# Patient Record
Sex: Female | Born: 1950 | Race: White | Hispanic: No | Marital: Married | Smoking: Never smoker
Health system: Southern US, Community
[De-identification: ages and names within clinical notes are randomized; demographics above are authoritative.]

## PROBLEM LIST (undated history)

## (undated) DIAGNOSIS — N3946 Mixed incontinence: Secondary | ICD-10-CM

## (undated) DIAGNOSIS — C801 Malignant (primary) neoplasm, unspecified: Secondary | ICD-10-CM

## (undated) DIAGNOSIS — E785 Hyperlipidemia, unspecified: Secondary | ICD-10-CM

## (undated) DIAGNOSIS — B009 Herpesviral infection, unspecified: Secondary | ICD-10-CM

## (undated) DIAGNOSIS — D649 Anemia, unspecified: Secondary | ICD-10-CM

## (undated) DIAGNOSIS — G709 Myoneural disorder, unspecified: Secondary | ICD-10-CM

## (undated) DIAGNOSIS — T7840XA Allergy, unspecified, initial encounter: Secondary | ICD-10-CM

## (undated) DIAGNOSIS — I10 Essential (primary) hypertension: Secondary | ICD-10-CM

## (undated) DIAGNOSIS — Z87442 Personal history of urinary calculi: Secondary | ICD-10-CM

## (undated) DIAGNOSIS — Z8719 Personal history of other diseases of the digestive system: Secondary | ICD-10-CM

## (undated) DIAGNOSIS — N201 Calculus of ureter: Secondary | ICD-10-CM

## (undated) DIAGNOSIS — D72829 Elevated white blood cell count, unspecified: Secondary | ICD-10-CM

## (undated) DIAGNOSIS — E0590 Thyrotoxicosis, unspecified without thyrotoxic crisis or storm: Secondary | ICD-10-CM

## (undated) DIAGNOSIS — N281 Cyst of kidney, acquired: Secondary | ICD-10-CM

## (undated) HISTORY — DX: Mixed incontinence: N39.46

## (undated) HISTORY — DX: Elevated white blood cell count, unspecified: D72.829

## (undated) HISTORY — DX: Essential (primary) hypertension: I10

## (undated) HISTORY — DX: Herpesviral infection, unspecified: B00.9

## (undated) HISTORY — DX: Myoneural disorder, unspecified: G70.9

## (undated) HISTORY — PX: SPINE SURGERY: SHX786

## (undated) HISTORY — DX: Thyrotoxicosis, unspecified without thyrotoxic crisis or storm: E05.90

## (undated) HISTORY — DX: Malignant (primary) neoplasm, unspecified: C80.1

## (undated) HISTORY — DX: Hyperlipidemia, unspecified: E78.5

## (undated) HISTORY — DX: Allergy, unspecified, initial encounter: T78.40XA

## (undated) HISTORY — DX: Personal history of urinary calculi: Z87.442

## (undated) HISTORY — PX: COLONOSCOPY: SHX174

## (undated) SURGERY — CYSTOSCOPY, WITH CALCULUS REMOVAL USING BASKET
Anesthesia: Choice | Laterality: Right

---
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
## 1985-12-09 HISTORY — PX: VAGINAL HYSTERECTOMY: SUR661

## 1992-12-09 HISTORY — PX: BREAST REDUCTION SURGERY: SHX8

## 1993-12-09 HISTORY — PX: REDUCTION MAMMAPLASTY: SUR839

## 1998-12-09 HISTORY — PX: CERVICAL FUSION: SHX112

## 1999-04-25 ENCOUNTER — Encounter: Payer: Self-pay | Admitting: Emergency Medicine

## 1999-04-25 ENCOUNTER — Emergency Department (HOSPITAL_COMMUNITY): Admission: EM | Admit: 1999-04-25 | Discharge: 1999-04-25 | Payer: Self-pay | Admitting: Emergency Medicine

## 1999-06-14 ENCOUNTER — Inpatient Hospital Stay (HOSPITAL_COMMUNITY): Admission: RE | Admit: 1999-06-14 | Discharge: 1999-06-14 | Payer: Self-pay | Admitting: Neurological Surgery

## 1999-06-14 ENCOUNTER — Encounter: Payer: Self-pay | Admitting: Neurological Surgery

## 1999-12-14 ENCOUNTER — Encounter: Payer: Self-pay | Admitting: Family Medicine

## 1999-12-14 ENCOUNTER — Ambulatory Visit (HOSPITAL_COMMUNITY): Admission: RE | Admit: 1999-12-14 | Discharge: 1999-12-14 | Payer: Self-pay | Admitting: Family Medicine

## 2000-05-23 ENCOUNTER — Other Ambulatory Visit: Admission: RE | Admit: 2000-05-23 | Discharge: 2000-05-23 | Payer: Self-pay | Admitting: Family Medicine

## 2001-07-06 ENCOUNTER — Encounter: Payer: Self-pay | Admitting: Family Medicine

## 2001-07-06 ENCOUNTER — Ambulatory Visit (HOSPITAL_COMMUNITY): Admission: RE | Admit: 2001-07-06 | Discharge: 2001-07-06 | Payer: Self-pay | Admitting: Family Medicine

## 2001-07-13 LAB — FECAL OCCULT BLOOD, GUAIAC: Fecal Occult Blood: NEGATIVE

## 2002-07-28 ENCOUNTER — Ambulatory Visit (HOSPITAL_COMMUNITY): Admission: RE | Admit: 2002-07-28 | Discharge: 2002-07-28 | Payer: Self-pay | Admitting: Family Medicine

## 2002-07-28 ENCOUNTER — Encounter: Payer: Self-pay | Admitting: Family Medicine

## 2003-08-29 ENCOUNTER — Ambulatory Visit (HOSPITAL_COMMUNITY): Admission: RE | Admit: 2003-08-29 | Discharge: 2003-08-29 | Payer: Self-pay | Admitting: Family Medicine

## 2003-08-29 ENCOUNTER — Encounter: Payer: Self-pay | Admitting: Family Medicine

## 2003-12-10 HISTORY — PX: CERVICAL FUSION: SHX112

## 2004-04-26 ENCOUNTER — Ambulatory Visit (HOSPITAL_COMMUNITY): Admission: RE | Admit: 2004-04-26 | Discharge: 2004-04-26 | Payer: Self-pay | Admitting: Family Medicine

## 2004-05-15 ENCOUNTER — Ambulatory Visit (HOSPITAL_COMMUNITY): Admission: RE | Admit: 2004-05-15 | Discharge: 2004-05-16 | Payer: Self-pay | Admitting: Neurological Surgery

## 2004-08-20 ENCOUNTER — Other Ambulatory Visit: Admission: RE | Admit: 2004-08-20 | Discharge: 2004-08-20 | Payer: Self-pay | Admitting: Internal Medicine

## 2004-08-20 ENCOUNTER — Encounter: Payer: Self-pay | Admitting: Family Medicine

## 2004-08-20 LAB — CONVERTED CEMR LAB: Pap Smear: NORMAL

## 2004-12-20 ENCOUNTER — Ambulatory Visit: Payer: Self-pay | Admitting: Family Medicine

## 2004-12-21 ENCOUNTER — Encounter: Payer: Self-pay | Admitting: Cardiology

## 2004-12-21 ENCOUNTER — Inpatient Hospital Stay (HOSPITAL_COMMUNITY): Admission: EM | Admit: 2004-12-21 | Discharge: 2004-12-24 | Payer: Self-pay | Admitting: Podiatry

## 2004-12-21 ENCOUNTER — Ambulatory Visit: Payer: Self-pay | Admitting: Internal Medicine

## 2004-12-27 ENCOUNTER — Ambulatory Visit: Payer: Self-pay | Admitting: Family Medicine

## 2005-02-27 ENCOUNTER — Ambulatory Visit: Payer: Self-pay | Admitting: Family Medicine

## 2005-08-27 ENCOUNTER — Ambulatory Visit: Payer: Self-pay | Admitting: Family Medicine

## 2005-09-02 ENCOUNTER — Ambulatory Visit (HOSPITAL_COMMUNITY): Admission: RE | Admit: 2005-09-02 | Discharge: 2005-09-02 | Payer: Self-pay | Admitting: Family Medicine

## 2005-10-15 ENCOUNTER — Ambulatory Visit: Payer: Self-pay | Admitting: Family Medicine

## 2006-10-21 ENCOUNTER — Ambulatory Visit: Payer: Self-pay | Admitting: Family Medicine

## 2006-10-22 ENCOUNTER — Ambulatory Visit (HOSPITAL_COMMUNITY): Admission: RE | Admit: 2006-10-22 | Discharge: 2006-10-22 | Payer: Self-pay | Admitting: Family Medicine

## 2006-10-28 ENCOUNTER — Ambulatory Visit: Payer: Self-pay | Admitting: Family Medicine

## 2006-12-24 ENCOUNTER — Ambulatory Visit: Payer: Self-pay | Admitting: Gastroenterology

## 2007-01-09 ENCOUNTER — Ambulatory Visit: Payer: Self-pay | Admitting: Gastroenterology

## 2007-01-09 LAB — HM COLONOSCOPY: HM Colonoscopy: NORMAL

## 2007-02-12 ENCOUNTER — Ambulatory Visit: Payer: Self-pay | Admitting: Family Medicine

## 2007-02-12 LAB — CONVERTED CEMR LAB
Eosinophils Absolute: 0.3 10*3/uL (ref 0.0–0.6)
Eosinophils Relative: 3.6 % (ref 0.0–5.0)
HCT: 33.8 % — ABNORMAL LOW (ref 36.0–46.0)
Lymphocytes Relative: 30.4 % (ref 12.0–46.0)
MCV: 89.1 fL (ref 78.0–100.0)
Monocytes Absolute: 0.4 10*3/uL (ref 0.2–0.7)
Neutro Abs: 4.6 10*3/uL (ref 1.4–7.7)
Neutrophils Relative %: 59.5 % (ref 43.0–77.0)
WBC: 7.7 10*3/uL (ref 4.5–10.5)

## 2007-03-19 ENCOUNTER — Inpatient Hospital Stay (HOSPITAL_COMMUNITY): Admission: EM | Admit: 2007-03-19 | Discharge: 2007-03-21 | Payer: Self-pay | Admitting: Emergency Medicine

## 2007-03-19 ENCOUNTER — Ambulatory Visit: Payer: Self-pay | Admitting: Internal Medicine

## 2007-03-22 ENCOUNTER — Emergency Department (HOSPITAL_COMMUNITY): Admission: EM | Admit: 2007-03-22 | Discharge: 2007-03-22 | Payer: Self-pay | Admitting: Emergency Medicine

## 2007-03-22 ENCOUNTER — Ambulatory Visit: Payer: Self-pay | Admitting: Psychiatry

## 2007-03-22 ENCOUNTER — Inpatient Hospital Stay (HOSPITAL_COMMUNITY): Admission: AD | Admit: 2007-03-22 | Discharge: 2007-03-25 | Payer: Self-pay | Admitting: Psychiatry

## 2007-03-27 ENCOUNTER — Encounter: Payer: Self-pay | Admitting: Family Medicine

## 2007-03-30 ENCOUNTER — Ambulatory Visit: Payer: Self-pay | Admitting: Endocrinology

## 2007-03-31 ENCOUNTER — Ambulatory Visit: Payer: Self-pay | Admitting: Family Medicine

## 2007-03-31 LAB — CONVERTED CEMR LAB
AST: 26 units/L (ref 0–37)
BUN: 13 mg/dL (ref 6–23)
Basophils Absolute: 0 10*3/uL (ref 0.0–0.1)
Calcium: 9.3 mg/dL (ref 8.4–10.5)
Eosinophils Absolute: 0.2 10*3/uL (ref 0.0–0.6)
GFR calc Af Amer: 83 mL/min
Glucose, Bld: 125 mg/dL — ABNORMAL HIGH (ref 70–99)
MCHC: 34.6 g/dL (ref 30.0–36.0)
MCV: 89.2 fL (ref 78.0–100.0)
Neutrophils Relative %: 75.3 % (ref 43.0–77.0)
Platelets: 784 10*3/uL — ABNORMAL HIGH (ref 150–400)
Potassium: 3.3 meq/L — ABNORMAL LOW (ref 3.5–5.1)
RBC: 3.98 M/uL (ref 3.87–5.11)

## 2007-04-02 ENCOUNTER — Ambulatory Visit: Payer: Self-pay | Admitting: Family Medicine

## 2007-04-03 ENCOUNTER — Encounter: Payer: Self-pay | Admitting: Family Medicine

## 2007-04-04 ENCOUNTER — Emergency Department (HOSPITAL_COMMUNITY): Admission: EM | Admit: 2007-04-04 | Discharge: 2007-04-05 | Payer: Self-pay | Admitting: Emergency Medicine

## 2007-04-06 ENCOUNTER — Ambulatory Visit: Payer: Self-pay | Admitting: Family Medicine

## 2007-04-06 ENCOUNTER — Telehealth: Payer: Self-pay | Admitting: Family Medicine

## 2007-04-06 LAB — CONVERTED CEMR LAB
ALT: 26 units/L (ref 0–40)
Albumin: 3.4 g/dL — ABNORMAL LOW (ref 3.5–5.2)
Alkaline Phosphatase: 80 units/L (ref 39–117)
BUN: 12 mg/dL (ref 6–23)
Basophils Absolute: 0.1 10*3/uL (ref 0.0–0.1)
Basophils Relative: 1.2 % — ABNORMAL HIGH (ref 0.0–1.0)
CO2: 28 meq/L (ref 19–32)
CRP, High Sensitivity: 27 — ABNORMAL HIGH (ref 0.00–5.00)
Calcium: 9 mg/dL (ref 8.4–10.5)
Eosinophils Absolute: 0.2 10*3/uL (ref 0.0–0.6)
Ferritin: 171.4 ng/mL (ref 10.0–291.0)
GFR calc Af Amer: 66 mL/min
GFR calc non Af Amer: 55 mL/min
HCV Ab: NEGATIVE
Hep A IgM: NEGATIVE
Hepatitis B Surface Ag: NEGATIVE
Lymphocytes Relative: 21.7 % (ref 12.0–46.0)
MCHC: 34.5 g/dL (ref 30.0–36.0)
Monocytes Relative: 7.6 % (ref 3.0–11.0)
Neutro Abs: 8 10*3/uL — ABNORMAL HIGH (ref 1.4–7.7)
Platelets: 589 10*3/uL — ABNORMAL HIGH (ref 150–400)

## 2007-04-09 ENCOUNTER — Telehealth: Payer: Self-pay | Admitting: Family Medicine

## 2007-04-09 DIAGNOSIS — D473 Essential (hemorrhagic) thrombocythemia: Secondary | ICD-10-CM

## 2007-04-09 DIAGNOSIS — D72829 Elevated white blood cell count, unspecified: Secondary | ICD-10-CM | POA: Insufficient documentation

## 2007-04-10 ENCOUNTER — Ambulatory Visit: Payer: Self-pay | Admitting: Oncology

## 2007-04-10 ENCOUNTER — Encounter: Payer: Self-pay | Admitting: Family Medicine

## 2007-04-14 ENCOUNTER — Encounter: Payer: Self-pay | Admitting: Family Medicine

## 2007-04-14 ENCOUNTER — Other Ambulatory Visit: Admission: RE | Admit: 2007-04-14 | Discharge: 2007-04-14 | Payer: Self-pay | Admitting: Oncology

## 2007-04-14 ENCOUNTER — Encounter: Payer: Self-pay | Admitting: Oncology

## 2007-04-14 LAB — CBC WITH DIFFERENTIAL (CANCER CENTER ONLY)
BASO%: 0.5 % (ref 0.0–2.0)
EOS%: 2 % (ref 0.0–7.0)
LYMPH%: 27.4 % (ref 14.0–48.0)
MCHC: 33.5 g/dL (ref 32.0–36.0)
MCV: 90 fL (ref 81–101)
MONO#: 0.5 10*3/uL (ref 0.1–0.9)
Platelets: 543 10*3/uL — ABNORMAL HIGH (ref 145–400)
RDW: 11.5 % (ref 10.5–14.6)
WBC: 9.3 10*3/uL (ref 3.9–10.0)

## 2007-04-14 LAB — MORPHOLOGY - CHCC SATELLITE: PLT EST ~~LOC~~: 714

## 2007-04-16 ENCOUNTER — Ambulatory Visit: Payer: Self-pay | Admitting: Family Medicine

## 2007-04-16 DIAGNOSIS — E876 Hypokalemia: Secondary | ICD-10-CM | POA: Insufficient documentation

## 2007-04-16 DIAGNOSIS — D649 Anemia, unspecified: Secondary | ICD-10-CM | POA: Insufficient documentation

## 2007-04-16 LAB — IRON AND TIBC
TIBC: 335 ug/dL (ref 250–470)
UIBC: 257 ug/dL

## 2007-04-16 LAB — CONVERTED CEMR LAB
Basophils Absolute: 0.1 10*3/uL (ref 0.0–0.1)
Eosinophils Absolute: 0.2 10*3/uL (ref 0.0–0.6)
MCHC: 35.1 g/dL (ref 30.0–36.0)
MCV: 89.7 fL (ref 78.0–100.0)
Monocytes Absolute: 0.5 10*3/uL (ref 0.2–0.7)
Monocytes Relative: 5.8 % (ref 3.0–11.0)
Potassium: 4.5 meq/L (ref 3.5–5.1)
RBC: 3.66 M/uL — ABNORMAL LOW (ref 3.87–5.11)
RDW: 13.2 % (ref 11.5–14.6)

## 2007-04-16 LAB — COMPREHENSIVE METABOLIC PANEL
ALT: 22 U/L (ref 0–35)
AST: 19 U/L (ref 0–37)
Albumin: 3.3 g/dL — ABNORMAL LOW (ref 3.5–5.2)
Alkaline Phosphatase: 87 U/L (ref 39–117)
Glucose, Bld: 120 mg/dL — ABNORMAL HIGH (ref 70–99)
Potassium: 4.4 mEq/L (ref 3.5–5.3)
Sodium: 141 mEq/L (ref 135–145)
Total Protein: 5.9 g/dL — ABNORMAL LOW (ref 6.0–8.3)

## 2007-04-16 LAB — RETICULOCYTES (CHCC)
ABS Retic: 76.4 10*3/uL (ref 19.0–186.0)
RBC.: 3.82 MIL/uL — ABNORMAL LOW (ref 3.87–5.11)

## 2007-05-06 LAB — CBC WITH DIFFERENTIAL (CANCER CENTER ONLY)
BASO#: 0.1 10*3/uL (ref 0.0–0.2)
EOS%: 4.1 % (ref 0.0–7.0)
HCT: 33.3 % — ABNORMAL LOW (ref 34.8–46.6)
HGB: 11.2 g/dL — ABNORMAL LOW (ref 11.6–15.9)
LYMPH#: 3.5 10*3/uL — ABNORMAL HIGH (ref 0.9–3.3)
MONO#: 0.4 10*3/uL (ref 0.1–0.9)
NEUT#: 3.7 10*3/uL (ref 1.5–6.5)
NEUT%: 46.3 % (ref 39.6–80.0)
RBC: 3.8 10*6/uL (ref 3.70–5.32)
WBC: 7.9 10*3/uL (ref 3.9–10.0)

## 2007-08-11 ENCOUNTER — Ambulatory Visit: Payer: Self-pay | Admitting: Oncology

## 2007-08-12 ENCOUNTER — Encounter: Payer: Self-pay | Admitting: Family Medicine

## 2007-08-12 LAB — CBC WITH DIFFERENTIAL (CANCER CENTER ONLY)
BASO%: 0.8 % (ref 0.0–2.0)
EOS%: 3.6 % (ref 0.0–7.0)
HCT: 36.4 % (ref 34.8–46.6)
LYMPH#: 3.3 10*3/uL (ref 0.9–3.3)
MCHC: 33.8 g/dL (ref 32.0–36.0)
MONO#: 0.5 10*3/uL (ref 0.1–0.9)
NEUT#: 4.1 10*3/uL (ref 1.5–6.5)
NEUT%: 49.9 % (ref 39.6–80.0)
Platelets: 490 10*3/uL — ABNORMAL HIGH (ref 145–400)
RDW: 11.8 % (ref 10.5–14.6)
WBC: 8.2 10*3/uL (ref 3.9–10.0)

## 2007-09-23 ENCOUNTER — Encounter: Payer: Self-pay | Admitting: Family Medicine

## 2007-09-23 DIAGNOSIS — E0590 Thyrotoxicosis, unspecified without thyrotoxic crisis or storm: Secondary | ICD-10-CM

## 2007-09-23 DIAGNOSIS — J45909 Unspecified asthma, uncomplicated: Secondary | ICD-10-CM | POA: Insufficient documentation

## 2007-09-23 DIAGNOSIS — E781 Pure hyperglyceridemia: Secondary | ICD-10-CM | POA: Insufficient documentation

## 2007-09-23 DIAGNOSIS — B009 Herpesviral infection, unspecified: Secondary | ICD-10-CM

## 2007-09-23 DIAGNOSIS — J309 Allergic rhinitis, unspecified: Secondary | ICD-10-CM | POA: Insufficient documentation

## 2007-09-23 DIAGNOSIS — I10 Essential (primary) hypertension: Secondary | ICD-10-CM

## 2007-10-28 ENCOUNTER — Ambulatory Visit: Payer: Self-pay | Admitting: Family Medicine

## 2007-10-30 LAB — CONVERTED CEMR LAB
ALT: 19 units/L (ref 0–35)
AST: 21 units/L (ref 0–37)
Albumin: 3.9 g/dL (ref 3.5–5.2)
Calcium: 9.7 mg/dL (ref 8.4–10.5)
Chloride: 100 meq/L (ref 96–112)
Cholesterol: 236 mg/dL (ref 0–200)
Creatinine, Ser: 0.9 mg/dL (ref 0.4–1.2)
Direct LDL: 124.3 mg/dL
GFR calc non Af Amer: 69 mL/min
Sodium: 137 meq/L (ref 135–145)
Total Bilirubin: 0.9 mg/dL (ref 0.3–1.2)
VLDL: 71 mg/dL — ABNORMAL HIGH (ref 0–40)

## 2007-11-13 ENCOUNTER — Encounter: Payer: Self-pay | Admitting: Family Medicine

## 2007-11-13 ENCOUNTER — Ambulatory Visit (HOSPITAL_COMMUNITY): Admission: RE | Admit: 2007-11-13 | Discharge: 2007-11-13 | Payer: Self-pay | Admitting: Family Medicine

## 2007-11-17 ENCOUNTER — Encounter (INDEPENDENT_AMBULATORY_CARE_PROVIDER_SITE_OTHER): Payer: Self-pay | Admitting: *Deleted

## 2007-11-23 ENCOUNTER — Encounter (INDEPENDENT_AMBULATORY_CARE_PROVIDER_SITE_OTHER): Payer: Self-pay | Admitting: *Deleted

## 2007-12-22 ENCOUNTER — Encounter: Payer: Self-pay | Admitting: Family Medicine

## 2007-12-23 ENCOUNTER — Ambulatory Visit: Payer: Self-pay | Admitting: Family Medicine

## 2008-03-21 ENCOUNTER — Ambulatory Visit: Payer: Self-pay | Admitting: Oncology

## 2008-03-22 ENCOUNTER — Encounter: Payer: Self-pay | Admitting: Family Medicine

## 2008-03-22 LAB — CBC WITH DIFFERENTIAL (CANCER CENTER ONLY)
BASO%: 0.6 % (ref 0.0–2.0)
Eosinophils Absolute: 0.3 10*3/uL (ref 0.0–0.5)
HCT: 36.5 % (ref 34.8–46.6)
LYMPH%: 33.3 % (ref 14.0–48.0)
MCH: 28.8 pg (ref 26.0–34.0)
MCV: 84 fL (ref 81–101)
MONO%: 4.9 % (ref 0.0–13.0)
NEUT%: 57.4 % (ref 39.6–80.0)
Platelets: 402 10*3/uL — ABNORMAL HIGH (ref 145–400)
RDW: 12.2 % (ref 10.5–14.6)

## 2008-06-20 ENCOUNTER — Encounter: Payer: Self-pay | Admitting: Family Medicine

## 2008-08-08 ENCOUNTER — Encounter: Payer: Self-pay | Admitting: Family Medicine

## 2008-08-25 ENCOUNTER — Encounter (INDEPENDENT_AMBULATORY_CARE_PROVIDER_SITE_OTHER): Payer: Self-pay | Admitting: *Deleted

## 2008-11-08 ENCOUNTER — Ambulatory Visit: Payer: Self-pay | Admitting: Family Medicine

## 2008-11-08 DIAGNOSIS — E039 Hypothyroidism, unspecified: Secondary | ICD-10-CM

## 2008-11-11 LAB — CONVERTED CEMR LAB
ALT: 17 units/L (ref 0–35)
AST: 24 units/L (ref 0–37)
Albumin: 3.7 g/dL (ref 3.5–5.2)
Alkaline Phosphatase: 67 units/L (ref 39–117)
BUN: 16 mg/dL (ref 6–23)
Basophils Relative: 0.4 % (ref 0.0–3.0)
CO2: 27 meq/L (ref 19–32)
Chloride: 103 meq/L (ref 96–112)
Creatinine, Ser: 0.8 mg/dL (ref 0.4–1.2)
Direct LDL: 110.4 mg/dL
Eosinophils Absolute: 0.2 10*3/uL (ref 0.0–0.7)
Eosinophils Relative: 2.8 % (ref 0.0–5.0)
GFR calc non Af Amer: 79 mL/min
Glucose, Bld: 92 mg/dL (ref 70–99)
HDL: 63.2 mg/dL (ref >39.0)
Lymphocytes Relative: 28.2 % (ref 12.0–46.0)
MCV: 88 fL (ref 78.0–100.0)
Monocytes Relative: 4.9 % (ref 3.0–12.0)
Neutrophils Relative %: 63.7 % (ref 43.0–77.0)
Platelets: 441 10*3/uL — ABNORMAL HIGH (ref 150–400)
Potassium: 3.7 meq/L (ref 3.5–5.1)
RBC: 4.02 M/uL (ref 3.87–5.11)
Total CHOL/HDL Ratio: 3.9
Triglycerides: 488 mg/dL (ref 0–149)
VLDL: 98 mg/dL — ABNORMAL HIGH (ref 0–40)
WBC: 8.1 10*3/uL (ref 4.5–10.5)

## 2008-11-22 ENCOUNTER — Ambulatory Visit (HOSPITAL_COMMUNITY): Admission: RE | Admit: 2008-11-22 | Discharge: 2008-11-22 | Payer: Self-pay | Admitting: Family Medicine

## 2008-11-23 ENCOUNTER — Encounter (INDEPENDENT_AMBULATORY_CARE_PROVIDER_SITE_OTHER): Payer: Self-pay | Admitting: *Deleted

## 2008-12-22 ENCOUNTER — Encounter: Payer: Self-pay | Admitting: Family Medicine

## 2009-02-07 ENCOUNTER — Ambulatory Visit: Payer: Self-pay | Admitting: Family Medicine

## 2009-10-30 ENCOUNTER — Telehealth (INDEPENDENT_AMBULATORY_CARE_PROVIDER_SITE_OTHER): Payer: Self-pay | Admitting: *Deleted

## 2009-11-09 ENCOUNTER — Ambulatory Visit: Payer: Self-pay | Admitting: Family Medicine

## 2009-11-10 LAB — CONVERTED CEMR LAB
ALT: 20 units/L (ref 0–35)
AST: 21 units/L (ref 0–37)
Albumin: 4.1 g/dL (ref 3.5–5.2)
Chloride: 100 meq/L (ref 96–112)
Direct LDL: 109.9 mg/dL
Eosinophils Relative: 2.6 % (ref 0.0–5.0)
GFR calc non Af Amer: 44.57 mL/min (ref >60)
Glucose, Bld: 99 mg/dL (ref 70–99)
HCT: 38.6 % (ref 36.0–46.0)
HDL: 54.2 mg/dL (ref >39.00)
Hemoglobin: 13.2 g/dL (ref 12.0–15.0)
Lymphs Abs: 2.4 10*3/uL (ref 0.7–4.0)
MCV: 87.5 fL (ref 78.0–100.0)
Monocytes Absolute: 0.5 10*3/uL (ref 0.1–1.0)
Monocytes Relative: 6 % (ref 3.0–12.0)
Neutro Abs: 5 10*3/uL (ref 1.4–7.7)
Potassium: 3.6 meq/L (ref 3.5–5.1)
Sodium: 137 meq/L (ref 135–145)
Total Protein: 7.5 g/dL (ref 6.0–8.3)
VLDL: 49.4 mg/dL — ABNORMAL HIGH (ref 0.0–40.0)
WBC: 8.2 10*3/uL (ref 4.5–10.5)

## 2009-11-23 ENCOUNTER — Ambulatory Visit (HOSPITAL_COMMUNITY): Admission: RE | Admit: 2009-11-23 | Discharge: 2009-11-23 | Payer: Self-pay | Admitting: Family Medicine

## 2009-11-27 ENCOUNTER — Encounter (INDEPENDENT_AMBULATORY_CARE_PROVIDER_SITE_OTHER): Payer: Self-pay | Admitting: *Deleted

## 2009-12-14 ENCOUNTER — Ambulatory Visit: Payer: Self-pay | Admitting: Family Medicine

## 2009-12-25 ENCOUNTER — Telehealth: Payer: Self-pay | Admitting: Family Medicine

## 2009-12-29 ENCOUNTER — Telehealth: Payer: Self-pay | Admitting: Family Medicine

## 2010-01-23 ENCOUNTER — Telehealth: Payer: Self-pay | Admitting: Family Medicine

## 2010-09-04 ENCOUNTER — Telehealth: Payer: Self-pay | Admitting: Family Medicine

## 2010-11-05 ENCOUNTER — Encounter: Payer: Self-pay | Admitting: Family Medicine

## 2010-11-09 ENCOUNTER — Telehealth (INDEPENDENT_AMBULATORY_CARE_PROVIDER_SITE_OTHER): Payer: Self-pay | Admitting: *Deleted

## 2010-11-12 ENCOUNTER — Ambulatory Visit: Payer: Self-pay | Admitting: Family Medicine

## 2010-11-13 LAB — CONVERTED CEMR LAB
AST: 25 units/L (ref 0–37)
Alkaline Phosphatase: 92 units/L (ref 39–117)
Bilirubin, Direct: 0.1 mg/dL (ref 0.0–0.3)
CO2: 27 meq/L (ref 19–32)
Calcium: 9.7 mg/dL (ref 8.4–10.5)
Creatinine, Ser: 1.1 mg/dL (ref 0.4–1.2)
Direct LDL: 143.1 mg/dL
Eosinophils Relative: 2.9 % (ref 0.0–5.0)
Glucose, Bld: 95 mg/dL (ref 70–99)
HCT: 37.2 % (ref 36.0–46.0)
Lymphs Abs: 3.5 10*3/uL (ref 0.7–4.0)
MCHC: 34.5 g/dL (ref 30.0–36.0)
MCV: 89.4 fL (ref 78.0–100.0)
Monocytes Absolute: 0.6 10*3/uL (ref 0.1–1.0)
Platelets: 483 10*3/uL — ABNORMAL HIGH (ref 150.0–400.0)
RDW: 12 % (ref 11.5–14.6)
TSH: 7.88 microintl units/mL — ABNORMAL HIGH (ref 0.35–5.50)
Total Bilirubin: 0.9 mg/dL (ref 0.3–1.2)
Total CHOL/HDL Ratio: 4
WBC: 9.9 10*3/uL (ref 4.5–10.5)

## 2010-12-11 ENCOUNTER — Ambulatory Visit
Admission: RE | Admit: 2010-12-11 | Discharge: 2010-12-11 | Payer: Self-pay | Source: Home / Self Care | Attending: Family Medicine | Admitting: Family Medicine

## 2010-12-11 DIAGNOSIS — N3946 Mixed incontinence: Secondary | ICD-10-CM | POA: Insufficient documentation

## 2010-12-19 ENCOUNTER — Telehealth: Payer: Self-pay | Admitting: Family Medicine

## 2010-12-19 ENCOUNTER — Ambulatory Visit (HOSPITAL_COMMUNITY)
Admission: RE | Admit: 2010-12-19 | Discharge: 2010-12-19 | Payer: Self-pay | Source: Home / Self Care | Attending: Family Medicine | Admitting: Family Medicine

## 2010-12-20 ENCOUNTER — Encounter: Payer: Self-pay | Admitting: Family Medicine

## 2010-12-24 ENCOUNTER — Encounter (INDEPENDENT_AMBULATORY_CARE_PROVIDER_SITE_OTHER): Payer: Self-pay | Admitting: *Deleted

## 2010-12-31 ENCOUNTER — Telehealth: Payer: Self-pay | Admitting: Family Medicine

## 2011-01-01 ENCOUNTER — Encounter: Payer: Self-pay | Admitting: Family Medicine

## 2011-01-08 NOTE — Progress Notes (Signed)
----   Converted from flag ---- ---- 11/08/2010 9:47 PM, Colon Flattery Tower MD wrote: please check lipid/hepatic/ renal/ cbc with diff and tsh for 272, 401.1 and 244.9 and anemia  thanks  ---- 11/07/2010 10:50 AM, Liane Comber CMA (AAMA) wrote: Lab orders please! Good Morning! This pt is scheduled for cpx labs Monday, which labs to draw and dx codes to use? Thanks Tasha ------------------------------

## 2011-01-08 NOTE — Progress Notes (Signed)
Summary: Rx Avalide backordered  Phone Note Refill Request Call back at [phone] Message from:  [facility] on December 25, 2009 10:00 AM  Refills Requested: Medication #1:  AVALIDE 150-12.5 MG  TABS one by mouth once daily Received faxed form form [facility].  All strengths of Avalide are back ordered.  Please advise.  Alternate medication?  Call Rx in to local pharmacy?  Form in your IN box   Method Requested: Fax to Mail Away Pharmacy Initial call taken by: Linde Gillis CMA Duncan Dull),  December 25, 2009 10:02 AM  Follow-up for Phone Call        will need to change to hyzaar until it is availible form done and in nurse in box  Follow-up by: Judith Part MD,  December 25, 2009 11:12 AM  Additional Follow-up for Phone Call Additional follow up Details #1::        Form faxed. Additional Follow-up by: Lowella Petties CMA,  December 25, 2009 11:37 AM    New/Updated Medications: HYZAAR 50-12.5 MG TABS (LOSARTAN POTASSIUM-HCTZ) 1 by mouth once daily Prescriptions: HYZAAR 50-12.5 MG TABS (LOSARTAN POTASSIUM-HCTZ) 1 by mouth once daily  #90 x 1   Entered and Authorized by:   Judith Part MD   Signed by:   Judith Part MD on 12/25/2009   Method used:   Historical   RxID:   [id number]

## 2011-01-08 NOTE — Progress Notes (Signed)
Summary: Adan Sis is on back order  Phone Note From Pharmacy Message from:  Fax from Pharmacy  [facility]er[facility] Summary of Call: Form advising that avalide is on back order is on your shelf.  They are asking if you want to change to losartan. Initial call taken by: Lowella Petties CMA,  January 23, 2010 8:35 AM  Follow-up for Phone Call        I have on her chart that we have already changed to hyzaar... see prev phone note Follow-up by: Judith Part MD,  January 23, 2010 9:01 AM  Additional Follow-up for Phone Call Additional follow up Details #1::        [facility] [phone] spoke with [facility] Ref # [id number]. Chelsea did find where faxed Hyzaar (Losartan) rx on 12/25/09. Said to disregard this Julious Payer LPN  January 23, 2010 9:53 AM

## 2011-01-08 NOTE — Progress Notes (Signed)
Summary: Clarification on Innopran   Phone Note From Pharmacy   Caller: [facility] Call For: Dr. Milinda Antis  Summary of Call: Received a faxed form needing clarification on Innopran.  Form in your In box Initial call taken by: Linde Gillis CMA Duncan Dull),  December 29, 2009 9:51 AM  Follow-up for Phone Call        form done and in nurse in box  Follow-up by: Judith Part MD,  December 29, 2009 1:17 PM  Additional Follow-up for Phone Call Additional follow up Details #1::        Form faxed. Additional Follow-up by: Lowella Petties CMA,  December 29, 2009 2:57 PM

## 2011-01-08 NOTE — Letter (Signed)
Summary: Sharin Grave MD  Sharin Grave MD   Imported By: Lanelle Bal 11/10/2010 09:30:52  _____________________________________________________________________  External Attachment:    Type:   Image     Comment:   External Document

## 2011-01-08 NOTE — Progress Notes (Signed)
Summary: Rx Acyclovir and Imitrex  Phone Note Refill Request Call back at [phone] Message from:  [facility] on September 04, 2010 9:14 AM  Refills Requested: Medication #1:  IMITREX 100 MG  TABS 1 by mouth once daily as needed migraine as directed  Medication #2:  ACYCLOVIR 400 MG  TABS one by mouth once daily Received faxed refill request from pharmacy. Fax # [phone]   Method Requested: Electronic Initial call taken by: Sydell Axon LPN,  September 04, 2010 9:16 AM  Follow-up for Phone Call        px written on EMR for call in  Follow-up by: Judith Part MD,  September 04, 2010 11:12 AM  Additional Follow-up for Phone Call Additional follow up Details #1::        [phone] spoke with [facility] pharmacy as instructed. Lewanda Rife LPN  September 04, 2010 1:52 PM     New/Updated Medications: ACYCLOVIR 400 MG  TABS (ACYCLOVIR) one by mouth once daily IMITREX 100 MG  TABS (SUMATRIPTAN SUCCINATE) 1 by mouth once daily as needed migraine as directed Prescriptions: IMITREX 100 MG  TABS (SUMATRIPTAN SUCCINATE) 1 by mouth once daily as needed migraine as directed  #27 x 0   Entered and Authorized by:   Judith Part MD   Signed by:   Lewanda Rife LPN on 44/02/4741   Method used:   Telephoned to ...         RxID:   [id number] ACYCLOVIR 400 MG  TABS (ACYCLOVIR) one by mouth once daily  #90 x 0   Entered and Authorized by:   Judith Part MD   Signed by:   Lewanda Rife LPN on 88/41/6606   Method used:   Telephoned to ...         RxID:   [id number]

## 2011-01-10 NOTE — Progress Notes (Signed)
Summary: [facility]  Phone Note Refill Request Message from:  Fax from Pharmacy  Refills Requested: Medication #1:  ACYCLOVIR 400 MG  TABS one by mouth once daily F[facility]ed [facility] is on your shelf.  Initial call taken by: Lowella Petties CMA, AAMA,  December 19, 2010 3:49 PM  Follow-up for Phone Call        Dr Milinda Antis said she gave pt rx on 12/11/10. I spoke with pt and she has already sent rx to [facility]. I called [facility] [phone] spoke with Latoya and she said she does not have rx. Make note on fax and faxed to [phone] and then Latoya will contact pt.Lewanda Rife LPN  December 20, 2010 10:15 AM

## 2011-01-10 NOTE — Assessment & Plan Note (Signed)
Summary: CPX/CLE  R/S FROM 11/14/10   Vital Signs:  Patient profile:   60 year old female Height:      66 inches Weight:      174.25 pounds BMI:     28.23 Temp:     98.3 degrees F oral Pulse rate:   88 / minute Pulse rhythm:   regular BP sitting:   158 / 88  (left arm) Cuff size:   regular  Vitals Entered By: Lewanda Rife LPN (December 11, 2010 8:25 AM)  Serial Vital Signs/Assessments:  Time      Position  BP       Pulse  Resp  Temp     By                     135/80                         Judith Part MD  CC: CPX LMP Hyst   History of Present Illness: here for health mt exam and to rev chronic med problems   wt is down 14 lb with bmi 28-- good  is proud of this  walking and watching carbs -- doing the right thing    HTN - today 158/88 has not been high -- is stressed today  usually runs 128-134/ 88 -- with home cuff and at drugstore   pap 05 had hyst in past-- partial  no gyn symptoms    mam 12/10 -- is due for one  self exam- no lumps   colonosc 2/08- due in 10- y  Td07 flu shot up to date ptx was 08 is interested in shingles shot   nl dexa 08 has one schedule for next endo visit  taking ca and vit D   lipids are up with trig 319 and HDL 77 and LDL 143 - from low 100s no reason why -- has always had hig chol  ? holidays   tsh is high- sees endo -- got meds adjusted   platelet 483- has seen heme for this    Allergies (verified): No Known Drug Allergies  Past History:  Past Surgical History: Last updated: 11/23/07 Neck surgery- 2 herniated disks- fusion plates (63/8756) Hysterectomy- fibroids, bleeding, anemia (1987) Tonsillectomy breast reduction Bacterial meningitis- change on MRI, 60 years old Ct sinus- normal (2000) Dexa- normal (07/2001) C-S disk surgery (04/2004) Admit increased BP,  neg thyroid storm (12/2004) 2D Echo- ok Colonoscopy- ext hemorrhoids (01/2007) Hosp- pneumonia (03/19/07) Hosp- delusional (03/22/07)  Family  History: Last updated: 11-23-2007 Father: deceased- CHF Mother: OP, DM Siblings:  GF DM GM CAD  Social History: Last updated: 11/09/2009 Marital Status: Married Children: 1 son Occupation: Magazine features editor for [employer] non smoker  no alcohol  walking for exercise   Risk Factors: Smoking Status: never (09/23/2007)  Past Medical History: Allergic rhinitis Asthma Hyperlipidemia Hypertension Hyperthyroidism mixed incontinence   Review of Systems General:  Denies fatigue, fever, loss of appetite, and malaise. Eyes:  Denies blurring and eye irritation. CV:  Denies chest pain or discomfort, palpitations, shortness of breath with exertion, and swelling of feet. Resp:  Denies cough and wheezing. GI:  Denies abdominal pain, bloody stools, change in bowel habits, indigestion, and nausea. GU:  Denies abnormal vaginal bleeding and urinary frequency. MS:  Denies joint pain, joint redness, joint swelling, and muscle aches. Derm:  Denies dryness and rash. Neuro:  Denies numbness and tingling. Psych:  mood has been good .  Endo:  Denies cold intolerance, excessive thirst, excessive urination, and heat intolerance. Heme:  Denies abnormal bruising and bleeding.  Physical Exam  General:  overweight but generally well appearing  Head:  normocephalic, atraumatic, and no abnormalities observed.   Eyes:  vision grossly intact, pupils equal, pupils round, and pupils reactive to light.  no conjunctival pallor, injection or icterus  Ears:  R ear normal and L ear normal.   Nose:  no nasal discharge.   Mouth:  pharynx pink and moist.   Neck:  supple with full rom and no masses or thyromegally, no JVD or carotid bruit  Chest Wall:  No deformities, masses, or tenderness noted. Breasts:  some scar tissue felt under scars from prev breast reduction no discrete mass/ nontender/ no skin change or nipple d/c Lungs:  Normal respiratory effort, chest expands symmetrically. Lungs are clear to auscultation,  no crackles or wheezes. Heart:  Normal rate and regular rhythm. S1 and S2 normal without gallop, murmur, click, rub or other extra sounds. Abdomen:  Bowel sounds positive,abdomen soft and non-tender without masses, organomegaly or hernias noted. no renal bruits  Genitalia:  bimanual exam - no M some bladder prolapse noted  normal introitus, no external lesions, no vaginal discharge, and mucosa pink and moist.   Msk:  No deformity or scoliosis noted of thoracic or lumbar spine.  no acute joint changes  Pulses:  R and L carotid,radial,femoral,dorsalis pedis and posterior tibial pulses are full and equal bilaterally Extremities:  No clubbing, cyanosis, edema, or deformity noted with normal full range of motion of all joints.   Neurologic:  sensation intact to light touch, gait normal, and DTRs symmetrical and normal.   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 One by mouth daily Her updated medication list for this problem includes:     Cytomel 25 Mcg Tabs (Liothyronine sodium) .Marland Kitchen... Take one by mouth daily    Synthroid 125 Mcg Tabs (Levothyroxine sodium) .Marland Kitchen... Take 1 tablet by mouth once a day  Problem # 4:  HYPERTENSION (ICD-401.9) Assessment: Unchanged  much better on 2nd check and at home some white coat component  no change in med  Her updated medication list for this problem includes:    Innopran Xl 120 Mg Cp24 (Propranolol hcl sr beads) ..... One by mouth once daily    Hyzaar 50-12.5 Mg Tabs (Losartan potassium-hctz) .Marland Kitchen... 1 by mouth once daily  BP today: 158/88-- 135/80 2nd check Prior BP: 122/84 (11/09/2009)  Labs Reviewed: K+: 4.6 (11/12/2010) Creat: : 1.1 (11/12/2010)   Chol: 271 (11/12/2010)   HDL: 77.00 (11/12/2010)   LDL: DEL (11/08/2008)  TG: 319.0 (11/12/2010)  Problem # 5:  HYPERLIPIDEMIA (ICD-272.4) Assessment: Deteriorated  this is worse- poss due to holiday eating (custard) disc low sat fat diet in detail re check in 3 mo  rev labs with pt in detail  Labs Reviewed: SGOT: 25 (11/12/2010)   SGPT: 30 (11/12/2010)   HDL:77.00 (11/12/2010), 54.20 (11/09/2009)  LDL:DEL (11/08/2008), DEL (10/28/2007)  Chol:271 (11/12/2010), 202 (11/09/2009)  Trig:319.0 (11/12/2010), 247.0 (11/09/2009)  Problem # 6:  THROMBOCYTOSIS (ICD-289.9) Assessment: Comment Only platelets still under 400 has seen heme no symptoms  continue to monitor   Problem # 7:  OTHER SCREENING MAMMOGRAM (ICD-V76.12) Assessment: Comment Only annual mammogram scheduled adv pt to continue regular self breast exams non remarkable breast exam today  Orders: Radiology Referral (Radiology)  Complete Medication List: 1)  Acyclovir 400 Mg Tabs (Acyclovir) .... One by mouth once daily 2)  Detrol La 4 Mg Cp24 (Tolterodine tartrate) .... One by mouth once daily 3)  Premarin 0.3 Mg Tabs (Estrogens conjugated) .... One by mouth once daily 4)  Singulair 10 Mg Tabs (Montelukast sodium) .... One by mouth once daily 5)  Innopran Xl 120 Mg  Cp24 (Propranolol hcl sr beads) .... One by mouth once daily 6)  Imitrex 100 Mg Tabs (Sumatriptan succinate) .Marland Kitchen.. 1 by mouth once daily as needed migraine as directed 7)  Cytomel 25 Mcg Tabs (Liothyronine sodium) .... Take one by mouth daily 8)  Hyzaar 50-12.5 Mg Tabs (Losartan potassium-hctz) .Marland Kitchen.. 1 by mouth once daily 9)  Calcium 500mg  With Vit D ?mg  .... One tablet by mouth three times a day. 10)  Multivitamins Tabs (Multiple vitamin) .... Take 1 tablet by mouth once a day 11)  Synthroid 125 Mcg Tabs (Levothyroxine sodium) .... Take 1 tablet by mouth once a day  Patient Instructions: 1)  call insurance company to check coverage of shingles vaccine and let us know  2)  you can raise your HDL (good cholesterol) by increasing exercise and eating omega 3 fatty acid supplement like fish oil or flax seed oil over the counter 3)  you can lower LDL (bad cholesterol) by limiting saturated fats in diet like red meat, fried foods, egg yolks, fatty breakfast meats, high fat dairy products and shellfish  4)  schedule fasting lab for lipid/ast/alt/ 272 in 3 mo please  5)  we will schedule mammogram at check out  6)  blood pressure is better on 2nd check 7)  we will do urol ref at check out for incontinence Prescriptions: HYZAAR 50-12.5 MG TABS (LOSARTAN POTASSIUM-HCTZ) 1 by mouth once daily  #90 x 3   Entered and Authorized by:   Judith Part MD   Signed by:   Judith Part MD on 12/11/2010   Method used:   Print then Give to Patient   RxID:   [id number] IMITREX 100 MG  TABS (SUMATRIPTAN SUCCINATE) 1 by mouth once daily as needed migraine as directed  #27 x 3   Entered and Authorized by:   Judith Part MD   Signed by:   Judith Part MD on 12/11/2010   Method used:   Print then Give to Patient   RxID:   [id number] INNOPRAN XL 120 MG  CP24 (PROPRANOLOL HCL SR BEADS) one by mouth once daily  #90 x 3   Entered and Authorized by:   Judith Part MD   Signed by:   Judith Part MD on 12/11/2010   Method used:   Print then [employer]  to Patient   RxID:   [id number] SINGULAIR 10 MG  TABS (MONTELUKAST SODIUM) one by mouth once daily  #90 x 3   Entered and Authorized by:   Judith Part MD   Signed by:   Judith Part MD on 12/11/2010   Method used:   Print then Give to Patient   RxID:   [id number] PREMARIN 0.3 MG  TABS (ESTROGENS CONJUGATED) one by mouth once daily  #90 x 3   Entered and Authorized by:   Judith Part MD   Signed by:   Judith Part MD on 12/11/2010   Method used:   Print then Give to Patient   RxID:   [id number] DETROL LA 4 MG  CP24 (TOLTERODINE TARTRATE) one by mouth once daily  #90 x 3   Entered and Authorized by:   Judith Part MD   Signed by:   Judith Part MD on 12/11/2010   Method used:   Print then Give to Patient   RxID:   [id number] ACYCLOVIR 400 MG  TABS (ACYCLOVIR) one by mouth once daily  #90 x 3   Entered and Authorized by:   Judith Part MD   Signed by:   Judith Part MD on 12/11/2010   Method used:   Print then Give to Patient   RxID:   [id number]    Orders Added: 1)  Urology Referral [Urology] 2)  Radiology Referral [Radiology] 3)  Est. Patient 40-64 years [[id number]]   Immunization History:  Influenza Immunization History:    Influenza:  historical received at [facility] (08/09/2010)   Immunization History:  Influenza Immunization History:    Influenza:  Historical received at [facility] (08/09/2010)  Current Allergies (reviewed today): No known allergies    Preventive Care Screening     bimanual pelvic exam 1/11

## 2011-01-10 NOTE — Progress Notes (Signed)
Summary: regarding acyclovir  Phone Note From Pharmacy   Caller: [facility] Summary of Call: Pt was given script for acyclovir 400 mg's.  This is on backorder so pharmacy is asking if they can change to 200 mg's, take 2 a day.  Advised ok, they will dispense # 180.  Initial call taken by: Lowella Petties CMA, AAMA,  December 31, 2010 9:33 AM  Follow-up for Phone Call        that is ok with me if ok with pt  you can advise them of that- thanks  Follow-up by: Judith Part MD,  December 31, 2010 11:28 AM  Additional Follow-up for Phone Call Additional follow up Details #1::        Patient notified as instructed by telephone. Pt said that was OK.Lewanda Rife LPN  December 31, 2010 12:45 PM

## 2011-01-10 NOTE — Letter (Signed)
Summary: Results Follow up Letter  [place] [facility]  [address] [place], [place] [address]   Phone: [phone]  Fax: [phone]    12/24/2010 MRN: [id number]     [name] [address] [place], [place]  [address]    Dear Ms. Scantling,  The following are the results of your recent test(s):  Test         Result    Pap Smear:        Normal _____  Not Normal _____ Comments: ______________________________________________________ Cholesterol: LDL(Bad cholesterol):         Your goal is less than:         HDL (Good cholesterol):       Your goal is more than: Comments:  ______________________________________________________ Mammogram:        Normal __X___  Not Normal _____ Comments:Repeat in 1 year  ___________________________________________________________________ Hemoccult:        Normal _____  Not normal _______ Comments:    _____________________________________________________________________ Other Tests:    We routinely do not discuss normal results over the telephone.  If you desire a copy of the results, or you have any questions about this information we can discuss them at your next office visit.   Sincerely,  Roxy Manns MD

## 2011-01-10 NOTE — Medication Information (Signed)
Summary: A[facility]  [facility]   Imported By: Lanelle Bal 12/26/2010 11:28:38  _____________________________________________________________________  External Attachment:    Type:   Image     Comment:   External Document

## 2011-01-24 NOTE — Letter (Signed)
Summary: [facility] report  [facility] report   Imported By: Kassie Mends 01/11/2011 08:58:42  _____________________________________________________________________  External Attachment:    Type:   Image     Comment:   External Document

## 2011-01-28 ENCOUNTER — Ambulatory Visit (HOSPITAL_BASED_OUTPATIENT_CLINIC_OR_DEPARTMENT_OTHER)
Admission: RE | Admit: 2011-01-28 | Discharge: 2011-01-28 | Disposition: A | Discharge status: Home / Self Care | Payer: 59 | Source: Ambulatory Visit | Attending: Urology | Admitting: Urology

## 2011-01-28 DIAGNOSIS — N201 Calculus of ureter: Secondary | ICD-10-CM | POA: Insufficient documentation

## 2011-01-28 HISTORY — PX: CYSTOSCOPY/RETROGRADE/URETEROSCOPY/STONE EXTRACTION WITH BASKET: SHX5317

## 2011-01-28 LAB — POCT I-STAT 4, (NA,K, GLUC, HGB,HCT): Glucose, Bld: 93 mg/dL (ref 70–99)

## 2011-02-04 NOTE — Op Note (Signed)
  NAME:  [name], [name] NO.:  [id number]  MEDICAL RECORD NO.:  [id number]          PATIENT TYPE:  OUT  LOCATION:  [facility]                          FACILITY:  [facility]  PHYSICIAN:  Jacqueline Spofford I. Patsi Sears, M.D.DATE OF BIRTH:  1951-05-22  DATE OF PROCEDURE: DATE OF DISCHARGE:  12/19/2010                              OPERATIVE REPORT   PREOPERATIVE DIAGNOSIS:  Multiple left ureteral calculi.  POSTOPERATIVE DIAGNOSIS:  Multiple left ureteral calculi.  OPERATIONS:  Cystourethroscopy, retrograde pyelogram with interpretation, right ureteroscopy, laser fractionation of multiple right ureteral stones, basket extraction of stones, and placement of double-J stent (6-French x 24 cm).  SURGEON:  Adithi Gammon I. Patsi Sears, M.D.  ANESTHESIA:  General LMA.  PREPARATION:  After appropriate preanesthesia, the patient is brought to the operating room, placed upon the operating table in dorsal supine position, where general LMA anesthesia was introduced.  She was then re- placed in dorsal lithotomy position, where the pubis was prepped with Betadine solution and draped in usual fashion.  REVIEW OF HISTORY:  This 60 year old female was evaluated in January for urinary incontinence.  She was noted to have acute cystitis, was covered with antibiotics, and was placed for CT stone protocol.  X-rays showed that the patient had severe right hydroureteronephrosis, with multiple right ureteral stones, and is now for stone extraction and double-J stent placement.  BUN is 23, creatinine 1.1.  PROCEDURE:  Cystourethroscopy was accomplished, right retrograde pyelogram was accomplished, which shows multiple stones in the ureter; one of which is easily identified in the lower ureter, another stone is vaguely identified in the mid ureter.  The short ureteroscope was placed into the ureter, and the stone is identified in the lower ureter and lazed.  The stones are basketed into the bladder.  The  ureteroscope was then re-placed and a larger stone is identified stuck to the wall on the medial side in the mid ureter, and this was again lazed with low power in order to fragment the stone.  These fragments were also basket extracted.  No further stones are identified in the ureter, and retrograde pyelogram looked normal.  However, with a large amount of stone burden and laser necessary, I felt it important to leave a double-J stent.  Therefore, a 6-French x 24 double- J stent is passed into the renal pelvis and into the bladder.  It is placed under fluoroscopic control.  The patient tolerated the procedure well.  She was given IV Toradol prior to awakening.  She was awakened and taken to recovery room in good condition.     Seddrick Flax I. Patsi Sears, M.D.     SIT/MEDQ  D:  01/28/2011  T:  01/28/2011  Job:  [id number]  cc:   Marne A. Tower, MD [address] [place], [place] [address]  Electronically Signed by Jethro Bolus M.D. on 02/04/2011 09:32:37 AM

## 2011-02-15 ENCOUNTER — Encounter: Payer: Self-pay | Admitting: Family Medicine

## 2011-02-18 ENCOUNTER — Telehealth: Payer: Self-pay | Admitting: Family Medicine

## 2011-02-26 NOTE — Progress Notes (Signed)
Summary: refill request for imitrex  Phone Note Refill Request Message from:  Fax from Pharmacy  Refills Requested: Medication #1:  IMITREX 100 MG  TABS 1 by mouth once daily as needed migraine as directed Faxed request from [facility], fax is on your shelf.  Initial call taken by: Lowella Petties CMA, AAMA,  February 18, 2011 4:23 PM  Follow-up for Phone Call        form done and in nurse in box   Follow-up by: Judith Part MD,  February 18, 2011 5:22 PM  Additional Follow-up for Phone Call Additional follow up Details #1::        Completed form faxed to [phone] as instructed.Lewanda Rife LPN  February 18, 2011 5:34 PM     Prescriptions: IMITREX 100 MG  TABS (SUMATRIPTAN SUCCINATE) 1 by mouth once daily as needed migraine as directed  #27 x 3   Entered and Authorized by:   Judith Part MD   Signed by:   Lewanda Rife LPN on 86/57/8469   Method used:   Historical   RxID:   [id number]

## 2011-02-26 NOTE — Letter (Signed)
Summary: [facility]  [facility]   Imported By: Kassie Mends 02/20/2011 09:33:24  _____________________________________________________________________  External Attachment:    Type:   Image     Comment:   External Document

## 2011-03-13 ENCOUNTER — Other Ambulatory Visit: Payer: Self-pay

## 2011-04-23 NOTE — Discharge Summary (Signed)
[name], FACER NO.:  [id number]   MEDICAL RECORD NO.:  [id number]          PATIENT TYPE:  IPS   LOCATION:  0301                          FACILITY:  [facility]   PHYSICIAN:  Anselm Jungling, MD  DATE OF BIRTH:  [birth year]   DATE OF ADMISSION:  03/22/2007  DATE OF DISCHARGE:  03/25/2007                               DISCHARGE SUMMARY   IDENTIFYING DATA AND REASON FOR ADMISSION:  The patient is a 60 year old  married female admitted after an episode of attempted suicide via  asphyxiation which was accompanied by psychotic symptoms.  The patient  reported that she had a history of Graves disease and was subject to  thyroid storms.  She stated that she was prone to this more due to  having recently had pneumonia.  The patient was seen and medically  cleared at an emergency department prior to transfer to our inpatient  psychiatric service.  Her TSH level had been measured as normal.  The  patient had not been on any psychotropic medication.  The patient had  also been dehydrated and hypokalemic during her episode of mental status  changes.  The patient stated that she had had episodic, bizarre mental  changes during hyperthyroid episodes in the past.  She had been seeing  an endocrinologist.  She denied any recollection of the above-mentioned  suicide attempts and attributed it to being in a delirious and confused  state due to thyroid problems.  Upon admission, she denied suicidal  ideation.  Please refer to the admission note for further details  pertaining to the symptoms, circumstances and history that led to her  hospitalization.  She was given initial Axis I diagnosis of status post  delirium secondary to medical conditions.   MEDICAL AND LABORATORY:  As above.  The patient was medically and  physically assessed by the psychiatric nurse practitioner.  There were  no significant laboratory studies that supported the patient's  contention that she might have  been experiencing a thyroid storm.  It  was presented to the patient that what she had been experiencing was  more akin to anxiety-related symptoms, which the patient ultimately  agreed with.  She presented as a well-nourished, well-developed female  who initially reported that she felt that she was having medical  problems instead of psychiatric problems.  She was alert, fully  oriented, well organized, well groomed, articulate, and a forthcoming in  detailed historian.  There were no signs or symptoms of psychosis,  thought disorder, or delirium.  Her mood and affect were appropriate to  the situation.  During the initial interview, she requested discharge.   We attempted to contact her usual medical providers.   In keeping with the patient's agreement that she probably had an  underlying mood disorder, a trial of Lexapro was begun and was well  tolerated.   On the third hospital day, there was a family session involving the  patient's husband and son.  In that meeting, she stated she was not  having any suicidal ideation.  The patient and her family discussed that  the  patient had good supports available to her and that she worried too  much about being a burden to her family.  The family encouraged the  patient to allow them to help her more and to communicate with them  about her needs and feelings.  The patient stated that she would  continue to take her medications as prescribed, go to individual  counseling, and medication management.  They were given information on  suicide prevention and the crisis hotline.   The following day, the patient was discharged in much better spirits.  She agreed to the following aftercare plan.   AFTERCARE:  The patient was to follow up with Dr. Evelene Croon, with an  appointment on March 27, 2007 and with Delanna Ahmadi, therapist in [facility].  The patient was also instructed to follow up with Dr. Ferdinand Lango on  April 17th for medical issues.    DISCHARGE MEDICATIONS:  Lexapro 10 mg daily.   DISCHARGE DIAGNOSES:  AXIS I: Major depressive disorder, not otherwise  specified.  AXIS II:  __________  AXIS III: History of hyperthyroidism, Graves disease.  AXIS IV: Stressors severe.  AXIS V: Global Assessment of Functioning (GAF) on discharge 65.      Anselm Jungling, MD  Electronically Signed     SPB/MEDQ  D:  04/06/2007  T:  04/06/2007  Job:  [id number]

## 2011-04-26 NOTE — Discharge Summary (Signed)
[name], [name] NO.:  [id number]   MEDICAL RECORD NO.:  [id number]          PATIENT TYPE:  INP   LOCATION:  2038                         FACILITY:  [facility]   PHYSICIAN:  Willow Ora, MD           DATE OF BIRTH:  09/02/1951   DATE OF ADMISSION:  03/19/2007  DATE OF DISCHARGE:                               DISCHARGE SUMMARY   PRIMARY CARE PHYSICIAN:  Marne A. Tower, MD at [facility].   BRIEF HISTORY AND PHYSICAL:  [name] is a  60 year old female who  presented to the ER complaining of being dizzy, weak and clammy.  Lisa  was recently treated as an outpatient for pneumonia with a 5 day course  of Zithromax.  Lisa finished her antibiotics 1 day prior to admission.  Overall Lisa thought that Lisa was getting better but in the last 24 hours  Lisa felt worse again.  Lisa felt dizzy, weak, clammy.  Lisa had nausea,  vomiting without any diarrhea.   PHYSICAL EXAMINATION:  GENERAL APPEARANCE:  On physical exam, the  patient was pale, slightly diaphoretic, but in no overt distress.  VITAL SIGNS:  Blood pressure 149/88, heart rate upon arrival to the  emergency room was 144, respiratory rate 16, temperature 100.  LUNGS:  Lungs were clear to auscultation bilaterally without any  wheezing.  CARDIOVASCULAR:  Regular rate and rhythm.  ABDOMEN:  Abdomen was soft, nondistended.  EXTREMITIES:  No edema.   CLINICAL DATA:  Laboratory and x-rays showed initial white count was  15.4 with a hemoglobin of 12.6, platelets of 626,000.  Repeated white  count was 12.5.  Initial creatinine was 1.5 and at the time of discharge  it was 0.7.  The potassium was initially low at 2.8 and at the time of  discharge was 3.9.  Cardiac enzymes were negative.  Calcium was 8.4.  Influenza nasal swab was negative.  Urine culture was obtained and at  the time of the discharge was negative.  TSH was 0.9.  Chest x-ray upon  admission showed mild chronic bronchitis without any other   abnormalities.  Urinalysis showed few bacteria and three to six white  blood cells.   HOSPITAL COURSE:  The patient was admitted to the hospital and started  on IV fluids and potassium supplements.  Lisa was also started  empirically on Avelox.  Her HCTZ was held but  we continued with the  irbesartan 150 mg one p.o. daily.  During the hospital stay Lisa  gradually improved and at the time of discharge Lisa was feeling much  better, able to tolerate her food.  Lisa denied any nausea, vomiting or  abdominal pain.  Lisa was still coughing some.  The night prior to  discharge Lisa did spike a temperature of 100.2; despite that Lisa was  feeling very well and Lisa felt ready to go home.  At this point I think  Lisa has gotten maximal hospital benefit and Lisa will be discharged home  today.   DISCHARGE INSTRUCTIONS:  The instructions are as follows:  1. Avelox 400  mg one p.o. daily for 7 days.  2. Mucinex DM for cough.  3. Zofran as needed for nausea.  4. Continue all the home medications that include the acyclovir,      Avalide, Premarin, Detrol LA, Cytomel, Synthroid, Singulair and      Imdur.  5. Drink plenty of fluids.  6. Go back to the emergency room if the symptoms resurface.  7. Work excuse for the next 3 or 4 days.  8. See Dr. Milinda Antis next week.   ADMISSION DIAGNOSIS:  Suspected acute illness.   DISCHARGE DIAGNOSES:  1. Fever, possibly from viral illness versus an atypical bronchitis.      Lisa will be discharged on empirical Avelox for 7 days.  2. Hypokalemia.  We suspect that this was due to nausea and vomiting.      Her last potassium is normal and Lisa will be discharged with her      regular Avalide.  I suspect that Lisa will not need any potassium      supplements.  This needs to be followed up next week by her primary      care doctor.  3. Hyperthyroidism.  4. Hypertension.  5. Asthma.      Willow Ora, MD  Electronically Signed     JP/MEDQ  D:  03/21/2007  T:  03/21/2007   Job:  [id number]   cc:   Marne A. Milinda Antis, MD

## 2011-04-26 NOTE — Op Note (Signed)
NAME:  [name], [name]                         ACCOUNT NO.:  [id number]   MEDICAL RECORD NO.:  [id number]                   PATIENT TYPE:  OIB   LOCATION:  3007                                 FACILITY:  [facility]   PHYSICIAN:  Stefani Dama, M.D.               DATE OF BIRTH:  06/15/1951   DATE OF PROCEDURE:  05/15/2004  DATE OF DISCHARGE:  05/16/2004                                 OPERATIVE REPORT   PREOPERATIVE DIAGNOSIS:  Cervical spondylosis with radiculopathy, C6, C7.   POSTOPERATIVE DIAGNOSIS:  Cervical spondylosis with radiculopathy, C6, C7.   PROCEDURE:  Anterior cervical decompression and arthrodesis, C6, C7,  structural allograft, Synthes fixation, removal of Synthes plate C4 to C6.   SURGEON:  Stefani Dama, M.D.   FIRST ASSISTANT:  Hilda Lias, M.D.   ANESTHESIA:  General endotracheal.   INDICATIONS:  The patient is a 60 year old individual who has had  significant left shoulder and left arm pain.  She has a foraminal disk  protrusion in addition to severe spondylitic disease at C6-7.  She has been  advised regarding anterior cervical decompression and arthrodesis.   PROCEDURE:  The patient was brought to the operating room and placed on the  table in supine position.  After the smooth induction of general  endotracheal anesthesia, she was placed in five pounds of Holter traction.  The neck was shaved, prepped with Duraprep, and draped in a sterile fashion.  An elliptical incision was created around her previously-made incision, and  this scar was excised.  The dissection was carried down through the platysma  and then the plane between the sternocleidomastoid and the strap muscles was  dissected.  Scar tissue was evident in this area from previous anterior  dissection.  The inferior margin of the plate was identified and then by  tracing along the fascial plane of the plate, the dissection was carried  cephalad to expose the entire plate and the six screws.   The scar tissue  overlying the screw heads was removed and then using the available  screwdrivers, the plate was removed.  This was noted to be a small-stature  plate between C4 and C6.  Once this area was inspected, the arthrodeses were  noted to be solid, bony, and intact.  Attention was turned to C6-7, where  the prevertebral tissues were dissected bluntly and the disk space was  identified.  The disk space was then opened using a combination of curettes  and rongeurs and a #15 blade to open the ventral aspect of the disk space  itself.  A small quantity of severely degenerated disk material was removed  from within the disk space.  A self-retaining spreader could then be worked  into the disk space, and gradually this space could be opened and enlarged  to allow the passage of a high-speed drill with a 2.3 mm dissecting tool to  remove the inferior margin  of a bony osteophyte from the vertebral body of  C6.  This was then drilled out laterally and the left-sided C6 nerve root  was decompressed by removing some thickened ligament and degenerated disk  that was in the foramen on that left side.  The C7 nerve root was then  cleared out to the lateral gutter.  Hemostasis in the epidural space was  then obtained using some small pledgets of Gelfoam soaked in thrombin, which  were later irrigated away.  Dissection on the right side was carried out in  a similar fashion and again significant osteophytic spur from the inferior  margin of the body of C6 was removed, and this was dissected out.  The  foramen was then cleared.  Once this was performed and the dissection was  completed, hemostasis was achieved in the lateral recesses and then the  space was sized for an appropriate-size graft, which was noted to be a 6 mm  lordotic graft.  This was filled with some DBX bone matrix and then inserted  into the space and counter sunk approximately a millimeter.  An 18 mm small-  stature Synthes plate  was the placed into the interspace in a reversed  position with the screw holes that were in C6 being used on the inferior-  more margin of the plate, and the superior margin of the plate was placed in  C7.  These were locked into position.  The final localizing radiograph  identified good position of the bone graft and good position of the plate  itself.  Hemostasis in the soft tissues was then meticulously obtained and  he cervical dorsal fascia was closed with the platysma being closed with 3-0  Vicryl, and 3-0 Vicryl was used in the subcuticular tissues.  The patient  tolerated the procedure well and was returned to the recovery room in stable  condition.                                               Stefani Dama, M.D.    Merla Riches  D:  05/15/2004  T:  05/16/2004  Job:  [id number]

## 2011-04-26 NOTE — Assessment & Plan Note (Signed)
[facility]                                 ON-CALL NOTE   [name], [name]                      MRN:          [id number]  DATE:03/14/2007                            DOB:          1951/03/17    PRIMARY:  Marne A. Tower, MD   Phone number is [phone].   SUBJECTIVE:  Patient is calling to cancel 10:30 a.m. appointment for the  Saturday clinic because of feeling better.     Kerby Nora, MD  Electronically Signed    AB/MedQ  DD: 03/14/2007  DT: 03/15/2007  Job #: [id number]

## 2011-04-26 NOTE — Discharge Summary (Signed)
NAMEPIETRA, ZULUAGA NO.:  [id number]   MEDICAL RECORD NO.:  [id number]          PATIENT TYPE:  INP   LOCATION:  3729                         FACILITY:  [facility]   PHYSICIAN:  Rene Paci, M.D. LHCDATE OF BIRTH:  04-04-1951   DATE OF ADMISSION:  12/21/2004  DATE OF DISCHARGE:                                 DISCHARGE SUMMARY   DISCHARGE DIAGNOSES:  1.  Hyperthyroidism, question thyrotoxic storm versus underlying Graves'      disease or transient viral illness.  2.  Altered mental status  secondary to above plus viral upper respiratory      infection.  3.  Sinus tachycardia secondary to #1.  4.  History of hypertension.  5.  History of asthma.  6.  Mild hypokalemia, unclear etiology.   DISCHARGE MEDICATIONS:  1.  PTU 100 mg p.o. t.i.d. or as directed.  2.  Inderal 40 mg p.o. t.i.d. or as directed.  3.  Avelox 400 mg p.o. daily x5 additional days to complete empiric      treatment of upper respiratory infection.  4.  Ativan 0.5 mg 1-2 p.o. q.6-8h. p.r.n. anxiety, agitation or insomnia.   The patient is instructed to discontinue her verapamil for the time being.  The patient may also continue her Accolade and __________  as prior to  admission.   PRIMARY CARE PHYSICIAN FOLLOWUP:  Marne A. Tower, M.D., Thursday, December 27, 2004 at 11 a.m. to follow up on symptom control and consider evaluation  at endocrinology, depending on patient's course and response to treatment.  Also consider outpatient radioactive iodine uptake, again depending on  clinical course.   HOSPITAL COURSE:  Problem 1. Hyperthyroidism.  The patient is a 60 year old  woman with 1 week of viral flu-like symptoms who came to the emergency room  due to confusion and agitation.  This had been noted by her family,  especially worse at night and perhaps attributed to Robitussin DM.  The  patient was found to be in sinus tachycardia and she was admitted for  further evaluation of this.  On  clinical exam, the patient was tremulous and  not known to be an anxious person, concerning for thyroid storm; in fact,  her TSH was 0.24 and T3 and T4 were elevated up to __________ and 13.5,  respectively.  Upon review of hyperthyroid symptoms, the patient says she  has been suffering with these for many weeks and was anxious to begin  treatment which was started on the day prior to discharge with PTU and  Inderal.  Her blood pressure as well as heart rate have been controlled in  the 70s and systolic in the 140s-150s.  Outpatient followup and further  testing, depending on clinical course and will be deferred to primary care  physician.  Of note, cardiac enzymes were negative, CT of chest was negative  for PE and 2-D echo was negative, ruling out other causes for tachycardia or  confusion.   Problem 2. Viral upper respiratory infection.  The patient has resumed  course of antibiotics for empiric treatment.  [place]ale   VL/MEDQ  D:  12/24/2004  T:  12/24/2004  Job:  [id number]

## 2011-09-06 ENCOUNTER — Encounter: Payer: Self-pay | Admitting: Family Medicine

## 2011-09-09 ENCOUNTER — Ambulatory Visit (INDEPENDENT_AMBULATORY_CARE_PROVIDER_SITE_OTHER): Payer: 59 | Admitting: Family Medicine

## 2011-09-09 ENCOUNTER — Encounter: Payer: Self-pay | Admitting: Family Medicine

## 2011-09-09 VITALS — BP 122/82 | HR 72 | Temp 98.0°F | Ht 67.0 in | Wt 199.2 lb

## 2011-09-09 DIAGNOSIS — I10 Essential (primary) hypertension: Secondary | ICD-10-CM

## 2011-09-09 DIAGNOSIS — Z01818 Encounter for other preprocedural examination: Secondary | ICD-10-CM | POA: Insufficient documentation

## 2011-09-09 DIAGNOSIS — E039 Hypothyroidism, unspecified: Secondary | ICD-10-CM

## 2011-09-09 LAB — CBC WITH DIFFERENTIAL/PLATELET
Basophils Absolute: 0.1 10*3/uL (ref 0.0–0.1)
Eosinophils Relative: 3.3 % (ref 0.0–5.0)
HCT: 38.5 % (ref 36.0–46.0)
Hemoglobin: 12.9 g/dL (ref 12.0–15.0)
Lymphocytes Relative: 30.5 % (ref 12.0–46.0)
Lymphs Abs: 2.5 10*3/uL (ref 0.7–4.0)
Monocytes Relative: 5.5 % (ref 3.0–12.0)
Neutro Abs: 4.9 10*3/uL (ref 1.4–7.7)
Platelets: 413 10*3/uL — ABNORMAL HIGH (ref 150.0–400.0)
RDW: 12.8 % (ref 11.5–14.6)
WBC: 8.2 10*3/uL (ref 4.5–10.5)

## 2011-09-09 LAB — BASIC METABOLIC PANEL
CO2: 26 mEq/L (ref 19–32)
Chloride: 102 mEq/L (ref 96–112)
Glucose, Bld: 101 mg/dL — ABNORMAL HIGH (ref 70–99)
Sodium: 138 mEq/L (ref 135–145)

## 2011-09-09 NOTE — Assessment & Plan Note (Signed)
Stable on current meds.  Continue. 

## 2011-09-09 NOTE — Assessment & Plan Note (Addendum)
No major or other clinical predictors of increased perioperative CV risk.  Low risk procedure.  Per urology note, h/o afib but verified with patient - no h/o afib or h/o irregular heart beat in past.  No need for EKG today according to ACC/AHA 2007 guidelines. Cleared for surgery - checked CBC, BMP, and TSH today given h/o hypothyroid.  H/o asthma but seems well controlled, no recent flares.  Nonsmoker.

## 2011-09-09 NOTE — Patient Instructions (Signed)
Blood work today to check on kidneys, thyroid and blood count. We still have not received form for clearance, but when received I will fill it out and fax it over to [facility].

## 2011-09-09 NOTE — Progress Notes (Signed)
Subjective:    Patient ID: [name], female    DOB: October 17, 1951, 60 y.o.   MRN: [id number]  HPI CC: surgical clearance  60 yo new to me with h/o hypothyroidism, hypokalemia, HTN, HLD, asthma, h/o multiple B ureteral calculi with previous hydronephrosis and hydroureter s/p R removal 01/2011 presents today for surgical clearance for upcoming ureterocystoscopy, RPG and ureteral stone extraction and possible stent placement on right.  Last seen here 12/2010 for CPE by PCP.  Sent to urologist - found to have UTI, kidney stones.  Did have cystoscopy/ureteroscopy with laser and R stent placement/stone extraction 01/2011, tolerated well.  Did have stent placed at that time, in place for a few weeks and then removed.  To have GETA again, no problems last time.  Has had hysterectomy, 2 back surgeries and tonsillectomy in past.  Urologist - Dr. Patsi Sears.  Reviewed note from [facility] 08/30/2011.  CT scan with bilateral stones, right nonobstructing stone.  H/o hydronephrosis in past.  Denies h/o afib (although this was in urology records).  Sees endo q6 mo for thyroid check.  Coming up for recheck next month.  Does walk daily, 2 mi 3x/wk, has treadmill.  No chest pain or SOB with this.  Only shortness of breath when asthma acting up, none recently.  Medications and allergies reviewed and updated in chart.  Past histories reviewed and updated if relevant as below. Patient Active Problem List  Diagnoses  . FEVER BLISTER  . HYPERTHYROIDISM  . HYPOTHYROIDISM  . HYPERLIPIDEMIA  . HYPOKALEMIA  . ANEMIA NOS  . LEUKOCYTOSIS NOS  . THROMBOCYTOSIS  . HYPERTENSION  . ALLERGIC RHINITIS  . ASTHMA  . MIXED INCONTINENCE URGE AND STRESS   Past Medical History  Diagnosis Date  . Allergic rhinitis   . Asthma   . HLD (hyperlipidemia)   . HTN (hypertension)   . Hyperthyroidism   . Mixed incontinence   . History of kidney stones   . Anemia, unspecified   . Herpes simplex without mention of  complication   . Hypopotassemia   . Unspecified hypothyroidism   . Leukocytosis, unspecified   . Unspecified diseases of blood and blood-forming organs    Past Surgical History  Procedure Date  . Cervical spine surgery 7/00    HNP x 2; fusion plates  . Abdominal hysterectomy 1987    fivroids; bleeding; anemia  . Tonsillectomy   . Breast reduction surgery   . C-s disk surgery 5/05  . Colonoscopy 2/08    Ext. hemorrhoids  . Kidney stone surgery 2/12   History  Substance Use Topics  . Smoking status: Never Smoker   . Smokeless tobacco: Not on file  . Alcohol Use: No   Family History  Problem Relation Age of Onset  . Heart failure Father   . Osteoporosis Mother   . Diabetes Mother   . Diabetes      Grandfather  . Coronary artery disease      Grandmother   Allergies  Allergen Reactions  . Shellfish-Derived Products Anaphylaxis   Current Outpatient Prescriptions on File Prior to Visit  Medication Sig Dispense Refill  . acyclovir (ZOVIRAX) 400 MG tablet Take 400 mg by mouth daily.        . calcium-vitamin D (OSCAL WITH D) 500-200 MG-UNIT per tablet Take 1 tablet by mouth 3 (three) times daily.        Marland Kitchen estrogens, conjugated, (PREMARIN) 0.3 MG tablet Take 0.3 mg by mouth daily. Take daily for 21 days then do  not take for 7 days.       Marland Kitchen levothyroxine (SYNTHROID, LEVOTHROID) 125 MCG tablet Take 125 mcg by mouth daily.        Marland Kitchen liothyronine (CYTOMEL) 25 MCG tablet Take 25 mcg by mouth daily.        Marland Kitchen losartan-hydrochlorothiazide (HYZAAR) 50-12.5 MG per tablet Take 1 tablet by mouth daily.        . montelukast (SINGULAIR) 10 MG tablet Take 10 mg by mouth at bedtime.        . Multiple Vitamin (MULTIVITAMIN) tablet Take 1 tablet by mouth daily.        . propranolol (INNOPRAN XL) 120 MG 24 hr capsule Take 120 mg by mouth at bedtime.        . SUMAtriptan (IMITREX) 100 MG tablet Take 100 mg by mouth every 2 (two) hours as needed.        . tolterodine (DETROL LA) 4 MG 24 hr capsule  Take 4 mg by mouth daily.         Review of Systems No flank pain, urinary changes (dysuria, urgency, hematuria, frequency), no abd pain, fevers/chills, shortness of breath, chest pain or tightness.      Objective:   Physical Exam  Nursing note and vitals reviewed. Constitutional: She appears well-developed and well-nourished. No distress.  HENT:  Head: Normocephalic and atraumatic.  Mouth/Throat: Oropharynx is clear and moist. No oropharyngeal exudate.  Eyes: Conjunctivae and EOM are normal. Pupils are equal, round, and reactive to light.  Neck: Normal range of motion. Neck supple. Carotid bruit is not present. No thyromegaly present.  Cardiovascular: Normal rate, regular rhythm, normal heart sounds and intact distal pulses.   No murmur heard. Pulmonary/Chest: Effort normal and breath sounds normal. No respiratory distress. She has no wheezes. She has no rales.  Musculoskeletal: She exhibits no edema.  Lymphadenopathy:    She has no cervical adenopathy.  Skin: Skin is warm and dry. No rash noted.  Psychiatric: She has a normal mood and affect.          Assessment & Plan:

## 2011-09-09 NOTE — Assessment & Plan Note (Signed)
Check TSH today - will fax results to Dr. Patrecia Pace Endo.

## 2011-09-10 ENCOUNTER — Telehealth: Payer: Self-pay | Admitting: Family Medicine

## 2011-09-10 NOTE — Telephone Encounter (Signed)
Please fax back OV, form from [facility], and blood work to [facility]. Please fax blood work results to Dr. Steffanie Rainwater Endo. Please notify pt that blood count, sugar normal.  Kidneys stable.  Thyroid returned a bit low, rec call endo to help titrate thyroid meds.

## 2011-09-10 NOTE — Telephone Encounter (Signed)
Patient notified. Notes and labs faxed as requested to [facility] and to Dr. Patrecia Pace.

## 2011-10-14 ENCOUNTER — Other Ambulatory Visit: Payer: Self-pay | Admitting: Family Medicine

## 2011-10-14 NOTE — Telephone Encounter (Signed)
[facility]  Sent refill request for Hyzaar 50-12.5 mg # 90 x 1. Pt has CPX appt [facility] scheduled 12/30/10. Dr Milinda Antis please advise about Zovirax refill request. Last filled 10/12/11.

## 2011-10-14 NOTE — Telephone Encounter (Signed)
Please ask her which of these she needs before her PE in jan -thanks, I am confused  I think she takes the zovirax for cold sores -thanks

## 2011-10-15 NOTE — Telephone Encounter (Signed)
Patient notified as instructed by telephone. Pt said she needs the Zovirax and Hyzaar to be  Filled before her CPX.

## 2011-10-15 NOTE — Telephone Encounter (Signed)
Looks like hyzaar was done I sent zovirax electronically Thanks

## 2011-10-15 NOTE — Telephone Encounter (Signed)
Left v/m at pt's work and home #.

## 2011-10-15 NOTE — Telephone Encounter (Signed)
Left message on pt's work # to call back. Pts husband has cell phone and he is in [place].(don't call cell).

## 2011-10-18 ENCOUNTER — Encounter (HOSPITAL_BASED_OUTPATIENT_CLINIC_OR_DEPARTMENT_OTHER): Payer: Self-pay | Admitting: *Deleted

## 2011-10-18 NOTE — Progress Notes (Signed)
NPO after MN. Pt to arrive at 0715. Needs istat and EKG w/ chart. Will take synthroid, premarin, propranololn, and if needed vicodin w/ sips of water am of surg.

## 2011-10-21 ENCOUNTER — Encounter (HOSPITAL_BASED_OUTPATIENT_CLINIC_OR_DEPARTMENT_OTHER): Payer: Self-pay | Admitting: *Deleted

## 2011-10-21 ENCOUNTER — Encounter (HOSPITAL_BASED_OUTPATIENT_CLINIC_OR_DEPARTMENT_OTHER): Payer: Self-pay

## 2011-10-21 ENCOUNTER — Ambulatory Visit (HOSPITAL_BASED_OUTPATIENT_CLINIC_OR_DEPARTMENT_OTHER)
Admission: RE | Admit: 2011-10-21 | Discharge: 2011-10-21 | Disposition: A | Discharge status: Home / Self Care | Payer: 59 | Source: Ambulatory Visit | Attending: Urology | Admitting: Urology

## 2011-10-21 ENCOUNTER — Encounter (HOSPITAL_BASED_OUTPATIENT_CLINIC_OR_DEPARTMENT_OTHER): Payer: Self-pay | Admitting: Certified Registered"

## 2011-10-21 ENCOUNTER — Ambulatory Visit (HOSPITAL_BASED_OUTPATIENT_CLINIC_OR_DEPARTMENT_OTHER): Payer: 59

## 2011-10-21 ENCOUNTER — Encounter (HOSPITAL_BASED_OUTPATIENT_CLINIC_OR_DEPARTMENT_OTHER)
Admission: RE | Disposition: A | Discharge status: Home / Self Care | Payer: Self-pay | Source: Ambulatory Visit | Attending: Urology

## 2011-10-21 DIAGNOSIS — N816 Rectocele: Secondary | ICD-10-CM | POA: Insufficient documentation

## 2011-10-21 DIAGNOSIS — E785 Hyperlipidemia, unspecified: Secondary | ICD-10-CM | POA: Insufficient documentation

## 2011-10-21 DIAGNOSIS — R3129 Other microscopic hematuria: Secondary | ICD-10-CM | POA: Insufficient documentation

## 2011-10-21 DIAGNOSIS — N201 Calculus of ureter: Secondary | ICD-10-CM | POA: Insufficient documentation

## 2011-10-21 DIAGNOSIS — I10 Essential (primary) hypertension: Secondary | ICD-10-CM | POA: Insufficient documentation

## 2011-10-21 DIAGNOSIS — Z79899 Other long term (current) drug therapy: Secondary | ICD-10-CM | POA: Insufficient documentation

## 2011-10-21 DIAGNOSIS — E0590 Thyrotoxicosis, unspecified without thyrotoxic crisis or storm: Secondary | ICD-10-CM | POA: Insufficient documentation

## 2011-10-21 DIAGNOSIS — J45909 Unspecified asthma, uncomplicated: Secondary | ICD-10-CM | POA: Insufficient documentation

## 2011-10-21 HISTORY — DX: Cyst of kidney, acquired: N28.1

## 2011-10-21 HISTORY — DX: Calculus of ureter: N20.1

## 2011-10-21 HISTORY — PX: CYSTOSCOPY W/ RETROGRADES: SHX1426

## 2011-10-21 LAB — POCT I-STAT 4, (NA,K, GLUC, HGB,HCT): Hemoglobin: 13.3 g/dL (ref 12.0–15.0)

## 2011-10-21 SURGERY — CYSTOSCOPY, WITH RETROGRADE PYELOGRAM
Anesthesia: General | Laterality: Right | Wound class: Clean Contaminated

## 2011-10-21 MED ORDER — CEFAZOLIN SODIUM-DEXTROSE 2-3 GM-% IV SOLR
2.0000 g | INTRAVENOUS | Status: AC
  Administered 2011-10-21: 2 g via INTRAVENOUS

## 2011-10-21 MED ORDER — URELLE 81 MG PO TABS: 1.0000 | ORAL_TABLET | Freq: Three times a day (TID) | ORAL | Status: DC

## 2011-10-21 MED ORDER — HYDROMORPHONE HCL PF 1 MG/ML IJ SOLN: 0.2500 mg | INTRAMUSCULAR | Status: DC | PRN

## 2011-10-21 MED ORDER — POTASSIUM CITRATE ER 15 MEQ (1620 MG) PO TBCR: 1.0000 | EXTENDED_RELEASE_TABLET | Freq: Three times a day (TID) | ORAL | Status: DC

## 2011-10-21 MED ORDER — ONDANSETRON HCL 4 MG/2ML IJ SOLN
INTRAMUSCULAR | Status: DC | PRN
  Administered 2011-10-21: 4 mg via INTRAVENOUS

## 2011-10-21 MED ORDER — ACETAMINOPHEN 10 MG/ML IV SOLN
INTRAVENOUS | Status: DC | PRN
  Administered 2011-10-21: 1000 mg via INTRAVENOUS

## 2011-10-21 MED ORDER — MIDAZOLAM HCL 5 MG/5ML IJ SOLN
INTRAMUSCULAR | Status: DC | PRN
  Administered 2011-10-21: 2 mg via INTRAVENOUS

## 2011-10-21 MED ORDER — PROMETHAZINE HCL 25 MG/ML IJ SOLN
6.2500 mg | INTRAMUSCULAR | Status: DC | PRN
  Administered 2011-10-21: 6.25 mg via INTRAVENOUS

## 2011-10-21 MED ORDER — TRIMETHOPRIM 100 MG PO TABS: 100.0000 mg | ORAL_TABLET | ORAL | Status: AC

## 2011-10-21 MED ORDER — SODIUM CHLORIDE 0.9 % IR SOLN
Status: DC | PRN
  Administered 2011-10-21: 3000 mL

## 2011-10-21 MED ORDER — CEFAZOLIN SODIUM 1-5 GM-% IV SOLN
1.0000 g | INTRAVENOUS | Status: DC
Start: 2011-10-21 — End: 2011-10-21

## 2011-10-21 MED ORDER — IOHEXOL 350 MG/ML SOLN
INTRAVENOUS | Status: DC | PRN
  Administered 2011-10-21: 10 mL

## 2011-10-21 MED ORDER — KETOROLAC TROMETHAMINE 30 MG/ML IJ SOLN
INTRAMUSCULAR | Status: DC | PRN
  Administered 2011-10-21: 30 mg via INTRAVENOUS

## 2011-10-21 MED ORDER — LACTATED RINGERS IV SOLN
INTRAVENOUS | Status: DC
  Administered 2011-10-21: 125 mL/h via INTRAVENOUS
  Administered 2011-10-21: 10:00:00 via INTRAVENOUS

## 2011-10-21 MED ORDER — PROPOFOL 10 MG/ML IV EMUL
INTRAVENOUS | Status: DC | PRN
  Administered 2011-10-21: 200 mg via INTRAVENOUS

## 2011-10-21 MED ORDER — FENTANYL CITRATE 0.05 MG/ML IJ SOLN
INTRAMUSCULAR | Status: DC | PRN
  Administered 2011-10-21: 50 ug via INTRAVENOUS
  Administered 2011-10-21 (×4): 25 ug via INTRAVENOUS

## 2011-10-21 MED ORDER — LIDOCAINE HCL (CARDIAC) 20 MG/ML IV SOLN
INTRAVENOUS | Status: DC | PRN
  Administered 2011-10-21: 80 mg via INTRAVENOUS

## 2011-10-21 MED ORDER — HYDROCODONE-ACETAMINOPHEN 7.5-650 MG PO TABS: 1.0000 | ORAL_TABLET | Freq: Four times a day (QID) | ORAL | Status: AC | PRN

## 2011-10-21 MED ORDER — CEFAZOLIN SODIUM 1-5 GM-% IV SOLN: 1.0000 g | INTRAVENOUS | Status: DC

## 2011-10-21 MED ORDER — BELLADONNA ALKALOIDS-OPIUM 16.2-60 MG RE SUPP
RECTAL | Status: DC | PRN
  Administered 2011-10-21: 1 via RECTAL

## 2011-10-21 SURGICAL SUPPLY — 25 items
ADAPTER CATH URET PLST 4-6FR (CATHETERS) ×1 IMPLANT
ADPR CATH URET STRL DISP 4-6FR (CATHETERS) ×1
BAG DRAIN URO-CYSTO SKYTR STRL (DRAIN) ×2 IMPLANT
BAG DRN UROCATH (DRAIN) ×1
BASKET ZERO TIP NITINOL 2.4FR (BASKET) ×2 IMPLANT
BOOTIES KNEE HIGH SLOAN (MISCELLANEOUS) ×2 IMPLANT
BSKT STON RTRVL ZERO TP 2.4FR (BASKET) ×2
CANISTER SUCT LVC 12 LTR MEDI- (MISCELLANEOUS) ×1 IMPLANT
CATH CLEAR GEL 3F BACKSTOP (CATHETERS) ×1 IMPLANT
CATH INTERMIT  6FR 70CM (CATHETERS) ×1 IMPLANT
CLOTH BEACON ORANGE TIMEOUT ST (SAFETY) ×2 IMPLANT
DRAPE CAMERA CLOSED 9X96 (DRAPES) ×2 IMPLANT
GLOVE BIO SURGEON STRL SZ7.5 (GLOVE) ×2 IMPLANT
GLOVE INDICATOR 6.5 STRL GRN (GLOVE) ×2 IMPLANT
GOWN STRL NON-REIN LRG LVL3 (GOWN DISPOSABLE) ×1 IMPLANT
GOWN STRL REIN XL XLG (GOWN DISPOSABLE) ×2 IMPLANT
GOWN XL W/COTTON TOWEL STD (GOWNS) ×1 IMPLANT
GUIDEWIRE 0.038 PTFE COATED (WIRE) IMPLANT
GUIDEWIRE ANG ZIPWIRE 038X150 (WIRE) IMPLANT
GUIDEWIRE STR DUAL SENSOR (WIRE) ×1 IMPLANT
LASER FIBER DISP (UROLOGICAL SUPPLIES) ×1 IMPLANT
NS IRRIG 500ML POUR BTL (IV SOLUTION) IMPLANT
NSNARE STENT RETRIEVER ×1 IMPLANT
PACK CYSTOSCOPY (CUSTOM PROCEDURE TRAY) ×2 IMPLANT
STENT URET 6FRX24 CONTOUR (STENTS) ×1 IMPLANT

## 2011-10-21 NOTE — Anesthesia Postprocedure Evaluation (Signed)
  Anesthesia Post-op Note  Patient: [name]  Procedure(s) Performed:  CYSTOSCOPY WITH RETROGRADE PYELOGRAM - CYSTOSCOPY RIGHT RETROGRADE, PYELOGRAM  URETEROSCOPY WITH HOLMIUM LASER AND STONE EXTRACTION  Patient Location: PACU  Anesthesia Type: General  Level of Consciousness: awake and oriented  Airway and Oxygen Therapy: Patient Spontanous Breathing  Post-op Pain: mild  Post-op Assessment: Post-op Vital signs reviewed, Patient's Cardiovascular Status Stable, Respiratory Function Stable and Patent Airway  Post-op Vital Signs: stable  Complications: No apparent anesthesia complications

## 2011-10-21 NOTE — Anesthesia Preprocedure Evaluation (Signed)
Anesthesia Evaluation  Patient identified by MRN, date of birth, ID band Patient awake    Reviewed: Allergy & Precautions, H&P , NPO status , Patient's Chart, lab work & pertinent test results, reviewed documented beta blocker date and time   Airway   Neck ROM: Limited   Comment: S/p C-fusion, reduced lat rotation Dental No notable dental hx.    Pulmonary neg pulmonary ROS, asthma ,  Mild Asthma, inhalers rarely. clear to auscultation        Cardiovascular hypertension, Pt. on medications Regular Normal Denies cardiac symptoms   Neuro/Psych Negative Neurological ROS  Negative Psych ROS   GI/Hepatic negative GI ROS, Neg liver ROS,   Endo/Other  Hyperthyroidism Hx Hyperthyroid/Graves Dz. Radioactive Iodine. Now on replacement  Renal/GU kiney stone   Genitourinary negative   Musculoskeletal negative musculoskeletal ROS (+)   Abdominal   Peds negative pediatric ROS (+)  Hematology negative hematology ROS (+)   Anesthesia Other Findings   Reproductive/Obstetrics negative OB ROS                           Anesthesia Physical Anesthesia Plan  ASA: II  Anesthesia Plan: General   Post-op Pain Management:    Induction: Intravenous  Airway Management Planned: LMA  Additional Equipment:   Intra-op Plan:   Post-operative Plan: Extubation in OR  Informed Consent:   Plan Discussed with: CRNA and Surgeon  Anesthesia Plan Comments:         Anesthesia Quick Evaluation

## 2011-10-21 NOTE — Op Note (Signed)
The patient is a 60 year old female, status post cystoscopy, laser fractionation of right ureteral stones in February, 2012, with double-J removal in March of 2012. Six-month followup exam showed microhematuria, and CT scan shows 8 mm distal left ureteral stone and a 9 mm right proximal ureteral stone. She is off her potassium citrate.  Cystourethroscopy was accomplished, shows normal urethra. Vaginal examination shows grade 3 rectocele. There is no cystocele, enterocele, or urethral hypermobility. Right retrograde Cyd Silence shows abnormality in the lower ureter, and midureter, consistent with stones. KUB does not show stones, even on double magnification. The ureteroscope was placed in the ureter, and the right lower ureteral calculus is identified. Laser fragmentation of stone was accomplished, and basket extraction of fragments was accomplished. Ureteroscopic examination of the mid ureter shows a fragment of the lower ureteral cuff was, as well as a 9 mm right midureteral calculus. This is photo documented. The 5 French catheter was then placed, and [employer] was placed above the midureteral calculus. Following this, laser fragmentation of the midureteral This was accomplished, and basket extraction of fragments was then accomplished. Following this, right double-J stent was placed over the guidewire in standard fashion, 6 [place] by 26 cm catheter. Bladder was drained of fluid, the patient was given IV Toradol, as well as IV, with the beginning of the procedure, and the Endo suppository at the beginning of the procedure. She tolerated the procedure well, was awakened, taken recovery room in good condition.

## 2011-10-21 NOTE — Anesthesia Procedure Notes (Addendum)
Procedure Name: LMA Insertion Performed by: Renella Cunas D Pre-anesthesia Checklist: Patient identified, Emergency Drugs available, Suction available and Patient being monitored Patient Re-evaluated:Patient Re-evaluated prior to inductionOxygen Delivery Method: Circle System Utilized Preoxygenation: Pre-oxygenation with 100% oxygen Intubation Type: IV induction Ventilation: Mask ventilation without difficulty LMA: LMA inserted LMA Size: 4.0 Number of attempts: 1 Placement Confirmation: positive ETCO2 Dental Injury: Teeth and Oropharynx as per pre-operative assessment

## 2011-10-21 NOTE — OR Nursing (Signed)
Stone obtained and taken by Careers adviser

## 2011-10-21 NOTE — Transfer of Care (Signed)
Immediate Anesthesia Transfer of Care Note  Patient: [name]  Procedure(s) Performed:  CYSTOSCOPY WITH RETROGRADE PYELOGRAM - CYSTOSCOPY RIGHT RETROGRADE, PYELOGRAM  URETEROSCOPY WITH HOLMIUM LASER AND STONE EXTRACTION  Patient Location: PACU  Anesthesia Type: General  Level of Consciousness: awake, oriented, sedated and patient cooperative  Airway & Oxygen Therapy: Patient Spontanous Breathing and Patient connected to face mask oxygen  Post-op Assessment: Report given to PACU RN and Post -op Vital signs reviewed and stable  Post vital signs: Reviewed and stable  Complications: No apparent anesthesia complications

## 2011-10-21 NOTE — H&P (Signed)
Urology Admission H&P  Chief Complaint: Right ureteral sones  History of Present Illness:  60 yo female post cysto, R retrograde pyelogram and laser of ureteral stones in Febuary, 2012, now found to have recurrent R ureteral stones, with CT 08/26/11 ( compared with 01/25/11) showing 9mm R mid-ureteral stone and 8mm R u-v junction stone.  She deniesw fever, chills, flank pain or gross hematuria, but was found to have microhematuria on u/a in the office.   Past Medical History  Diagnosis Date  . Allergic rhinitis   . HLD (hyperlipidemia)     borderline-  no meds  . Mixed incontinence     urge and stress incontinence  . History of kidney stones   . Herpes simplex without mention of complication     controlled w/ meds (no current fever blisters)  . Leukocytosis, unspecified hx of 10 yrs ago    no problem since  . Anemia, unspecified     b12 def.  . Asthma     controlled w/ singulair  . Hyperthyroidism hx ptu 2006    takes synthroid  . HTN (hypertension)     echo- 2006  . Migraine   . Ureteral calculi right     s/p laser  litho w/ stone extraction 01-28-11  . Renal cyst right    Past Surgical History  Procedure Date  . Breast reduction surgery 1994  . Tonsillectomy 1960  . Vaginal hysterectomy 1987    fibroids/anemia  . Cervical fusion 2000    fusion c4-6  . Cervical fusion 2005    fusion c6-7 and plate removal Z6-1  . Cystoscopy/retrograde/ureteroscopy/stone extraction with basket 01-28-11    right     Home Medications:  Prescriptions prior to admission  Medication Sig Dispense Refill  . acyclovir (ZOVIRAX) 200 MG capsule TAKE 2 CAPSULES DAILY  180 capsule  3  . calcium-vitamin D (OSCAL WITH D) 500-200 MG-UNIT per tablet Take 2 tablets by mouth daily.       Marland Kitchen estrogens, conjugated, (PREMARIN) 0.3 MG tablet Take 0.3 mg by mouth every morning. Take daily for 21 days then do not take for 7 days.      . irbesartan-hydrochlorothiazide (AVALIDE) 150-12.5 MG per tablet Take 1  tablet by mouth every morning.        Marland Kitchen levothyroxine (SYNTHROID, LEVOTHROID) 125 MCG tablet Take 125 mcg by mouth every morning.       Marland Kitchen liothyronine (CYTOMEL) 25 MCG tablet Take 25 mcg by mouth every evening.       . montelukast (SINGULAIR) 10 MG tablet Take 10 mg by mouth at bedtime.       . Multiple Vitamin (MULTIVITAMIN) tablet Take 1 tablet by mouth daily.       . propranolol (INNOPRAN XL) 120 MG 24 hr capsule Take 120 mg by mouth every morning.       . SUMAtriptan (IMITREX) 100 MG tablet Take 100 mg by mouth every 2 (two) hours as needed.       . tolterodine (DETROL LA) 4 MG 24 hr capsule Take 4 mg by mouth every morning.       . vitamin B-12 (CYANOCOBALAMIN) 500 MCG tablet Take 500 mcg by mouth daily.        Marland Kitchen HYDROcodone-acetaminophen (LORTAB) 7.5-500 MG per tablet Take 1 tablet by mouth every 6 (six) hours as needed.         Allergies:  Allergies  Allergen Reactions  . Contrast Media (Iodinated Diagnostic Agents) Anaphylaxis  . Shellfish-Derived Products  Anaphylaxis    Family History  Problem Relation Age of Onset  . Heart failure Father   . Osteoporosis Mother   . Diabetes Mother   . Diabetes      Grandfather  . Coronary artery disease      Grandmother   Social History:  reports that she has never smoked. She has never used smokeless tobacco. She reports that she does not drink alcohol or use illicit drugs.  Review of Systems  Constitutional: Negative for fever, chills and malaise/fatigue.  HENT: Negative.   Eyes: Negative.   Respiratory: Negative.   Cardiovascular: Negative.   Gastrointestinal: Negative.  Negative for nausea, vomiting and abdominal pain.  Genitourinary: Negative for dysuria, urgency, frequency, hematuria and flank pain.  Musculoskeletal: Negative.   Neurological: Negative.  Negative for weakness.  Endo/Heme/Allergies: Negative.   Psychiatric/Behavioral: Negative.     Physical Exam:  Vital signs in last 24 hours: Temp:  [97.4 F (36.3 C)]  97.4 F (36.3 C) (11/12 0722) Pulse Rate:  [68] 68  (11/12 0722) Resp:  [16] 16  (11/12 0722) BP: (142)/(93) 142/93 mmHg (11/12 0722) SpO2:  [95 %] 95 % (11/12 0722) Physical Exam  Vitals reviewed. Constitutional: She is oriented to person, place, and time. She appears well-developed and well-nourished.  Eyes: Pupils are equal, round, and reactive to light.  Respiratory: No respiratory distress. She has no wheezes.  GI: Soft. She exhibits no distension and no mass. There is no tenderness. There is no rebound and no guarding.       No flank pain  Musculoskeletal: Normal range of motion.  Neurological: She is alert and oriented to person, place, and time.  Skin: Skin is warm. No rash noted. No erythema. No pallor.  Psychiatric: She has a normal mood and affect.    Laboratory Data:  Results for orders placed during the hospital encounter of 10/21/11 (from the past 24 hour(s))  POCT I-STAT 4, (NA,K, GLUC, HGB,HCT)     Status: Abnormal   Collection Time   10/21/11  7:49 AM      Component Value Range   Sodium 138  135 - 145 (mEq/L)   Potassium 4.3  3.5 - 5.1 (mEq/L)   Glucose, Bld 100 (*) 70 - 99 (mg/dL)   HCT 16.1  09.6 - 04.5 (%)   Hemoglobin 13.3  12.0 - 15.0 (g/dL)   No results found for this or any previous visit (from the past 240 hour(s)). Creatinine: No results found for this basename: CREATININE:7 in the last 168 hours Baseline Creatinine:   Impression/Assessment:  Multiple R ureteral stones. Pt needs ureteroscopy an d multiple R ureteral stones.   Plan:  For OR today.   Ailen Strauch I 10/21/2011, 8:16 AM

## 2011-10-24 ENCOUNTER — Encounter (HOSPITAL_BASED_OUTPATIENT_CLINIC_OR_DEPARTMENT_OTHER): Payer: Self-pay | Admitting: Urology

## 2011-10-25 ENCOUNTER — Encounter (HOSPITAL_BASED_OUTPATIENT_CLINIC_OR_DEPARTMENT_OTHER): Payer: Self-pay

## 2011-12-05 ENCOUNTER — Other Ambulatory Visit: Payer: Self-pay | Admitting: Family Medicine

## 2011-12-05 NOTE — Telephone Encounter (Signed)
[facility] request refill premarin 0.3 mg #90 x 3,Detrol La 4 mg # 90 x 3,Innopran XL 120 mg #90 x3 and Singulair 10 mg #90 x 3. Pt seen 09/09/11 for surgical clearance and pt has CPX scheduled 12/31/11.

## 2011-12-23 ENCOUNTER — Telehealth: Payer: Self-pay | Admitting: Family Medicine

## 2011-12-23 DIAGNOSIS — I10 Essential (primary) hypertension: Secondary | ICD-10-CM

## 2011-12-23 DIAGNOSIS — E0590 Thyrotoxicosis, unspecified without thyrotoxic crisis or storm: Secondary | ICD-10-CM

## 2011-12-23 DIAGNOSIS — Z0000 Encounter for general adult medical examination without abnormal findings: Secondary | ICD-10-CM | POA: Insufficient documentation

## 2011-12-23 DIAGNOSIS — E039 Hypothyroidism, unspecified: Secondary | ICD-10-CM

## 2011-12-23 DIAGNOSIS — E785 Hyperlipidemia, unspecified: Secondary | ICD-10-CM

## 2011-12-23 NOTE — Telephone Encounter (Signed)
Message copied by Judy Pimple on Mon Dec 23, 2011  8:55 PM ------      Message from: Alvina Chou      Created: Tue Dec 17, 2011  3:58 PM      Regarding: lab orders for 12-24-11       Patient is scheduled for CPX labs, please order future labs, Thanks , Camelia Eng

## 2011-12-23 NOTE — Telephone Encounter (Signed)
Message copied by Judy Pimple on Mon Dec 23, 2011  8:50 PM ------      Message from: Alvina Chou      Created: Mon Dec 23, 2011  8:28 AM      Regarding: labs for 12-24-11       Patient is scheduled for CPX labs, please order future labs, Thanks , Camelia Eng

## 2011-12-24 ENCOUNTER — Other Ambulatory Visit (INDEPENDENT_AMBULATORY_CARE_PROVIDER_SITE_OTHER): Payer: 59

## 2011-12-24 DIAGNOSIS — E785 Hyperlipidemia, unspecified: Secondary | ICD-10-CM

## 2011-12-24 DIAGNOSIS — Z0000 Encounter for general adult medical examination without abnormal findings: Secondary | ICD-10-CM

## 2011-12-24 DIAGNOSIS — E0590 Thyrotoxicosis, unspecified without thyrotoxic crisis or storm: Secondary | ICD-10-CM

## 2011-12-24 LAB — LIPID PANEL
Cholesterol: 228 mg/dL — ABNORMAL HIGH (ref 0–200)
HDL: 61 mg/dL (ref >39.00)
Total CHOL/HDL Ratio: 4
Triglycerides: 318 mg/dL — ABNORMAL HIGH (ref 0.0–149.0)
VLDL: 63.6 mg/dL — ABNORMAL HIGH (ref 0.0–40.0)

## 2011-12-24 LAB — CBC WITH DIFFERENTIAL/PLATELET
Basophils Absolute: 0 10*3/uL (ref 0.0–0.1)
Basophils Relative: 0.3 % (ref 0.0–3.0)
Eosinophils Absolute: 0.4 10*3/uL (ref 0.0–0.7)
Hemoglobin: 12.9 g/dL (ref 12.0–15.0)
Lymphocytes Relative: 24.7 % (ref 12.0–46.0)
MCHC: 33.7 g/dL (ref 30.0–36.0)
MCV: 87.4 fl (ref 78.0–100.0)
Monocytes Absolute: 0.5 10*3/uL (ref 0.1–1.0)
Neutro Abs: 6.8 10*3/uL (ref 1.4–7.7)
Neutrophils Relative %: 65.9 % (ref 43.0–77.0)
RBC: 4.37 Mil/uL (ref 3.87–5.11)
RDW: 13.2 % (ref 11.5–14.6)

## 2011-12-24 LAB — LDL CHOLESTEROL, DIRECT: Direct LDL: 111.1 mg/dL

## 2011-12-24 LAB — COMPREHENSIVE METABOLIC PANEL
ALT: 19 U/L (ref 0–35)
AST: 20 U/L (ref 0–37)
Alkaline Phosphatase: 88 U/L (ref 39–117)
BUN: 18 mg/dL (ref 6–23)
Chloride: 107 mEq/L (ref 96–112)
Creatinine, Ser: 0.9 mg/dL (ref 0.4–1.2)
Total Bilirubin: 0.6 mg/dL (ref 0.3–1.2)

## 2011-12-31 ENCOUNTER — Ambulatory Visit (INDEPENDENT_AMBULATORY_CARE_PROVIDER_SITE_OTHER): Payer: 59 | Admitting: Family Medicine

## 2011-12-31 ENCOUNTER — Encounter: Payer: Self-pay | Admitting: Family Medicine

## 2011-12-31 VITALS — BP 126/84 | HR 80 | Temp 98.3°F | Ht 66.0 in | Wt 195.2 lb

## 2011-12-31 DIAGNOSIS — E039 Hypothyroidism, unspecified: Secondary | ICD-10-CM

## 2011-12-31 DIAGNOSIS — E876 Hypokalemia: Secondary | ICD-10-CM

## 2011-12-31 DIAGNOSIS — I10 Essential (primary) hypertension: Secondary | ICD-10-CM

## 2011-12-31 DIAGNOSIS — Z0000 Encounter for general adult medical examination without abnormal findings: Secondary | ICD-10-CM

## 2011-12-31 DIAGNOSIS — E0590 Thyrotoxicosis, unspecified without thyrotoxic crisis or storm: Secondary | ICD-10-CM

## 2011-12-31 DIAGNOSIS — E785 Hyperlipidemia, unspecified: Secondary | ICD-10-CM

## 2011-12-31 DIAGNOSIS — Z1231 Encounter for screening mammogram for malignant neoplasm of breast: Secondary | ICD-10-CM

## 2011-12-31 DIAGNOSIS — E669 Obesity, unspecified: Secondary | ICD-10-CM

## 2011-12-31 DIAGNOSIS — Z780 Asymptomatic menopausal state: Secondary | ICD-10-CM

## 2011-12-31 MED ORDER — ZOSTER VACCINE LIVE 19400 UNT/0.65ML ~~LOC~~ SOLR: 0.6500 mL | Freq: Once | SUBCUTANEOUS | Status: AC

## 2011-12-31 MED ORDER — MONTELUKAST SODIUM 10 MG PO TABS: 10.0000 mg | ORAL_TABLET | Freq: Every day | ORAL | Status: DC

## 2011-12-31 MED ORDER — IRBESARTAN-HYDROCHLOROTHIAZIDE 150-12.5 MG PO TABS: 1.0000 | ORAL_TABLET | ORAL | Status: DC

## 2011-12-31 MED ORDER — SUMATRIPTAN SUCCINATE 100 MG PO TABS: 100.0000 mg | ORAL_TABLET | ORAL | Status: DC | PRN

## 2011-12-31 MED ORDER — PROPRANOLOL HCL ER BEADS 120 MG PO CP24: 120.0000 mg | ORAL_CAPSULE | Freq: Every day | ORAL | Status: DC

## 2011-12-31 MED ORDER — ESTROGENS CONJUGATED 0.3 MG PO TABS: 0.3000 mg | ORAL_TABLET | Freq: Every day | ORAL | Status: DC

## 2011-12-31 MED ORDER — POTASSIUM CITRATE ER 15 MEQ (1620 MG) PO TBCR: 1.0000 | EXTENDED_RELEASE_TABLET | Freq: Three times a day (TID) | ORAL | Status: DC

## 2011-12-31 MED ORDER — OXYBUTYNIN CHLORIDE ER 10 MG PO TB24: 10.0000 mg | ORAL_TABLET | Freq: Every day | ORAL | Status: DC

## 2011-12-31 NOTE — Assessment & Plan Note (Signed)
Euthyroid and clinically stable Will see Dr Judie Petit for one more year

## 2011-12-31 NOTE — Assessment & Plan Note (Signed)
Disc tx with premarin and risks of blood clot and MI/ CVA and breast cancer Her symptoms of hot flashes are so severe - she chooses to continue until retirement  Also sched dexa

## 2011-12-31 NOTE — Assessment & Plan Note (Signed)
Level is ok on current dose  No changes refil done

## 2011-12-31 NOTE — Assessment & Plan Note (Signed)
LDL down but trig still high Rev low fat diet-and getting started back with fitness and wt loss  Disc goals for lipids and reasons to control them Rev labs with pt Rev low sat fat diet in detail

## 2011-12-31 NOTE — Assessment & Plan Note (Signed)
bp in fair control at this time  No changes needed  Disc lifstyle change with low sodium diet and exercise   

## 2011-12-31 NOTE — Assessment & Plan Note (Signed)
Now hypothyroid after tx  Will continue to see endo for 1 more year and then i will monitor dexa scheduled

## 2011-12-31 NOTE — Assessment & Plan Note (Signed)
Reviewed health habits including diet and exercise and skin cancer prevention Also reviewed health mt list, fam hx and immunizations  emph imp of wt loss Rev wellness labs Px for zostavax given

## 2011-12-31 NOTE — Assessment & Plan Note (Signed)
Rev plan in detail to cut calories and exercise 5 d per week for wt loss Disc need for better health and bmi goal

## 2011-12-31 NOTE — Progress Notes (Signed)
Subjective:    Patient ID: [name], female    DOB: 28-Sep-1951, 61 y.o.   MRN: [id number]  HPI Here for health maintenance exam and to review chronic medical problems   Just had uri that is going around -- with cold symtoms  Is still tired    Wt is up 15 lb since last wt here with bmi of 31 Diet not that great -knows she has gained weight - needs to be accountable and will start process of loss  Was yo yo dieter in the past  Exercise-- is starting back with walking   bp is   126/84  Today--good control  No cp or palpitations or headaches or edema  No side effects to medicines    Thyroid status Lab Results  Component Value Date   TSH 0.80 12/24/2011  will be leaving Dr Patrecia Pace soon  Now just hypothyroid    K-good on current supplement Lab Results  Component Value Date   K 4.2 12/24/2011    Lipids- diet controlled Lab Results  Component Value Date   CHOL 228* 12/24/2011   CHOL 271* 11/12/2010   CHOL 202* 11/09/2009   Lab Results  Component Value Date   HDL 61.00 12/24/2011   HDL 40.10 11/12/2010   HDL 27.25 11/09/2009   No results found for this basename: LDLCALC   Lab Results  Component Value Date   TRIG 318.0* 12/24/2011   TRIG 319.0* 11/12/2010   TRIG 247.0* 11/09/2009   Lab Results  Component Value Date   CHOLHDL 4 12/24/2011   CHOLHDL 4 11/12/2010   CHOLHDL 4 11/09/2009   Lab Results  Component Value Date   LDLDIRECT 111.1 12/24/2011   LDLDIRECT 143.1 11/12/2010   LDLDIRECT 109.9 11/09/2009   overall imp LDL - inc trig Is trying to eat healthy - and exercise  Sugar is ok   Had partial hyst for fibroids No gyn problems  No new partners    Zoster status-ins does not pay-- will get [facility]  Flu shot- got it in sept at pharmacy   mammo 1/12- is due now -- [facility]  Self exam nl   colonosc 2/08- 10 year follow up   Last dexa - was normal - not recent  Thinks 08   Patient Active Problem List  Diagnoses  . FEVER BLISTER  .  HYPERTHYROIDISM  . HYPOTHYROIDISM  . HYPERLIPIDEMIA  . HYPOKALEMIA  . ANEMIA NOS  . LEUKOCYTOSIS NOS  . THROMBOCYTOSIS  . HYPERTENSION  . ALLERGIC RHINITIS  . ASTHMA  . MIXED INCONTINENCE URGE AND STRESS  . Preoperative clearance  . Routine general medical examination at a health care facility  . Obesity  . Other screening mammogram  . Post-menopausal   Past Medical History  Diagnosis Date  . Allergic rhinitis   . HLD (hyperlipidemia)     borderline-  no meds  . Mixed incontinence     urge and stress incontinence  . History of kidney stones   . Herpes simplex without mention of complication     controlled w/ meds (no current fever blisters)  . Leukocytosis, unspecified hx of 10 yrs ago    no problem since  . Anemia, unspecified     b12 def.  . Asthma     controlled w/ singulair  . Hyperthyroidism hx ptu 2006    takes synthroid  . HTN (hypertension)     echo- 2006  . Migraine   . Ureteral calculi right  s/p laser  litho w/ stone extraction 01-28-11  . Renal cyst right    Past Surgical History  Procedure Date  . Breast reduction surgery 1994  . Tonsillectomy 1960  . Vaginal hysterectomy 1987    fibroids/anemia  . Cervical fusion 2000    fusion c4-6  . Cervical fusion 2005    fusion c6-7 and plate removal Z6-1  . Cystoscopy/retrograde/ureteroscopy/stone extraction with basket 01-28-11    right   . Cystoscopy w/ retrogrades 10/21/2011    Procedure: CYSTOSCOPY WITH RETROGRADE PYELOGRAM;  Surgeon: Kathi Ludwig, MD;  [facility];  Service: Urology;  Laterality: Right;  CYSTOSCOPY RIGHT RETROGRADE, PYELOGRAM  URETEROSCOPY WITH HOLMIUM LASER AND STONE EXTRACTION   History  Substance Use Topics  . Smoking status: Never Smoker   . Smokeless tobacco: Never Used  . Alcohol Use: No   Family History  Problem Relation Age of Onset  . Heart failure Father   . Osteoporosis Mother   . Diabetes Mother   . Diabetes      Grandfather    . Coronary artery disease      Grandmother   Allergies  Allergen Reactions  . Contrast Media (Iodinated Diagnostic Agents) Anaphylaxis  . Shellfish-Derived Products Anaphylaxis   Current Outpatient Prescriptions on File Prior to Visit  Medication Sig Dispense Refill  . acyclovir (ZOVIRAX) 200 MG capsule TAKE 2 CAPSULES DAILY  180 capsule  3  . calcium-vitamin D (OSCAL WITH D) 500-200 MG-UNIT per tablet Take 2 tablets by mouth daily.       Marland Kitchen levothyroxine (SYNTHROID, LEVOTHROID) 125 MCG tablet Take 125 mcg by mouth every morning.       Marland Kitchen liothyronine (CYTOMEL) 25 MCG tablet Take 25 mcg by mouth every evening.       . Multiple Vitamin (MULTIVITAMIN) tablet Take 1 tablet by mouth daily.       . Potassium Citrate 15 MEQ (1620 MG) TBCR Take 1 tablet by mouth 3 (three) times daily.  270 tablet  3  . URELLE (URELLE/URISED) 81 MG TABS Take 1 tablet (81 mg total) by mouth 3 (three) times daily.  30 each  2  . vitamin B-12 (CYANOCOBALAMIN) 500 MCG tablet Take 500 mcg by mouth daily.             Review of Systems Review of Systems  Constitutional: Negative for fever, appetite change, fatigue and unexpected weight change.  Eyes: Negative for pain and visual disturbance.  ENT pos for runny  / stuffy nose  Respiratory: Negative for cough and shortness of breath.   Cardiovascular: Negative for cp or palpitations    Gastrointestinal: Negative for nausea, diarrhea and constipation.  Genitourinary: Negative for urgency and frequency.  Skin: Negative for pallor or rash   Neurological: Negative for weakness, light-headedness, numbness and headaches.  Hematological: Negative for adenopathy. Does not bruise/bleed easily.  Psychiatric/Behavioral: Negative for dysphoric mood. The patient is not nervous/anxious.          Objective:   Physical Exam  Constitutional: She appears well-developed and well-nourished. No distress.       overwt and well appearing   HENT:  Head: Normocephalic and  atraumatic.  Right Ear: External ear normal.  Left Ear: External ear normal.  Mouth/Throat: Oropharynx is clear and moist.       Nares are boggy and congested   Eyes: Conjunctivae and EOM are normal. Pupils are equal, round, and reactive to light. No scleral icterus.  Neck: Normal range of motion. Neck  supple. No JVD present. Carotid bruit is not present. Erythema present. No thyromegaly present.  Cardiovascular: Normal rate, regular rhythm, normal heart sounds and intact distal pulses.  Exam reveals no gallop.   Pulmonary/Chest: Effort normal and breath sounds normal. No respiratory distress. She has no wheezes. She has no rales.  Abdominal: Soft. Bowel sounds are normal. She exhibits no distension, no abdominal bruit and no mass. There is no tenderness.  Genitourinary: No breast swelling, tenderness, discharge or bleeding.       Breast exam: No mass, nodules, thickening, tenderness, bulging, retraction, inflamation, nipple discharge or skin changes noted.  No axillary or clavicular LA.  Chaperoned exam.    Musculoskeletal: Normal range of motion. She exhibits no edema and no tenderness.  Lymphadenopathy:    She has no cervical adenopathy.  Neurological: She is alert. She has normal reflexes. She displays no tremor. No cranial nerve deficit. She exhibits normal muscle tone. Coordination normal.  Skin: Skin is warm and dry. No rash noted. No erythema. No pallor.  Psychiatric: She has a normal mood and affect.          Assessment & Plan:

## 2011-12-31 NOTE — Assessment & Plan Note (Signed)
Scheduled annual screening mammogram Nl breast exam today  Encouraged monthly self exams   

## 2011-12-31 NOTE — Patient Instructions (Addendum)
We wil schedule mammogram and bone density test at check out  Get your shingles vaccine at [facility]  Let us know when you are ready to come off of the hormone replacement I sent px to your mail order pharmacy

## 2012-01-03 ENCOUNTER — Ambulatory Visit (HOSPITAL_COMMUNITY)
Admission: RE | Admit: 2012-01-03 | Discharge: 2012-01-03 | Disposition: A | Discharge status: Home / Self Care | Payer: 59 | Source: Ambulatory Visit | Attending: Family Medicine | Admitting: Family Medicine

## 2012-01-03 DIAGNOSIS — Z1231 Encounter for screening mammogram for malignant neoplasm of breast: Secondary | ICD-10-CM | POA: Insufficient documentation

## 2012-01-06 ENCOUNTER — Ambulatory Visit (HOSPITAL_COMMUNITY)
Admission: RE | Admit: 2012-01-06 | Discharge: 2012-01-06 | Disposition: A | Discharge status: Home / Self Care | Payer: 59 | Source: Ambulatory Visit | Attending: Family Medicine | Admitting: Family Medicine

## 2012-01-06 DIAGNOSIS — Z780 Asymptomatic menopausal state: Secondary | ICD-10-CM | POA: Insufficient documentation

## 2012-01-06 DIAGNOSIS — E0590 Thyrotoxicosis, unspecified without thyrotoxic crisis or storm: Secondary | ICD-10-CM | POA: Insufficient documentation

## 2012-01-06 DIAGNOSIS — E039 Hypothyroidism, unspecified: Secondary | ICD-10-CM

## 2012-01-06 DIAGNOSIS — Z13820 Encounter for screening for osteoporosis: Secondary | ICD-10-CM | POA: Insufficient documentation

## 2012-01-09 ENCOUNTER — Encounter: Payer: Self-pay | Admitting: *Deleted

## 2012-01-21 ENCOUNTER — Encounter: Payer: Self-pay | Admitting: Family Medicine

## 2012-01-24 ENCOUNTER — Other Ambulatory Visit: Payer: Self-pay | Admitting: Family Medicine

## 2012-05-26 ENCOUNTER — Other Ambulatory Visit: Payer: Self-pay

## 2012-05-26 NOTE — Telephone Encounter (Signed)
[name] with [facility]; Detrol on auto refill. Pt taking Oxybutin. Detrol is not on pts med list. Advised to remove Detrol from auto refill.

## 2012-06-16 ENCOUNTER — Other Ambulatory Visit: Payer: Self-pay | Admitting: Family Medicine

## 2012-06-16 NOTE — Telephone Encounter (Signed)
Left message for patient to call to verify medication request.

## 2012-06-16 NOTE — Telephone Encounter (Signed)
Thanks- send this back to me when you know

## 2012-06-16 NOTE — Telephone Encounter (Signed)
Patient request Detrol LA 4mg  24hr capsule #90 3 refills, I didn't see this medication on patient list.

## 2012-06-16 NOTE — Telephone Encounter (Signed)
I have ditropan/ not detrol (same class) on her med list - please call pt and verify with her what she is taking-thanks

## 2012-06-17 NOTE — Telephone Encounter (Signed)
Left message on patient vm to call and verify medication that she needs.

## 2012-06-19 NOTE — Telephone Encounter (Signed)
Talked with patient, she states that she is taking the Detrol LA 4mg . She would like for that to be sent to pharmacy.

## 2012-06-19 NOTE — Telephone Encounter (Signed)
Will refill electronically  

## 2012-06-22 ENCOUNTER — Other Ambulatory Visit: Payer: Self-pay | Admitting: *Deleted

## 2012-10-14 ENCOUNTER — Other Ambulatory Visit: Payer: Self-pay | Admitting: Family Medicine

## 2012-11-20 ENCOUNTER — Other Ambulatory Visit: Payer: Self-pay | Admitting: Family Medicine

## 2012-12-22 ENCOUNTER — Other Ambulatory Visit: Payer: Self-pay | Admitting: Family Medicine

## 2013-02-22 ENCOUNTER — Telehealth: Payer: Self-pay | Admitting: Family Medicine

## 2013-02-22 NOTE — Telephone Encounter (Signed)
Message copied by Judy Pimple on Mon Feb 22, 2013 10:40 PM ------      Message from: Alvina Chou      Created: Fri Feb 19, 2013  8:29 AM      Regarding: Lab orders for Tuesday, 3.18.14       Patient is scheduled for CPX labs, please order future labs, Thanks , [name]       ------

## 2013-02-23 ENCOUNTER — Other Ambulatory Visit (INDEPENDENT_AMBULATORY_CARE_PROVIDER_SITE_OTHER): Payer: 59

## 2013-02-23 LAB — LIPID PANEL
HDL: 50.1 mg/dL (ref >39.00)
Total CHOL/HDL Ratio: 5

## 2013-02-23 LAB — LDL CHOLESTEROL, DIRECT: Direct LDL: 109.4 mg/dL

## 2013-02-23 LAB — COMPREHENSIVE METABOLIC PANEL
ALT: 18 U/L (ref 0–35)
AST: 20 U/L (ref 0–37)
Albumin: 3.6 g/dL (ref 3.5–5.2)
Alkaline Phosphatase: 76 U/L (ref 39–117)
Calcium: 8.8 mg/dL (ref 8.4–10.5)
Chloride: 104 mEq/L (ref 96–112)
Creatinine, Ser: 0.8 mg/dL (ref 0.4–1.2)
Potassium: 4.2 mEq/L (ref 3.5–5.1)

## 2013-02-23 LAB — CBC WITH DIFFERENTIAL/PLATELET
Basophils Absolute: 0.1 10*3/uL (ref 0.0–0.1)
Eosinophils Absolute: 0.3 10*3/uL (ref 0.0–0.7)
Lymphocytes Relative: 39.3 % (ref 12.0–46.0)
MCHC: 33.7 g/dL (ref 30.0–36.0)
Neutrophils Relative %: 50 % (ref 43.0–77.0)
RDW: 12.9 % (ref 11.5–14.6)

## 2013-02-23 LAB — TSH: TSH: 0.69 u[IU]/mL (ref 0.35–5.50)

## 2013-03-02 ENCOUNTER — Encounter: Payer: Self-pay | Admitting: Family Medicine

## 2013-03-02 ENCOUNTER — Ambulatory Visit (INDEPENDENT_AMBULATORY_CARE_PROVIDER_SITE_OTHER): Payer: 59 | Admitting: Family Medicine

## 2013-03-02 VITALS — BP 116/84 | HR 78 | Temp 98.3°F | Ht 66.5 in | Wt 186.8 lb

## 2013-03-02 DIAGNOSIS — Z0000 Encounter for general adult medical examination without abnormal findings: Secondary | ICD-10-CM

## 2013-03-02 DIAGNOSIS — I10 Essential (primary) hypertension: Secondary | ICD-10-CM

## 2013-03-02 DIAGNOSIS — E785 Hyperlipidemia, unspecified: Secondary | ICD-10-CM

## 2013-03-02 DIAGNOSIS — Z780 Asymptomatic menopausal state: Secondary | ICD-10-CM

## 2013-03-02 DIAGNOSIS — E039 Hypothyroidism, unspecified: Secondary | ICD-10-CM

## 2013-03-02 MED ORDER — FENOFIBRATE 48 MG PO TABS: 48.0000 mg | ORAL_TABLET | Freq: Every day | ORAL | Status: DC

## 2013-03-02 MED ORDER — IRBESARTAN-HYDROCHLOROTHIAZIDE 150-12.5 MG PO TABS: 1.0000 | ORAL_TABLET | ORAL | Status: DC

## 2013-03-02 MED ORDER — PROPRANOLOL HCL ER BEADS 120 MG PO CP24: 120.0000 mg | ORAL_CAPSULE | Freq: Every day | ORAL | Status: DC

## 2013-03-02 MED ORDER — SUMATRIPTAN SUCCINATE 100 MG PO TABS: 100.0000 mg | ORAL_TABLET | ORAL | Status: DC | PRN

## 2013-03-02 MED ORDER — ESTROGENS CONJUGATED 0.3 MG PO TABS: 0.3000 mg | ORAL_TABLET | Freq: Every day | ORAL | Status: DC

## 2013-03-02 MED ORDER — TOLTERODINE TARTRATE ER 4 MG PO CP24: 4.0000 mg | ORAL_CAPSULE | Freq: Every day | ORAL | Status: DC

## 2013-03-02 MED ORDER — MONTELUKAST SODIUM 10 MG PO TABS: 10.0000 mg | ORAL_TABLET | Freq: Every day | ORAL | Status: DC

## 2013-03-02 NOTE — Assessment & Plan Note (Signed)
bp in fair control at this time  No changes needed  Disc lifstyle change with low sodium diet and exercise   Rev labs 

## 2013-03-02 NOTE — Assessment & Plan Note (Signed)
Now trig in 400s despite good diet  Will start fenofibrate 48 mg - update if side eff and check lab in 6 weeks Rev low fat diet

## 2013-03-02 NOTE — Progress Notes (Signed)
Subjective:    Patient ID: [name], female    DOB: [birth year], 62 y.o.   MRN: [id number]  HPI Here for health maintenance exam and to review chronic medical problems    Wt is down 9 lb with bmi of 29 - good Is working very hard at this  Is proud of this  Retired this year- and much less stressed and time to take care of herself  Husband is still working  Does a lot with the grandchildren   Has had hysterectomy in past  No gyn problems and no gyn visits   [facility]13[facility] Self exam-no lumps or changes  Flu vaccine - did get vaccine this fall   colonosc 2/08- 10 year recall   On HRT- still on the premarin .3 mg  Thinks the benefits outweigh the risks - for her severe menopausal symptoms   dexa in 2/13 was normal   Mood- has been excellent - esp since she retired  Hyperlipidemia Lab Results  Component Value Date   CHOL 230* 02/23/2013   CHOL 228* 12/24/2011   CHOL 271* 11/12/2010   Lab Results  Component Value Date   HDL 50.10 02/23/2013   HDL 65.78 12/24/2011   HDL 46.96 11/12/2010   No results found for this basename: LDLCALC   Lab Results  Component Value Date   TRIG 432.0 Triglyceride is over 400; calculations on Lipids are invalid.* 02/23/2013   TRIG 318.0* 12/24/2011   TRIG 319.0* 11/12/2010   Lab Results  Component Value Date   CHOLHDL 5 02/23/2013   CHOLHDL 4 12/24/2011   CHOLHDL 4 11/12/2010   Lab Results  Component Value Date   LDLDIRECT 109.4 02/23/2013   LDLDIRECT 111.1 12/24/2011   LDLDIRECT 143.1 11/12/2010    bp is stable today  No cp or palpitations or headaches or edema  No side effects to medicines  BP Readings from Last 3 Encounters:  03/02/13 116/84  12/31/11 126/84  10/21/11 134/86     Patient Active Problem List  Diagnosis  . FEVER BLISTER  . HYPERTHYROIDISM  . HYPOTHYROIDISM  . HYPERLIPIDEMIA  . HYPOKALEMIA  . ANEMIA NOS  . LEUKOCYTOSIS NOS  . THROMBOCYTOSIS  . HYPERTENSION  . ALLERGIC RHINITIS  . ASTHMA   . MIXED INCONTINENCE URGE AND STRESS  . Routine general medical examination at a health care facility  . Obesity  . Other screening mammogram  . Post-menopausal   Past Medical History  Diagnosis Date  . Allergic rhinitis   . HLD (hyperlipidemia)     borderline-  no meds  . Mixed incontinence     urge and stress incontinence  . History of kidney stones   . Herpes simplex without mention of complication     controlled w/ meds (no current fever blisters)  . Leukocytosis, unspecified hx of 10 yrs ago    no problem since  . Anemia, unspecified     b12 def.  . Asthma     controlled w/ singulair  . Hyperthyroidism hx ptu 2006    takes synthroid  . HTN (hypertension)     echo- 2006  . Migraine   . Ureteral calculi right     s/p laser  litho w/ stone extraction 01-28-11  . Renal cyst right    Past Surgical History  Procedure Laterality Date  . Breast reduction surgery  1994  . Tonsillectomy  1960  . Vaginal hysterectomy  1987    fibroids/anemia  . Cervical fusion  2000    fusion c4-6  . Cervical fusion  2005    fusion c6-7 and plate removal W0-9  . Cystoscopy/retrograde/ureteroscopy/stone extraction with basket  01-28-11    right   . Cystoscopy w/ retrogrades  10/21/2011    Procedure: CYSTOSCOPY WITH RETROGRADE PYELOGRAM;  Surgeon: Kathi Ludwig, MD;  Location: [facility];  Service: Urology;  Laterality: Right;  CYSTOSCOPY RIGHT RETROGRADE, PYELOGRAM  URETEROSCOPY WITH HOLMIUM LASER AND STONE EXTRACTION   History  Substance Use Topics  . Smoking status: Never Smoker   . Smokeless tobacco: Never Used  . Alcohol Use: No   Family History  Problem Relation Age of Onset  . Heart failure Father   . Osteoporosis Mother   . Diabetes Mother   . Diabetes      Grandfather  . Coronary artery disease      Grandmother   Allergies  Allergen Reactions  . Contrast Media (Iodinated Diagnostic Agents) Anaphylaxis  . Shellfish-Derived Products Anaphylaxis    Current Outpatient Prescriptions on File Prior to Visit  Medication Sig Dispense Refill  . acyclovir (ZOVIRAX) 200 MG capsule TAKE 2 CAPSULES DAILY  180 capsule  3  . calcium-vitamin D (OSCAL WITH D) 500-200 MG-UNIT per tablet Take 2 tablets by mouth daily.       Marland Kitchen DETROL LA 4 MG 24 hr capsule TAKE 1 CAPSULE DAILY  90 each  0  . INNOPRAN XL 120 MG 24 hr capsule TAKE 1 CAPSULE DAILY  90 each  0  . irbesartan-hydrochlorothiazide (AVALIDE) 150-12.5 MG per tablet Take 1 tablet by mouth every morning.  90 tablet  3  . levothyroxine (SYNTHROID, LEVOTHROID) 125 MCG tablet Take 125 mcg by mouth every morning.       Marland Kitchen liothyronine (CYTOMEL) 25 MCG tablet Take 25 mcg by mouth every evening.       . montelukast (SINGULAIR) 10 MG tablet TAKE 1 TABLET DAILY  90 tablet  3  . Multiple Vitamin (MULTIVITAMIN) tablet Take 1 tablet by mouth daily.       . Potassium Citrate 15 MEQ (1620 MG) TBCR Take 1 tablet by mouth 3 (three) times daily.  270 tablet  3  . PREMARIN 0.3 MG tablet TAKE 1 TABLET DAILY  90 tablet  1  . SUMAtriptan (IMITREX) 100 MG tablet Take 1 tablet (100 mg total) by mouth every 2 (two) hours as needed.  30 tablet  3  . URELLE (URELLE/URISED) 81 MG TABS Take 1 tablet (81 mg total) by mouth 3 (three) times daily.  30 each  2  . vitamin B-12 (CYANOCOBALAMIN) 500 MCG tablet Take 500 mcg by mouth daily.        . [DISCONTINUED] DETROL LA 4 MG 24 hr capsule TAKE 1 CAPSULE DAILY  90 each  3   No current facility-administered medications on file prior to visit.     Review of Systems Review of Systems  Constitutional: Negative for fever, appetite change, fatigue and unexpected weight change.  Eyes: Negative for pain and visual disturbance.  Respiratory: Negative for cough and shortness of breath.   Cardiovascular: Negative for cp or palpitations    Gastrointestinal: Negative for nausea, diarrhea and constipation.  Genitourinary: Negative for urgency and frequency.  Skin: Negative for pallor or  rash   Neurological: Negative for weakness, light-headedness, numbness and headaches.  Hematological: Negative for adenopathy. Does not bruise/bleed easily.  Psychiatric/Behavioral: Negative for dysphoric mood. The patient is not nervous/anxious.  Objective:   Physical Exam  Constitutional: She appears well-developed and well-nourished. No distress.  overwt and well appearing   HENT:  Head: Normocephalic and atraumatic.  Right Ear: External ear normal.  Left Ear: External ear normal.  Nose: Nose normal.  Mouth/Throat: Oropharynx is clear and moist.  Eyes: Conjunctivae and EOM are normal. Pupils are equal, round, and reactive to light. Right eye exhibits no discharge. Left eye exhibits no discharge. No scleral icterus.  Neck: Normal range of motion. Neck supple. No JVD present. Carotid bruit is not present.  Cardiovascular: Normal rate, regular rhythm, normal heart sounds and intact distal pulses.  Exam reveals no gallop.   Pulmonary/Chest: Effort normal and breath sounds normal. No respiratory distress. She has no wheezes.  Abdominal: Soft. Bowel sounds are normal. She exhibits no distension, no abdominal bruit and no mass. There is no tenderness.  Genitourinary: No breast swelling, tenderness, discharge or bleeding.  Breast exam: No mass, nodules, thickening, tenderness, bulging, retraction, inflamation, nipple discharge or skin changes noted.  No axillary or clavicular LA.  Chaperoned exam.    Musculoskeletal: Normal range of motion. She exhibits no edema and no tenderness.  Lymphadenopathy:    She has no cervical adenopathy.  Neurological: She is alert. She has normal reflexes. She displays no tremor. No cranial nerve deficit. She exhibits normal muscle tone. Coordination normal.  Skin: Skin is warm and dry. No rash noted. No erythema. No pallor.  Psychiatric: She has a normal mood and affect.          Assessment & Plan:

## 2013-03-02 NOTE — Assessment & Plan Note (Signed)
Reviewed health habits including diet and exercise and skin cancer prevention Also reviewed health mt list, fam hx and immunizations  Rev wellness labs in detail 

## 2013-03-02 NOTE — Assessment & Plan Note (Signed)
Continues to see endocrinologist until euthyroid and then we will monitor it Lab Results  Component Value Date   TSH 0.69 02/23/2013

## 2013-03-02 NOTE — Assessment & Plan Note (Signed)
On HRT for post menopausal symptoms- she is aware of risks but continues to take premarin .3 Rev risks in detail- for breast ca and vascular/ clot Will continue mammograms

## 2013-03-02 NOTE — Patient Instructions (Addendum)
Don't forget to make your mammogram appt  Eat a low fat diet  Start fenofibrate for triglycerides and update me if any problems or side effects Schedule fasting lab in 6 weeks

## 2013-03-05 ENCOUNTER — Telehealth: Payer: Self-pay

## 2013-03-05 MED ORDER — SUMATRIPTAN SUCCINATE 100 MG PO TABS: ORAL_TABLET | ORAL | Status: DC

## 2013-03-05 NOTE — Telephone Encounter (Signed)
Carney Bern with [facility] request verification of Imitrex instructions sent on 03/02/13. Ref # [id number].Please advise.

## 2013-03-05 NOTE — Telephone Encounter (Signed)
Will re send px to [facility] with clarified inst

## 2013-03-08 ENCOUNTER — Other Ambulatory Visit: Payer: Self-pay | Admitting: Family Medicine

## 2013-03-08 DIAGNOSIS — Z1231 Encounter for screening mammogram for malignant neoplasm of breast: Secondary | ICD-10-CM

## 2013-03-11 ENCOUNTER — Ambulatory Visit (HOSPITAL_COMMUNITY)
Admission: RE | Admit: 2013-03-11 | Discharge: 2013-03-11 | Disposition: A | Discharge status: Home / Self Care | Payer: 59 | Source: Ambulatory Visit | Attending: Family Medicine | Admitting: Family Medicine

## 2013-03-11 DIAGNOSIS — Z1231 Encounter for screening mammogram for malignant neoplasm of breast: Secondary | ICD-10-CM | POA: Insufficient documentation

## 2013-03-15 ENCOUNTER — Encounter: Payer: Self-pay | Admitting: *Deleted

## 2013-03-24 ENCOUNTER — Other Ambulatory Visit: Payer: Self-pay | Admitting: Family Medicine

## 2013-04-13 ENCOUNTER — Telehealth: Payer: Self-pay

## 2013-04-13 ENCOUNTER — Other Ambulatory Visit (INDEPENDENT_AMBULATORY_CARE_PROVIDER_SITE_OTHER): Payer: 59

## 2013-04-13 DIAGNOSIS — E785 Hyperlipidemia, unspecified: Secondary | ICD-10-CM

## 2013-04-13 LAB — ALT: ALT: 18 U/L (ref 0–35)

## 2013-04-13 LAB — LIPID PANEL: HDL: 61.4 mg/dL (ref >39.00)

## 2013-04-13 NOTE — Telephone Encounter (Signed)
Opened in error

## 2013-04-14 LAB — LDL CHOLESTEROL, DIRECT: Direct LDL: 114.2 mg/dL

## 2013-04-18 ENCOUNTER — Other Ambulatory Visit: Payer: Self-pay | Admitting: Family Medicine

## 2013-04-18 MED ORDER — FENOFIBRATE 145 MG PO TABS: 145.0000 mg | ORAL_TABLET | Freq: Every day | ORAL | Status: DC

## 2013-04-18 NOTE — Telephone Encounter (Signed)
Message copied by Judy Pimple on Sun Apr 18, 2013  1:29 PM ------      Message from: Shon Millet      Created: Fri Apr 16, 2013  3:14 PM       Pt notified of lab results and she advise me that she has been taking the fenofibrate and hasn't missed doses ------

## 2013-04-18 NOTE — Telephone Encounter (Signed)
I want to increase her dose Please call it in  Schedule fasting lab in 6 weeks Urge to eat a low fat diet

## 2013-04-19 MED ORDER — FENOFIBRATE 145 MG PO TABS: 145.0000 mg | ORAL_TABLET | Freq: Every day | ORAL | Status: DC

## 2013-04-19 NOTE — Telephone Encounter (Signed)
Sent electronically to [facility]

## 2013-04-19 NOTE — Telephone Encounter (Signed)
Patient notified as instructed by telephone. Follow-up lab appointment scheduled. Patient states that she wants the new script sent to [facility] mail order pharmacy.

## 2013-06-01 ENCOUNTER — Telehealth: Payer: Self-pay | Admitting: *Deleted

## 2013-06-01 NOTE — Telephone Encounter (Signed)
Received fax from pharmacy Patient is filling 2 rx's for premarin 0.3 mg one for 1 qd and the other for 1qd for 21 days then off for 7 days. Please clarify.

## 2013-06-02 NOTE — Telephone Encounter (Signed)
Since she has had a hysterectomy- I prefer the constant dosing

## 2013-06-02 NOTE — Telephone Encounter (Signed)
[facility] notified to have pt taking med every day

## 2013-06-20 ENCOUNTER — Telehealth: Payer: Self-pay | Admitting: Family Medicine

## 2013-06-20 DIAGNOSIS — E781 Pure hyperglyceridemia: Secondary | ICD-10-CM

## 2013-06-20 NOTE — Telephone Encounter (Signed)
Message copied by Judy Pimple on Sun Jun 20, 2013 12:55 PM ------      Message from: Baldomero Lamy      Created: Wed Jun 16, 2013 11:42 AM      Regarding: f/u labs Mon 06/21/13       Pt is scheduled for labs, but she does not have a f/u appt. Please order.            Thanks      Tasha ------

## 2013-06-21 ENCOUNTER — Other Ambulatory Visit (INDEPENDENT_AMBULATORY_CARE_PROVIDER_SITE_OTHER): Payer: 59

## 2013-06-21 DIAGNOSIS — E781 Pure hyperglyceridemia: Secondary | ICD-10-CM

## 2013-06-21 LAB — LIPID PANEL
Cholesterol: 242 mg/dL — ABNORMAL HIGH (ref 0–200)
HDL: 70.5 mg/dL (ref >39.00)
Triglycerides: 373 mg/dL — ABNORMAL HIGH (ref 0.0–149.0)

## 2013-06-21 LAB — AST: AST: 20 U/L (ref 0–37)

## 2013-06-22 ENCOUNTER — Encounter: Payer: Self-pay | Admitting: Family Medicine

## 2013-07-28 ENCOUNTER — Other Ambulatory Visit: Payer: Self-pay | Admitting: Family Medicine

## 2013-08-18 ENCOUNTER — Other Ambulatory Visit: Payer: Self-pay | Admitting: Family Medicine

## 2013-08-25 ENCOUNTER — Other Ambulatory Visit: Payer: Self-pay | Admitting: Family Medicine

## 2013-08-25 NOTE — Telephone Encounter (Signed)
I think she is due for PE after 3/25-please schedule that and refill until then

## 2013-08-25 NOTE — Telephone Encounter (Signed)
Electronic refill request, no recent/future appt., please advise  

## 2013-08-26 NOTE — Telephone Encounter (Signed)
CPE scheduled for 03/04/13 and meds refilled until then

## 2013-11-01 ENCOUNTER — Ambulatory Visit (INDEPENDENT_AMBULATORY_CARE_PROVIDER_SITE_OTHER): Payer: 59 | Admitting: Family Medicine

## 2013-11-01 ENCOUNTER — Encounter: Payer: Self-pay | Admitting: Family Medicine

## 2013-11-01 VITALS — BP 104/80 | HR 95 | Temp 98.2°F | Ht 66.5 in | Wt 196.0 lb

## 2013-11-01 DIAGNOSIS — M7022 Olecranon bursitis, left elbow: Secondary | ICD-10-CM

## 2013-11-01 DIAGNOSIS — M7020 Olecranon bursitis, unspecified elbow: Secondary | ICD-10-CM

## 2013-11-01 MED ORDER — CEPHALEXIN 500 MG PO CAPS: 1000.0000 mg | ORAL_CAPSULE | Freq: Two times a day (BID) | ORAL | Status: DC

## 2013-11-01 NOTE — Patient Instructions (Signed)
Olecranon Bursitis Bursitis is swelling and soreness (inflammation) of a fluid-filled sac (bursa) that covers and protects a joint. Olecranon bursitis occurs over the elbow.  CAUSES Bursitis can be caused by injury, overuse of the joint, arthritis, or infection.  SYMPTOMS   Tenderness, swelling, warmth, or redness over the elbow.  Elbow pain with movement. This is greater with bending the elbow.  Squeaking sound when the bursa is rubbed or moved.  Increasing size of the bursa without pain or discomfort.  Fever with increasing pain and swelling if the bursa becomes infected. HOME CARE INSTRUCTIONS   Put ice on the affected area.  Put ice in a plastic bag.  Place a towel between your skin and the bag.  Leave the ice on for 15-20 minutes each hour while awake. Do this for the first 2 days.  When resting, elevate your elbow above the level of your heart. This helps reduce swelling.  Continue to put the joint through a full range of motion 4 times per day. Rest the injured joint at other times. When the pain lessens, begin normal slow movements and usual activities.  Only take over-the-counter or prescription medicines for pain, discomfort, or fever as directed by your caregiver.  Reduce your intake of milk and related dairy products (cheese, yogurt). They may make your condition worse. SEEK IMMEDIATE MEDICAL CARE IF:   Your pain increases even during treatment.  You have a fever.  You have heat and inflammation over the bursa and elbow.  You have a red line that goes up your arm.  You have pain with movement of your elbow. MAKE SURE YOU:   Understand these instructions.  Will watch your condition.  Will get help right away if you are not doing well or get worse. Document Released: 12/25/2006 Document Revised: 02/17/2012 Document Reviewed: 11/10/2007 [employer] Patient Information 2014 [employer].  

## 2013-11-01 NOTE — Progress Notes (Signed)
Date:  11/01/2013   Name:  [name]   DOB:  21-Oct-1951   MRN:  [id number] Gender: female Age: 62 y.o.  Primary Physician:  Roxy Manns, MD   Chief Complaint: Elbow Pain   Subjective:   History of Present Illness:  [name] is a 62 y.o. very pleasant female patient who presents with the following:  Pleasant patient with a history of left-sided olecranon bursitis previously several years ago who presents now with 2 day history of some left-sided swelling at the olecranon bursa. She reports having had some warmth and tenderness last life, but she does not have any today. No history of trauma or injury. She is not running any sort of systemic fever or other systemic symptoms.  Past Medical History, Surgical History, Social History, Family History, Problem List, Medications, and Allergies have been reviewed and updated if relevant.  Review of Systems:  GEN: No fevers, chills. Nontoxic. Primarily MSK c/o today. MSK: Detailed in the HPI GI: tolerating PO intake without difficulty Neuro: No numbness, parasthesias, or tingling associated. Otherwise the pertinent positives of the ROS are noted above.   Objective:   Physical Examination: BP 104/80  Pulse 95  Temp(Src) 98.2 F (36.8 C) (Oral)  Ht 5' 6.5" (1.689 m)  Wt 196 lb (88.905 kg)  BMI 31.16 kg/m2   GEN: WDWN, NAD, Non-toxic, Alert & Oriented x 3 HEENT: Atraumatic, Normocephalic.  Ears and Nose: No external deformity. EXTR: No clubbing/cyanosis/edema NEURO: Normal gait.  PSYCH: Normally interactive. Conversant. Not depressed or anxious appearing.  Calm demeanor.   L elbow Ecchymosis or edema: neg ROM: full flexion, extension, pronation, supination The olecranon bursa is puffy and full. Nontender. Is not red. Shoulder ROM: Full Flexion: 5/5 Extension: 5/5 Supination: 5/5  Pronation: 5/5 Wrist ext: 5/5 Wrist flexion: 5/5 No gross bony abnormality Varus and Valgus stress: stable ECRB tenderness:  neg Medial epicondyle: NT Lateral epicondyle, resisted wrist extension from wrist full pronation and flexion: NT grip: 5/5  sensation intact Tinel's, Elbow: negative   Radiology: No results found.  Assessment & Plan:    Bursitis of elbow, left - Plan: Body fluid culture, Gram stain  Classic olecranon bursitis. Given that she felt some warmth and pain last night, and place her on some oral Likes. Also going to send her aspirate for Gram stain and culture.  Olecranon Bursa Aspiration, LEFT Verbal consent was obtained. Risks, benefits, and alternatives discussed. Potential complications including loss of pigment and atrophy were discussed and risk of infection. Discussed with the patient that risk of infection is higher with injection of corticosteroid into this superficial bursa. Prepped with Chloraprep and Ethyl Chloride used for anesthesia. Under sterile conditions, the effected olecranon bursa was aspirated with an 18 gauge needle. 7 cc amount of serosanguinous fluid was aspirated. No corticosteroid was injected.   A compression bandage was applied and the patient was instructed to continue for 72 hours.   Patient Instructions  Olecranon Bursitis Bursitis is swelling and soreness (inflammation) of a fluid-filled sac (bursa) that covers and protects a joint. Olecranon bursitis occurs over the elbow.  CAUSES Bursitis can be caused by injury, overuse of the joint, arthritis, or infection.  SYMPTOMS   Tenderness, swelling, warmth, or redness over the elbow.  Elbow pain with movement. This is greater with bending the elbow.  Squeaking sound when the bursa is rubbed or moved.  Increasing size of the bursa without pain or discomfort.  Fever with increasing pain and swelling if the  bursa becomes infected. HOME CARE INSTRUCTIONS   Put ice on the affected area.  Put ice in a plastic bag.  Place a towel between your skin and the bag.  Leave the ice on for 15-20 minutes each hour  while awake. Do this for the first 2 days.  When resting, elevate your elbow above the level of your heart. This helps reduce swelling.  Continue to put the joint through a full range of motion 4 times per day. Rest the injured joint at other times. When the pain lessens, begin normal slow movements and usual activities.  Only take over-the-counter or prescription medicines for pain, discomfort, or fever as directed by your caregiver.  Reduce your intake of milk and related dairy products (cheese, yogurt). They may make your condition worse. SEEK IMMEDIATE MEDICAL CARE IF:   Your pain increases even during treatment.  You have a fever.  You have heat and inflammation over the bursa and elbow.  You have a red line that goes up your arm.  You have pain with movement of your elbow. MAKE SURE YOU:   Understand these instructions.  Will watch your condition.  Will get help right away if you are not doing well or get worse. Document Released: 12/25/2006 Document Revised: 02/17/2012 Document Reviewed: 11/10/2007 [facility] Patient Information 2014 [place], [place].    Orders Today:  Orders Placed This Encounter  Procedures  . Body fluid culture  . Gram stain    New medications, updates to list, dose adjustments: Meds ordered this encounter  Medications  . cephALEXin (KEFLEX) 500 MG capsule    Sig: Take 2 capsules (1,000 mg total) by mouth 2 (two) times daily.    Dispense:  40 capsule    Refill:  0    Signed,  Kaylee Trivett T. Giorgia Wahler, MD, CAQ Sports Medicine  [facility] [address] [place] [address] Phone: [phone] Fax: [phone]  Updated Complete Medication List:   Medication List       This list is accurate as of: 11/01/13  3:21 PM.  Always use your most recent med list.               acyclovir 200 MG capsule  Commonly known as:  ZOVIRAX  TAKE 2 CAPSULES DAILY     calcium-vitamin D 500-200 MG-UNIT per tablet  Commonly  known as:  OSCAL WITH D  Take 2 tablets by mouth daily.     cephALEXin 500 MG capsule  Commonly known as:  KEFLEX  Take 2 capsules (1,000 mg total) by mouth 2 (two) times daily.     fenofibrate 145 MG tablet  Commonly known as:  TRICOR  Take 1 tablet (145 mg total) by mouth daily.     INNOPRAN XL 120 MG 24 hr capsule  Generic drug:  propranolol  TAKE 1 CAPSULE DAILY     irbesartan-hydrochlorothiazide 150-12.5 MG per tablet  Commonly known as:  AVALIDE  Take 1 tablet by mouth every morning.     levothyroxine 125 MCG tablet  Commonly known as:  SYNTHROID, LEVOTHROID  Take 125 mcg by mouth every morning.     liothyronine 25 MCG tablet  Commonly known as:  CYTOMEL  Take 25 mcg by mouth every evening.     montelukast 10 MG tablet  Commonly known as:  SINGULAIR  TAKE 1 TABLET DAILY     multivitamin tablet  Take 1 tablet by mouth daily.     Potassium Citrate 15 MEQ (1620  MG) Tbcr  Take 1 tablet by mouth 3 (three) times daily.     PREMARIN 0.3 MG tablet  Generic drug:  estrogens (conjugated)  TAKE 1 TABLET DAILY     SUMAtriptan 100 MG tablet  Commonly known as:  IMITREX  Take one pill by mouth for headache, may repeat once in 2 hours if needed, with maximum of 2 pills in one day     tolterodine 4 MG 24 hr capsule  Commonly known as:  DETROL LA  Take 1 capsule (4 mg total) by mouth daily.     URELLE 81 MG Tabs tablet  Take 1 tablet (81 mg total) by mouth 3 (three) times daily.     vitamin B-12 500 MCG tablet  Commonly known as:  CYANOCOBALAMIN  Take 500 mcg by mouth daily.

## 2013-11-01 NOTE — Progress Notes (Signed)
Pre-visit discussion using our clinic review tool. No additional management support is needed unless otherwise documented below in the visit note.  

## 2013-11-04 LAB — BODY FLUID CULTURE
Gram Stain: NONE SEEN
Organism ID, Bacteria: NO GROWTH

## 2013-11-16 ENCOUNTER — Other Ambulatory Visit: Payer: Self-pay | Admitting: Family Medicine

## 2014-02-14 ENCOUNTER — Other Ambulatory Visit: Payer: Self-pay | Admitting: Family Medicine

## 2014-02-14 NOTE — Telephone Encounter (Signed)
done

## 2014-02-14 NOTE — Telephone Encounter (Signed)
Please refill times 3 

## 2014-02-14 NOTE — Telephone Encounter (Signed)
Electronic refill request, please advise  

## 2014-03-04 ENCOUNTER — Ambulatory Visit (INDEPENDENT_AMBULATORY_CARE_PROVIDER_SITE_OTHER): Payer: 59 | Admitting: Family Medicine

## 2014-03-04 ENCOUNTER — Encounter: Payer: Self-pay | Admitting: Family Medicine

## 2014-03-04 VITALS — BP 132/84 | HR 77 | Temp 98.0°F | Ht 65.0 in | Wt 189.8 lb

## 2014-03-04 DIAGNOSIS — I10 Essential (primary) hypertension: Secondary | ICD-10-CM

## 2014-03-04 DIAGNOSIS — E781 Pure hyperglyceridemia: Secondary | ICD-10-CM

## 2014-03-04 DIAGNOSIS — E0590 Thyrotoxicosis, unspecified without thyrotoxic crisis or storm: Secondary | ICD-10-CM

## 2014-03-04 DIAGNOSIS — E669 Obesity, unspecified: Secondary | ICD-10-CM

## 2014-03-04 DIAGNOSIS — Z0000 Encounter for general adult medical examination without abnormal findings: Secondary | ICD-10-CM

## 2014-03-04 DIAGNOSIS — E039 Hypothyroidism, unspecified: Secondary | ICD-10-CM

## 2014-03-04 LAB — CBC WITH DIFFERENTIAL/PLATELET
BASOS PCT: 0.6 % (ref 0.0–3.0)
Basophils Absolute: 0 10*3/uL (ref 0.0–0.1)
EOS ABS: 0.4 10*3/uL (ref 0.0–0.7)
Eosinophils Relative: 4.4 % (ref 0.0–5.0)
HCT: 39.4 % (ref 36.0–46.0)
HEMOGLOBIN: 12.9 g/dL (ref 12.0–15.0)
LYMPHS ABS: 2.3 10*3/uL (ref 0.7–4.0)
Lymphocytes Relative: 26.4 % (ref 12.0–46.0)
MCHC: 32.9 g/dL (ref 30.0–36.0)
MCV: 86.4 fl (ref 78.0–100.0)
MONO ABS: 0.6 10*3/uL (ref 0.1–1.0)
Monocytes Relative: 6.6 % (ref 3.0–12.0)
NEUTROS ABS: 5.4 10*3/uL (ref 1.4–7.7)
Neutrophils Relative %: 62 % (ref 43.0–77.0)
Platelets: 453 10*3/uL — ABNORMAL HIGH (ref 150.0–400.0)
RBC: 4.56 Mil/uL (ref 3.87–5.11)
RDW: 13.6 % (ref 11.5–14.6)
WBC: 8.7 10*3/uL (ref 4.5–10.5)

## 2014-03-04 LAB — COMPREHENSIVE METABOLIC PANEL
ALK PHOS: 69 U/L (ref 39–117)
ALT: 23 U/L (ref 0–35)
AST: 28 U/L (ref 0–37)
Albumin: 4.3 g/dL (ref 3.5–5.2)
BILIRUBIN TOTAL: 0.5 mg/dL (ref 0.3–1.2)
BUN: 17 mg/dL (ref 6–23)
CO2: 24 mEq/L (ref 19–32)
Calcium: 9.3 mg/dL (ref 8.4–10.5)
Chloride: 103 mEq/L (ref 96–112)
Creatinine, Ser: 1 mg/dL (ref 0.4–1.2)
GFR: 58.79 mL/min — ABNORMAL LOW (ref >60.00)
Glucose, Bld: 105 mg/dL — ABNORMAL HIGH (ref 70–99)
Potassium: 4.2 mEq/L (ref 3.5–5.1)
Sodium: 137 mEq/L (ref 135–145)
Total Protein: 7.6 g/dL (ref 6.0–8.3)

## 2014-03-04 LAB — LIPID PANEL
CHOL/HDL RATIO: 4
Cholesterol: 231 mg/dL — ABNORMAL HIGH (ref 0–200)
HDL: 65.9 mg/dL (ref >39.00)
LDL Cholesterol: 108 mg/dL — ABNORMAL HIGH (ref 0–99)
Triglycerides: 285 mg/dL — ABNORMAL HIGH (ref 0.0–149.0)
VLDL: 57 mg/dL — ABNORMAL HIGH (ref 0.0–40.0)

## 2014-03-04 MED ORDER — SUMATRIPTAN SUCCINATE 100 MG PO TABS: ORAL_TABLET | ORAL | Status: DC

## 2014-03-04 MED ORDER — IRBESARTAN-HYDROCHLOROTHIAZIDE 150-12.5 MG PO TABS: 1.0000 | ORAL_TABLET | ORAL | Status: DC

## 2014-03-04 MED ORDER — PROPRANOLOL HCL ER BEADS 120 MG PO CP24: ORAL_CAPSULE | ORAL | Status: DC

## 2014-03-04 MED ORDER — TOLTERODINE TARTRATE ER 4 MG PO CP24: 4.0000 mg | ORAL_CAPSULE | Freq: Every day | ORAL | Status: DC

## 2014-03-04 MED ORDER — ESTROGENS CONJUGATED 0.3 MG PO TABS: ORAL_TABLET | ORAL | Status: DC

## 2014-03-04 MED ORDER — MONTELUKAST SODIUM 10 MG PO TABS: ORAL_TABLET | ORAL | Status: DC

## 2014-03-04 MED ORDER — FENOFIBRATE 145 MG PO TABS: 145.0000 mg | ORAL_TABLET | Freq: Every day | ORAL | Status: DC

## 2014-03-04 MED ORDER — ACYCLOVIR 200 MG PO CAPS: ORAL_CAPSULE | ORAL | Status: DC

## 2014-03-04 NOTE — Progress Notes (Signed)
Pre visit review using our clinic review tool, if applicable. No additional management support is needed unless otherwise documented below in the visit note. 

## 2014-03-04 NOTE — Progress Notes (Signed)
Subjective:    Patient ID: [name], female    DOB: [birth year], 63 y.o.   MRN: [id number]  HPI Here for health maintenance exam and to review chronic medical problems    Feeling good overall   Wt is down 7 lb with bmi of 31 Trying hard to loose  Gave up sugar (excess )- she missed it at first -doing better now  Also exercising -walking and gardening and uses a rebounder machine   Hx of hysterectomy with pap 9/05 Has no problems   Flu vaccine- had it this fall  Td 11/07  Mammogram 4/14- she needs to schedule it at [facility] hosp No lumps on self exam   colonosc 2/08- 10 year recall   Zoster vaccine 4/13  Due for labs - will do that today   Thyroid- no problems  Has appt with endocrine in 2 months  Just about done with it - we will do thyroid care after that   Mood- fine - retired now and happy all the time - very good and motivated   Patient Active Problem List   Diagnosis Date Noted  . Obesity 12/31/2011  . Other screening mammogram 12/31/2011  . Post-menopausal 12/31/2011  . Routine general medical examination at a health care facility 12/23/2011  . 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  . HYPOTHYROIDISM 11/08/2008  . FEVER BLISTER 09/23/2007  . HYPERTHYROIDISM 09/23/2007  . Hypertriglyceridemia 09/23/2007  . HYPERTENSION 09/23/2007  . ALLERGIC RHINITIS 09/23/2007  . ASTHMA 09/23/2007  . HYPOKALEMIA 04/16/2007  . ANEMIA NOS 04/16/2007  . LEUKOCYTOSIS NOS 04/09/2007  . THROMBOCYTOSIS 04/09/2007   Past Medical History  Diagnosis Date  . Allergic rhinitis   . HLD (hyperlipidemia)     borderline-  no meds  . Mixed incontinence     urge and stress incontinence  . History of kidney stones   . Herpes simplex without mention of complication     controlled w/ meds (no current fever blisters)  . Leukocytosis, unspecified hx of 10 yrs ago    no problem since  . Anemia, unspecified     b12 def.  . Asthma     controlled w/ singulair  .  Hyperthyroidism hx ptu 2006    takes synthroid  . HTN (hypertension)     echo- 2006  . Migraine   . Ureteral calculi right     s/p laser  litho w/ stone extraction 01-28-11  . Renal cyst right    Past Surgical History  Procedure Laterality Date  . Breast reduction surgery  1994  . Tonsillectomy  1960  . Vaginal hysterectomy  1987    fibroids/anemia  . Cervical fusion  2000    fusion c4-6  . Cervical fusion  2005    fusion c6-7 and plate removal P8-2  . Cystoscopy/retrograde/ureteroscopy/stone extraction with basket  01-28-11    right   . Cystoscopy w/ retrogrades  10/21/2011    Procedure: CYSTOSCOPY WITH RETROGRADE PYELOGRAM;  Surgeon: Ailene Rud, MD;  Location: [facility];  Service: Urology;  Laterality: Right;  CYSTOSCOPY RIGHT RETROGRADE, PYELOGRAM  URETEROSCOPY WITH HOLMIUM LASER AND STONE EXTRACTION   History  Substance Use Topics  . Smoking status: Never Smoker   . Smokeless tobacco: Never Used  . Alcohol Use: No   Family History  Problem Relation Age of Onset  . Heart failure Father   . Osteoporosis Mother   . Diabetes Mother   . Diabetes      Grandfather  .  Coronary artery disease      Grandmother   Allergies  Allergen Reactions  . Contrast Media [Iodinated Diagnostic Agents] Anaphylaxis  . Shellfish-Derived Products Anaphylaxis   Current Outpatient Prescriptions on File Prior to Visit  Medication Sig Dispense Refill  . acyclovir (ZOVIRAX) 200 MG capsule TAKE 2 CAPSULES DAILY  180 capsule  3  . calcium-vitamin D (OSCAL WITH D) 500-200 MG-UNIT per tablet Take 2 tablets by mouth daily.       . fenofibrate (TRICOR) 145 MG tablet Take 1 tablet (145 mg total) by mouth daily.  90 tablet  3  . INNOPRAN XL 120 MG 24 hr capsule TAKE 1 CAPSULE DAILY  90 capsule  1  . irbesartan-hydrochlorothiazide (AVALIDE) 150-12.5 MG per tablet Take 1 tablet by mouth every morning.  90 tablet  3  . levothyroxine (SYNTHROID, LEVOTHROID) 125 MCG tablet Take  125 mcg by mouth every morning.       Marland Kitchen liothyronine (CYTOMEL) 25 MCG tablet Take 25 mcg by mouth every evening.       . montelukast (SINGULAIR) 10 MG tablet TAKE 1 TABLET DAILY  90 tablet  2  . Multiple Vitamin (MULTIVITAMIN) tablet Take 1 tablet by mouth daily.       . Potassium Citrate 15 MEQ (1620 MG) TBCR Take 1 tablet by mouth 3 (three) times daily.  270 tablet  3  . PREMARIN 0.3 MG tablet TAKE 1 TABLET DAILY  90 tablet  1  . SUMAtriptan (IMITREX) 100 MG tablet TAKE 1 TABLET FOR HEADACHE MAY REPEAT ONCE IN 2 HOURS IF NEEDED. MAXIMUM OF 2    TABLETS IN ONE DAY.  27 tablet  3  . tolterodine (DETROL LA) 4 MG 24 hr capsule Take 1 capsule (4 mg total) by mouth daily.  90 capsule  3  . URELLE (URELLE/URISED) 81 MG TABS Take 1 tablet (81 mg total) by mouth 3 (three) times daily.  30 each  2  . vitamin B-12 (CYANOCOBALAMIN) 500 MCG tablet Take 500 mcg by mouth daily.        . [DISCONTINUED] DETROL LA 4 MG 24 hr capsule TAKE 1 CAPSULE DAILY  90 each  3   No current facility-administered medications on file prior to visit.      Review of Systems Review of Systems  Constitutional: Negative for fever, appetite change, fatigue and unexpected weight change.  Eyes: Negative for pain and visual disturbance.  Respiratory: Negative for cough and shortness of breath.   Cardiovascular: Negative for cp or palpitations    Gastrointestinal: Negative for nausea, diarrhea and constipation.  Genitourinary: Negative for urgency and frequency.  Skin: Negative for pallor or rash   Neurological: Negative for weakness, light-headedness, numbness and headaches.  Hematological: Negative for adenopathy. Does not bruise/bleed easily.  Psychiatric/Behavioral: Negative for dysphoric mood. The patient is not nervous/anxious.         Objective:   Physical Exam  Constitutional: She appears well-developed and well-nourished. No distress.  overwt and well app  HENT:  Head: Normocephalic and atraumatic.  Right Ear:  External ear normal.  Left Ear: External ear normal.  Mouth/Throat: Oropharynx is clear and moist.  Eyes: Conjunctivae and EOM are normal. Pupils are equal, round, and reactive to light. No scleral icterus.  Neck: Normal range of motion. Neck supple. No JVD present. Carotid bruit is not present.  Cardiovascular: Normal rate, regular rhythm, normal heart sounds and intact distal pulses.  Exam reveals no gallop.   Pulmonary/Chest: Effort normal and breath sounds  normal. No respiratory distress. She has no wheezes. She exhibits no tenderness.  Abdominal: Soft. Bowel sounds are normal. She exhibits no distension, no abdominal bruit and no mass. There is no tenderness.  Genitourinary: No breast swelling, tenderness, discharge or bleeding.  Breast exam: No mass, nodules, thickening, tenderness, bulging, retraction, inflamation, nipple discharge or skin changes noted.  No axillary or clavicular LA.      Musculoskeletal: Normal range of motion. She exhibits no edema and no tenderness.  Lymphadenopathy:    She has no cervical adenopathy.  Neurological: She is alert. She has normal reflexes. She displays no tremor. No cranial nerve deficit. She exhibits normal muscle tone. Coordination normal.  Skin: Skin is warm and dry. No rash noted. No erythema. No pallor.  Psychiatric: She has a normal mood and affect.  cheerful          Assessment & Plan:

## 2014-03-04 NOTE — Patient Instructions (Signed)
Take care of yourself  Continue healthy diet and exercise for weight loss  Don't forget to get your mammogram in April

## 2014-03-05 ENCOUNTER — Telehealth: Payer: Self-pay | Admitting: Family Medicine

## 2014-03-05 NOTE — Telephone Encounter (Signed)
Relevant patient education assigned to patient using Emmi. ° °

## 2014-03-06 NOTE — Assessment & Plan Note (Signed)
bp in fair control at this time  BP Readings from Last 1 Encounters:  03/04/14 132/84   No changes needed Disc lifstyle change with low sodium diet and exercise  Lab today

## 2014-03-06 NOTE — Assessment & Plan Note (Signed)
Pt will f/u with endocrine If all is stable/ euthryroid we can follow from here

## 2014-03-06 NOTE — Assessment & Plan Note (Signed)
Discussed how this problem influences overall health and the risks it imposes  Reviewed plan for weight loss with lower calorie diet (via better food choices and also portion control or program like weight watchers) and exercise building up to or more than 30 minutes 5 days per week including some aerobic activity    

## 2014-03-06 NOTE — Assessment & Plan Note (Signed)
Reviewed health habits including diet and exercise and skin cancer prevention Reviewed appropriate screening tests for age  Also reviewed health mt list, fam hx and immunization status , as well as social and family history   Lab drawn today

## 2014-03-06 NOTE — Assessment & Plan Note (Signed)
Pt will f/u with her endocrinologist as planned  If stable and euthyroid-we can follow from there No clinical changes

## 2014-03-06 NOTE — Assessment & Plan Note (Signed)
Lipids today tricor and diet  Rev low fat diet

## 2014-03-17 ENCOUNTER — Other Ambulatory Visit: Payer: Self-pay | Admitting: Family Medicine

## 2014-04-24 ENCOUNTER — Other Ambulatory Visit: Payer: Self-pay | Admitting: Family Medicine

## 2014-04-25 ENCOUNTER — Other Ambulatory Visit: Payer: Self-pay | Admitting: Family Medicine

## 2014-04-25 DIAGNOSIS — Z1231 Encounter for screening mammogram for malignant neoplasm of breast: Secondary | ICD-10-CM

## 2014-04-29 ENCOUNTER — Encounter (INDEPENDENT_AMBULATORY_CARE_PROVIDER_SITE_OTHER): Payer: Self-pay

## 2014-04-29 ENCOUNTER — Ambulatory Visit (HOSPITAL_COMMUNITY)
Admission: RE | Admit: 2014-04-29 | Discharge: 2014-04-29 | Disposition: A | Discharge status: Home / Self Care | Payer: 59 | Source: Ambulatory Visit | Attending: Family Medicine | Admitting: Family Medicine

## 2014-04-29 DIAGNOSIS — Z1231 Encounter for screening mammogram for malignant neoplasm of breast: Secondary | ICD-10-CM | POA: Insufficient documentation

## 2014-05-05 ENCOUNTER — Encounter: Payer: Self-pay | Admitting: *Deleted

## 2014-10-24 ENCOUNTER — Other Ambulatory Visit: Payer: Self-pay | Admitting: Family Medicine

## 2014-11-25 ENCOUNTER — Other Ambulatory Visit: Payer: Self-pay | Admitting: Family Medicine

## 2014-12-08 ENCOUNTER — Telehealth: Payer: Self-pay | Admitting: Family Medicine

## 2014-12-08 NOTE — Telephone Encounter (Signed)
Pt stopped by today to let us know that her insurance and pharmacy are changing. She is now on [employer] and [facility] will now be her mail order pharmacy. She will be getting her prescriptions every 90 days. Please make this change for her pharmacy. I have made a copy of both her insurance card and her [facility] card.

## 2014-12-12 NOTE — Telephone Encounter (Signed)
Pharmacy changed in computer 

## 2015-03-06 ENCOUNTER — Encounter: Payer: 59 | Admitting: Family Medicine

## 2015-03-20 ENCOUNTER — Ambulatory Visit (INDEPENDENT_AMBULATORY_CARE_PROVIDER_SITE_OTHER): Payer: BLUE CROSS/BLUE SHIELD | Admitting: Family Medicine

## 2015-03-20 ENCOUNTER — Encounter: Payer: Self-pay | Admitting: Family Medicine

## 2015-03-20 VITALS — BP 142/100 | HR 84 | Temp 98.2°F | Ht 65.5 in | Wt 170.8 lb

## 2015-03-20 DIAGNOSIS — E0590 Thyrotoxicosis, unspecified without thyrotoxic crisis or storm: Secondary | ICD-10-CM

## 2015-03-20 DIAGNOSIS — Z23 Encounter for immunization: Secondary | ICD-10-CM

## 2015-03-20 DIAGNOSIS — Z0000 Encounter for general adult medical examination without abnormal findings: Secondary | ICD-10-CM

## 2015-03-20 DIAGNOSIS — I10 Essential (primary) hypertension: Secondary | ICD-10-CM

## 2015-03-20 DIAGNOSIS — E781 Pure hyperglyceridemia: Secondary | ICD-10-CM

## 2015-03-20 LAB — COMPREHENSIVE METABOLIC PANEL
ALBUMIN: 4 g/dL (ref 3.5–5.2)
ALK PHOS: 83 U/L (ref 39–117)
ALT: 14 U/L (ref 0–35)
AST: 19 U/L (ref 0–37)
BUN: 15 mg/dL (ref 6–23)
CHLORIDE: 106 meq/L (ref 96–112)
CO2: 25 mEq/L (ref 19–32)
Calcium: 9.8 mg/dL (ref 8.4–10.5)
Creatinine, Ser: 0.74 mg/dL (ref 0.40–1.20)
GFR: 83.89 mL/min (ref >60.00)
GLUCOSE: 89 mg/dL (ref 70–99)
Potassium: 4 mEq/L (ref 3.5–5.1)
Sodium: 138 mEq/L (ref 135–145)
TOTAL PROTEIN: 7.4 g/dL (ref 6.0–8.3)
Total Bilirubin: 1 mg/dL (ref 0.2–1.2)

## 2015-03-20 LAB — LIPID PANEL
CHOL/HDL RATIO: 3
Cholesterol: 188 mg/dL (ref 0–200)
HDL: 63.9 mg/dL (ref >39.00)
LDL Cholesterol: 88 mg/dL (ref 0–99)
NonHDL: 124.1
TRIGLYCERIDES: 179 mg/dL — AB (ref 0.0–149.0)
VLDL: 35.8 mg/dL (ref 0.0–40.0)

## 2015-03-20 LAB — TSH: TSH: 0.28 u[IU]/mL — ABNORMAL LOW (ref 0.35–4.50)

## 2015-03-20 LAB — CBC WITH DIFFERENTIAL/PLATELET
BASOS PCT: 0.6 % (ref 0.0–3.0)
Basophils Absolute: 0 10*3/uL (ref 0.0–0.1)
EOS PCT: 2 % (ref 0.0–5.0)
Eosinophils Absolute: 0.1 10*3/uL (ref 0.0–0.7)
HEMATOCRIT: 41 % (ref 36.0–46.0)
HEMOGLOBIN: 13.8 g/dL (ref 12.0–15.0)
Lymphocytes Relative: 30.2 % (ref 12.0–46.0)
Lymphs Abs: 2.2 10*3/uL (ref 0.7–4.0)
MCHC: 33.6 g/dL (ref 30.0–36.0)
MCV: 84.2 fl (ref 78.0–100.0)
MONO ABS: 0.5 10*3/uL (ref 0.1–1.0)
Monocytes Relative: 7.1 % (ref 3.0–12.0)
NEUTROS PCT: 60.1 % (ref 43.0–77.0)
Neutro Abs: 4.4 10*3/uL (ref 1.4–7.7)
Platelets: 381 10*3/uL (ref 150.0–400.0)
RBC: 4.87 Mil/uL (ref 3.87–5.11)
RDW: 12.5 % (ref 11.5–15.5)
WBC: 7.3 10*3/uL (ref 4.0–10.5)

## 2015-03-20 MED ORDER — MONTELUKAST SODIUM 10 MG PO TABS: 10.0000 mg | ORAL_TABLET | Freq: Every day | ORAL | Status: DC

## 2015-03-20 MED ORDER — ESTROGENS CONJUGATED 0.3 MG PO TABS: ORAL_TABLET | ORAL | Status: DC

## 2015-03-20 MED ORDER — SUMATRIPTAN SUCCINATE 100 MG PO TABS: ORAL_TABLET | ORAL | Status: DC

## 2015-03-20 MED ORDER — ACYCLOVIR 200 MG PO CAPS: ORAL_CAPSULE | ORAL | Status: DC

## 2015-03-20 MED ORDER — PROPRANOLOL HCL ER BEADS 120 MG PO CP24: ORAL_CAPSULE | ORAL | Status: DC

## 2015-03-20 MED ORDER — POTASSIUM CITRATE ER 15 MEQ (1620 MG) PO TBCR: 1.0000 | EXTENDED_RELEASE_TABLET | Freq: Three times a day (TID) | ORAL | Status: DC

## 2015-03-20 MED ORDER — FENOFIBRATE 145 MG PO TABS: 145.0000 mg | ORAL_TABLET | Freq: Every day | ORAL | Status: DC

## 2015-03-20 MED ORDER — TOLTERODINE TARTRATE ER 4 MG PO CP24: 4.0000 mg | ORAL_CAPSULE | Freq: Every day | ORAL | Status: DC

## 2015-03-20 MED ORDER — IRBESARTAN-HYDROCHLOROTHIAZIDE 150-12.5 MG PO TABS: 1.0000 | ORAL_TABLET | ORAL | Status: DC

## 2015-03-20 NOTE — Patient Instructions (Signed)
Keep an eye on your blood pressure  Labs today  Don't forget to schedule your mammogram  Tetanus shot today  (Tdap)

## 2015-03-20 NOTE — Progress Notes (Signed)
Pre visit review using our clinic review tool, if applicable. No additional management support is needed unless otherwise documented below in the visit note. 

## 2015-03-20 NOTE — Progress Notes (Signed)
Subjective:    Patient ID: [name], female    DOB: [birth year], 64 y.o.   MRN: [id number]  HPI Here for health maintenance exam and to review chronic medical problems    Wt is down 19 lb with bmi of 27 Working hard on weight loss  Also less appetite with stress Watching sugar intake and exercising (yardwork)    bp is high today -she thinks that is stress related  She checks it outside the office -and it is "fine"  BP Readings from Last 3 Encounters:  03/20/15 142/100  03/04/14 132/84  11/01/13 104/80     ? HIV screen- not high risk- declines  [facility] her own appt Self exam- no lumps   Flu shot- got that in the fall   Td 11/07- would like to update that today  Works outdoors   [employer] 2/08 - normal   Zoster vaccine 4/13  dexa nl 1/13   Hx of hyperthyroidism On cytomel Has "finally stabilized"   HRT-on premarin  Staying on those -low dose / aware of the risk of breast cancer but cannot tolerate symptoms off of it   Lots of stress - caring for her mother with leukemia  Tearful at time   Needs screening labs today   Patient Active Problem List   Diagnosis Date Noted  . Other screening mammogram 12/31/2011  . Post-menopausal 12/31/2011  . Routine general medical examination at a health care facility 12/23/2011  . 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  . HYPOTHYROIDISM 11/08/2008  . FEVER BLISTER 09/23/2007  . Hyperthyroidism 09/23/2007  . Hypertriglyceridemia 09/23/2007  . Essential hypertension 09/23/2007  . ALLERGIC RHINITIS 09/23/2007  . ASTHMA 09/23/2007  . HYPOKALEMIA 04/16/2007  . ANEMIA NOS 04/16/2007  . LEUKOCYTOSIS NOS 04/09/2007  . THROMBOCYTOSIS 04/09/2007   Past Medical History  Diagnosis Date  . Allergic rhinitis   . HLD (hyperlipidemia)     borderline-  no meds  . Mixed incontinence     urge and stress incontinence  . History of kidney stones   . Herpes simplex without  mention of complication     controlled w/ meds (no current fever blisters)  . Leukocytosis, unspecified hx of 10 yrs ago    no problem since  . Anemia, unspecified     b12 def.  . Asthma     controlled w/ singulair  . Hyperthyroidism hx ptu 2006    takes synthroid  . HTN (hypertension)     echo- 2006  . Migraine   . Ureteral calculi right     s/p laser  litho w/ stone extraction 01-28-11  . Renal cyst right    Past Surgical History  Procedure Laterality Date  . Breast reduction surgery  1994  . Tonsillectomy  1960  . Vaginal hysterectomy  1987    fibroids/anemia  . Cervical fusion  2000    fusion c4-6  . Cervical fusion  2005    fusion c6-7 and plate removal A3-5  . Cystoscopy/retrograde/ureteroscopy/stone extraction with basket  01-28-11    right   . Cystoscopy w/ retrogrades  10/21/2011    Procedure: CYSTOSCOPY WITH RETROGRADE PYELOGRAM;  Surgeon: Ailene Rud, MD;  Location: [facility];  Service: Urology;  Laterality: Right;  CYSTOSCOPY RIGHT RETROGRADE, PYELOGRAM  URETEROSCOPY WITH HOLMIUM LASER AND STONE EXTRACTION   History  Substance Use Topics  . Smoking status: Never Smoker   . Smokeless tobacco: Never Used  .  Alcohol Use: No   Family History  Problem Relation Age of Onset  . Heart failure Father   . Osteoporosis Mother   . Diabetes Mother   . Diabetes      Grandfather  . Coronary artery disease      Grandmother   Allergies  Allergen Reactions  . Contrast Media [Iodinated Diagnostic Agents] Anaphylaxis  . Shellfish-Derived Products Anaphylaxis   Current Outpatient Prescriptions on File Prior to Visit  Medication Sig Dispense Refill  . acyclovir (ZOVIRAX) 200 MG capsule TAKE 2 CAPSULES DAILY 180 capsule 3  . calcium-vitamin D (OSCAL WITH D) 500-200 MG-UNIT per tablet Take 2 tablets by mouth daily.     Marland Kitchen estrogens, conjugated, (PREMARIN) 0.3 MG tablet TAKE 1 TABLET DAILY 90 tablet 3  . fenofibrate (TRICOR) 145 MG tablet Take 1  tablet (145 mg total) by mouth daily. 90 tablet 3  . irbesartan-hydrochlorothiazide (AVALIDE) 150-12.5 MG per tablet Take 1 tablet by mouth every morning. 90 tablet 3  . levothyroxine (SYNTHROID, LEVOTHROID) 125 MCG tablet Take 125 mcg by mouth every morning.     Marland Kitchen liothyronine (CYTOMEL) 25 MCG tablet Take 25 mcg by mouth every evening.     . montelukast (SINGULAIR) 10 MG tablet TAKE 1 TABLET DAILY 90 tablet 0  . Multiple Vitamin (MULTIVITAMIN) tablet Take 1 tablet by mouth daily.     . Potassium Citrate 15 MEQ (1620 MG) TBCR Take 1 tablet by mouth 3 (three) times daily. 270 tablet 3  . propranolol (INNOPRAN XL) 120 MG 24 hr capsule TAKE 1 CAPSULE DAILY 90 capsule 3  . SUMAtriptan (IMITREX) 100 MG tablet TAKE 1 TABLET FOR HEADACHE MAY REPEAT ONCE IN 2 HOURS IF NEEDED. MAXIMUM OF 2    TABLETS IN ONE DAY. 27 tablet 3  . tolterodine (DETROL LA) 4 MG 24 hr capsule Take 1 capsule (4 mg total) by mouth daily. 90 capsule 3  . URELLE (URELLE/URISED) 81 MG TABS Take 1 tablet (81 mg total) by mouth 3 (three) times daily. 30 each 2  . vitamin B-12 (CYANOCOBALAMIN) 500 MCG tablet Take 500 mcg by mouth daily.       No current facility-administered medications on file prior to visit.       Review of Systems Review of Systems  Constitutional: Negative for fever, appetite change, fatigue and unexpected weight change.  Eyes: Negative for pain and visual disturbance.  Respiratory: Negative for cough and shortness of breath.   Cardiovascular: Negative for cp or palpitations    Gastrointestinal: Negative for nausea, diarrhea and constipation.  Genitourinary: Negative for urgency and frequency.  Skin: Negative for pallor or rash   Neurological: Negative for weakness, light-headedness, numbness and headaches.  Hematological: Negative for adenopathy. Does not bruise/bleed easily.  Psychiatric/Behavioral: Negative for dysphoric mood. The patient is not nervous/anxious.  pos for stressors        Objective:     Physical Exam  Constitutional: She appears well-developed and well-nourished. No distress.  Wt loss noted   HENT:  Head: Normocephalic and atraumatic.  Right Ear: External ear normal.  Left Ear: External ear normal.  Mouth/Throat: Oropharynx is clear and moist.  Eyes: Conjunctivae and EOM are normal. Pupils are equal, round, and reactive to light. No scleral icterus.  Neck: Normal range of motion. Neck supple. No JVD present. Carotid bruit is not present. No thyromegaly present.  Cardiovascular: Normal rate, regular rhythm, normal heart sounds and intact distal pulses.  Exam reveals no gallop.   Pulmonary/Chest: Effort normal and  breath sounds normal. No respiratory distress. She has no wheezes. She exhibits no tenderness.  Abdominal: Soft. Bowel sounds are normal. She exhibits no distension, no abdominal bruit and no mass. There is no tenderness.  Genitourinary: No breast swelling, tenderness, discharge or bleeding.  Breast exam: No mass, nodules, thickening, tenderness, bulging, retraction, inflamation, nipple discharge or skin changes noted.  No axillary or clavicular LA.      Musculoskeletal: Normal range of motion. She exhibits no edema or tenderness.  Lymphadenopathy:    She has no cervical adenopathy.  Neurological: She is alert. She has normal reflexes. No cranial nerve deficit. She exhibits normal muscle tone. Coordination normal.  Skin: Skin is warm and dry. No rash noted. No erythema. No pallor.  Psychiatric: She has a normal mood and affect.  occ tearful when disc care of her mother           Assessment & Plan:   Problem List Items Addressed This Visit      Cardiovascular and Mediastinum   Essential hypertension   Relevant Medications   fenofibrate (TRICOR) tablet   irbesartan-hydrochlorothiazide (AVALIDE) 150-12.5 MG per tablet   propranolol (INNOPRAN XL) 24 hr capsule     Endocrine   Hyperthyroidism   Relevant Medications   propranolol (INNOPRAN XL) 24 hr  capsule     Other   Hypertriglyceridemia   Relevant Medications   fenofibrate (TRICOR) tablet   irbesartan-hydrochlorothiazide (AVALIDE) 150-12.5 MG per tablet   propranolol (INNOPRAN XL) 24 hr capsule   Routine general medical examination at a health care facility - Primary   Relevant Orders   CBC with Differential/Platelet (Completed)   Comprehensive metabolic panel (Completed)   TSH (Completed)   Lipid panel (Completed)   Tdap vaccine greater than or equal to 7yo IM (Completed)    Other Visit Diagnoses    Need for vaccination        Relevant Orders    Tdap vaccine greater than or equal to 7yo IM (Completed)

## 2015-03-21 ENCOUNTER — Telehealth: Payer: Self-pay

## 2015-03-21 NOTE — Telephone Encounter (Signed)
Patient aware of lab results.

## 2015-03-21 NOTE — Telephone Encounter (Signed)
-----   Message from Abner Greenspan, MD sent at 03/20/2015  9:18 PM EDT ----- Cholesterol improved tsh is low-please send copy to her endocrinologist  Other labs stable

## 2015-03-23 ENCOUNTER — Telehealth: Payer: Self-pay

## 2015-03-23 MED ORDER — PROPRANOLOL HCL ER 120 MG PO CP24: 120.0000 mg | ORAL_CAPSULE | Freq: Every day | ORAL | Status: DC

## 2015-03-23 NOTE — Telephone Encounter (Signed)
[facility] said received rx for propranolol 120 with Innopran XL in parenthesis. [facility] said their system will fill Innopran ( does not have generic sub) with cost to pt to over $600.00. Needs cb to substitute propranolol or Inderal LA and pt can get generic. Also pt is taking Detrol which can cause K to be in GI tract for longer period; is it OK to fill the K rx. When cb use ref # [id number].

## 2015-03-23 NOTE — Telephone Encounter (Signed)
subst Inderal LA please if that is ok with pt  Please refill other meds-aware and labs are ok  Thanks

## 2015-03-23 NOTE — Telephone Encounter (Signed)
[facility] rx [facility]  Informed [facility] that Dr Glori Bickers is aware of risk and side effects and will continue to monitor patient.

## 2015-05-01 ENCOUNTER — Other Ambulatory Visit: Payer: Self-pay | Admitting: Family Medicine

## 2015-05-01 DIAGNOSIS — Z1231 Encounter for screening mammogram for malignant neoplasm of breast: Secondary | ICD-10-CM

## 2015-05-05 ENCOUNTER — Ambulatory Visit (HOSPITAL_COMMUNITY)
Admission: RE | Admit: 2015-05-05 | Discharge: 2015-05-05 | Disposition: A | Discharge status: Home / Self Care | Payer: BLUE CROSS/BLUE SHIELD | Source: Ambulatory Visit | Attending: Family Medicine | Admitting: Family Medicine

## 2015-05-05 DIAGNOSIS — Z1231 Encounter for screening mammogram for malignant neoplasm of breast: Secondary | ICD-10-CM | POA: Diagnosis present

## 2015-05-05 LAB — HM MAMMOGRAPHY: HM Mammogram: NORMAL

## 2015-05-09 ENCOUNTER — Encounter: Payer: Self-pay | Admitting: *Deleted

## 2015-05-09 ENCOUNTER — Encounter: Payer: Self-pay | Admitting: Family Medicine

## 2015-06-01 ENCOUNTER — Other Ambulatory Visit: Payer: Self-pay | Admitting: Family Medicine

## 2015-11-28 ENCOUNTER — Other Ambulatory Visit: Payer: Self-pay | Admitting: Family Medicine

## 2015-11-28 DIAGNOSIS — Z23 Encounter for immunization: Secondary | ICD-10-CM | POA: Diagnosis not present

## 2016-03-24 ENCOUNTER — Telehealth: Payer: Self-pay | Admitting: Family Medicine

## 2016-03-24 DIAGNOSIS — E781 Pure hyperglyceridemia: Secondary | ICD-10-CM

## 2016-03-24 DIAGNOSIS — E039 Hypothyroidism, unspecified: Secondary | ICD-10-CM

## 2016-03-24 DIAGNOSIS — I10 Essential (primary) hypertension: Secondary | ICD-10-CM

## 2016-03-24 NOTE — Telephone Encounter (Signed)
-----   Message from Ellamae Sia sent at 03/21/2016  4:23 PM EDT ----- Regarding: Lab orders for Friday, 4.21.17 Patient is scheduled for CPX labs, please order future labs, Thanks , Karna Christmas

## 2016-03-29 ENCOUNTER — Other Ambulatory Visit (INDEPENDENT_AMBULATORY_CARE_PROVIDER_SITE_OTHER): Payer: Medicare Other

## 2016-03-29 DIAGNOSIS — E039 Hypothyroidism, unspecified: Secondary | ICD-10-CM

## 2016-03-29 DIAGNOSIS — E781 Pure hyperglyceridemia: Secondary | ICD-10-CM | POA: Diagnosis not present

## 2016-03-29 DIAGNOSIS — I10 Essential (primary) hypertension: Secondary | ICD-10-CM

## 2016-03-29 LAB — LIPID PANEL
CHOLESTEROL: 209 mg/dL — AB (ref 0–200)
HDL: 72.2 mg/dL (ref >39.00)
NonHDL: 136.3
Total CHOL/HDL Ratio: 3
Triglycerides: 207 mg/dL — ABNORMAL HIGH (ref 0.0–149.0)
VLDL: 41.4 mg/dL — ABNORMAL HIGH (ref 0.0–40.0)

## 2016-03-29 LAB — CBC WITH DIFFERENTIAL/PLATELET
BASOS ABS: 0.1 10*3/uL (ref 0.0–0.1)
BASOS PCT: 0.8 % (ref 0.0–3.0)
EOS ABS: 0.2 10*3/uL (ref 0.0–0.7)
Eosinophils Relative: 2.4 % (ref 0.0–5.0)
HEMATOCRIT: 40.2 % (ref 36.0–46.0)
Hemoglobin: 13.4 g/dL (ref 12.0–15.0)
LYMPHS PCT: 34.1 % (ref 12.0–46.0)
Lymphs Abs: 2.8 10*3/uL (ref 0.7–4.0)
MCHC: 33.4 g/dL (ref 30.0–36.0)
MCV: 85.5 fl (ref 78.0–100.0)
Monocytes Absolute: 0.5 10*3/uL (ref 0.1–1.0)
Monocytes Relative: 6.5 % (ref 3.0–12.0)
Neutro Abs: 4.6 10*3/uL (ref 1.4–7.7)
Neutrophils Relative %: 56.2 % (ref 43.0–77.0)
Platelets: 496 10*3/uL — ABNORMAL HIGH (ref 150.0–400.0)
RBC: 4.7 Mil/uL (ref 3.87–5.11)
RDW: 13.1 % (ref 11.5–15.5)
WBC: 8.1 10*3/uL (ref 4.0–10.5)

## 2016-03-29 LAB — COMPREHENSIVE METABOLIC PANEL
ALBUMIN: 4.5 g/dL (ref 3.5–5.2)
ALK PHOS: 60 U/L (ref 39–117)
ALT: 12 U/L (ref 0–35)
AST: 14 U/L (ref 0–37)
BILIRUBIN TOTAL: 0.5 mg/dL (ref 0.2–1.2)
BUN: 27 mg/dL — ABNORMAL HIGH (ref 6–23)
CO2: 26 meq/L (ref 19–32)
CREATININE: 1.06 mg/dL (ref 0.40–1.20)
Calcium: 9.7 mg/dL (ref 8.4–10.5)
Chloride: 100 mEq/L (ref 96–112)
GFR: 55.24 mL/min — ABNORMAL LOW (ref >60.00)
Glucose, Bld: 104 mg/dL — ABNORMAL HIGH (ref 70–99)
Potassium: 4 mEq/L (ref 3.5–5.1)
Sodium: 137 mEq/L (ref 135–145)
Total Protein: 7.6 g/dL (ref 6.0–8.3)

## 2016-03-29 LAB — LDL CHOLESTEROL, DIRECT: Direct LDL: 112 mg/dL

## 2016-03-29 LAB — TSH: TSH: 13.62 u[IU]/mL — ABNORMAL HIGH (ref 0.35–4.50)

## 2016-04-03 ENCOUNTER — Ambulatory Visit (INDEPENDENT_AMBULATORY_CARE_PROVIDER_SITE_OTHER): Payer: Medicare Other | Admitting: Family Medicine

## 2016-04-03 ENCOUNTER — Encounter: Payer: Self-pay | Admitting: Family Medicine

## 2016-04-03 VITALS — BP 132/80 | HR 89 | Temp 98.7°F | Ht 65.5 in | Wt 181.0 lb

## 2016-04-03 DIAGNOSIS — E039 Hypothyroidism, unspecified: Secondary | ICD-10-CM | POA: Diagnosis not present

## 2016-04-03 DIAGNOSIS — B9789 Other viral agents as the cause of diseases classified elsewhere: Secondary | ICD-10-CM

## 2016-04-03 DIAGNOSIS — D473 Essential (hemorrhagic) thrombocythemia: Secondary | ICD-10-CM

## 2016-04-03 DIAGNOSIS — E781 Pure hyperglyceridemia: Secondary | ICD-10-CM

## 2016-04-03 DIAGNOSIS — Z0000 Encounter for general adult medical examination without abnormal findings: Secondary | ICD-10-CM | POA: Diagnosis not present

## 2016-04-03 DIAGNOSIS — J069 Acute upper respiratory infection, unspecified: Secondary | ICD-10-CM | POA: Insufficient documentation

## 2016-04-03 DIAGNOSIS — D75839 Thrombocytosis, unspecified: Secondary | ICD-10-CM

## 2016-04-03 DIAGNOSIS — I10 Essential (primary) hypertension: Secondary | ICD-10-CM

## 2016-04-03 DIAGNOSIS — Z780 Asymptomatic menopausal state: Secondary | ICD-10-CM

## 2016-04-03 MED ORDER — PROPRANOLOL HCL ER 120 MG PO CP24: 120.0000 mg | ORAL_CAPSULE | Freq: Every day | ORAL | Status: DC

## 2016-04-03 MED ORDER — FENOFIBRATE 145 MG PO TABS: 145.0000 mg | ORAL_TABLET | Freq: Every day | ORAL | Status: DC

## 2016-04-03 MED ORDER — IRBESARTAN-HYDROCHLOROTHIAZIDE 150-12.5 MG PO TABS: 1.0000 | ORAL_TABLET | ORAL | Status: DC

## 2016-04-03 MED ORDER — ACYCLOVIR 200 MG PO CAPS: ORAL_CAPSULE | ORAL | Status: DC

## 2016-04-03 MED ORDER — POTASSIUM CITRATE ER 15 MEQ (1620 MG) PO TBCR: 1.0000 | EXTENDED_RELEASE_TABLET | Freq: Three times a day (TID) | ORAL | Status: DC

## 2016-04-03 MED ORDER — MIRABEGRON ER 50 MG PO TB24
50.0000 mg | ORAL_TABLET | Freq: Every day | ORAL | Status: DC
Start: 2016-04-03 — End: 2017-04-09

## 2016-04-03 MED ORDER — MONTELUKAST SODIUM 10 MG PO TABS: 10.0000 mg | ORAL_TABLET | Freq: Every day | ORAL | Status: DC

## 2016-04-03 NOTE — Progress Notes (Signed)
Subjective:    Patient ID: [name], female    DOB: [birth year], 65 y.o.   MRN: [id number]  HPI Here for annual medicare wellness visit as well as chronic/acute medical problems as well as annual preventative exam  I have personally reviewed the Medicare Annual Wellness questionnaire and have noted 1. The patient's medical and social history 2. Their use of alcohol, tobacco or illicit drugs 3. Their current medications and supplements 4. The patient's functional ability including ADL's, fall risks, home safety risks and hearing or visual             impairment. 5. Diet and physical activities 6. Evidence for depression or mood disorders  The patients weight, height, BMI have been recorded in the chart and visual acuity is per eye clinic.  I have made referrals, counseling and provided education to the patient based review of the above and I have provided the pt with a written personalized care plan for preventive services. Reviewed and updated provider list, see scanned forms.  See scanned forms.  Routine anticipatory guidance given to patient.  See health maintenance. Colon cancer screening 2/08 normal - 10 year recall Breast cancer screening 5/16 - just got the letter - will make her own appt Self breast exam-no lumps  Flu vaccine 8/16 Tetanus vaccine 4.16 Pneumovax prevnar 12/16 - will be due in 12/17 for PNA  23  Zoster vaccine 4/13 dexa 1/13 - normal No falls or fractures  Has thyroid issues and fam hx of OP  Is taking ca and D Advance directive-has a living will and POA  Cognitive function addressed- see scanned forms- and if abnormal then additional documentation follows. occ forgets names / tip of tongue/ does not misplace things   PMH and SH reviewed  Meds, vitals, and allergies reviewed.   ROS: See HPI.  Otherwise negative.       Hx of hyper and now hypothyroidism Lab Results  Component Value Date   TSH 13.62* 03/29/2016   Sees endocrinology Dr  Ronnald Collum , next f/u is nov  No fatigue  On cytomel  No clinical changes   bp is stable today  No cp or palpitations or headaches or edema  No side effects to medicines  BP Readings from Last 3 Encounters:  04/03/16 142/92  03/20/15 142/100  03/04/14 132/84    Has not checked outside the office  Feels ok    Hx of high platelet count Lab Results  Component Value Date   WBC 8.1 03/29/2016   HGB 13.4 03/29/2016   HCT 40.2 03/29/2016   MCV 85.5 03/29/2016   PLT 496.0* 03/29/2016   watching Does have illness currently  Also thyroid antibodies   104 blood glucose  Does not eat a lot of sweets or sugar  Drinks 8 glasses per day of water    Hypertriglyceridemia Lab Results  Component Value Date   CHOL 209* 03/29/2016   CHOL 188 03/20/2015   CHOL 231* 03/04/2014   Lab Results  Component Value Date   HDL 72.20 03/29/2016   HDL 63.90 03/20/2015   HDL 65.90 03/04/2014   Lab Results  Component Value Date   LDLCALC 88 03/20/2015   LDLCALC 108* 03/04/2014   Lab Results  Component Value Date   TRIG 207.0* 03/29/2016   TRIG 179.0* 03/20/2015   TRIG 285.0* 03/04/2014   Lab Results  Component Value Date   CHOLHDL 3 03/29/2016   CHOLHDL 3 03/20/2015   CHOLHDL 4 03/04/2014  Lab Results  Component Value Date   LDLDIRECT 112.0 03/29/2016   LDLDIRECT 123.4 06/21/2013   LDLDIRECT 114.2 04/13/2013   On tricor and diet -- no missed doses  Trig are up a bit  Diet is pretty good  Not a lot of fried food or red meat    Has had uti for a week- getting better   Cough and st  Low grade temp   Patient Active Problem List   Diagnosis Date Noted  . Other screening mammogram 12/31/2011  . Post-menopausal 12/31/2011  . Routine general medical examination at a health care facility 12/23/2011  . 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  . Hypothyroidism 11/08/2008  . FEVER BLISTER 09/23/2007  . Hyperthyroidism 09/23/2007  . Hypertriglyceridemia 09/23/2007  .  Essential hypertension 09/23/2007  . ALLERGIC RHINITIS 09/23/2007  . ASTHMA 09/23/2007  . HYPOKALEMIA 04/16/2007  . ANEMIA NOS 04/16/2007  . LEUKOCYTOSIS NOS 04/09/2007  . Thrombocytosis ([place]) 04/09/2007   Past Medical History  Diagnosis Date  . Allergic rhinitis   . HLD (hyperlipidemia)     borderline-  no meds  . Mixed incontinence     urge and stress incontinence  . History of kidney stones   . Herpes simplex without mention of complication     controlled w/ meds (no current fever blisters)  . Leukocytosis, unspecified hx of 10 yrs ago    no problem since  . Anemia, unspecified     b12 def.  . Asthma     controlled w/ singulair  . Hyperthyroidism hx ptu 2006    takes synthroid  . HTN (hypertension)     echo- 2006  . Migraine   . Ureteral calculi right     s/p laser  litho w/ stone extraction 01-28-11  . Renal cyst right    Past Surgical History  Procedure Laterality Date  . Breast reduction surgery  1994  . Tonsillectomy  1960  . Vaginal hysterectomy  1987    fibroids/anemia  . Cervical fusion  2000    fusion c4-6  . Cervical fusion  2005    fusion c6-7 and plate removal 624THL  . Cystoscopy/retrograde/ureteroscopy/stone extraction with basket  01-28-11    right   . Cystoscopy w/ retrogrades  10/21/2011    Procedure: CYSTOSCOPY WITH RETROGRADE PYELOGRAM;  Surgeon: Ailene Rud, MD;  Location: [facility];  Service: Urology;  Laterality: Right;  CYSTOSCOPY RIGHT RETROGRADE, PYELOGRAM  URETEROSCOPY WITH HOLMIUM LASER AND STONE EXTRACTION   Social History  Substance Use Topics  . Smoking status: Never Smoker   . Smokeless tobacco: Never Used  . Alcohol Use: No   Family History  Problem Relation Age of Onset  . Heart failure Father   . Osteoporosis Mother   . Diabetes Mother   . Diabetes      Grandfather  . Coronary artery disease      Grandmother   Allergies  Allergen Reactions  . Contrast Media [Iodinated Diagnostic Agents]  Anaphylaxis  . Shellfish-Derived Products Anaphylaxis   Current Outpatient Prescriptions on File Prior to Visit  Medication Sig Dispense Refill  . acyclovir (ZOVIRAX) 200 MG capsule TAKE 2 CAPSULES DAILY 180 capsule 3  . calcium-vitamin D (OSCAL WITH D) 500-200 MG-UNIT per tablet Take 2 tablets by mouth daily.     Marland Kitchen estrogens, conjugated, (PREMARIN) 0.3 MG tablet TAKE 1 TABLET DAILY 90 tablet 3  . fenofibrate (TRICOR) 145 MG tablet Take 1 tablet (145 mg total) by mouth daily. 90 tablet  3  . irbesartan-hydrochlorothiazide (AVALIDE) 150-12.5 MG per tablet Take 1 tablet by mouth every morning. 90 tablet 3  . levothyroxine (SYNTHROID, LEVOTHROID) 125 MCG tablet Take 125 mcg by mouth every morning.     Marland Kitchen liothyronine (CYTOMEL) 25 MCG tablet Take 25 mcg by mouth every evening.     . montelukast (SINGULAIR) 10 MG tablet TAKE 1 TABLET DAILY 90 tablet 0  . Multiple Vitamin (MULTIVITAMIN) tablet Take 1 tablet by mouth daily.     . Potassium Citrate 15 MEQ (1620 MG) TBCR Take 1 tablet by mouth 3 (three) times daily. 270 tablet 3  . propranolol (INNOPRAN XL) 120 MG 24 hr capsule TAKE 1 CAPSULE DAILY 90 capsule 3  . propranolol ER (INDERAL LA) 120 MG 24 hr capsule Take 1 capsule (120 mg total) by mouth daily. 90 capsule 3  . SUMAtriptan (IMITREX) 100 MG tablet TAKE 1 TABLET FOR HEADACHE MAY REPEAT ONCE IN 2 HOURS IF NEEDED. MAXIMUM OF 2    TABLETS IN ONE DAY. 27 tablet 3  . tolterodine (DETROL LA) 4 MG 24 hr capsule Take 1 capsule (4 mg total) by mouth daily. 90 capsule 3  . URELLE (URELLE/URISED) 81 MG TABS Take 1 tablet (81 mg total) by mouth 3 (three) times daily. 30 each 2  . vitamin B-12 (CYANOCOBALAMIN) 500 MCG tablet Take 500 mcg by mouth daily.       No current facility-administered medications on file prior to visit.    Review of Systems Review of Systems  Constitutional: Negative for fever, appetite change,  and unexpected weight change.  Eyes: Negative for pain and visual disturbance.    Respiratory: Negative for cough and shortness of breath.   Cardiovascular: Negative for cp or palpitations    Gastrointestinal: Negative for nausea, diarrhea and constipation.  Genitourinary: Negative for urgency and frequency. pos for hot flashes Skin: Negative for pallor or rash   Neurological: Negative for weakness, light-headedness, numbness and headaches.  Hematological: Negative for adenopathy. Does not bruise/bleed easily.  Psychiatric/Behavioral: Negative for dysphoric mood. The patient is not nervous/anxious.         Objective:   Physical Exam  Constitutional: She appears well-developed and well-nourished. No distress.  overwt and well app  HENT:  Head: Normocephalic and atraumatic.  Right Ear: External ear normal.  Left Ear: External ear normal.  Mouth/Throat: Oropharynx is clear and moist.  Eyes: Conjunctivae and EOM are normal. Pupils are equal, round, and reactive to light. No scleral icterus.  Neck: Normal range of motion. Neck supple. No JVD present. Carotid bruit is not present. No thyromegaly present.  Cardiovascular: Normal rate, regular rhythm, normal heart sounds and intact distal pulses.  Exam reveals no gallop.   Pulmonary/Chest: Effort normal and breath sounds normal. No respiratory distress. She has no wheezes. She exhibits no tenderness.  Abdominal: Soft. Bowel sounds are normal. She exhibits no distension, no abdominal bruit and no mass. There is no tenderness.  Genitourinary: No breast swelling, tenderness, discharge or bleeding.  Breast exam: No mass, nodules, thickening, tenderness, bulging, retraction, inflamation, nipple discharge or skin changes noted.  No axillary or clavicular LA.      Musculoskeletal: Normal range of motion. She exhibits no edema or tenderness.  Lymphadenopathy:    She has no cervical adenopathy.  Neurological: She is alert. She has normal reflexes. No cranial nerve deficit. She exhibits normal muscle tone. Coordination normal.   Skin: Skin is warm and dry. No rash noted. No erythema. No pallor.  Some solar  lentigines  Psychiatric: She has a normal mood and affect.          Assessment & Plan:   Problem List Items Addressed This Visit      Cardiovascular and Mediastinum   Essential hypertension - Primary    bp in fair control at this time  BP Readings from Last 1 Encounters:  04/03/16 132/80   No changes needed Disc lifstyle change with low sodium diet and exercise  Labs reviewed       Relevant Medications   propranolol ER (INDERAL LA) 120 MG 24 hr capsule   irbesartan-hydrochlorothiazide (AVALIDE) 150-12.5 MG tablet   fenofibrate (TRICOR) 145 MG tablet     Respiratory   Viral URI with cough    Clinically improving  Reassuring exam  Disc symptomatic care - see instructions on AVS  Update if not starting to improve in a week or if worsening        Relevant Medications   acyclovir (ZOVIRAX) 200 MG capsule     Endocrine   Hypothyroidism    On cytomel and synthroid from endocrinology 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  Lab Results  Component Value Date   TSH 13.62* 03/29/2016   Copy to her endocrinologist to see if medicines need adjustment      Relevant Medications   propranolol ER (INDERAL LA) 120 MG 24 hr capsule     Hematopoietic and Hemostatic   Thrombocytosis ([facility])    Has been mild baseline  May have inc due to uri or thyroid antibodies  Continue to monitor         Other   Hypertriglyceridemia    On tricor 145 Disc goals for lipids and reasons to control them Rev labs with pt Rev low sat fat diet in detail       Relevant Medications   propranolol ER (INDERAL LA) 120 MG 24 hr capsule   irbesartan-hydrochlorothiazide (AVALIDE) 150-12.5 MG tablet   fenofibrate (TRICOR) 145 MG tablet   Medicare annual wellness visit, initial    Reviewed health habits including diet and exercise and skin cancer prevention Reviewed appropriate screening tests for age  Also reviewed  health mt list, fam hx and immunization status , as well as social and family history   See HPI Labs reviewed Stop the premarin and let's see how you do  I will send a copy of your TSH to your endocrinologist Take care of yourself  Don't forget to schedule your mammogram       Post-menopausal    She is ready to try stopping hrt Will hold premarin-may take qod for 1-2 wk first      Routine general medical examination at a health care facility    Reviewed health habits including diet and exercise and skin cancer prevention Reviewed appropriate screening tests for age  Also reviewed health mt list, fam hx and immunization status , as well as social and family history   See HPI Labs reviewed Stop the premarin and let's see how you do  I will send a copy of your TSH to your endocrinologist Take care of yourself  Don't forget to schedule your mammogram

## 2016-04-03 NOTE — Progress Notes (Signed)
Pre visit review using our clinic review tool, if applicable. No additional management support is needed unless otherwise documented below in the visit note. 

## 2016-04-03 NOTE — Patient Instructions (Signed)
Stop the premarin and let's see how you do  I will send a copy of your TSH to your endocrinologist Take care of yourself  Don't forget to schedule your mammogram

## 2016-04-04 DIAGNOSIS — Z0000 Encounter for general adult medical examination without abnormal findings: Secondary | ICD-10-CM | POA: Insufficient documentation

## 2016-04-04 NOTE — Assessment & Plan Note (Signed)
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

## 2016-04-04 NOTE — Assessment & Plan Note (Signed)
She is ready to try stopping hrt Will hold premarin-may take qod for 1-2 wk first

## 2016-04-04 NOTE — Assessment & Plan Note (Signed)
Clinically improving  Reassuring exam  Disc symptomatic care - see instructions on AVS  Update if not starting to improve in a week or if worsening

## 2016-04-04 NOTE — Assessment & Plan Note (Signed)
Reviewed health habits including diet and exercise and skin cancer prevention Reviewed appropriate screening tests for age  Also reviewed health mt list, fam hx and immunization status , as well as social and family history   See HPI Labs reviewed Stop the premarin and let's see how you do  I will send a copy of your TSH to your endocrinologist Take care of yourself  Don't forget to schedule your mammogram

## 2016-04-04 NOTE — Assessment & Plan Note (Signed)
bp in fair control at this time  BP Readings from Last 1 Encounters:  04/03/16 132/80   No changes needed Disc lifstyle change with low sodium diet and exercise  Labs reviewed

## 2016-04-04 NOTE — Assessment & Plan Note (Signed)
Has been mild baseline  May have inc due to uri or thyroid antibodies  Continue to monitor

## 2016-04-04 NOTE — Assessment & Plan Note (Signed)
On tricor 145 Disc goals for lipids and reasons to control them Rev labs with pt Rev low sat fat diet in detail

## 2016-04-25 DIAGNOSIS — E890 Postprocedural hypothyroidism: Secondary | ICD-10-CM | POA: Diagnosis not present

## 2016-04-25 DIAGNOSIS — I10 Essential (primary) hypertension: Secondary | ICD-10-CM | POA: Diagnosis not present

## 2016-04-25 DIAGNOSIS — J310 Chronic rhinitis: Secondary | ICD-10-CM | POA: Diagnosis not present

## 2016-04-29 ENCOUNTER — Other Ambulatory Visit: Payer: Self-pay

## 2016-04-29 DIAGNOSIS — Z1231 Encounter for screening mammogram for malignant neoplasm of breast: Secondary | ICD-10-CM

## 2016-05-10 ENCOUNTER — Ambulatory Visit
Admission: RE | Admit: 2016-05-10 | Discharge: 2016-05-10 | Disposition: A | Discharge status: Home / Self Care | Payer: Medicare Other | Source: Ambulatory Visit

## 2016-05-10 DIAGNOSIS — Z1231 Encounter for screening mammogram for malignant neoplasm of breast: Secondary | ICD-10-CM | POA: Diagnosis not present

## 2016-05-10 LAB — HM MAMMOGRAPHY

## 2016-05-13 ENCOUNTER — Encounter: Payer: Self-pay | Admitting: *Deleted

## 2016-09-02 ENCOUNTER — Encounter: Payer: Self-pay | Admitting: Family Medicine

## 2016-09-02 ENCOUNTER — Ambulatory Visit (INDEPENDENT_AMBULATORY_CARE_PROVIDER_SITE_OTHER): Payer: Medicare Other | Admitting: Family Medicine

## 2016-09-02 VITALS — BP 136/86 | HR 113 | Temp 98.3°F | Ht 65.5 in | Wt 186.5 lb

## 2016-09-02 DIAGNOSIS — R300 Dysuria: Secondary | ICD-10-CM

## 2016-09-02 DIAGNOSIS — N3000 Acute cystitis without hematuria: Secondary | ICD-10-CM | POA: Insufficient documentation

## 2016-09-02 DIAGNOSIS — Z23 Encounter for immunization: Secondary | ICD-10-CM

## 2016-09-02 LAB — POC URINALSYSI DIPSTICK (AUTOMATED)
Glucose, UA: NEGATIVE
KETONES UA: NEGATIVE
Nitrite, UA: NEGATIVE
PH UA: 6
Protein, UA: 15
SPEC GRAV UA: 1.025
UROBILINOGEN UA: 0.2

## 2016-09-02 MED ORDER — CIPROFLOXACIN HCL 250 MG PO TABS: 250.0000 mg | ORAL_TABLET | Freq: Two times a day (BID) | ORAL | 0 refills | Status: DC

## 2016-09-02 NOTE — Patient Instructions (Signed)
Drink lots of water  Take cipro with food as directed  We will contact you regarding urine culture  Update if not starting to improve in a week or if worsening

## 2016-09-02 NOTE — Progress Notes (Signed)
Pre visit review using our clinic review tool, if applicable. No additional management support is needed unless otherwise documented below in the visit note. 

## 2016-09-02 NOTE — Assessment & Plan Note (Signed)
Uncomplicated  Cover with cipro low dose 5 d  Enc fluid intake cx pending  Handout on uti given  Update if not starting to improve in a week or if worsening

## 2016-09-02 NOTE — Progress Notes (Signed)
Subjective:    Patient ID: [name], female    DOB: July 10, 1951, 65 y.o.   MRN: [id number]  HPI Here for urinary symptoms - started Thursday night  Tried drinking lots of water   Frequent urination  Pressure over bladder  Discomfort to urinate  Urgency  No blood in urine  No n/v or fever   Results for orders placed or performed in visit on 09/02/16  POCT Urinalysis Dipstick (Automated)  Result Value Ref Range   Color, UA Yellow    Clarity, UA Hazy    Glucose, UA Negative    Bilirubin, UA Trace    Ketones, UA Negative    Spec Grav, UA 1.025    Blood, UA 25 Ery/uL    pH, UA 6.0    Protein, UA 15 mg/dL    Urobilinogen, UA 0.2    Nitrite, UA Negative    Leukocytes, UA small (1+) (A) Negative     Patient Active Problem List   Diagnosis Date Noted  . Acute cystitis 09/02/2016  . Medicare annual wellness visit, initial 04/04/2016  . Other screening mammogram 12/31/2011  . Post-menopausal 12/31/2011  . Routine general medical examination at a health care facility 12/23/2011  . 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  . Hypothyroidism 11/08/2008  . FEVER BLISTER 09/23/2007  . Hyperthyroidism 09/23/2007  . Hypertriglyceridemia 09/23/2007  . Essential hypertension 09/23/2007  . ALLERGIC RHINITIS 09/23/2007  . ASTHMA 09/23/2007  . HYPOKALEMIA 04/16/2007  . ANEMIA NOS 04/16/2007  . LEUKOCYTOSIS NOS 04/09/2007  . Thrombocytosis ([place]) 04/09/2007   Past Medical History:  Diagnosis Date  . Allergic rhinitis   . Anemia, unspecified    b12 def.  . Asthma    controlled w/ singulair  . Herpes simplex without mention of complication    controlled w/ meds (no current fever blisters)  . History of kidney stones   . HLD (hyperlipidemia)    borderline-  no meds  . HTN (hypertension)    echo- 2006  . Hyperthyroidism hx ptu 2006   takes synthroid  . Leukocytosis, unspecified hx of 10 yrs ago   no problem since  . Migraine   . Mixed incontinence    urge and  stress incontinence  . Renal cyst right   . Ureteral calculi right    s/p laser  litho w/ stone extraction 01-28-11   Past Surgical History:  Procedure Laterality Date  . BREAST REDUCTION SURGERY  1994  . CERVICAL FUSION  2000   fusion c4-6  . CERVICAL FUSION  2005   fusion c6-7 and plate removal 624THL  . CYSTOSCOPY W/ RETROGRADES  10/21/2011   Procedure: CYSTOSCOPY WITH RETROGRADE PYELOGRAM;  Surgeon: Ailene Rud, MD;  Location: [facility];  Service: Urology;  Laterality: Right;  CYSTOSCOPY RIGHT RETROGRADE, PYELOGRAM  URETEROSCOPY WITH HOLMIUM LASER AND STONE EXTRACTION  . CYSTOSCOPY/RETROGRADE/URETEROSCOPY/STONE EXTRACTION WITH BASKET  01-28-11   right   . TONSILLECTOMY  1960  . VAGINAL HYSTERECTOMY  1987   fibroids/anemia   Social History  Substance Use Topics  . Smoking status: Never Smoker  . Smokeless tobacco: Never Used  . Alcohol use No   Family History  Problem Relation Age of Onset  . Heart failure Father   . Osteoporosis Mother   . Diabetes Mother   . Diabetes      Grandfather  . Coronary artery disease      Grandmother   Allergies  Allergen Reactions  . Contrast Media [Iodinated Diagnostic Agents]  Anaphylaxis  . Shellfish-Derived Products Anaphylaxis   Current Outpatient Prescriptions on File Prior to Visit  Medication Sig Dispense Refill  . acyclovir (ZOVIRAX) 200 MG capsule TAKE 2 CAPSULES DAILY 180 capsule 3  . calcium-vitamin D (OSCAL WITH D) 500-200 MG-UNIT per tablet Take 2 tablets by mouth daily.     . fenofibrate (TRICOR) 145 MG tablet Take 1 tablet (145 mg total) by mouth daily. 90 tablet 3  . irbesartan-hydrochlorothiazide (AVALIDE) 150-12.5 MG tablet Take 1 tablet by mouth every morning. 90 tablet 3  . levothyroxine (SYNTHROID, LEVOTHROID) 125 MCG tablet Take 125 mcg by mouth every morning.     Marland Kitchen liothyronine (CYTOMEL) 25 MCG tablet Take 25 mcg by mouth every evening.     . mirabegron ER (MYRBETRIQ) 50 MG TB24 tablet  Take 1 tablet (50 mg total) by mouth daily. 90 tablet 3  . montelukast (SINGULAIR) 10 MG tablet Take 1 tablet (10 mg total) by mouth daily. 90 tablet 3  . Multiple Vitamin (MULTIVITAMIN) tablet Take 1 tablet by mouth daily.     . Potassium Citrate 15 MEQ (1620 MG) TBCR Take 1 tablet by mouth 3 (three) times daily. 270 tablet 3  . propranolol ER (INDERAL LA) 120 MG 24 hr capsule Take 1 capsule (120 mg total) by mouth daily. 90 capsule 3  . SUMAtriptan (IMITREX) 100 MG tablet TAKE 1 TABLET FOR HEADACHE MAY REPEAT ONCE IN 2 HOURS IF NEEDED. MAXIMUM OF 2    TABLETS IN ONE DAY. 27 tablet 3  . tolterodine (DETROL LA) 4 MG 24 hr capsule Take 1 capsule (4 mg total) by mouth daily. 90 capsule 3  . URELLE (URELLE/URISED) 81 MG TABS Take 1 tablet (81 mg total) by mouth 3 (three) times daily. 30 each 2  . vitamin B-12 (CYANOCOBALAMIN) 500 MCG tablet Take 500 mcg by mouth daily.       No current facility-administered medications on file prior to visit.     Review of Systems  Constitutional: Positive for fatigue. Negative for activity change, appetite change and fever.  HENT: Negative for congestion and sore throat.   Eyes: Negative for itching and visual disturbance.  Respiratory: Negative for cough and shortness of breath.   Cardiovascular: Negative for leg swelling.  Gastrointestinal: Negative for abdominal distention, abdominal pain, constipation, diarrhea and nausea.  Endocrine: Negative for cold intolerance and polydipsia.  Genitourinary: Positive for dysuria, frequency and urgency. Negative for difficulty urinating, flank pain and hematuria.  Musculoskeletal: Negative for myalgias.  Skin: Negative for rash.  Allergic/Immunologic: Negative for immunocompromised state.  Neurological: Negative for dizziness and weakness.  Hematological: Negative for adenopathy.       Objective:   Physical Exam  Constitutional: She appears well-developed and well-nourished. No distress.  HENT:  Head:  Normocephalic and atraumatic.  Eyes: Conjunctivae and EOM are normal. Pupils are equal, round, and reactive to light.  Neck: Normal range of motion. Neck supple.  Cardiovascular: Normal rate, regular rhythm and normal heart sounds.   Pulmonary/Chest: Effort normal and breath sounds normal.  Abdominal: Soft. Bowel sounds are normal. She exhibits no distension. There is tenderness. There is no rebound.  No cva tenderness  Mild suprapubic tenderness  Musculoskeletal: She exhibits no edema.  Lymphadenopathy:    She has no cervical adenopathy.  Neurological: She is alert.  Skin: No rash noted.  Psychiatric: She has a normal mood and affect.          Assessment & Plan:   Problem List Items Addressed This Visit  Genitourinary   Acute cystitis - Primary    Uncomplicated  Cover with cipro low dose 5 d  Enc fluid intake cx pending  Handout on uti given  Update if not starting to improve in a week or if worsening        Relevant Orders   Urine culture    Other Visit Diagnoses    Dysuria       Relevant Orders   POCT Urinalysis Dipstick (Automated) (Completed)   Need for influenza vaccination       Relevant Orders   Flu Vaccine QUAD 36+ mos IM (Completed)

## 2016-09-03 ENCOUNTER — Ambulatory Visit: Payer: Medicare Other | Admitting: Family Medicine

## 2016-09-04 LAB — URINE CULTURE: ORGANISM ID, BACTERIA: NO GROWTH

## 2016-10-17 DIAGNOSIS — E890 Postprocedural hypothyroidism: Secondary | ICD-10-CM | POA: Diagnosis not present

## 2016-10-17 DIAGNOSIS — I10 Essential (primary) hypertension: Secondary | ICD-10-CM | POA: Diagnosis not present

## 2016-10-17 DIAGNOSIS — J310 Chronic rhinitis: Secondary | ICD-10-CM | POA: Diagnosis not present

## 2016-10-24 DIAGNOSIS — I10 Essential (primary) hypertension: Secondary | ICD-10-CM | POA: Diagnosis not present

## 2016-10-24 DIAGNOSIS — E890 Postprocedural hypothyroidism: Secondary | ICD-10-CM | POA: Diagnosis not present

## 2016-10-24 DIAGNOSIS — J310 Chronic rhinitis: Secondary | ICD-10-CM | POA: Diagnosis not present

## 2017-03-30 ENCOUNTER — Telehealth: Payer: Self-pay | Admitting: Family Medicine

## 2017-03-30 DIAGNOSIS — R7303 Prediabetes: Secondary | ICD-10-CM | POA: Insufficient documentation

## 2017-03-30 DIAGNOSIS — R7309 Other abnormal glucose: Secondary | ICD-10-CM

## 2017-03-30 DIAGNOSIS — Z0000 Encounter for general adult medical examination without abnormal findings: Secondary | ICD-10-CM

## 2017-03-30 NOTE — Telephone Encounter (Signed)
-----   Message from Eustace Pen, LPN sent at 02/19/9701  4:05 PM EDT ----- Regarding: Labs 4/27 Please place lab orders.  [employer] Medicare  Thank you .

## 2017-04-04 ENCOUNTER — Ambulatory Visit (INDEPENDENT_AMBULATORY_CARE_PROVIDER_SITE_OTHER): Payer: Medicare Other

## 2017-04-04 VITALS — BP 124/82 | HR 75 | Temp 98.1°F | Ht 66.0 in | Wt 177.5 lb

## 2017-04-04 DIAGNOSIS — E038 Other specified hypothyroidism: Secondary | ICD-10-CM | POA: Diagnosis not present

## 2017-04-04 DIAGNOSIS — R7309 Other abnormal glucose: Secondary | ICD-10-CM | POA: Diagnosis not present

## 2017-04-04 DIAGNOSIS — Z0000 Encounter for general adult medical examination without abnormal findings: Secondary | ICD-10-CM

## 2017-04-04 LAB — COMPREHENSIVE METABOLIC PANEL
ALK PHOS: 59 U/L (ref 39–117)
ALT: 26 U/L (ref 0–35)
AST: 22 U/L (ref 0–37)
Albumin: 4.7 g/dL (ref 3.5–5.2)
BILIRUBIN TOTAL: 0.5 mg/dL (ref 0.2–1.2)
BUN: 37 mg/dL — AB (ref 6–23)
CO2: 26 mEq/L (ref 19–32)
Calcium: 10 mg/dL (ref 8.4–10.5)
Chloride: 102 mEq/L (ref 96–112)
Creatinine, Ser: 1.16 mg/dL (ref 0.40–1.20)
GFR: 49.62 mL/min — AB (ref >60.00)
GLUCOSE: 114 mg/dL — AB (ref 70–99)
Potassium: 4.2 mEq/L (ref 3.5–5.1)
SODIUM: 137 meq/L (ref 135–145)
TOTAL PROTEIN: 7.8 g/dL (ref 6.0–8.3)

## 2017-04-04 LAB — CBC WITH DIFFERENTIAL/PLATELET
Basophils Absolute: 0.1 10*3/uL (ref 0.0–0.1)
Basophils Relative: 0.6 % (ref 0.0–3.0)
EOS ABS: 0.1 10*3/uL (ref 0.0–0.7)
EOS PCT: 1.1 % (ref 0.0–5.0)
HEMATOCRIT: 42.9 % (ref 36.0–46.0)
HEMOGLOBIN: 14.2 g/dL (ref 12.0–15.0)
LYMPHS PCT: 22.1 % (ref 12.0–46.0)
Lymphs Abs: 2.2 10*3/uL (ref 0.7–4.0)
MCHC: 33.2 g/dL (ref 30.0–36.0)
MCV: 87.3 fl (ref 78.0–100.0)
MONO ABS: 0.5 10*3/uL (ref 0.1–1.0)
Monocytes Relative: 4.6 % (ref 3.0–12.0)
Neutro Abs: 7.1 10*3/uL (ref 1.4–7.7)
Neutrophils Relative %: 71.6 % (ref 43.0–77.0)
Platelets: 558 10*3/uL — ABNORMAL HIGH (ref 150.0–400.0)
RBC: 4.91 Mil/uL (ref 3.87–5.11)
RDW: 12.8 % (ref 11.5–15.5)
WBC: 9.9 10*3/uL (ref 4.0–10.5)

## 2017-04-04 LAB — HEMOGLOBIN A1C: Hgb A1c MFr Bld: 5.6 % (ref 4.6–6.5)

## 2017-04-04 LAB — T3, FREE: T3 FREE: 3.3 pg/mL (ref 2.3–4.2)

## 2017-04-04 LAB — LIPID PANEL
CHOL/HDL RATIO: 2
Cholesterol: 198 mg/dL (ref 0–200)
HDL: 79.8 mg/dL (ref >39.00)
LDL Cholesterol: 78 mg/dL (ref 0–99)
NONHDL: 117.86
Triglycerides: 200 mg/dL — ABNORMAL HIGH (ref 0.0–149.0)
VLDL: 40 mg/dL (ref 0.0–40.0)

## 2017-04-04 LAB — TSH: TSH: 2.35 u[IU]/mL (ref 0.35–4.50)

## 2017-04-04 LAB — T4, FREE: FREE T4: 0.73 ng/dL (ref 0.60–1.60)

## 2017-04-04 NOTE — Progress Notes (Signed)
Pre visit review using our clinic review tool, if applicable. No additional management support is needed unless otherwise documented below in the visit note. 

## 2017-04-04 NOTE — Progress Notes (Signed)
Subjective:   [name] is a 66 y.o. female who presents for Medicare Annual (Subsequent) preventive examination.  Review of Systems:  N/A Cardiac Risk Factors include: advanced age (>11men, >4 women);dyslipidemia;hypertension     Objective:     Vitals: BP 124/82 (BP Location: Right Arm, Patient Position: Sitting, Cuff Size: Normal)   Pulse 75   Temp 98.1 F (36.7 C) (Oral)   Ht 5\' 6"  (1.676 m) Comment: no shoes  Wt 177 lb 8 oz (80.5 kg)   SpO2 97%   BMI 28.65 kg/m   Body mass index is 28.65 kg/m.   Tobacco History  Smoking Status  . Never Smoker  Smokeless Tobacco  . Never Used     Counseling given: No   Past Medical History:  Diagnosis Date  . Allergic rhinitis   . Anemia, unspecified    b12 def.  . Asthma    controlled w/ singulair  . Herpes simplex without mention of complication    controlled w/ meds (no current fever blisters)  . History of kidney stones   . HLD (hyperlipidemia)    borderline-  no meds  . HTN (hypertension)    echo- 2006  . Hyperthyroidism hx ptu 2006   takes synthroid  . Leukocytosis, unspecified hx of 10 yrs ago   no problem since  . Migraine   . Mixed incontinence    urge and stress incontinence  . Renal cyst right   . Ureteral calculi right    s/p laser  litho w/ stone extraction 01-28-11   Past Surgical History:  Procedure Laterality Date  . BREAST REDUCTION SURGERY  1994  . CERVICAL FUSION  2000   fusion c4-6  . CERVICAL FUSION  2005   fusion c6-7 and plate removal T6-2  . CYSTOSCOPY W/ RETROGRADES  10/21/2011   Procedure: CYSTOSCOPY WITH RETROGRADE PYELOGRAM;  Surgeon: Ailene Rud, MD;  Location: [facility];  Service: Urology;  Laterality: Right;  CYSTOSCOPY RIGHT RETROGRADE, PYELOGRAM  URETEROSCOPY WITH HOLMIUM LASER AND STONE EXTRACTION  . CYSTOSCOPY/RETROGRADE/URETEROSCOPY/STONE EXTRACTION WITH BASKET  01-28-11   right   . TONSILLECTOMY  1960  . VAGINAL HYSTERECTOMY  1987   fibroids/anemia   Family History  Problem Relation Age of Onset  . Heart failure Father   . Osteoporosis Mother   . Diabetes Mother   . Diabetes      Grandfather  . Coronary artery disease      Grandmother   History  Sexual Activity  . Sexual activity: Not on file    Outpatient Encounter Prescriptions as of 04/04/2017  Medication Sig  . acyclovir (ZOVIRAX) 200 MG capsule TAKE 2 CAPSULES DAILY  . calcium-vitamin D (OSCAL WITH D) 500-200 MG-UNIT per tablet Take 2 tablets by mouth daily.   . fenofibrate (TRICOR) 145 MG tablet Take 1 tablet (145 mg total) by mouth daily.  . irbesartan-hydrochlorothiazide (AVALIDE) 150-12.5 MG tablet Take 1 tablet by mouth every morning.  Marland Kitchen levothyroxine (SYNTHROID, LEVOTHROID) 125 MCG tablet Take 125 mcg by mouth every morning.   Marland Kitchen liothyronine (CYTOMEL) 25 MCG tablet Take 25 mcg by mouth every evening.   . mirabegron ER (MYRBETRIQ) 50 MG TB24 tablet Take 1 tablet (50 mg total) by mouth daily.  . montelukast (SINGULAIR) 10 MG tablet Take 1 tablet (10 mg total) by mouth daily.  . Multiple Vitamin (MULTIVITAMIN) tablet Take 1 tablet by mouth daily.   . Potassium Citrate 15 MEQ (1620 MG) TBCR Take 1 tablet by mouth 3 (  three) times daily.  . propranolol ER (INDERAL LA) 120 MG 24 hr capsule Take 1 capsule (120 mg total) by mouth daily.  . SUMAtriptan (IMITREX) 100 MG tablet TAKE 1 TABLET FOR HEADACHE MAY REPEAT ONCE IN 2 HOURS IF NEEDED. MAXIMUM OF 2    TABLETS IN ONE DAY.  . vitamin B-12 (CYANOCOBALAMIN) 500 MCG tablet Take 500 mcg by mouth daily.    . [DISCONTINUED] tolterodine (DETROL LA) 4 MG 24 hr capsule Take 1 capsule (4 mg total) by mouth daily.  . [DISCONTINUED] ciprofloxacin (CIPRO) 250 MG tablet Take 1 tablet (250 mg total) by mouth 2 (two) times daily.  . [DISCONTINUED] URELLE (URELLE/URISED) 81 MG TABS Take 1 tablet (81 mg total) by mouth 3 (three) times daily.   No facility-administered encounter medications on file as of 04/04/2017.      Activities of Daily Living In your present state of health, do you have any difficulty performing the following activities: 04/04/2017  Hearing? N  Vision? N  Difficulty concentrating or making decisions? N  Walking or climbing stairs? N  Dressing or bathing? N  Doing errands, shopping? N  Preparing Food and eating ? N  Using the Toilet? N  In the past six months, have you accidently leaked urine? Y  Do you have problems with loss of bowel control? N  Managing your Medications? N  Managing your Finances? N  Housekeeping or managing your Housekeeping? N  Some recent data might be hidden    Patient Care Team: Abner Greenspan, MD as PCP - General    Assessment:     Hearing Screening   125Hz  250Hz  500Hz  1000Hz  2000Hz  3000Hz  4000Hz  6000Hz  8000Hz   Right ear:   40 40 40  40    Left ear:   40 40 40  40      Visual Acuity Screening   Right eye Left eye Both eyes  Without correction: 20/50-1 20/40 20/20-1  With correction:       Exercise Activities and Dietary recommendations Current Exercise Habits: Home exercise routine, Type of exercise: walking;Other - see comments (gardening), Time (Minutes): 45, Frequency (Times/Week): 3, Weekly Exercise (Minutes/Week): 135, Intensity: Moderate, Exercise limited by: None identified  Goals    . Increase physical activity          Starting 04/04/2017, I will continue to exercise for at least 45 min 3 days per week.       Fall Risk Fall Risk  04/04/2017 04/03/2016  Falls in the past year? No No   Depression Screen PHQ 2/9 Scores 04/04/2017 04/03/2016 03/02/2013  PHQ - 2 Score 0 0 0     Cognitive Function MMSE - Mini Mental State Exam 04/04/2017  Orientation to time 5  Orientation to Place 5  Registration 3  Attention/ Calculation 0  Recall 3  Language- name 2 objects 0  Language- repeat 1  Language- follow 3 step command 3  Language- read & follow direction 0  Write a sentence 0  Copy design 0  Total score 20       PLEASE  NOTE: A Mini-Cog screen was completed. Maximum score is 20. A value of 0 denotes this part of Folstein MMSE was not completed or the patient failed this part of the Mini-Cog screening.   Mini-Cog Screening Orientation to Time - Max 5 pts Orientation to Place - Max 5 pts Registration - Max 3 pts Recall - Max 3 pts Language Repeat - Max 1 pts Language Follow 3 Step Command -  Max 3 pts   Immunization History  Administered Date(s) Administered  . Influenza Split 08/10/2011  . Influenza Whole 10/10/2007, 08/17/2008, 08/31/2009, 08/09/2010  . Influenza,inj,Quad PF,36+ Mos 08/04/2015, 09/02/2016  . Influenza-Unspecified 08/09/2014  . Pneumococcal Conjugate-13 11/28/2015  . Pneumococcal Polysaccharide-23 08/25/2003, 10/10/2007  . Pneumococcal-Unspecified 11/27/2016  . Td 10/21/2006  . Tdap 03/20/2015  . Zoster 03/25/2012   Screening Tests Health Maintenance  Topic Date Due  . COLONOSCOPY  04/07/2018 (Originally 01/09/2017)  . MAMMOGRAM  05/10/2017  . INFLUENZA VACCINE  07/09/2017  . TETANUS/TDAP  03/19/2025  . DEXA SCAN  Completed  . Hepatitis C Screening  Completed  . PNA vac Low Risk Adult  Completed      Plan:     I have personally reviewed and addressed the Medicare Annual Wellness questionnaire and have noted the following in the patient's chart:  A. Medical and social history B. Use of alcohol, tobacco or illicit drugs  C. Current medications and supplements D. Functional ability and status E.  Nutritional status F.  Physical activity G. Advance directives H. List of other physicians I.  Hospitalizations, surgeries, and ER visits in previous 12 months J.  [place] to include hearing, vision, cognitive, depression L. Referrals and appointments - none  In addition, I have reviewed and discussed with patient certain preventive protocols, quality metrics, and best practice recommendations. A written personalized care plan for preventive services as well as  general preventive health recommendations were provided to patient.  See attached scanned questionnaire for additional information.   Signed,   Lindell Noe, MHA, BS, LPN Health Coach

## 2017-04-04 NOTE — Progress Notes (Signed)
PCP notes:   Health maintenance:  PPSV23 - per pt, administered at [facility] in Dec 2017  Colonoscopy - addressed; pt will schedule future appt  Abnormal screenings:   None  Patient concerns:   Pt reported she is no longer under care of endocrinologist and would like a full thyroid panel. PCP notified. Free T3 and Free T4 were added to lab orders.   Nurse concerns:  None  Next PCP appt:   04/09/17 @ 1030  I reviewed health advisor's note, was available for consultation, and agree with documentation and plan. Loura Pardon MD

## 2017-04-04 NOTE — Patient Instructions (Signed)
Ms. Millar , Thank you for taking time to come for your Medicare Wellness Visit. I appreciate your ongoing commitment to your health goals. Please review the following plan we discussed and let me know if I can assist you in the future.   These are the goals we discussed: Goals    . Increase physical activity          Starting 04/04/2017, I will continue to exercise for at least 45 min 3 days per week.        This is a list of the screening recommended for you and due dates:  Health Maintenance  Topic Date Due  . Colon Cancer Screening  04/07/2018*  . Mammogram  05/10/2017  . Flu Shot  07/09/2017  . Tetanus Vaccine  03/19/2025  . DEXA scan (bone density measurement)  Completed  .  Hepatitis C: One time screening is recommended by Center for Disease Control  (CDC) for  adults born from [birth year] through 1966.   Completed  . Pneumonia vaccines  Completed  *Topic was postponed. The date shown is not the original due date.   Preventive Care for Adults  A healthy lifestyle and preventive care can promote health and wellness. Preventive health guidelines for adults include the following key practices.  . A routine yearly physical is a good way to check with your health care provider about your health and preventive screening. It is a chance to share any concerns and updates on your health and to receive a thorough exam.  . Visit your dentist for a routine exam and preventive care every 6 months. Brush your teeth twice a day and floss once a day. Good oral hygiene prevents tooth decay and gum disease.  . The frequency of eye exams is based on your age, health, family medical history, use  of contact lenses, and other factors. Follow your health care provider's ecommendations for frequency of eye exams.  . Eat a healthy diet. Foods like vegetables, fruits, whole grains, low-fat dairy products, and lean protein foods contain the nutrients you need without too many calories. Decrease your intake  of foods high in solid fats, added sugars, and salt. Eat the right amount of calories for you. Get information about a proper diet from your health care provider, if necessary.  . Regular physical exercise is one of the most important things you can do for your health. Most adults should get at least 150 minutes of moderate-intensity exercise (any activity that increases your heart rate and causes you to sweat) each week. In addition, most adults need muscle-strengthening exercises on 2 or more days a week.  Silver Sneakers may be a benefit available to you. To determine eligibility, you may visit the website: www.silversneakers.com or contact program at [phone] Mon-Fri between 8AM-8PM.   . Maintain a healthy weight. The body mass index (BMI) is a screening tool to identify possible weight problems. It provides an estimate of body fat based on height and weight. Your health care provider can find your BMI and can help you achieve or maintain a healthy weight.   For adults 20 years and older: ? A BMI below 18.5 is considered underweight. ? A BMI of 18.5 to 24.9 is normal. ? A BMI of 25 to 29.9 is considered overweight. ? A BMI of 30 and above is considered obese.   . Maintain normal blood lipids and cholesterol levels by exercising and minimizing your intake of saturated fat. Eat a balanced diet with plenty of  fruit and vegetables. Blood tests for lipids and cholesterol should begin at age 27 and be repeated every 5 years. If your lipid or cholesterol levels are high, you are over 50, or you are at high risk for heart disease, you may need your cholesterol levels checked more frequently. Ongoing high lipid and cholesterol levels should be treated with medicines if diet and exercise are not working.  . If you smoke, find out from your health care provider how to quit. If you do not use tobacco, please do not start.  . If you choose to drink alcohol, please do not consume more than 2 drinks  per day. One drink is considered to be 12 ounces (355 mL) of beer, 5 ounces (148 mL) of wine, or 1.5 ounces (44 mL) of liquor.  . If you are 12-32 years old, ask your health care provider if you should take aspirin to prevent strokes.  . Use sunscreen. Apply sunscreen liberally and repeatedly throughout the day. You should seek shade when your shadow is shorter than you. Protect yourself by wearing long sleeves, pants, a wide-brimmed hat, and sunglasses year round, whenever you are outdoors.  . Once a month, do a whole body skin exam, using a mirror to look at the skin on your back. Tell your health care provider of new moles, moles that have irregular borders, moles that are larger than a pencil eraser, or moles that have changed in shape or color.

## 2017-04-09 ENCOUNTER — Encounter: Payer: Self-pay | Admitting: Family Medicine

## 2017-04-09 ENCOUNTER — Other Ambulatory Visit: Payer: Self-pay | Admitting: Family Medicine

## 2017-04-09 ENCOUNTER — Ambulatory Visit (INDEPENDENT_AMBULATORY_CARE_PROVIDER_SITE_OTHER): Payer: Medicare Other | Admitting: Family Medicine

## 2017-04-09 VITALS — BP 128/76 | HR 65 | Temp 97.8°F | Ht 66.0 in | Wt 178.0 lb

## 2017-04-09 DIAGNOSIS — E039 Hypothyroidism, unspecified: Secondary | ICD-10-CM | POA: Diagnosis not present

## 2017-04-09 DIAGNOSIS — E781 Pure hyperglyceridemia: Secondary | ICD-10-CM | POA: Diagnosis not present

## 2017-04-09 DIAGNOSIS — I10 Essential (primary) hypertension: Secondary | ICD-10-CM | POA: Diagnosis not present

## 2017-04-09 DIAGNOSIS — D75839 Thrombocytosis, unspecified: Secondary | ICD-10-CM

## 2017-04-09 DIAGNOSIS — Z1211 Encounter for screening for malignant neoplasm of colon: Secondary | ICD-10-CM

## 2017-04-09 DIAGNOSIS — Z1231 Encounter for screening mammogram for malignant neoplasm of breast: Secondary | ICD-10-CM

## 2017-04-09 DIAGNOSIS — Z0000 Encounter for general adult medical examination without abnormal findings: Secondary | ICD-10-CM

## 2017-04-09 DIAGNOSIS — D473 Essential (hemorrhagic) thrombocythemia: Secondary | ICD-10-CM | POA: Diagnosis not present

## 2017-04-09 DIAGNOSIS — R7309 Other abnormal glucose: Secondary | ICD-10-CM

## 2017-04-09 MED ORDER — MONTELUKAST SODIUM 10 MG PO TABS: 10.0000 mg | ORAL_TABLET | Freq: Every day | ORAL | 3 refills | Status: DC

## 2017-04-09 MED ORDER — MIRABEGRON ER 50 MG PO TB24: 50.0000 mg | ORAL_TABLET | Freq: Every day | ORAL | 3 refills | Status: DC

## 2017-04-09 MED ORDER — PROPRANOLOL HCL ER 120 MG PO CP24: 120.0000 mg | ORAL_CAPSULE | Freq: Every day | ORAL | 3 refills | Status: DC

## 2017-04-09 MED ORDER — IRBESARTAN-HYDROCHLOROTHIAZIDE 150-12.5 MG PO TABS: 1.0000 | ORAL_TABLET | ORAL | 3 refills | Status: DC

## 2017-04-09 MED ORDER — FENOFIBRATE 160 MG PO TABS: 160.0000 mg | ORAL_TABLET | Freq: Every day | ORAL | 3 refills | Status: DC

## 2017-04-09 MED ORDER — LIOTHYRONINE SODIUM 25 MCG PO TABS: 12.5000 ug | ORAL_TABLET | Freq: Every evening | ORAL | 3 refills | Status: DC

## 2017-04-09 MED ORDER — SUMATRIPTAN SUCCINATE 100 MG PO TABS: ORAL_TABLET | ORAL | 3 refills | Status: DC

## 2017-04-09 MED ORDER — LEVOTHYROXINE SODIUM 125 MCG PO TABS: 125.0000 ug | ORAL_TABLET | ORAL | 3 refills | Status: DC

## 2017-04-09 MED ORDER — ACYCLOVIR 200 MG PO CAPS
ORAL_CAPSULE | ORAL | 3 refills | Status: DC
Start: 2017-04-09 — End: 2018-04-15

## 2017-04-09 MED ORDER — POTASSIUM CITRATE ER 15 MEQ (1620 MG) PO TBCR: 1.0000 | EXTENDED_RELEASE_TABLET | Freq: Three times a day (TID) | ORAL | 3 refills | Status: DC

## 2017-04-09 NOTE — Progress Notes (Signed)
Subjective:    Patient ID: [name], female    DOB: Mar 19, 1951, 66 y.o.   MRN: [id number]  HPI Here for health maintenance exam and to review chronic medical problems    Feeling fine  Taking care of herself Lots of exercise -just spread 500 bags of mulch- yard work and gardening  Outdoors all the time since she retired -very happy  Seen for amw 4/27-no concerns   Colon cancer screening 2/08 colonoscopy normal  Is ok with scheduling that    Mammogram 6/17-nl-has her appt scheduled  Self breast exam - no lumps   dexa 1/13- BMD is in the normal range  Does not want to check another one yet  No falls or fractures  Takes her calcium and D   Gyn -hysterectomy for fibroids No gyn symptoms    zostavax 4/13 Interested in shingrix if affordable    Wt Readings from Last 3 Encounters:  04/09/17 178 lb (80.7 kg)  04/04/17 177 lb 8 oz (80.5 kg)  09/02/16 186 lb 8 oz (84.6 kg)  eating healthy  Outdoors and exercising  bmi 28.7  Hx of hyperthyroidism treated-now hypothyroid Lab Results  Component Value Date   TSH 2.35 04/04/2017   This is down from 13.6 T3 is 3.3 Free T4 is 0.73  Takes cytomel 25 mcg (1/2 pill daily)  Levothyroxine 125 mcg  She was released by Dr Jerilynn Mages after last [place] and just need to follow labs  No symptoms at all   bp is stable today  No cp or palpitations or headaches or edema  No side effects to medicines  BP Readings from Last 3 Encounters:  04/09/17 128/76  04/04/17 124/82  09/02/16 136/86      Hx of thrombocytosis Lab Results  Component Value Date   WBC 9.9 04/04/2017   HGB 14.2 04/04/2017   HCT 42.9 04/04/2017   MCV 87.3 04/04/2017   PLT 558.0 (H) 04/04/2017   no bleeding or bruising problems     Hx of high triglycerides  Lab Results  Component Value Date   CHOL 198 04/04/2017   CHOL 209 (H) 03/29/2016   CHOL 188 03/20/2015   Lab Results  Component Value Date   HDL 79.80 04/04/2017   HDL 72.20 03/29/2016   HDL  63.90 03/20/2015   Lab Results  Component Value Date   LDLCALC 78 04/04/2017   LDLCALC 88 03/20/2015   LDLCALC 108 (H) 03/04/2014   Lab Results  Component Value Date   TRIG 200.0 (H) 04/04/2017   TRIG 207.0 (H) 03/29/2016   TRIG 179.0 (H) 03/20/2015   Lab Results  Component Value Date   CHOLHDL 2 04/04/2017   CHOLHDL 3 03/29/2016   CHOLHDL 3 03/20/2015   Lab Results  Component Value Date   LDLDIRECT 112.0 03/29/2016   LDLDIRECT 123.4 06/21/2013   LDLDIRECT 114.2 04/13/2013  eating better/ exercising  Takes triglyceride agent  Ins pays for lofibra instead of generic fenofibrate   Hx of hyperglycemia Lab Results  Component Value Date   HGBA1C 5.6 04/04/2017  fasting glucose was 114  She is watching carbs and sugar in diet  Also exercise /working outdoors     Chemistry      Component Value Date/Time   NA 137 04/04/2017 0904   K 4.2 04/04/2017 0904   CL 102 04/04/2017 0904   CO2 26 04/04/2017 0904   BUN 37 (H) 04/04/2017 0904   CREATININE 1.16 04/04/2017 0904      Component  Value Date/Time   CALCIUM 10.0 04/04/2017 0904   ALKPHOS 59 04/04/2017 0904   AST 22 04/04/2017 0904   ALT 26 04/04/2017 0904   BILITOT 0.5 04/04/2017 0904      Is making the effort to drink lots of water    Patient Active Problem List   Diagnosis Date Noted  . Colon cancer screening 04/09/2017  . Elevated glucose level 03/30/2017  . Medicare annual wellness visit, initial 04/04/2016  . Other screening mammogram 12/31/2011  . Post-menopausal 12/31/2011  . Routine general medical examination at a health care facility 12/23/2011  . 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  . Hypothyroidism 11/08/2008  . FEVER BLISTER 09/23/2007  . Hypertriglyceridemia 09/23/2007  . Essential hypertension 09/23/2007  . ALLERGIC RHINITIS 09/23/2007  . ASTHMA 09/23/2007  . HYPOKALEMIA 04/16/2007  . LEUKOCYTOSIS NOS 04/09/2007  . Thrombocytosis ([place]) 04/09/2007   Past Medical History:    Diagnosis Date  . Allergic rhinitis   . Anemia, unspecified    b12 def.  . Asthma    controlled w/ singulair  . Herpes simplex without mention of complication    controlled w/ meds (no current fever blisters)  . History of kidney stones   . HLD (hyperlipidemia)    borderline-  no meds  . HTN (hypertension)    echo- 2006  . Hyperthyroidism hx ptu 2006   takes synthroid  . Leukocytosis, unspecified hx of 10 yrs ago   no problem since  . Migraine   . Mixed incontinence    urge and stress incontinence  . Renal cyst right   . Ureteral calculi right    s/p laser  litho w/ stone extraction 01-28-11   Past Surgical History:  Procedure Laterality Date  . BREAST REDUCTION SURGERY  1994  . CERVICAL FUSION  2000   fusion c4-6  . CERVICAL FUSION  2005   fusion c6-7 and plate removal M6-2  . CYSTOSCOPY W/ RETROGRADES  10/21/2011   Procedure: CYSTOSCOPY WITH RETROGRADE PYELOGRAM;  Surgeon: Ailene Rud, MD;  Location: [facility];  Service: Urology;  Laterality: Right;  CYSTOSCOPY RIGHT RETROGRADE, PYELOGRAM  URETEROSCOPY WITH HOLMIUM LASER AND STONE EXTRACTION  . CYSTOSCOPY/RETROGRADE/URETEROSCOPY/STONE EXTRACTION WITH BASKET  01-28-11   right   . TONSILLECTOMY  1960  . VAGINAL HYSTERECTOMY  1987   fibroids/anemia   Social History  Substance Use Topics  . Smoking status: Never Smoker  . Smokeless tobacco: Never Used  . Alcohol use No   Family History  Problem Relation Age of Onset  . Heart failure Father   . Osteoporosis Mother   . Diabetes Mother   . Diabetes      Grandfather  . Coronary artery disease      Grandmother   Allergies  Allergen Reactions  . Contrast Media [Iodinated Diagnostic Agents] Anaphylaxis  . Shellfish-Derived Products Anaphylaxis   Current Outpatient Prescriptions on File Prior to Visit  Medication Sig Dispense Refill  . calcium-vitamin D (OSCAL WITH D) 500-200 MG-UNIT per tablet Take 2 tablets by mouth daily.     .  Multiple Vitamin (MULTIVITAMIN) tablet Take 1 tablet by mouth daily.     . vitamin B-12 (CYANOCOBALAMIN) 500 MCG tablet Take 500 mcg by mouth daily.       No current facility-administered medications on file prior to visit.     Review of Systems Review of Systems  Constitutional: Negative for fever, appetite change, fatigue and unexpected weight change.  Eyes: Negative for pain and visual disturbance.  Respiratory: Negative for cough and shortness of breath.   Cardiovascular: Negative for cp or palpitations    Gastrointestinal: Negative for nausea, diarrhea and constipation.  Genitourinary: Negative for urgency and frequency.  Skin: Negative for pallor or rash   Neurological: Negative for weakness, light-headedness, numbness and headaches.  Hematological: Negative for adenopathy. Does not bruise/bleed easily.  Psychiatric/Behavioral: Negative for dysphoric mood. The patient is not nervous/anxious.         Objective:   Physical Exam  Constitutional: She appears well-developed and well-nourished. No distress.  overwt and well appearing   HENT:  Head: Normocephalic and atraumatic.  Right Ear: External ear normal.  Left Ear: External ear normal.  Mouth/Throat: Oropharynx is clear and moist.  Eyes: Conjunctivae and EOM are normal. Pupils are equal, round, and reactive to light. No scleral icterus.  Neck: Normal range of motion. Neck supple. No JVD present. Carotid bruit is not present. No thyromegaly present.  Cardiovascular: Normal rate, regular rhythm, normal heart sounds and intact distal pulses.  Exam reveals no gallop.   Pulmonary/Chest: Effort normal and breath sounds normal. No respiratory distress. She has no wheezes. She exhibits no tenderness.  Abdominal: Soft. Bowel sounds are normal. She exhibits no distension, no abdominal bruit and no mass. There is no tenderness.  Genitourinary: No breast swelling, tenderness, discharge or bleeding.  Genitourinary Comments: Breast exam:  No mass, nodules, thickening, tenderness, bulging, retraction, inflamation, nipple discharge or skin changes noted.  No axillary or clavicular LA.      Musculoskeletal: Normal range of motion. She exhibits no edema or tenderness.  No kyphosis   Lymphadenopathy:    She has no cervical adenopathy.  Neurological: She is alert. She has normal reflexes. No cranial nerve deficit. She exhibits normal muscle tone. Coordination normal.  Skin: Skin is warm and dry. No rash noted. No erythema. No pallor.  No bruising   Fair complexion occ SK  Psychiatric: She has a normal mood and affect.          Assessment & Plan:   Problem List Items Addressed This Visit      Cardiovascular and Mediastinum   Essential hypertension - Primary    bp in fair control at this time  BP Readings from Last 1 Encounters:  04/09/17 128/76   No changes needed Disc lifstyle change with low sodium diet and exercise  Labs reviewed       Relevant Medications   fenofibrate (LOFIBRA) 160 MG tablet   irbesartan-hydrochlorothiazide (AVALIDE) 150-12.5 MG tablet   propranolol ER (INDERAL LA) 120 MG 24 hr capsule     Endocrine   Hypothyroidism    On both cytomel (T3) and levothyroxine DAW (T4) Past hx of hyperthyroid  Released from care of endocrinology  Doing well  Labs are in nl range No change in doses       Relevant Medications   levothyroxine (SYNTHROID, LEVOTHROID) 125 MCG tablet   liothyronine (CYTOMEL) 25 MCG tablet   propranolol ER (INDERAL LA) 120 MG 24 hr capsule     Hematopoietic and Hemostatic   Thrombocytosis (HCC)    Chronic and asymptomatic  Pl count 558  No hx of thrombosis  Past hx of autoimmune hyperthyroidism Continue to watch -consider path smear eval and ferritin with next draw           Other   Colon cancer screening    Ref for screening colonoscopy/ 10 y recall       Relevant Orders   Ambulatory referral to  Gastroenterology   Elevated glucose level    Lab Results    Component Value Date   HGBA1C 5.6 04/04/2017   disc imp of low glycemic diet and wt loss to prevent DM2       Hypertriglyceridemia    For ins changed to lofibra 160 Disc goals for lipids and reasons to control them Rev labs with pt Rev low sat fat diet in detail Well controlled overall        Relevant Medications   fenofibrate (LOFIBRA) 160 MG tablet   irbesartan-hydrochlorothiazide (AVALIDE) 150-12.5 MG tablet   propranolol ER (INDERAL LA) 120 MG 24 hr capsule   Routine general medical examination at a health care facility    Reviewed health habits including diet and exercise and skin cancer prevention Reviewed appropriate screening tests for age  Also reviewed health mt list, fam hx and immunization status , as well as social and family history   See HPI Labs reviewed AMW reviewed Mammogram for June is scheduled  Pt wants to hold off on next dexa (no falls or fx) Will determine coverage of Shingrix Ref for colonoscopy /screening

## 2017-04-09 NOTE — Assessment & Plan Note (Signed)
bp in fair control at this time  BP Readings from Last 1 Encounters:  04/09/17 128/76   No changes needed Disc lifstyle change with low sodium diet and exercise  Labs reviewed

## 2017-04-09 NOTE — Assessment & Plan Note (Signed)
Ref for screening colonoscopy/ 10 y recall

## 2017-04-09 NOTE — Assessment & Plan Note (Signed)
On both cytomel (T3) and levothyroxine DAW (T4) Past hx of hyperthyroid  Released from care of endocrinology  Doing well  Labs are in nl range No change in doses

## 2017-04-09 NOTE — Patient Instructions (Addendum)
We will refer you for a colonsocopy   You will be due for mammogram next month- make sure to get it in June  If you are interested in the new shingles vaccine (Shingrix) - call your insurance to check on coverage,( you should not get it within 1 month of other vaccines) , then call us for a prescription  for it to take to a pharmacy that gives the shot , or make a nurse visit to get it here depending on your coverage   Goal of 64 oz of fluids per day for kidney health (mostly water)   Take care of yourself

## 2017-04-09 NOTE — Progress Notes (Signed)
Pre visit review using our clinic review tool, if applicable. No additional management support is needed unless otherwise documented below in the visit note. 

## 2017-04-09 NOTE — Assessment & Plan Note (Signed)
Lab Results  Component Value Date   HGBA1C 5.6 04/04/2017   disc imp of low glycemic diet and wt loss to prevent DM2

## 2017-04-09 NOTE — Assessment & Plan Note (Signed)
Reviewed health habits including diet and exercise and skin cancer prevention Reviewed appropriate screening tests for age  Also reviewed health mt list, fam hx and immunization status , as well as social and family history   See HPI Labs reviewed AMW reviewed Mammogram for June is scheduled  Pt wants to hold off on next dexa (no falls or fx) Will determine coverage of Shingrix Ref for colonoscopy /screening

## 2017-04-09 NOTE — Assessment & Plan Note (Signed)
For ins changed to lofibra 160 Disc goals for lipids and reasons to control them Rev labs with pt Rev low sat fat diet in detail Well controlled overall

## 2017-04-09 NOTE — Assessment & Plan Note (Addendum)
Chronic and asymptomatic  Pl count 558  No hx of thrombosis  Past hx of autoimmune hyperthyroidism Continue to watch -consider path smear eval and ferritin with next draw

## 2017-04-14 ENCOUNTER — Encounter: Payer: Self-pay | Admitting: Family Medicine

## 2017-04-14 ENCOUNTER — Ambulatory Visit (INDEPENDENT_AMBULATORY_CARE_PROVIDER_SITE_OTHER): Payer: Medicare Other | Admitting: Family Medicine

## 2017-04-14 VITALS — BP 130/84 | HR 104 | Temp 98.5°F | Ht 65.5 in | Wt 174.0 lb

## 2017-04-14 DIAGNOSIS — R1013 Epigastric pain: Secondary | ICD-10-CM

## 2017-04-14 MED ORDER — OMEPRAZOLE 20 MG PO CPDR: 20.0000 mg | DELAYED_RELEASE_CAPSULE | Freq: Every day | ORAL | 5 refills | Status: DC

## 2017-04-14 NOTE — Assessment & Plan Note (Signed)
Pt is having pronounced belching after eating with abd discomfort and decreased appetite  Relieved with alka selzer No RUQ pain  Suspect acid related (in pt with known HH) Will try omeprazole 20 mg daily  Diet info given  Info on gerd as well  Update-if not imp in 7 days please update  LLQ pain warrants further eval if this continues-? If related   (working on setting up her screening colonoscopy as well)

## 2017-04-14 NOTE — Progress Notes (Signed)
Pre visit review using our clinic review tool, if applicable. No additional management support is needed unless otherwise documented below in the visit note. 

## 2017-04-14 NOTE — Progress Notes (Signed)
Subjective:    Patient ID: [name], female    DOB: [birth year], 66 y.o.   MRN: [id number]  HPI Here for GI symptoms of abd pain and gas and GERD symptoms   Thursday evening after dinner - she had a lot of burping  (pot roast)  Then cramping pain in L abdomen  Distended abdomen  When burping quit-pain quit  Since then -it happens every time she eats  Eating bland  Lasts a few hours   Alka selzer helps the most  She tried zantac 150 and pepcid complete and pepto bismol   Has not tried prilosec or nexium    Has lost 4 lb   Has hx of HH Avoids spicy foods  No heartburn lately   No hx of gallstones   This does not feel like a kidney stone   No diarrhea or constipation  No blood on stool or change in color   Patient Active Problem List   Diagnosis Date Noted  . Dyspepsia 04/14/2017  . Colon cancer screening 04/09/2017  . Elevated glucose level 03/30/2017  . Medicare annual wellness visit, initial 04/04/2016  . Other screening mammogram 12/31/2011  . Post-menopausal 12/31/2011  . Routine general medical examination at a health care facility 12/23/2011  . 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  . Hypothyroidism 11/08/2008  . FEVER BLISTER 09/23/2007  . Hypertriglyceridemia 09/23/2007  . Essential hypertension 09/23/2007  . ALLERGIC RHINITIS 09/23/2007  . ASTHMA 09/23/2007  . HYPOKALEMIA 04/16/2007  . LEUKOCYTOSIS NOS 04/09/2007  . Thrombocytosis ([place]) 04/09/2007   Past Medical History:  Diagnosis Date  . Allergic rhinitis   . Anemia, unspecified    b12 def.  . Asthma    controlled w/ singulair  . Herpes simplex without mention of complication    controlled w/ meds (no current fever blisters)  . History of kidney stones   . HLD (hyperlipidemia)    borderline-  no meds  . HTN (hypertension)    echo- 2006  . Hyperthyroidism hx ptu 2006   takes synthroid  . Leukocytosis, unspecified hx of 10 yrs ago   no problem since  . Migraine   .  Mixed incontinence    urge and stress incontinence  . Renal cyst right   . Ureteral calculi right    s/p laser  litho w/ stone extraction 01-28-11   Past Surgical History:  Procedure Laterality Date  . BREAST REDUCTION SURGERY  1994  . CERVICAL FUSION  2000   fusion c4-6  . CERVICAL FUSION  2005   fusion c6-7 and plate removal L7-9  . CYSTOSCOPY W/ RETROGRADES  10/21/2011   Procedure: CYSTOSCOPY WITH RETROGRADE PYELOGRAM;  Surgeon: Ailene Rud, MD;  [facility];  Service: Urology;  Laterality: Right;  CYSTOSCOPY RIGHT RETROGRADE, PYELOGRAM  URETEROSCOPY WITH HOLMIUM LASER AND STONE EXTRACTION  . CYSTOSCOPY/RETROGRADE/URETEROSCOPY/STONE EXTRACTION WITH BASKET  01-28-11   right   . TONSILLECTOMY  1960  . VAGINAL HYSTERECTOMY  1987   fibroids/anemia   Social History  Substance Use Topics  . Smoking status: Never Smoker  . Smokeless tobacco: Never Used  . Alcohol use No   Family History  Problem Relation Age of Onset  . Heart failure Father   . Osteoporosis Mother   . Diabetes Mother   . Diabetes      Grandfather  . Coronary artery disease      Grandmother   Allergies  Allergen Reactions  . Contrast Media [Iodinated Diagnostic Agents]  Anaphylaxis  . Shellfish-Derived Products Anaphylaxis   Current Outpatient Prescriptions on File Prior to Visit  Medication Sig Dispense Refill  . acyclovir (ZOVIRAX) 200 MG capsule TAKE 2 CAPSULES DAILY 180 capsule 3  . calcium-vitamin D (OSCAL WITH D) 500-200 MG-UNIT per tablet Take 2 tablets by mouth daily.     . fenofibrate (LOFIBRA) 160 MG tablet Take 1 tablet (160 mg total) by mouth daily. 90 tablet 3  . irbesartan-hydrochlorothiazide (AVALIDE) 150-12.5 MG tablet Take 1 tablet by mouth every morning. 90 tablet 3  . levothyroxine (SYNTHROID, LEVOTHROID) 125 MCG tablet Take 1 tablet (125 mcg total) by mouth every morning. 90 tablet 3  . liothyronine (CYTOMEL) 25 MCG tablet Take 0.5 tablets (12.5 mcg  total) by mouth every evening. Needs generic 90 tablet 3  . mirabegron ER (MYRBETRIQ) 50 MG TB24 tablet Take 1 tablet (50 mg total) by mouth daily. 90 tablet 3  . montelukast (SINGULAIR) 10 MG tablet Take 1 tablet (10 mg total) by mouth daily. 90 tablet 3  . Multiple Vitamin (MULTIVITAMIN) tablet Take 1 tablet by mouth daily.     . Potassium Citrate 15 MEQ (1620 MG) TBCR Take 1 tablet by mouth 3 (three) times daily. 270 tablet 3  . propranolol ER (INDERAL LA) 120 MG 24 hr capsule Take 1 capsule (120 mg total) by mouth daily. 90 capsule 3  . SUMAtriptan (IMITREX) 100 MG tablet TAKE 1 TABLET FOR HEADACHE MAY REPEAT ONCE IN 2 HOURS IF NEEDED. MAXIMUM OF 2    TABLETS IN ONE DAY. 27 tablet 3  . vitamin B-12 (CYANOCOBALAMIN) 500 MCG tablet Take 500 mcg by mouth daily.       No current facility-administered medications on file prior to visit.     Review of Systems Review of Systems  Constitutional: Negative for fever, appetite change, fatigue and unexpected weight change.  Eyes: Negative for pain and visual disturbance.  Respiratory: Negative for cough and shortness of breath.   Cardiovascular: Negative for cp or palpitations    Gastrointestinal: Negative for nausea, diarrhea and constipation. pos for belching/bloating/ abd discomfort  Genitourinary: Negative for urgency and frequency.  Skin: Negative for pallor or rash   Neurological: Negative for weakness, light-headedness, numbness and headaches.  Hematological: Negative for adenopathy. Does not bruise/bleed easily.  Psychiatric/Behavioral: Negative for dysphoric mood. The patient is not nervous/anxious.         Objective:   Physical Exam  Constitutional: She appears well-developed and well-nourished. No distress.  overwt and well app  HENT:  Head: Normocephalic and atraumatic.  Mouth/Throat: Oropharynx is clear and moist.  Eyes: Conjunctivae and EOM are normal. Pupils are equal, round, and reactive to light. No scleral icterus.  Neck:  Normal range of motion. Neck supple.  Cardiovascular: Normal rate, regular rhythm and normal heart sounds.   Pulmonary/Chest: Effort normal and breath sounds normal. No respiratory distress. She has no wheezes. She has no rales.  Abdominal: Soft. Bowel sounds are normal. She exhibits no shifting dullness, no distension, no abdominal bruit, no pulsatile midline mass and no mass. There is no hepatosplenomegaly. There is tenderness in the epigastric area and left lower quadrant. There is no rigidity, no rebound, no guarding, no CVA tenderness, no tenderness at McBurney's point and negative Murphy's sign.  Lymphadenopathy:    She has no cervical adenopathy.  Neurological: She is alert.  Skin: Skin is warm and dry. No erythema. No pallor.  Psychiatric: She has a normal mood and affect.  Assessment & Plan:   Problem List Items Addressed This Visit      Other   Dyspepsia    Pt is having pronounced belching after eating with abd discomfort and decreased appetite  Relieved with alka selzer No RUQ pain  Suspect acid related (in pt with known HH) Will try omeprazole 20 mg daily  Diet info given  Info on gerd as well  Update-if not imp in 7 days please update  LLQ pain warrants further eval if this continues-? If related   (working on setting up her screening colonoscopy as well)

## 2017-04-14 NOTE — Patient Instructions (Signed)
Stick to small meals/ avoid spicy foods Avoid advil/aleve or other anti inflammatories  Try omeprazole (generic prilosec) once daily in am   Update if not starting to improve in a week or if worsening    We may need to visit the issue of lower abdominal pain

## 2017-05-12 ENCOUNTER — Ambulatory Visit
Admission: RE | Admit: 2017-05-12 | Discharge: 2017-05-12 | Disposition: A | Discharge status: Home / Self Care | Payer: Medicare Other | Source: Ambulatory Visit | Attending: Family Medicine | Admitting: Family Medicine

## 2017-05-12 DIAGNOSIS — Z1231 Encounter for screening mammogram for malignant neoplasm of breast: Secondary | ICD-10-CM

## 2017-05-13 ENCOUNTER — Encounter: Payer: Self-pay | Admitting: *Deleted

## 2017-05-19 ENCOUNTER — Encounter: Payer: Self-pay | Admitting: Family Medicine

## 2017-12-31 ENCOUNTER — Other Ambulatory Visit: Payer: Self-pay | Admitting: Family Medicine

## 2018-02-18 ENCOUNTER — Encounter: Payer: Self-pay | Admitting: Family Medicine

## 2018-02-24 ENCOUNTER — Encounter: Payer: Self-pay | Admitting: Family Medicine

## 2018-02-24 ENCOUNTER — Ambulatory Visit: Payer: Medicare Other | Admitting: Family Medicine

## 2018-02-24 VITALS — BP 106/62 | HR 83 | Temp 98.1°F | Ht 65.5 in | Wt 180.5 lb

## 2018-02-24 DIAGNOSIS — D75839 Thrombocytosis, unspecified: Secondary | ICD-10-CM

## 2018-02-24 DIAGNOSIS — N390 Urinary tract infection, site not specified: Secondary | ICD-10-CM | POA: Insufficient documentation

## 2018-02-24 DIAGNOSIS — N3001 Acute cystitis with hematuria: Secondary | ICD-10-CM

## 2018-02-24 DIAGNOSIS — R32 Unspecified urinary incontinence: Secondary | ICD-10-CM

## 2018-02-24 DIAGNOSIS — N8110 Cystocele, unspecified: Secondary | ICD-10-CM | POA: Diagnosis not present

## 2018-02-24 DIAGNOSIS — D473 Essential (hemorrhagic) thrombocythemia: Secondary | ICD-10-CM | POA: Diagnosis not present

## 2018-02-24 LAB — POC URINALSYSI DIPSTICK (AUTOMATED)
Bilirubin, UA: NEGATIVE
Blood, UA: 200
Glucose, UA: NEGATIVE
Ketones, UA: NEGATIVE
NITRITE UA: NEGATIVE
SPEC GRAV UA: 1.015 (ref 1.010–1.025)
UROBILINOGEN UA: 0.2 U/dL
pH, UA: 6 (ref 5.0–8.0)

## 2018-02-24 MED ORDER — SULFAMETHOXAZOLE-TRIMETHOPRIM 800-160 MG PO TABS: 1.0000 | ORAL_TABLET | Freq: Two times a day (BID) | ORAL | 0 refills | Status: DC

## 2018-02-24 NOTE — Progress Notes (Signed)
Subjective:    Patient ID: [name], female    DOB: May 29, 1951, 67 y.o.   MRN: [id number]  HPI Here for urinary symptoms   Frequency /urgency  Pain to urinate at first -not bad now  Incontinence -worse lately  Has seen some blood in urine last nt and this am  Unsure if she has some bladder prolapse (she can feel it coming out) and more   Had hysterectomy in the past   Wt Readings from Last 3 Encounters:  02/24/18 180 lb 8 oz (81.9 kg)  04/14/17 174 lb (78.9 kg)  04/09/17 178 lb (80.7 kg)   Temp: 98.1 F (36.7 C)   H/o kidney stones (no symptoms now)   Takes mybetriq for mixed incontinence   Lab Results  Component Value Date   CREATININE 1.16 04/04/2017   BUN 37 (H) 04/04/2017   NA 137 04/04/2017   K 4.2 04/04/2017   CL 102 04/04/2017   CO2 26 04/04/2017      UA is pos today Results for orders placed or performed in visit on 02/24/18  POCT Urinalysis Dipstick (Automated)  Result Value Ref Range   Color, UA Amber    Clarity, UA Pus/Cloudy    Glucose, UA Negative    Bilirubin, UA Negative    Ketones, UA Negaitve    Spec Grav, UA 1.015 1.010 - 1.025   Blood, UA 200 Ery/uL    pH, UA 6.0 5.0 - 8.0   Protein, UA 300 mg/dL    Urobilinogen, UA 0.2 0.2 or 1.0 E.U./dL   Nitrite, UA Negative    Leukocytes, UA Large (3+) (A) Negative   is drinking water   Symptoms started all the sudden Had chills  No nausea   Patient Active Problem List   Diagnosis Date Noted  . UTI (urinary tract infection) 02/24/2018  . Bladder prolapse, female, 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  . Dyspepsia 04/14/2017  . Colon cancer screening 04/09/2017  . Elevated glucose level 03/30/2017  . Medicare annual wellness visit, initial 04/04/2016  . Other screening mammogram 12/31/2011  . Post-menopausal 12/31/2011  . Routine general medical examination at a health care facility 12/23/2011  . 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  . Hypothyroidism 11/08/2008  . FEVER BLISTER  09/23/2007  . Hypertriglyceridemia 09/23/2007  . Essential hypertension 09/23/2007  . ALLERGIC RHINITIS 09/23/2007  . ASTHMA 09/23/2007  . HYPOKALEMIA 04/16/2007  . LEUKOCYTOSIS NOS 04/09/2007  . Thrombocytosis ([place]) 04/09/2007   Past Medical History:  Diagnosis Date  . Allergic rhinitis   . Anemia, unspecified    b12 def.  . Asthma    controlled w/ singulair  . Herpes simplex without mention of complication    controlled w/ meds (no current fever blisters)  . History of kidney stones   . HLD (hyperlipidemia)    borderline-  no meds  . HTN (hypertension)    echo- 2006  . Hyperthyroidism hx ptu 2006   takes synthroid  . Leukocytosis, unspecified hx of 10 yrs ago   no problem since  . Migraine   . Mixed incontinence    urge and stress incontinence  . Renal cyst right   . Ureteral calculi right    s/p laser  litho w/ stone extraction 01-28-11   Past Surgical History:  Procedure Laterality Date  . BREAST REDUCTION SURGERY  1994  . CERVICAL FUSION  2000   fusion c4-6  . CERVICAL FUSION  2005   fusion c6-7 and plate removal O3-7  . CYSTOSCOPY  W/ RETROGRADES  10/21/2011   Procedure: CYSTOSCOPY WITH RETROGRADE PYELOGRAM;  Surgeon: Ailene Rud, MD;  Location: [facility];  Service: Urology;  Laterality: Right;  CYSTOSCOPY RIGHT RETROGRADE, PYELOGRAM  URETEROSCOPY WITH HOLMIUM LASER AND STONE EXTRACTION  . CYSTOSCOPY/RETROGRADE/URETEROSCOPY/STONE EXTRACTION WITH BASKET  01-28-11   right   . REDUCTION MAMMAPLASTY Bilateral 1995  . TONSILLECTOMY  1960  . VAGINAL HYSTERECTOMY  1987   fibroids/anemia   Social History   Tobacco Use  . Smoking status: Never Smoker  . Smokeless tobacco: Never Used  Substance Use Topics  . Alcohol use: No    Alcohol/week: 0.0 oz  . Drug use: No   Family History  Problem Relation Age of Onset  . Heart failure Father   . Osteoporosis Mother   . Diabetes Mother   . Diabetes Unknown        Grandfather  . Coronary  artery disease Unknown        Grandmother  . Breast cancer Neg Hx    Allergies  Allergen Reactions  . Contrast Media [Iodinated Diagnostic Agents] Anaphylaxis  . Shellfish-Derived Products Anaphylaxis   Current Outpatient Medications on File Prior to Visit  Medication Sig Dispense Refill  . acyclovir (ZOVIRAX) 200 MG capsule TAKE 2 CAPSULES DAILY 180 capsule 3  . calcium-vitamin D (OSCAL WITH D) 500-200 MG-UNIT per tablet Take 2 tablets by mouth daily.     . fenofibrate 160 MG tablet TAKE 1 TABLET BY MOUTH  DAILY 90 tablet 1  . irbesartan-hydrochlorothiazide (AVALIDE) 150-12.5 MG tablet Take 1 tablet by mouth every morning. 90 tablet 3  . levothyroxine (SYNTHROID, LEVOTHROID) 125 MCG tablet Take 1 tablet (125 mcg total) by mouth every morning. 90 tablet 3  . liothyronine (CYTOMEL) 25 MCG tablet Take 0.5 tablets (12.5 mcg total) by mouth every evening. Needs generic 90 tablet 3  . mirabegron ER (MYRBETRIQ) 50 MG TB24 tablet Take 1 tablet (50 mg total) by mouth daily. 90 tablet 3  . montelukast (SINGULAIR) 10 MG tablet TAKE 1 TABLET BY MOUTH  DAILY 90 tablet 1  . Multiple Vitamin (MULTIVITAMIN) tablet Take 1 tablet by mouth daily.     Marland Kitchen omeprazole (PRILOSEC) 20 MG capsule Take 1 capsule (20 mg total) by mouth daily. In the morning before breakfast and other medicines 30 capsule 5  . Potassium Citrate 15 MEQ (1620 MG) TBCR Take 1 tablet by mouth 3 (three) times daily. 270 tablet 3  . propranolol ER (INDERAL LA) 120 MG 24 hr capsule Take 1 capsule (120 mg total) by mouth daily. 90 capsule 3  . SUMAtriptan (IMITREX) 100 MG tablet TAKE 1 TABLET FOR HEADACHE MAY REPEAT ONCE IN 2 HOURS IF NEEDED. MAXIMUM OF 2    TABLETS IN ONE DAY. 27 tablet 3  . vitamin B-12 (CYANOCOBALAMIN) 500 MCG tablet Take 500 mcg by mouth daily.       No current facility-administered medications on file prior to visit.      Review of Systems  Constitutional: Positive for fatigue. Negative for activity change, appetite  change and fever.  HENT: Negative for congestion and sore throat.   Eyes: Negative for itching and visual disturbance.  Respiratory: Negative for cough and shortness of breath.   Cardiovascular: Negative for leg swelling.  Gastrointestinal: Negative for abdominal distention, abdominal pain, constipation, diarrhea and nausea.  Endocrine: Negative for cold intolerance and polydipsia.  Genitourinary: Positive for dysuria, frequency, hematuria and urgency. Negative for difficulty urinating, flank pain, vaginal discharge and vaginal pain.  Musculoskeletal: Negative for myalgias.  Skin: Negative for rash.  Allergic/Immunologic: Negative for immunocompromised state.  Neurological: Negative for dizziness and weakness.  Hematological: Negative for adenopathy.       Objective:   Physical Exam  Constitutional: She appears well-developed and well-nourished. No distress.  Well appearing   HENT:  Head: Normocephalic and atraumatic.  Eyes: Conjunctivae and EOM are normal. Pupils are equal, round, and reactive to light.  Neck: Normal range of motion. Neck supple.  Cardiovascular: Normal rate, regular rhythm and normal heart sounds.  Pulmonary/Chest: Effort normal and breath sounds normal.  Abdominal: Soft. Bowel sounds are normal. She exhibits no distension. There is tenderness. There is no rebound.  No cva tenderness  Mild suprapubic tenderness  Genitourinary:  Genitourinary Comments: Cystocele noted  Nl vaginal mucosa and no d/c  Musculoskeletal: She exhibits no edema.  Lymphadenopathy:    She has no cervical adenopathy.  Neurological: She is alert.  Skin: No rash noted.  Psychiatric: She has a normal mood and affect.          Assessment & Plan:   Problem List Items Addressed This Visit      Genitourinary   Bladder prolapse, female, acquired    Worsened lately with more mixed incontinence symptoms  She is interested in tx  Will ref to urology/has seen Dr Gaynelle Arabian in the past         Relevant Orders   Ambulatory referral to Urology   UTI (urinary tract infection) - Primary    With hematuria  Pos ua  tx with septra DS [employer] fluids Handout given  cx pending Update if not starting to improve in a week or if worsening        Relevant Medications   sulfamethoxazole-trimethoprim (BACTRIM DS,SEPTRA DS) 800-160 MG tablet     Hematopoietic and Hemostatic   Thrombocytosis ([facility])    Last pl ct 558       Other Visit Diagnoses    Urinary incontinence, unspecified type       Relevant Orders   POCT Urinalysis Dipstick (Automated) (Completed)   Ambulatory referral to Urology

## 2018-02-24 NOTE — Assessment & Plan Note (Signed)
Last pl ct 558

## 2018-02-24 NOTE — Assessment & Plan Note (Signed)
With hematuria  Pos ua  tx with septra DS Inc fluids Handout given  cx pending Update if not starting to improve in a week or if worsening

## 2018-02-24 NOTE — Patient Instructions (Signed)
Drink your water  Take the septra antibiotic as directed  We will culture urine and update you with a result  We will refer you to urology for bladder prolapse   Update if not starting to improve in a week or if worsening

## 2018-02-24 NOTE — Assessment & Plan Note (Signed)
Worsened lately with more mixed incontinence symptoms  She is interested in tx  Will ref to urology/has seen Dr Gaynelle Arabian in the past

## 2018-02-25 LAB — URINE CULTURE
MICRO NUMBER: 90345091
SPECIMEN QUALITY: ADEQUATE

## 2018-03-18 ENCOUNTER — Other Ambulatory Visit: Payer: Self-pay | Admitting: Family Medicine

## 2018-04-05 ENCOUNTER — Telehealth: Payer: Self-pay | Admitting: Family Medicine

## 2018-04-05 DIAGNOSIS — D473 Essential (hemorrhagic) thrombocythemia: Secondary | ICD-10-CM

## 2018-04-05 DIAGNOSIS — E039 Hypothyroidism, unspecified: Secondary | ICD-10-CM

## 2018-04-05 DIAGNOSIS — D75839 Thrombocytosis, unspecified: Secondary | ICD-10-CM

## 2018-04-05 DIAGNOSIS — R7309 Other abnormal glucose: Secondary | ICD-10-CM

## 2018-04-05 DIAGNOSIS — E781 Pure hyperglyceridemia: Secondary | ICD-10-CM

## 2018-04-05 DIAGNOSIS — I10 Essential (primary) hypertension: Secondary | ICD-10-CM

## 2018-04-05 NOTE — Telephone Encounter (Signed)
-----   Message from Eustace Pen, LPN sent at [phone]/49/4473  4:24 PM EDT ----- Regarding: Labs 5/1 Lab orders needed. Thank you.  Insurance:  [employer]

## 2018-04-08 ENCOUNTER — Ambulatory Visit (INDEPENDENT_AMBULATORY_CARE_PROVIDER_SITE_OTHER): Payer: Medicare Other

## 2018-04-08 ENCOUNTER — Ambulatory Visit: Payer: Medicare Other

## 2018-04-08 VITALS — BP 110/82 | HR 81 | Temp 98.4°F | Ht 65.5 in | Wt 180.5 lb

## 2018-04-08 DIAGNOSIS — E039 Hypothyroidism, unspecified: Secondary | ICD-10-CM | POA: Diagnosis not present

## 2018-04-08 DIAGNOSIS — R7309 Other abnormal glucose: Secondary | ICD-10-CM | POA: Diagnosis not present

## 2018-04-08 DIAGNOSIS — E781 Pure hyperglyceridemia: Secondary | ICD-10-CM

## 2018-04-08 DIAGNOSIS — D75839 Thrombocytosis, unspecified: Secondary | ICD-10-CM

## 2018-04-08 DIAGNOSIS — D473 Essential (hemorrhagic) thrombocythemia: Secondary | ICD-10-CM | POA: Diagnosis not present

## 2018-04-08 DIAGNOSIS — I10 Essential (primary) hypertension: Secondary | ICD-10-CM | POA: Diagnosis not present

## 2018-04-08 DIAGNOSIS — Z0000 Encounter for general adult medical examination without abnormal findings: Secondary | ICD-10-CM

## 2018-04-08 LAB — CBC WITH DIFFERENTIAL/PLATELET
BASOS PCT: 0.9 % (ref 0.0–3.0)
Basophils Absolute: 0.1 10*3/uL (ref 0.0–0.1)
Eosinophils Absolute: 0.3 10*3/uL (ref 0.0–0.7)
Eosinophils Relative: 3.3 % (ref 0.0–5.0)
HEMATOCRIT: 35.4 % — AB (ref 36.0–46.0)
Hemoglobin: 11.9 g/dL — ABNORMAL LOW (ref 12.0–15.0)
Lymphocytes Relative: 21.1 % (ref 12.0–46.0)
Lymphs Abs: 1.9 10*3/uL (ref 0.7–4.0)
MCHC: 33.7 g/dL (ref 30.0–36.0)
MCV: 87.1 fl (ref 78.0–100.0)
MONOS PCT: 6.4 % (ref 3.0–12.0)
Monocytes Absolute: 0.6 10*3/uL (ref 0.1–1.0)
NEUTROS ABS: 6 10*3/uL (ref 1.4–7.7)
Neutrophils Relative %: 68.3 % (ref 43.0–77.0)
PLATELETS: 511 10*3/uL — AB (ref 150.0–400.0)
RBC: 4.06 Mil/uL (ref 3.87–5.11)
RDW: 13.9 % (ref 11.5–15.5)
WBC: 8.8 10*3/uL (ref 4.0–10.5)

## 2018-04-08 LAB — LIPID PANEL
CHOL/HDL RATIO: 3
Cholesterol: 179 mg/dL (ref 0–200)
HDL: 67.8 mg/dL (ref >39.00)
NONHDL: 111.36
TRIGLYCERIDES: 237 mg/dL — AB (ref 0.0–149.0)
VLDL: 47.4 mg/dL — ABNORMAL HIGH (ref 0.0–40.0)

## 2018-04-08 LAB — COMPREHENSIVE METABOLIC PANEL
ALT: 14 U/L (ref 0–35)
AST: 19 U/L (ref 0–37)
Albumin: 4.3 g/dL (ref 3.5–5.2)
Alkaline Phosphatase: 50 U/L (ref 39–117)
BUN: 28 mg/dL — ABNORMAL HIGH (ref 6–23)
CHLORIDE: 102 meq/L (ref 96–112)
CO2: 25 meq/L (ref 19–32)
Calcium: 9.7 mg/dL (ref 8.4–10.5)
Creatinine, Ser: 1.35 mg/dL — ABNORMAL HIGH (ref 0.40–1.20)
GFR: 41.53 mL/min — AB (ref >60.00)
Glucose, Bld: 92 mg/dL (ref 70–99)
Potassium: 4.3 mEq/L (ref 3.5–5.1)
Sodium: 137 mEq/L (ref 135–145)
Total Bilirubin: 0.5 mg/dL (ref 0.2–1.2)
Total Protein: 7.5 g/dL (ref 6.0–8.3)

## 2018-04-08 LAB — LDL CHOLESTEROL, DIRECT: Direct LDL: 90 mg/dL

## 2018-04-08 LAB — HEMOGLOBIN A1C: HEMOGLOBIN A1C: 5.9 % (ref 4.6–6.5)

## 2018-04-08 LAB — TSH: TSH: 2.77 u[IU]/mL (ref 0.35–4.50)

## 2018-04-08 NOTE — Progress Notes (Signed)
Subjective:   [name] is a 67 y.o. female who presents for Medicare Annual (Subsequent) preventive examination.  Review of Systems:  N/A Cardiac Risk Factors include: advanced age (>70men, >43 women);dyslipidemia;hypertension     Objective:     Vitals: BP 110/82 (BP Location: Right Arm, Patient Position: Sitting, Cuff Size: Normal)   Pulse 81   Temp 98.4 F (36.9 C) (Oral)   Ht 5' 5.5" (1.664 m) Comment: no shoes  Wt 180 lb 8 oz (81.9 kg)   SpO2 97%   BMI 29.58 kg/m   Body mass index is 29.58 kg/m.  Advanced Directives 04/08/2018 04/04/2017 10/18/2011  Does Patient Have a Medical Advance Directive? Yes Yes Patient has advance directive, copy not in chart  Type of Advance Directive [place];Living will [place];Living will -  Copy of [place] in Chart? Yes Yes -    Tobacco Social History   Tobacco Use  Smoking Status Never Smoker  Smokeless Tobacco Never Used     Counseling given: No   Clinical Intake:  Pre-visit preparation completed: Yes  Pain : No/denies pain Pain Score: 0-No pain     Nutritional Status: BMI 25 -29 Overweight Nutritional Risks: None Diabetes: No  How often do you need to have someone help you when you read instructions, pamphlets, or other written materials from your doctor or pharmacy?: 1 - Never What is the last grade level you completed in school?: Bachelors degree  Interpreter Needed?: No  Comments: pt lives with spouse Information entered by :: LPinson, LPN  Past Medical History:  Diagnosis Date  . Allergic rhinitis   . Anemia, unspecified    b12 def.  . Asthma    controlled w/ singulair  . Herpes simplex without mention of complication    controlled w/ meds (no current fever blisters)  . History of kidney stones   . HLD (hyperlipidemia)    borderline-  no meds  . HTN (hypertension)    echo- 2006  . Hyperthyroidism hx ptu 2006   takes synthroid  .  Leukocytosis, unspecified hx of 10 yrs ago   no problem since  . Migraine   . Mixed incontinence    urge and stress incontinence  . Renal cyst right   . Ureteral calculi right    s/p laser  litho w/ stone extraction 01-28-11   Past Surgical History:  Procedure Laterality Date  . BREAST REDUCTION SURGERY  1994  . CERVICAL FUSION  2000   fusion c4-6  . CERVICAL FUSION  2005   fusion c6-7 and plate removal Y0-9  . CYSTOSCOPY W/ RETROGRADES  10/21/2011   Procedure: CYSTOSCOPY WITH RETROGRADE PYELOGRAM;  Surgeon: Ailene Rud, MD;  Location: [facility];  Service: Urology;  Laterality: Right;  CYSTOSCOPY RIGHT RETROGRADE, PYELOGRAM  URETEROSCOPY WITH HOLMIUM LASER AND STONE EXTRACTION  . CYSTOSCOPY/RETROGRADE/URETEROSCOPY/STONE EXTRACTION WITH BASKET  01-28-11   right   . REDUCTION MAMMAPLASTY Bilateral 1995  . TONSILLECTOMY  1960  . VAGINAL HYSTERECTOMY  1987   fibroids/anemia   Family History  Problem Relation Age of Onset  . Heart failure Father   . Osteoporosis Mother   . Diabetes Mother   . Diabetes Unknown        Grandfather  . Coronary artery disease Unknown        Grandmother  . Breast cancer Neg Hx    Social History   Socioeconomic History  . Marital status: Married  Spouse name: Not on file  . Number of children: 1  . Years of education: Not on file  . Highest education level: Not on file  Occupational History  . Occupation: Publishing rights manager: [employer]  . Financial resource strain: Not on file  . Food insecurity:    Worry: Not on file    Inability: Not on file  . Transportation needs:    Medical: Not on file    Non-medical: Not on file  Tobacco Use  . Smoking status: Never Smoker  . Smokeless tobacco: Never Used  Substance and Sexual Activity  . Alcohol use: No    Alcohol/week: 0.0 oz  . Drug use: No  . Sexual activity: Not on file  Lifestyle  . Physical activity:    Days per week: Not on file      Minutes per session: Not on file  . Stress: Not on file  Relationships  . Social connections:    Talks on phone: Not on file    Gets together: Not on file    Attends religious service: Not on file    Active member of club or organization: Not on file    Attends meetings of clubs or organizations: Not on file    Relationship status: Not on file  Other Topics Concern  . 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 acyclovir (ZOVIRAX) 200 MG capsule TAKE 2 CAPSULES DAILY  . calcium-vitamin D (OSCAL WITH D) 500-200 MG-UNIT per tablet Take 2 tablets by mouth daily.   . fenofibrate 160 MG tablet TAKE 1 TABLET BY MOUTH  DAILY  . irbesartan-hydrochlorothiazide (AVALIDE) 150-12.5 MG tablet TAKE 1 TABLET BY MOUTH  EVERY MORNING  . liothyronine (CYTOMEL) 25 MCG tablet Take 0.5 tablets (12.5 mcg total) by mouth every evening. Needs generic  . mirabegron ER (MYRBETRIQ) 50 MG TB24 tablet Take 1 tablet (50 mg total) by mouth daily.  . montelukast (SINGULAIR) 10 MG tablet TAKE 1 TABLET BY MOUTH  DAILY  . Multiple Vitamin (MULTIVITAMIN) tablet Take 1 tablet by mouth daily.   . Potassium Citrate 15 MEQ (1620 MG) TBCR Take 1 tablet by mouth 3 (three) times daily.  . propranolol ER (INDERAL LA) 120 MG 24 hr capsule TAKE 1 CAPSULE BY MOUTH  DAILY  . SUMAtriptan (IMITREX) 100 MG tablet TAKE 1 TABLET FOR HEADACHE MAY REPEAT ONCE IN 2 HOURS IF NEEDED. MAXIMUM OF 2    TABLETS IN ONE DAY.  . SYNTHROID 125 MCG tablet TAKE 1 TABLET BY MOUTH  EVERY MORNING  . vitamin B-12 (CYANOCOBALAMIN) 500 MCG tablet Take 500 mcg by mouth daily.    . [DISCONTINUED] omeprazole (PRILOSEC) 20 MG capsule Take 1 capsule (20 mg total) by mouth daily. In the morning before breakfast and other medicines  . [DISCONTINUED] sulfamethoxazole-trimethoprim (BACTRIM DS,SEPTRA DS) 800-160 MG  tablet Take 1 tablet by mouth 2 (two) times daily.   No facility-administered encounter medications on file as of 04/08/2018.     Activities of Daily Living In your present state of health, do you have any difficulty performing the following activities: 04/08/2018  Hearing? N  Vision? N  Difficulty concentrating or making decisions? N  Walking or climbing stairs? N  Dressing or bathing? N  Doing  errands, shopping? N  Preparing Food and eating ? N  Using the Toilet? N  In the past six months, have you accidently leaked urine? Y  Do you have problems with loss of bowel control? N  Managing your Medications? N  Managing your Finances? N  Housekeeping or managing your Housekeeping? N  Some recent data might be hidden    Patient Care Team: Tower, Wynelle Fanny, MD as PCP - General    Assessment:   This is a routine wellness examination for Relda.   Hearing Screening   125Hz  250Hz  500Hz  1000Hz  2000Hz  3000Hz  4000Hz  6000Hz  8000Hz   Right ear:   40 40 40  40    Left ear:   40 40 40  40      Visual Acuity Screening   Right eye Left eye Both eyes  Without correction: 20/25 20/25 20/25   With correction:       Exercise Activities and Dietary recommendations Current Exercise Habits: Home exercise routine, Type of exercise: walking;Other - see comments(gardening), Time (Minutes): 45, Frequency (Times/Week): 3, Weekly Exercise (Minutes/Week): 135, Intensity: Moderate, Exercise limited by: None identified  Goals    . Patient Stated     Starting 04/08/2018, I will continue to take medications as prescribed.        Fall Risk Fall Risk  04/08/2018 04/04/2017 04/03/2016  Falls in the past year? No No No   Depression Screen PHQ 2/9 Scores 04/08/2018 04/04/2017 04/03/2016 03/02/2013  PHQ - 2 Score 0 0 0 0  PHQ- 9 Score 0 - - -     Cognitive Function MMSE - Mini Mental State Exam 04/08/2018 04/04/2017  Orientation to time 5 5  Orientation to Place 5 5  Registration 3 3  Attention/ Calculation 0 0   Recall 3 3  Language- name 2 objects 0 0  Language- repeat 1 1  Language- follow 3 step command 3 3  Language- read & follow direction 0 0  Write a sentence 0 0  Copy design 0 0  Total score 20 20     PLEASE NOTE: A Mini-Cog screen was completed. Maximum score is 20. A value of 0 denotes this part of [place] MMSE was not completed or the patient failed this part of the Mini-Cog screening.   Mini-Cog Screening Orientation to Time - Max 5 pts Orientation to Place - Max 5 pts Registration - Max 3 pts Recall - Max 3 pts Language Repeat - Max 1 pts Language Follow 3 Step Command - Max 3 pts     Immunization History  Administered Date(s) Administered  . Influenza Split 08/10/2011  . Influenza Whole 10/10/2007, 08/17/2008, 08/31/2009, 08/09/2010  . Influenza, High Dose Seasonal PF 07/18/2017  . Influenza,inj,Quad PF,6+ Mos 08/04/2015, 09/02/2016  . Influenza-Unspecified 08/09/2014  . Pneumococcal Conjugate-13 11/28/2015  . Pneumococcal Polysaccharide-23 08/25/2003, 10/10/2007  . Pneumococcal-Unspecified 11/27/2016  . Td 10/21/2006  . Tdap 03/20/2015  . Zoster 03/25/2012    Screening Tests Health Maintenance  Topic Date Due  . COLONOSCOPY  12/08/2018 (Originally 01/09/2017)  . MAMMOGRAM  05/12/2018  . INFLUENZA VACCINE  07/09/2018  . TETANUS/TDAP  03/19/2025  . DEXA SCAN  Completed  . Hepatitis C Screening  Completed  . PNA vac Low Risk Adult  Completed       Plan:     I have personally reviewed, addressed, and noted the following in the patient's chart:  A. Medical and social history B. Use of alcohol, tobacco or illicit drugs  C. Current medications and  supplements D. Functional ability and status E.  Nutritional status F.  Physical activity G. Advance directives H. List of other physicians I.  Hospitalizations, surgeries, and ER visits in previous 12 months J.  [place] to include hearing, vision, cognitive, depression L. Referrals and  appointments - none  In addition, I have reviewed and discussed with patient certain preventive protocols, quality metrics, and best practice recommendations. A written personalized care plan for preventive services as well as general preventive health recommendations were provided to patient.  See attached scanned questionnaire for additional information.   Signed,   Lindell Noe, MHA, BS, LPN Health Coach

## 2018-04-08 NOTE — Patient Instructions (Addendum)
[name] , Thank you for taking time to come for your Medicare Wellness Visit. I appreciate your ongoing commitment to your health goals. Please review the following plan we discussed and let me know if I can assist you in the future.   These are the goals we discussed: Goals    . Patient Stated     Starting 04/08/2018, I will continue to take medications as prescribed.        This is a list of the screening recommended for you and due dates:  Health Maintenance  Topic Date Due  . Colon Cancer Screening  12/08/2018*  . Mammogram  05/12/2018  . Flu Shot  07/09/2018  . Tetanus Vaccine  03/19/2025  . DEXA scan (bone density measurement)  Completed  .  Hepatitis C: One time screening is recommended by Center for Disease Control  (CDC) for  adults born from [birth year] through 1966.   Completed  . Pneumonia vaccines  Completed  *Topic was postponed. The date shown is not the original due date.   Preventive Care for Adults  A healthy lifestyle and preventive care can promote health and wellness. Preventive health guidelines for adults include the following key practices.  . A routine yearly physical is a good way to check with your health care provider about your health and preventive screening. It is a chance to share any concerns and updates on your health and to receive a thorough exam.  . Visit your dentist for a routine exam and preventive care every 6 months. Brush your teeth twice a day and floss once a day. Good oral hygiene prevents tooth decay and gum disease.  . The frequency of eye exams is based on your age, health, family medical history, use  of contact lenses, and other factors. Follow your health care provider's recommendations for frequency of eye exams.  . Eat a healthy diet. Foods like vegetables, fruits, whole grains, low-fat dairy products, and lean protein foods contain the nutrients you need without too many calories. Decrease your intake of foods high in solid fats,  added sugars, and salt. Eat the right amount of calories for you. Get information about a proper diet from your health care provider, if necessary.  . Regular physical exercise is one of the most important things you can do for your health. Most adults should get at least 150 minutes of moderate-intensity exercise (any activity that increases your heart rate and causes you to sweat) each week. In addition, most adults need muscle-strengthening exercises on 2 or more days a week.  Silver Sneakers may be a benefit available to you. To determine eligibility, you may visit the website: www.silversneakers.com or contact program at (858)485-0285 Mon-Fri between 8AM-8PM.   . Maintain a healthy weight. The body mass index (BMI) is a screening tool to identify possible weight problems. It provides an estimate of body fat based on height and weight. Your health care provider can find your BMI and can help you achieve or maintain a healthy weight.   For adults 20 years and older: ? A BMI below 18.5 is considered underweight. ? A BMI of 18.5 to 24.9 is normal. ? A BMI of 25 to 29.9 is considered overweight. ? A BMI of 30 and above is considered obese.   . Maintain normal blood lipids and cholesterol levels by exercising and minimizing your intake of saturated fat. Eat a balanced diet with plenty of fruit and vegetables. Blood tests for lipids and cholesterol should begin at  age 41 and be repeated every 5 years. If your lipid or cholesterol levels are high, you are over 50, or you are at high risk for heart disease, you may need your cholesterol levels checked more frequently. Ongoing high lipid and cholesterol levels should be treated with medicines if diet and exercise are not working.  . If you smoke, find out from your health care provider how to quit. If you do not use tobacco, please do not start.  . If you choose to drink alcohol, please do not consume more than 2 drinks per day. One drink is  considered to be 12 ounces (355 mL) of beer, 5 ounces (148 mL) of wine, or 1.5 ounces (44 mL) of liquor.  . If you are 61-37 years old, ask your health care provider if you should take aspirin to prevent strokes.  . Use sunscreen. Apply sunscreen liberally and repeatedly throughout the day. You should seek shade when your shadow is shorter than you. Protect yourself by wearing long sleeves, pants, a wide-brimmed hat, and sunglasses year round, whenever you are outdoors.  . Once a month, do a whole body skin exam, using a mirror to look at the skin on your back. Tell your health care provider of new moles, moles that have irregular borders, moles that are larger than a pencil eraser, or moles that have changed in shape or color.

## 2018-04-08 NOTE — Progress Notes (Signed)
PCP notes:   Health maintenance:  Colonoscopy - addressed  Abnormal screenings:   None  Patient concerns:   None  Nurse concerns:  None  Next PCP appt:   04/15/18 @ 1130  I reviewed health advisor's note, was available for consultation, and agree with documentation and plan. Loura Pardon MD

## 2018-04-13 DIAGNOSIS — N200 Calculus of kidney: Secondary | ICD-10-CM | POA: Diagnosis not present

## 2018-04-13 DIAGNOSIS — R350 Frequency of micturition: Secondary | ICD-10-CM | POA: Diagnosis not present

## 2018-04-15 ENCOUNTER — Encounter: Payer: Self-pay | Admitting: Gastroenterology

## 2018-04-15 ENCOUNTER — Encounter: Payer: Self-pay | Admitting: Family Medicine

## 2018-04-15 ENCOUNTER — Ambulatory Visit (INDEPENDENT_AMBULATORY_CARE_PROVIDER_SITE_OTHER): Payer: Medicare Other | Admitting: Family Medicine

## 2018-04-15 VITALS — BP 120/82 | HR 81 | Temp 97.8°F | Ht 65.5 in | Wt 181.0 lb

## 2018-04-15 DIAGNOSIS — Z1211 Encounter for screening for malignant neoplasm of colon: Secondary | ICD-10-CM | POA: Diagnosis not present

## 2018-04-15 DIAGNOSIS — D473 Essential (hemorrhagic) thrombocythemia: Secondary | ICD-10-CM | POA: Diagnosis not present

## 2018-04-15 DIAGNOSIS — E039 Hypothyroidism, unspecified: Secondary | ICD-10-CM

## 2018-04-15 DIAGNOSIS — E2839 Other primary ovarian failure: Secondary | ICD-10-CM | POA: Diagnosis not present

## 2018-04-15 DIAGNOSIS — Z0000 Encounter for general adult medical examination without abnormal findings: Secondary | ICD-10-CM | POA: Diagnosis not present

## 2018-04-15 DIAGNOSIS — I10 Essential (primary) hypertension: Secondary | ICD-10-CM | POA: Diagnosis not present

## 2018-04-15 DIAGNOSIS — D75839 Thrombocytosis, unspecified: Secondary | ICD-10-CM

## 2018-04-15 DIAGNOSIS — R7989 Other specified abnormal findings of blood chemistry: Secondary | ICD-10-CM | POA: Diagnosis not present

## 2018-04-15 DIAGNOSIS — E781 Pure hyperglyceridemia: Secondary | ICD-10-CM | POA: Diagnosis not present

## 2018-04-15 DIAGNOSIS — R7303 Prediabetes: Secondary | ICD-10-CM

## 2018-04-15 DIAGNOSIS — D649 Anemia, unspecified: Secondary | ICD-10-CM | POA: Insufficient documentation

## 2018-04-15 MED ORDER — MONTELUKAST SODIUM 10 MG PO TABS: 10.0000 mg | ORAL_TABLET | Freq: Every day | ORAL | 3 refills | Status: DC

## 2018-04-15 MED ORDER — SUMATRIPTAN SUCCINATE 100 MG PO TABS: ORAL_TABLET | ORAL | 3 refills | Status: AC

## 2018-04-15 MED ORDER — LIOTHYRONINE SODIUM 25 MCG PO TABS: 12.5000 ug | ORAL_TABLET | Freq: Every evening | ORAL | 3 refills | Status: DC

## 2018-04-15 MED ORDER — IRBESARTAN-HYDROCHLOROTHIAZIDE 150-12.5 MG PO TABS: 1.0000 | ORAL_TABLET | ORAL | 0 refills | Status: DC

## 2018-04-15 MED ORDER — PROPRANOLOL HCL ER 120 MG PO CP24: 120.0000 mg | ORAL_CAPSULE | Freq: Every day | ORAL | 3 refills | Status: DC

## 2018-04-15 MED ORDER — SYNTHROID 125 MCG PO TABS: 125.0000 ug | ORAL_TABLET | Freq: Every morning | ORAL | 3 refills | Status: DC

## 2018-04-15 MED ORDER — POTASSIUM CITRATE ER 15 MEQ (1620 MG) PO TBCR: 1.0000 | EXTENDED_RELEASE_TABLET | Freq: Three times a day (TID) | ORAL | 3 refills | Status: DC

## 2018-04-15 MED ORDER — MIRABEGRON ER 50 MG PO TB24: 50.0000 mg | ORAL_TABLET | Freq: Every day | ORAL | 3 refills | Status: DC

## 2018-04-15 MED ORDER — ACYCLOVIR 200 MG PO CAPS: ORAL_CAPSULE | ORAL | 3 refills | Status: DC

## 2018-04-15 NOTE — Progress Notes (Signed)
Subjective:    Patient ID: [name], female    DOB: 12/11/1950, 67 y.o.   MRN: [id number]  HPI  Here for health maintenance exam and to review chronic medical problems    Doing well and feeling good    Wt Readings from Last 3 Encounters:  04/15/18 181 lb (82.1 kg)  04/08/18 180 lb 8 oz (81.9 kg)  02/24/18 180 lb 8 oz (81.9 kg)  diet- good most of the time  Exercise - gets put on back burner with bladder issues  Now retired/ likes to walk  29.66 kg/m   amw was 5/1 Addressed colon cancer screening  No gaps or concerns  Colonoscopy 2/08 with 10 y recall  No stool change or blood in stool   Saw urology on Monday - Dr Matilde Sprang  Thought she may have passed a kidney stone  Not ready for a bladder tack yet    Mammogram 6/18 - she just got her letter and will get it scheduled herself  Self breast exam -no lumps   dexa 1/13 bmd in normal range  Is interested in screening  Walking for exercise  Taking D  No falls or fractures   zostavax 4/13  Thyroid status  No clinical changes  Lab Results  Component Value Date   TSH 2.77 04/08/2018   used to see Dr Jerilynn Mages for endocrinology  Cytomel 12.5 mg daily  levothy 125 mcg   bp is stable today  No cp or palpitations or headaches or edema  No side effects to medicines  BP Readings from Last 3 Encounters:  04/15/18 120/82  04/08/18 110/82  02/24/18 106/62     Lab Results  Component Value Date   CREATININE 1.35 (H) 04/08/2018   BUN 28 (H) 04/08/2018   NA 137 04/08/2018   K 4.3 04/08/2018   CL 102 04/08/2018   CO2 25 04/08/2018  cr is up significantly Had ? Renal stone and also uti in march  Drinks 10-12 standard 12 oz bottles of water a day  Still on myrbetriq - only helps a little   Lab Results  Component Value Date   ALT 14 04/08/2018   AST 19 04/08/2018   ALKPHOS 50 04/08/2018   BILITOT 0.5 04/08/2018     Hx of thromobycytosis  Lab Results  Component Value Date   WBC 8.8 04/08/2018   HGB 11.9  (L) 04/08/2018   HCT 35.4 (L) 04/08/2018   MCV 87.1 04/08/2018   PLT 511.0 (H) 04/08/2018   platelet count is down from 558  Also new anemia - last Hb 14.2  Did have a fair amt of blood in urine - that may be the cause/renal stone   H/o high triglycerides Lab Results  Component Value Date   CHOL 179 04/08/2018   CHOL 198 04/04/2017   CHOL 209 (H) 03/29/2016   Lab Results  Component Value Date   HDL 67.80 04/08/2018   HDL 79.80 04/04/2017   HDL 72.20 03/29/2016   Lab Results  Component Value Date   LDLCALC 78 04/04/2017   LDLCALC 88 03/20/2015   LDLCALC 108 (H) 03/04/2014   Lab Results  Component Value Date   TRIG 237.0 (H) 04/08/2018   TRIG 200.0 (H) 04/04/2017   TRIG 207.0 (H) 03/29/2016   Lab Results  Component Value Date   CHOLHDL 3 04/08/2018   CHOLHDL 2 04/04/2017   CHOLHDL 3 03/29/2016   Lab Results  Component Value Date   LDLDIRECT 90.0 04/08/2018  LDLDIRECT 112.0 03/29/2016   LDLDIRECT 123.4 06/21/2013   On femofibrate  Controlled fairly   Hyperglycemia Lab Results  Component Value Date   HGBA1C 5.9 04/08/2018  up from 5.6  Watches sugar/carbs Ate more bread when she was sick    Patient Active Problem List   Diagnosis Date Noted  . Estrogen deficiency 04/15/2018  . Mild anemia 04/15/2018  . Bladder prolapse, female, 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  . Dyspepsia 04/14/2017  . Colon cancer screening 04/09/2017  . Prediabetes 03/30/2017  . Medicare annual wellness visit, initial 04/04/2016  . Other screening mammogram 12/31/2011  . Post-menopausal 12/31/2011  . Routine general medical examination at a health care facility 12/23/2011  . 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  . Hypothyroidism 11/08/2008  . FEVER BLISTER 09/23/2007  . Hypertriglyceridemia 09/23/2007  . Essential hypertension 09/23/2007  . ALLERGIC RHINITIS 09/23/2007  . ASTHMA 09/23/2007  . HYPOKALEMIA 04/16/2007  . LEUKOCYTOSIS NOS 04/09/2007  . Thrombocytosis ([place])  04/09/2007   Past Medical History:  Diagnosis Date  . Allergic rhinitis   . Anemia, unspecified    b12 def.  . Asthma    controlled w/ singulair  . Herpes simplex without mention of complication    controlled w/ meds (no current fever blisters)  . History of kidney stones   . HLD (hyperlipidemia)    borderline-  no meds  . HTN (hypertension)    echo- 2006  . Hyperthyroidism hx ptu 2006   takes synthroid  . Leukocytosis, unspecified hx of 10 yrs ago   no problem since  . Migraine   . Mixed incontinence    urge and stress incontinence  . Renal cyst right   . Ureteral calculi right    s/p laser  litho w/ stone extraction 01-28-11   Past Surgical History:  Procedure Laterality Date  . BREAST REDUCTION SURGERY  1994  . CERVICAL FUSION  2000   fusion c4-6  . CERVICAL FUSION  2005   fusion c6-7 and plate removal Q4-6  . CYSTOSCOPY W/ RETROGRADES  10/21/2011   Procedure: CYSTOSCOPY WITH RETROGRADE PYELOGRAM;  Surgeon: Ailene Rud, MD;  Location: [facility];  Service: Urology;  Laterality: Right;  CYSTOSCOPY RIGHT RETROGRADE, PYELOGRAM  URETEROSCOPY WITH HOLMIUM LASER AND STONE EXTRACTION  . CYSTOSCOPY/RETROGRADE/URETEROSCOPY/STONE EXTRACTION WITH BASKET  01-28-11   right   . REDUCTION MAMMAPLASTY Bilateral 1995  . TONSILLECTOMY  1960  . VAGINAL HYSTERECTOMY  1987   fibroids/anemia   Social History   Tobacco Use  . Smoking status: Never Smoker  . Smokeless tobacco: Never Used  Substance Use Topics  . Alcohol use: No    Alcohol/week: 0.0 oz  . Drug use: No   Family History  Problem Relation Age of Onset  . Heart failure Father   . Osteoporosis Mother   . Diabetes Mother   . Diabetes Unknown        Grandfather  . Coronary artery disease Unknown        Grandmother  . Breast cancer Neg Hx    Allergies  Allergen Reactions  . Contrast Media [Iodinated Diagnostic Agents] Anaphylaxis  . Shellfish-Derived Products Anaphylaxis  . Septra  [Sulfamethoxazole-Trimethoprim]     Nausea    Current Outpatient Medications on File Prior to Visit  Medication Sig Dispense Refill  . calcium-vitamin D (OSCAL WITH D) 500-200 MG-UNIT per tablet Take 2 tablets by mouth daily.     . fenofibrate 160 MG tablet TAKE 1 TABLET BY MOUTH  DAILY 90 tablet 1  .  Multiple Vitamin (MULTIVITAMIN) tablet Take 1 tablet by mouth daily.     . vitamin B-12 (CYANOCOBALAMIN) 500 MCG tablet Take 500 mcg by mouth daily.       No current facility-administered medications on file prior to visit.     Review of Systems  Constitutional: Positive for fatigue. Negative for activity change, appetite change, fever and unexpected weight change.  HENT: Negative for congestion, ear pain, rhinorrhea, sinus pressure and sore throat.   Eyes: Negative for pain, redness and visual disturbance.  Respiratory: Negative for cough, shortness of breath and wheezing.   Cardiovascular: Negative for chest pain and palpitations.  Gastrointestinal: Negative for abdominal pain, blood in stool, constipation and diarrhea.  Endocrine: Negative for polydipsia and polyuria.  Genitourinary: Positive for frequency. Negative for dysuria and urgency.       Frequency and incontinence/overactive bladder  Hematuria is resolved   Musculoskeletal: Negative for arthralgias, back pain and myalgias.  Skin: Negative for pallor and rash.  Allergic/Immunologic: Negative for environmental allergies.  Neurological: Negative for dizziness, syncope and headaches.  Hematological: Negative for adenopathy. Does not bruise/bleed easily.  Psychiatric/Behavioral: Negative for decreased concentration and dysphoric mood. The patient is not nervous/anxious.        Objective:   Physical Exam  Constitutional: She appears well-developed and well-nourished. No distress.  overwt and well app  HENT:  Head: Normocephalic and atraumatic.  Right Ear: External ear normal.  Left Ear: External ear normal.  Mouth/Throat:  Oropharynx is clear and moist.  Eyes: Pupils are equal, round, and reactive to light. Conjunctivae and EOM are normal. No scleral icterus.  Neck: Normal range of motion. Neck supple. No JVD present. Carotid bruit is not present. No thyromegaly present.  Cardiovascular: Normal rate, regular rhythm, normal heart sounds and intact distal pulses. Exam reveals no gallop.  Pulmonary/Chest: Effort normal and breath sounds normal. No respiratory distress. She has no wheezes. She exhibits no tenderness. No breast tenderness, discharge or bleeding.  Abdominal: Soft. Bowel sounds are normal. She exhibits no distension, no abdominal bruit and no mass. There is no tenderness.  Genitourinary: No breast tenderness, discharge or bleeding.  Genitourinary Comments: Breast exam: No mass, nodules, thickening, tenderness, bulging, retraction, inflamation, nipple discharge or skin changes noted.  No axillary or clavicular LA.      Musculoskeletal: Normal range of motion. She exhibits no edema or tenderness.  Lymphadenopathy:    She has no cervical adenopathy.  Neurological: She is alert. She has normal reflexes. No cranial nerve deficit. She exhibits normal muscle tone. Coordination normal.  Skin: Skin is warm and dry. No rash noted. No erythema. No pallor.  Solar lentigines diffusely Few sks   Psychiatric: She has a normal mood and affect.  pleasant          Assessment & Plan:   Problem List Items Addressed This Visit      Cardiovascular and Mediastinum   Essential hypertension    bp in fair control at this time  BP Readings from Last 1 Encounters:  04/15/18 120/82   No changes needed Disc lifstyle change with low sodium diet and exercise  Labs reviewed       Relevant Medications   irbesartan-hydrochlorothiazide (AVALIDE) 150-12.5 MG tablet   propranolol ER (INDERAL LA) 120 MG 24 hr capsule     Endocrine   Hypothyroidism    Hypothyroidism  Pt has no clinical changes No change in energy  level/ hair or skin/ edema and no tremor Lab Results  Component Value Date  TSH 2.77 04/08/2018     Refill cytomel and levothy       Relevant Medications   liothyronine (CYTOMEL) 25 MCG tablet   propranolol ER (INDERAL LA) 120 MG 24 hr capsule   SYNTHROID 125 MCG tablet     Hematopoietic and Hemostatic   Thrombocytosis (HCC)    Platelets ct is improved at 511 Mild anemia  Due for colonoscopy  No bleeding or clotting issues  Continue to follow        Other   Colon cancer screening    Due for recall colonoscopy  Ref done Mild anemia as well (this could be from recent hematuria however)      Relevant Orders   Ambulatory referral to Gastroenterology   Elevated serum creatinine    1.35 Recent renal stone (suspected) -seeing urology  Re check 1 mo with good hydration  Avoid nephrotoxins  Pt is on arb       Relevant Orders   Renal function panel   Estrogen deficiency    Ref for screening dexa       Relevant Orders   DG Bone Density   Hypertriglyceridemia    Fairly stable with femofibrate Disc goals for lipids and reasons to control them Rev last labs with pt Rev low sat fat diet in detail Urged pt to watch carbs as well       Relevant Medications   irbesartan-hydrochlorothiazide (AVALIDE) 150-12.5 MG tablet   propranolol ER (INDERAL LA) 120 MG 24 hr capsule   Mild anemia    Most likely due to recent hematuria (suspected kidney stone)  Due for colonoscopy as well- ref done      Prediabetes    Lab Results  Component Value Date   HGBA1C 5.9 04/08/2018   disc imp of low glycemic diet and wt loss to prevent DM2       Routine general medical examination at a health care facility - Primary    Reviewed health habits including diet and exercise and skin cancer prevention Reviewed appropriate screening tests for age  Also reviewed health mt list, fam hx and immunization status , as well as social and family history   See HPI Labs rev  amw rev  Ref for  dexa Ref for colonoscopy  Inc water intake and re check renal panel 1 mo  She will schedule her own mammogram

## 2018-04-15 NOTE — Patient Instructions (Addendum)
Get at least 64 oz per day of water  We will need to re check kidney labs   Don't forget your mammogram appointment   Try to get most of your carbohydrates from produce (with the exception of white potatoes)  Eat less bread/pasta/rice/snack foods/cereals/sweets and other items from the middle of the grocery store (processed carbs)  We will refer for colonoscopy and bone density test

## 2018-04-16 ENCOUNTER — Other Ambulatory Visit: Payer: Self-pay | Admitting: Family Medicine

## 2018-04-16 DIAGNOSIS — Z1231 Encounter for screening mammogram for malignant neoplasm of breast: Secondary | ICD-10-CM

## 2018-04-17 DIAGNOSIS — H524 Presbyopia: Secondary | ICD-10-CM | POA: Diagnosis not present

## 2018-04-19 DIAGNOSIS — R7989 Other specified abnormal findings of blood chemistry: Secondary | ICD-10-CM | POA: Insufficient documentation

## 2018-04-19 NOTE — Assessment & Plan Note (Signed)
Ref for screening dexa  

## 2018-04-19 NOTE — Assessment & Plan Note (Signed)
Most likely due to recent hematuria (suspected kidney stone)  Due for colonoscopy as well- ref done

## 2018-04-19 NOTE — Assessment & Plan Note (Signed)
bp in fair control at this time  BP Readings from Last 1 Encounters:  04/15/18 120/82   No changes needed Disc lifstyle change with low sodium diet and exercise  Labs reviewed

## 2018-04-19 NOTE — Assessment & Plan Note (Signed)
Reviewed health habits including diet and exercise and skin cancer prevention Reviewed appropriate screening tests for age  Also reviewed health mt list, fam hx and immunization status , as well as social and family history   See HPI Labs rev  amw rev  Ref for dexa Ref for colonoscopy  Inc water intake and re check renal panel 1 mo  She will schedule her own mammogram

## 2018-04-19 NOTE — Assessment & Plan Note (Signed)
Hypothyroidism  Pt has no clinical changes No change in energy level/ hair or skin/ edema and no tremor Lab Results  Component Value Date   TSH 2.77 04/08/2018     Refill cytomel and levothy

## 2018-04-19 NOTE — Assessment & Plan Note (Signed)
Platelets ct is improved at 511 Mild anemia  Due for colonoscopy  No bleeding or clotting issues  Continue to follow

## 2018-04-19 NOTE — Assessment & Plan Note (Signed)
Due for recall colonoscopy  Ref done Mild anemia as well (this could be from recent hematuria however)

## 2018-04-19 NOTE — Assessment & Plan Note (Signed)
1.35 Recent renal stone (suspected) -seeing urology  Re check 1 mo with good hydration  Avoid nephrotoxins  Pt is on arb

## 2018-04-19 NOTE — Assessment & Plan Note (Signed)
Lab Results  Component Value Date   HGBA1C 5.9 04/08/2018   disc imp of low glycemic diet and wt loss to prevent DM2

## 2018-04-19 NOTE — Assessment & Plan Note (Signed)
Fairly stable with femofibrate Disc goals for lipids and reasons to control them Rev last labs with pt Rev low sat fat diet in detail Urged pt to watch carbs as well

## 2018-05-06 ENCOUNTER — Ambulatory Visit (AMBULATORY_SURGERY_CENTER): Payer: Self-pay | Admitting: *Deleted

## 2018-05-06 ENCOUNTER — Other Ambulatory Visit: Payer: Self-pay

## 2018-05-06 VITALS — Ht 66.0 in | Wt 179.8 lb

## 2018-05-06 DIAGNOSIS — Z1211 Encounter for screening for malignant neoplasm of colon: Secondary | ICD-10-CM

## 2018-05-06 MED ORDER — NA SULFATE-K SULFATE-MG SULF 17.5-3.13-1.6 GM/177ML PO SOLN
ORAL | 0 refills | Status: DC
Start: 2018-05-06 — End: 2018-05-19

## 2018-05-06 NOTE — Progress Notes (Signed)
No egg or soy allergy  No home oxygen used or hx of sleep apnea  No intubation or anesthesia problems per pt  No diet medications taken  Registered in [place]

## 2018-05-08 ENCOUNTER — Encounter: Payer: Self-pay | Admitting: Gastroenterology

## 2018-05-19 ENCOUNTER — Ambulatory Visit (AMBULATORY_SURGERY_CENTER): Payer: Medicare Other | Admitting: Gastroenterology

## 2018-05-19 ENCOUNTER — Other Ambulatory Visit: Payer: Self-pay

## 2018-05-19 ENCOUNTER — Encounter: Payer: Self-pay | Admitting: Gastroenterology

## 2018-05-19 VITALS — BP 120/77 | HR 87 | Temp 98.0°F | Resp 22 | Ht 66.0 in | Wt 179.0 lb

## 2018-05-19 DIAGNOSIS — Z1211 Encounter for screening for malignant neoplasm of colon: Secondary | ICD-10-CM | POA: Diagnosis present

## 2018-05-19 MED ORDER — SODIUM CHLORIDE 0.9 % IV SOLN: 500.0000 mL | Freq: Once | INTRAVENOUS | Status: DC

## 2018-05-19 NOTE — Op Note (Signed)
[facility] Patient Name: [name] Procedure Date: 05/19/2018 2:00 PM MRN: [id number] Endoscopist: Mauri Pole , MD Age: 67 Referring MD:  Date of Birth: 1951/10/29 Gender: Female Account #: [id number] Procedure:                Colonoscopy Indications:              Screening for colorectal malignant neoplasm, Last                            colonoscopy: 2008 Medicines:                Monitored Anesthesia Care Procedure:                Pre-Anesthesia Assessment:                           - Prior to the procedure, a History and Physical                            was performed, and patient medications and                            allergies were reviewed. The patient's tolerance of                            previous anesthesia was also reviewed. The risks                            and benefits of the procedure and the sedation                            options and risks were discussed with the patient.                            All questions were answered, and informed consent                            was obtained. Prior Anticoagulants: The patient has                            taken no previous anticoagulant or antiplatelet                            agents. ASA Grade Assessment: II - A patient with                            mild systemic disease. After reviewing the risks                            and benefits, the patient was deemed in                            satisfactory condition to undergo the procedure.  After obtaining informed consent, the colonoscope                            was passed under direct vision. Throughout the                            procedure, the patient's blood pressure, pulse, and                            oxygen saturations were monitored continuously. The                            Model PCF-H190DL [id number]) scope was introduced                            through the anus and advanced to  the the cecum,                            identified by appendiceal orifice and ileocecal                            valve. The colonoscopy was performed without                            difficulty. The patient tolerated the procedure                            well. The quality of the bowel preparation was                            excellent. The ileocecal valve, appendiceal                            orifice, and rectum were photographed. Scope In: 2:15:38 PM Scope Out: 2:29:48 PM Scope Withdrawal Time: 0 hours 7 minutes 31 seconds  Total Procedure Duration: 0 hours 14 minutes 10 seconds  Findings:                 The perianal and digital rectal examinations were                            normal.                           A few small-mouthed diverticula were found in the                            sigmoid colon.                           Non-bleeding internal hemorrhoids were found during                            retroflexion. The hemorrhoids were small. Complications:            No immediate complications. Estimated Blood Loss:  Estimated blood loss: none. Impression:               - Diverticulosis in the sigmoid colon.                           - Non-bleeding internal hemorrhoids.                           - No specimens collected. Recommendation:           - Patient has a contact number available for                            emergencies. The signs and symptoms of potential                            delayed complications were discussed with the                            patient. Return to normal activities tomorrow.                            Written discharge instructions were provided to the                            patient.                           - Resume previous diet.                           - Continue present medications.                           - Repeat colonoscopy in 10 years for screening                            purposes. Mauri Pole,  MD 05/19/2018 2:39:10 PM This report has been signed electronically.

## 2018-05-19 NOTE — Patient Instructions (Signed)
  Please read handouts on diverticulosis, hemorrhoids, and hemorrhoid banding.  Repeat colonoscopy in 10 years for screening.  Call GI office to schedule hemorrhoid banding.  YOU HAD AN ENDOSCOPIC PROCEDURE TODAY AT [facility]:   Refer to the procedure report that was given to you for any specific questions about what was found during the examination.  If the procedure report does not answer your questions, please call your gastroenterologist to clarify.  If you requested that your care partner not be given the details of your procedure findings, then the procedure report has been included in a sealed envelope for you to review at your convenience later.  YOU SHOULD EXPECT: Some feelings of bloating in the abdomen. Passage of more gas than usual.  Walking can help get rid of the air that was put into your GI tract during the procedure and reduce the bloating. If you had a lower endoscopy (such as a colonoscopy or flexible sigmoidoscopy) you may notice spotting of blood in your stool or on the toilet paper. If you underwent a bowel prep for your procedure, you may not have a normal bowel movement for a few days.  Please Note:  You might notice some irritation and congestion in your nose or some drainage.  This is from the oxygen used during your procedure.  There is no need for concern and it should clear up in a day or so.  SYMPTOMS TO REPORT IMMEDIATELY:   Following lower endoscopy (colonoscopy or flexible sigmoidoscopy):  Excessive amounts of blood in the stool  Significant tenderness or worsening of abdominal pains  Swelling of the abdomen that is new, acute  Fever of 100F or higher    For urgent or emergent issues, a gastroenterologist can be reached at any hour by calling [phone].   DIET:  We do recommend a small meal at first, but then you may proceed to your regular diet.  Drink plenty of fluids but you should avoid alcoholic beverages for 24  hours.  ACTIVITY:  You should plan to take it easy for the rest of today and you should NOT DRIVE or use heavy machinery until tomorrow (because of the sedation medicines used during the test).    FOLLOW UP: Our staff will call the number listed on your records the next business day following your procedure to check on you and address any questions or concerns that you may have regarding the information given to you following your procedure. If we do not reach you, we will leave a message.  However, if you are feeling well and you are not experiencing any problems, there is no need to return our call.  We will assume that you have returned to your regular daily activities without incident.  If any biopsies were taken you will be contacted by phone or by letter within the next 1-3 weeks.  Please call us at [phone] if you have not heard about the biopsies in 3 weeks.    SIGNATURES/CONFIDENTIALITY: You and/or your care partner have signed paperwork which will be entered into your electronic medical record.  These signatures attest to the fact that that the information above on your After Visit Summary has been reviewed and is understood.  Full responsibility of the confidentiality of this discharge information lies with you and/or your care-partner.

## 2018-05-19 NOTE — Progress Notes (Signed)
Report to PACU, RN, vss, BBS= Clear.  

## 2018-05-20 ENCOUNTER — Telehealth: Payer: Self-pay | Admitting: *Deleted

## 2018-05-20 NOTE — Telephone Encounter (Signed)
  Follow up Call-  Call back number 05/19/2018  Post procedure Call Back phone  # [phone]  Permission to leave phone message Yes  Some recent data might be hidden     Patient questions:  Do you have a fever, pain , or abdominal swelling? No. Pain Score  0 *  Have you tolerated food without any problems? Yes.    Have you been able to return to your normal activities? Yes.    Do you have any questions about your discharge instructions: Diet   No. Medications  No. Follow up visit  No.  Do you have questions or concerns about your Care? No.  Actions: * If pain score is 4 or above: No action needed, pain <4.

## 2018-05-25 ENCOUNTER — Ambulatory Visit
Admission: RE | Admit: 2018-05-25 | Discharge: 2018-05-25 | Disposition: A | Discharge status: Home / Self Care | Payer: Medicare Other | Source: Ambulatory Visit | Attending: Family Medicine | Admitting: Family Medicine

## 2018-05-25 DIAGNOSIS — M8588 Other specified disorders of bone density and structure, other site: Secondary | ICD-10-CM | POA: Diagnosis not present

## 2018-05-25 DIAGNOSIS — Z1231 Encounter for screening mammogram for malignant neoplasm of breast: Secondary | ICD-10-CM | POA: Diagnosis not present

## 2018-05-25 DIAGNOSIS — E2839 Other primary ovarian failure: Secondary | ICD-10-CM

## 2018-05-26 ENCOUNTER — Telehealth: Payer: Self-pay | Admitting: Family Medicine

## 2018-05-26 NOTE — Telephone Encounter (Signed)
Spoke to pt and advised labs to be repeated in 71mo. Lab appt scheduled

## 2018-05-26 NOTE — Telephone Encounter (Signed)
Copied from [place] [id number]. Topic: Quick Communication - See Telephone Encounter >> May 26, 2018  9:23 AM Cleaster Corin, NT wrote: CRM for notification. See Telephone encounter for: 05/26/18.  Pt. Calling to see when she is supposed to have her appt. For renal function panel pt. Can be reached at [phone]

## 2018-05-27 ENCOUNTER — Other Ambulatory Visit (INDEPENDENT_AMBULATORY_CARE_PROVIDER_SITE_OTHER): Payer: Medicare Other

## 2018-05-27 DIAGNOSIS — R7989 Other specified abnormal findings of blood chemistry: Secondary | ICD-10-CM | POA: Diagnosis not present

## 2018-05-27 LAB — RENAL FUNCTION PANEL
ALBUMIN: 4.2 g/dL (ref 3.5–5.2)
BUN: 23 mg/dL (ref 6–23)
CHLORIDE: 103 meq/L (ref 96–112)
CO2: 25 mEq/L (ref 19–32)
Calcium: 9.7 mg/dL (ref 8.4–10.5)
Creatinine, Ser: 1.32 mg/dL — ABNORMAL HIGH (ref 0.40–1.20)
GFR: 42.6 mL/min — ABNORMAL LOW (ref >60.00)
Glucose, Bld: 102 mg/dL — ABNORMAL HIGH (ref 70–99)
PHOSPHORUS: 3.2 mg/dL (ref 2.3–4.6)
POTASSIUM: 4.3 meq/L (ref 3.5–5.1)
SODIUM: 138 meq/L (ref 135–145)

## 2018-06-01 ENCOUNTER — Telehealth: Payer: Self-pay

## 2018-06-01 NOTE — Telephone Encounter (Signed)
Copied from [place] [id number]. Topic: General - Other >> Jun 01, 2018  9:52 AM [name] R wrote: Pt is calling wanting to know if she needs to be on more antibiotics due to lab results.  Callback [phone]

## 2018-06-01 NOTE — Telephone Encounter (Signed)
See 05-27-18 result notes.

## 2018-06-02 ENCOUNTER — Other Ambulatory Visit: Payer: Medicare Other

## 2018-06-02 DIAGNOSIS — R32 Unspecified urinary incontinence: Secondary | ICD-10-CM

## 2018-06-02 DIAGNOSIS — R7989 Other specified abnormal findings of blood chemistry: Secondary | ICD-10-CM

## 2018-06-02 NOTE — Telephone Encounter (Signed)
Pt notified of Dr. Marliss Coots comments and she is going to stop by today to leave a urine sample for cx

## 2018-06-02 NOTE — Telephone Encounter (Signed)
I would like to get a urine culture to make sure there is no more infection  Do keep drinking fluids

## 2018-06-03 ENCOUNTER — Other Ambulatory Visit: Payer: Self-pay | Admitting: Family Medicine

## 2018-06-04 ENCOUNTER — Telehealth: Payer: Self-pay | Admitting: *Deleted

## 2018-06-04 LAB — URINE CULTURE
MICRO NUMBER:: 90757648
SPECIMEN QUALITY:: ADEQUATE

## 2018-06-04 MED ORDER — CEPHALEXIN 250 MG PO CAPS: 250.0000 mg | ORAL_CAPSULE | Freq: Two times a day (BID) | ORAL | 0 refills | Status: AC

## 2018-06-04 NOTE — Telephone Encounter (Signed)
-----   Message from Abner Greenspan, MD sent at 06/04/2018 10:38 AM EDT ----- Please call pt to send in keflex 250 mg 1 po bid for 7 d #14 no refills  Pharm of choice Alert [place] if no imp in symptoms

## 2018-06-04 NOTE — Telephone Encounter (Signed)
Rx sent and pt notified of cx results and Dr. Marliss Coots comments

## 2018-08-19 ENCOUNTER — Other Ambulatory Visit: Payer: Self-pay | Admitting: Family Medicine

## 2018-10-14 ENCOUNTER — Emergency Department (HOSPITAL_COMMUNITY): Payer: Medicare Other

## 2018-10-14 ENCOUNTER — Encounter (HOSPITAL_COMMUNITY): Payer: Self-pay

## 2018-10-14 ENCOUNTER — Inpatient Hospital Stay (HOSPITAL_COMMUNITY)
Admission: EM | Admit: 2018-10-14 | Discharge: 2018-10-17 | DRG: 872 | Disposition: A | Discharge status: Home / Self Care | Payer: Medicare Other | Attending: Internal Medicine | Admitting: Internal Medicine

## 2018-10-14 ENCOUNTER — Other Ambulatory Visit: Payer: Self-pay

## 2018-10-14 ENCOUNTER — Telehealth: Payer: Self-pay

## 2018-10-14 DIAGNOSIS — Z8262 Family history of osteoporosis: Secondary | ICD-10-CM | POA: Diagnosis not present

## 2018-10-14 DIAGNOSIS — Z8249 Family history of ischemic heart disease and other diseases of the circulatory system: Secondary | ICD-10-CM

## 2018-10-14 DIAGNOSIS — R1319 Other dysphagia: Secondary | ICD-10-CM

## 2018-10-14 DIAGNOSIS — D631 Anemia in chronic kidney disease: Secondary | ICD-10-CM | POA: Diagnosis present

## 2018-10-14 DIAGNOSIS — T83511A Infection and inflammatory reaction due to indwelling urethral catheter, initial encounter: Secondary | ICD-10-CM

## 2018-10-14 DIAGNOSIS — D539 Nutritional anemia, unspecified: Secondary | ICD-10-CM | POA: Diagnosis present

## 2018-10-14 DIAGNOSIS — I129 Hypertensive chronic kidney disease with stage 1 through stage 4 chronic kidney disease, or unspecified chronic kidney disease: Secondary | ICD-10-CM | POA: Diagnosis present

## 2018-10-14 DIAGNOSIS — Z981 Arthrodesis status: Secondary | ICD-10-CM | POA: Diagnosis not present

## 2018-10-14 DIAGNOSIS — Z91041 Radiographic dye allergy status: Secondary | ICD-10-CM

## 2018-10-14 DIAGNOSIS — Z882 Allergy status to sulfonamides status: Secondary | ICD-10-CM

## 2018-10-14 DIAGNOSIS — R6510 Systemic inflammatory response syndrome (SIRS) of non-infectious origin without acute organ dysfunction: Secondary | ICD-10-CM | POA: Diagnosis not present

## 2018-10-14 DIAGNOSIS — D473 Essential (hemorrhagic) thrombocythemia: Secondary | ICD-10-CM | POA: Diagnosis not present

## 2018-10-14 DIAGNOSIS — B349 Viral infection, unspecified: Secondary | ICD-10-CM | POA: Diagnosis not present

## 2018-10-14 DIAGNOSIS — Z90710 Acquired absence of both cervix and uterus: Secondary | ICD-10-CM | POA: Diagnosis not present

## 2018-10-14 DIAGNOSIS — N183 Chronic kidney disease, stage 3 (moderate): Secondary | ICD-10-CM | POA: Diagnosis not present

## 2018-10-14 DIAGNOSIS — E039 Hypothyroidism, unspecified: Secondary | ICD-10-CM | POA: Diagnosis present

## 2018-10-14 DIAGNOSIS — Z79890 Hormone replacement therapy (postmenopausal): Secondary | ICD-10-CM

## 2018-10-14 DIAGNOSIS — Z91013 Allergy to seafood: Secondary | ICD-10-CM | POA: Diagnosis not present

## 2018-10-14 DIAGNOSIS — Z87442 Personal history of urinary calculi: Secondary | ICD-10-CM | POA: Diagnosis not present

## 2018-10-14 DIAGNOSIS — Z86010 Personal history of colonic polyps: Secondary | ICD-10-CM

## 2018-10-14 DIAGNOSIS — K221 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: Secondary | ICD-10-CM | POA: Diagnosis not present

## 2018-10-14 DIAGNOSIS — J45909 Unspecified asthma, uncomplicated: Secondary | ICD-10-CM | POA: Diagnosis not present

## 2018-10-14 DIAGNOSIS — A419 Sepsis, unspecified organism: Principal | ICD-10-CM | POA: Diagnosis present

## 2018-10-14 DIAGNOSIS — R6520 Severe sepsis without septic shock: Secondary | ICD-10-CM | POA: Diagnosis not present

## 2018-10-14 DIAGNOSIS — N179 Acute kidney failure, unspecified: Secondary | ICD-10-CM | POA: Diagnosis present

## 2018-10-14 DIAGNOSIS — B9620 Unspecified Escherichia coli [E. coli] as the cause of diseases classified elsewhere: Secondary | ICD-10-CM | POA: Diagnosis present

## 2018-10-14 DIAGNOSIS — Z87440 Personal history of urinary (tract) infections: Secondary | ICD-10-CM

## 2018-10-14 DIAGNOSIS — F329 Major depressive disorder, single episode, unspecified: Secondary | ICD-10-CM | POA: Diagnosis present

## 2018-10-14 DIAGNOSIS — R630 Anorexia: Secondary | ICD-10-CM | POA: Diagnosis present

## 2018-10-14 DIAGNOSIS — Z79899 Other long term (current) drug therapy: Secondary | ICD-10-CM

## 2018-10-14 DIAGNOSIS — N390 Urinary tract infection, site not specified: Secondary | ICD-10-CM | POA: Diagnosis present

## 2018-10-14 DIAGNOSIS — Z1211 Encounter for screening for malignant neoplasm of colon: Secondary | ICD-10-CM

## 2018-10-14 DIAGNOSIS — E871 Hypo-osmolality and hyponatremia: Secondary | ICD-10-CM | POA: Diagnosis present

## 2018-10-14 DIAGNOSIS — K209 Esophagitis, unspecified: Secondary | ICD-10-CM | POA: Diagnosis not present

## 2018-10-14 DIAGNOSIS — E785 Hyperlipidemia, unspecified: Secondary | ICD-10-CM | POA: Diagnosis present

## 2018-10-14 DIAGNOSIS — R1310 Dysphagia, unspecified: Secondary | ICD-10-CM | POA: Diagnosis not present

## 2018-10-14 DIAGNOSIS — E876 Hypokalemia: Secondary | ICD-10-CM | POA: Diagnosis not present

## 2018-10-14 DIAGNOSIS — Z833 Family history of diabetes mellitus: Secondary | ICD-10-CM

## 2018-10-14 DIAGNOSIS — E538 Deficiency of other specified B group vitamins: Secondary | ICD-10-CM | POA: Diagnosis present

## 2018-10-14 DIAGNOSIS — R000 Tachycardia, unspecified: Secondary | ICD-10-CM | POA: Diagnosis not present

## 2018-10-14 HISTORY — DX: Anemia, unspecified: D64.9

## 2018-10-14 LAB — COMPREHENSIVE METABOLIC PANEL
ALBUMIN: 2.8 g/dL — AB (ref 3.5–5.0)
ALT: 25 U/L (ref 0–44)
AST: 18 U/L (ref 15–41)
Alkaline Phosphatase: 121 U/L (ref 38–126)
Anion gap: 17 — ABNORMAL HIGH (ref 5–15)
BUN: 74 mg/dL — ABNORMAL HIGH (ref 8–23)
CHLORIDE: 93 mmol/L — AB (ref 98–111)
CO2: 18 mmol/L — AB (ref 22–32)
Calcium: 9.5 mg/dL (ref 8.9–10.3)
Creatinine, Ser: 2.15 mg/dL — ABNORMAL HIGH (ref 0.44–1.00)
GFR calc Af Amer: 26 mL/min — ABNORMAL LOW (ref >60)
GFR calc non Af Amer: 23 mL/min — ABNORMAL LOW (ref >60)
Glucose, Bld: 123 mg/dL — ABNORMAL HIGH (ref 70–99)
POTASSIUM: 3.3 mmol/L — AB (ref 3.5–5.1)
SODIUM: 128 mmol/L — AB (ref 135–145)
Total Bilirubin: 0.7 mg/dL (ref 0.3–1.2)
Total Protein: 7.2 g/dL (ref 6.5–8.1)

## 2018-10-14 LAB — CBC WITH DIFFERENTIAL/PLATELET
ABS IMMATURE GRANULOCYTES: 0.49 10*3/uL — AB (ref 0.00–0.07)
BASOS ABS: 0 10*3/uL (ref 0.0–0.1)
BASOS PCT: 0 %
EOS PCT: 1 %
Eosinophils Absolute: 0.1 10*3/uL (ref 0.0–0.5)
HCT: 34.7 % — ABNORMAL LOW (ref 36.0–46.0)
Hemoglobin: 10.6 g/dL — ABNORMAL LOW (ref 12.0–15.0)
Immature Granulocytes: 4 %
LYMPHS PCT: 18 %
Lymphs Abs: 2.4 10*3/uL (ref 0.7–4.0)
MCH: 26.8 pg (ref 26.0–34.0)
MCHC: 30.5 g/dL (ref 30.0–36.0)
MCV: 87.6 fL (ref 80.0–100.0)
MONO ABS: 0.8 10*3/uL (ref 0.1–1.0)
Monocytes Relative: 6 %
NEUTROS ABS: 9.4 10*3/uL — AB (ref 1.7–7.7)
Neutrophils Relative %: 71 %
PLATELETS: 1033 10*3/uL — AB (ref 150–400)
RBC: 3.96 MIL/uL (ref 3.87–5.11)
RDW: 13.6 % (ref 11.5–15.5)
WBC: 13.2 10*3/uL — AB (ref 4.0–10.5)
nRBC: 0 % (ref 0.0–0.2)

## 2018-10-14 LAB — URINALYSIS, ROUTINE W REFLEX MICROSCOPIC
Bilirubin Urine: NEGATIVE
Glucose, UA: NEGATIVE mg/dL
Ketones, ur: NEGATIVE mg/dL
Nitrite: NEGATIVE
PH: 6 (ref 5.0–8.0)
Protein, ur: NEGATIVE mg/dL
SPECIFIC GRAVITY, URINE: 1.005 (ref 1.005–1.030)
WBC, UA: >50 WBC/hpf — ABNORMAL HIGH (ref 0–5)

## 2018-10-14 LAB — TSH
TSH: 4.393 u[IU]/mL (ref 0.350–4.500)
TSH: 3.435 u[IU]/mL (ref 0.350–4.500)

## 2018-10-14 LAB — I-STAT CG4 LACTIC ACID, ED: LACTIC ACID, VENOUS: 1.52 mmol/L (ref 0.5–1.9)

## 2018-10-14 LAB — T4, FREE: Free T4: 1.2 ng/dL (ref 0.82–1.77)

## 2018-10-14 MED ORDER — IRBESARTAN-HYDROCHLOROTHIAZIDE 150-12.5 MG PO TABS: 1.0000 | ORAL_TABLET | ORAL | Status: DC

## 2018-10-14 MED ORDER — ONDANSETRON HCL 4 MG PO TABS: 4.0000 mg | ORAL_TABLET | Freq: Four times a day (QID) | ORAL | Status: DC | PRN

## 2018-10-14 MED ORDER — SODIUM CHLORIDE 0.9 % IV SOLN
1.0000 g | INTRAVENOUS | Status: DC
  Administered 2018-10-14 – 2018-10-17 (×4): 1 g via INTRAVENOUS
  Filled 2018-10-14 (×4): qty 10

## 2018-10-14 MED ORDER — SUMATRIPTAN SUCCINATE 100 MG PO TABS
100.0000 mg | ORAL_TABLET | ORAL | Status: DC | PRN
  Administered 2018-10-15: 100 mg via ORAL
  Filled 2018-10-14 (×2): qty 1

## 2018-10-14 MED ORDER — ACETAMINOPHEN 650 MG RE SUPP: 650.0000 mg | Freq: Four times a day (QID) | RECTAL | Status: DC | PRN

## 2018-10-14 MED ORDER — ALBUTEROL SULFATE (2.5 MG/3ML) 0.083% IN NEBU: 2.5000 mg | INHALATION_SOLUTION | RESPIRATORY_TRACT | Status: DC | PRN

## 2018-10-14 MED ORDER — FENOFIBRATE 160 MG PO TABS
160.0000 mg | ORAL_TABLET | Freq: Every day | ORAL | Status: DC
  Administered 2018-10-14 – 2018-10-17 (×2): 160 mg via ORAL
  Filled 2018-10-14 (×4): qty 1

## 2018-10-14 MED ORDER — ACETAMINOPHEN 325 MG PO TABS: 650.0000 mg | ORAL_TABLET | Freq: Four times a day (QID) | ORAL | Status: DC | PRN

## 2018-10-14 MED ORDER — ADULT MULTIVITAMIN W/MINERALS CH
1.0000 | ORAL_TABLET | Freq: Every day | ORAL | Status: DC
  Administered 2018-10-17: 1 via ORAL
  Filled 2018-10-14 (×3): qty 1

## 2018-10-14 MED ORDER — HYDROCODONE-ACETAMINOPHEN 5-325 MG PO TABS: 1.0000 | ORAL_TABLET | ORAL | Status: DC | PRN

## 2018-10-14 MED ORDER — SODIUM CHLORIDE 0.9 % IV BOLUS (SEPSIS)
1000.0000 mL | Freq: Once | INTRAVENOUS | Status: AC
  Administered 2018-10-14: 1000 mL via INTRAVENOUS

## 2018-10-14 MED ORDER — ACYCLOVIR 200 MG PO CAPS: 400.0000 mg | ORAL_CAPSULE | Freq: Every day | ORAL | Status: DC

## 2018-10-14 MED ORDER — PROPRANOLOL HCL ER 120 MG PO CP24
120.0000 mg | ORAL_CAPSULE | Freq: Every day | ORAL | Status: DC
  Administered 2018-10-17: 120 mg via ORAL
  Filled 2018-10-14 (×3): qty 1

## 2018-10-14 MED ORDER — HYDROCHLOROTHIAZIDE 12.5 MG PO CAPS
12.5000 mg | ORAL_CAPSULE | Freq: Every day | ORAL | Status: DC
  Filled 2018-10-14: qty 1

## 2018-10-14 MED ORDER — SODIUM CHLORIDE 0.9 % IV BOLUS (SEPSIS)
500.0000 mL | Freq: Once | INTRAVENOUS | Status: AC
  Administered 2018-10-14: 500 mL via INTRAVENOUS

## 2018-10-14 MED ORDER — IRBESARTAN 75 MG PO TABS
150.0000 mg | ORAL_TABLET | Freq: Every day | ORAL | Status: DC
  Filled 2018-10-14: qty 2

## 2018-10-14 MED ORDER — ONDANSETRON HCL 4 MG/2ML IJ SOLN
4.0000 mg | Freq: Four times a day (QID) | INTRAMUSCULAR | Status: DC | PRN
  Administered 2018-10-15: 4 mg via INTRAVENOUS
  Filled 2018-10-14: qty 2

## 2018-10-14 MED ORDER — HEPARIN SODIUM (PORCINE) 5000 UNIT/ML IJ SOLN
5000.0000 [IU] | Freq: Three times a day (TID) | INTRAMUSCULAR | Status: DC
  Administered 2018-10-14 – 2018-10-15 (×3): 5000 [IU] via SUBCUTANEOUS
  Filled 2018-10-14 (×3): qty 1

## 2018-10-14 MED ORDER — VITAMIN B-12 1000 MCG PO TABS
500.0000 ug | ORAL_TABLET | Freq: Every day | ORAL | Status: DC
  Administered 2018-10-17: 500 ug via ORAL
  Filled 2018-10-14 (×4): qty 1

## 2018-10-14 MED ORDER — CEFTRIAXONE SODIUM 1 G IJ SOLR: 1.0000 g | INTRAMUSCULAR | Status: DC

## 2018-10-14 MED ORDER — LIOTHYRONINE SODIUM 25 MCG PO TABS
12.5000 ug | ORAL_TABLET | Freq: Every day | ORAL | Status: DC
  Administered 2018-10-16 – 2018-10-17 (×2): 12.5 ug via ORAL
  Filled 2018-10-14 (×3): qty 1

## 2018-10-14 MED ORDER — MONTELUKAST SODIUM 10 MG PO TABS
10.0000 mg | ORAL_TABLET | Freq: Every day | ORAL | Status: DC
  Administered 2018-10-17: 10 mg via ORAL
  Filled 2018-10-14 (×3): qty 1

## 2018-10-14 MED ORDER — DEXTROSE-NACL 5-0.45 % IV SOLN
INTRAVENOUS | Status: DC
  Administered 2018-10-14 – 2018-10-15 (×2): via INTRAVENOUS

## 2018-10-14 MED ORDER — MIRABEGRON ER 50 MG PO TB24
50.0000 mg | ORAL_TABLET | Freq: Every day | ORAL | Status: DC
  Administered 2018-10-17: 50 mg via ORAL
  Filled 2018-10-14 (×3): qty 1

## 2018-10-14 MED ORDER — LEVOTHYROXINE SODIUM 125 MCG PO TABS
125.0000 ug | ORAL_TABLET | Freq: Every day | ORAL | Status: DC
  Administered 2018-10-15 – 2018-10-17 (×3): 125 ug via ORAL
  Filled 2018-10-14 (×3): qty 1

## 2018-10-14 MED ORDER — CALCIUM CARBONATE-VITAMIN D 500-200 MG-UNIT PO TABS
2.0000 | ORAL_TABLET | Freq: Every day | ORAL | Status: DC
  Administered 2018-10-17: 2 via ORAL
  Filled 2018-10-14 (×3): qty 2

## 2018-10-14 NOTE — ED Provider Notes (Signed)
[facility] EMERGENCY DEPARTMENT Provider Note   CSN: [id number] Arrival date & time: 10/14/18  1045     History   Chief Complaint Chief Complaint  Patient presents with  . Weakness  . Shortness of Breath    HPI [name] is a 67 y.o. female.  HPI  3 weeks ago had viral infection with cough, congestion Has been seen for it before in the past, and reports after she has viruses she typically does not have appetite however it usually doesn't last this long For the past 2-3 weeks has not been able to eat.  Drinking glucerna since Friday. Otherwise drinking ok.  Appetite has been very low, severe generalized weakness.  Worsening generalized weakness and fatigue Yesterday felt short of breath just going to the bathroom  No cough, no fevers, no chest pain, no dysuria, no nausea or vomiting, no diarrhea, no black or bloody stools Frequent urination, has been chronic bladder prolapse Hx of UTIs  Past Medical History:  Diagnosis Date  . Allergic rhinitis   . Allergy   . Anemia, unspecified    b12 def.  . Asthma    controlled w/ singulair  . Herpes simplex without mention of complication    controlled w/ meds (no current fever blisters)  . History of kidney stones   . HLD (hyperlipidemia)    borderline-  no meds  . HTN (hypertension)    echo- 2006  . Hyperthyroidism hx ptu 2006   takes synthroid; had Graves disease, was treated for that- now thyroid doesn't work  . Leukocytosis, unspecified hx of 10 yrs ago   no problem since  . Migraine   . Mixed incontinence    urge and stress incontinence  . Neuromuscular disorder ([place])    hiatal hernia  . Renal cyst right    pt is unsure of this, has had kidney stones  . Ureteral calculi right    s/p laser  litho w/ stone extraction 01-28-11    Patient Active Problem List   Diagnosis Date Noted  . Sepsis ([place]) 10/14/2018  . Elevated serum creatinine 04/19/2018  . Estrogen deficiency 04/15/2018  . Mild  anemia 04/15/2018  . Bladder prolapse, female, 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  . Dyspepsia 04/14/2017  . Colon cancer screening 04/09/2017  . Prediabetes 03/30/2017  . Medicare annual wellness visit, initial 04/04/2016  . Other screening mammogram 12/31/2011  . Post-menopausal 12/31/2011  . Routine general medical examination at a health care facility 12/23/2011  . 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  . Hypothyroidism 11/08/2008  . FEVER BLISTER 09/23/2007  . Hypertriglyceridemia 09/23/2007  . Essential hypertension 09/23/2007  . ALLERGIC RHINITIS 09/23/2007  . ASTHMA 09/23/2007  . HYPOKALEMIA 04/16/2007  . LEUKOCYTOSIS NOS 04/09/2007  . Thrombocytosis ([place]) 04/09/2007    Past Surgical History:  Procedure Laterality Date  . BREAST REDUCTION SURGERY  1994  . CERVICAL FUSION  2000   fusion c4-6  . CERVICAL FUSION  2005   fusion c6-7 and plate removal W2-3  . COLONOSCOPY    . CYSTOSCOPY W/ RETROGRADES  10/21/2011   Procedure: CYSTOSCOPY WITH RETROGRADE PYELOGRAM;  Surgeon: Ailene Rud, MD;  Location[facility];  Service: Urology;  Laterality: Right;  CYSTOSCOPY RIGHT RETROGRADE, PYELOGRAM  URETEROSCOPY WITH HOLMIUM LASER AND STONE EXTRACTION  . CYSTOSCOPY/RETROGRADE/URETEROSCOPY/STONE EXTRACTION WITH BASKET  01-28-11   right   . REDUCTION MAMMAPLASTY Bilateral 1995  . TONSILLECTOMY  1960  . VAGINAL HYSTERECTOMY  1987   fibroids/anemia  OB History   None      Home Medications    Prior to Admission medications   Medication Sig Start Date End Date Taking? Authorizing Provider  acyclovir (ZOVIRAX) 200 MG capsule TAKE 2 CAPSULES DAILY Patient taking differently: Take 400 mg by mouth daily.  04/15/18  Yes Tower, Wynelle Fanny, MD  calcium-vitamin D (OSCAL WITH D) 500-200 MG-UNIT per tablet Take 2 tablets by mouth daily.    Yes [provider]  fenofibrate 160 MG tablet TAKE 1 TABLET BY MOUTH  DAILY Patient taking differently: Take 160 mg by  mouth daily.  06/03/18  Yes Tower, Wynelle Fanny, MD  irbesartan-hydrochlorothiazide (AVALIDE) 150-12.5 MG tablet TAKE 1 TABLET BY MOUTH  EVERY MORNING 08/19/18  Yes Tower, Wynelle Fanny, MD  liothyronine (CYTOMEL) 25 MCG tablet Take 0.5 tablets (12.5 mcg total) by mouth every evening. Needs generic Patient taking differently: Take 12.5 mcg by mouth daily. Needs generic 04/15/18  Yes Tower, Wynelle Fanny, MD  mirabegron ER (MYRBETRIQ) 50 MG TB24 tablet Take 1 tablet (50 mg total) by mouth daily. 04/15/18  Yes Tower, Wynelle Fanny, MD  montelukast (SINGULAIR) 10 MG tablet Take 1 tablet (10 mg total) by mouth daily. 04/15/18  Yes Tower, Wynelle Fanny, MD  Multiple Vitamin (MULTIVITAMIN) tablet Take 1 tablet by mouth daily.    Yes [provider]  propranolol ER (INDERAL LA) 120 MG 24 hr capsule Take 1 capsule (120 mg total) by mouth daily. 04/15/18  Yes Tower, Wynelle Fanny, MD  SUMAtriptan (IMITREX) 100 MG tablet TAKE 1 TABLET FOR HEADACHE MAY REPEAT ONCE IN 2 HOURS IF NEEDED. MAXIMUM OF 2    TABLETS IN ONE DAY. Patient taking differently: Take 100 mg by mouth as needed for migraine or headache. TAKE 100 mg TABLET FOR HEADACHE MAY REPEAT ONCE IN 2 HOURS IF NEEDED. MAXIMUM OF 200 mg    TABLETS IN ONE DAY. 04/15/18  Yes Tower, Wynelle Fanny, MD  SYNTHROID 125 MCG tablet Take 1 tablet (125 mcg total) by mouth every morning. 04/15/18  Yes Tower, Wynelle Fanny, MD  vitamin B-12 (CYANOCOBALAMIN) 500 MCG tablet Take 500 mcg by mouth daily.     Yes [provider]    Family History Family History  Problem Relation Age of Onset  . Heart failure Father   . Osteoporosis Mother   . Diabetes Mother   . Diabetes Unknown        Grandfather  . Coronary artery disease Unknown        Grandmother  . Breast cancer Neg Hx   . Colon cancer Neg Hx   . Esophageal cancer Neg Hx   . Stomach cancer Neg Hx   . Rectal cancer Neg Hx     Social History Social History   Tobacco Use  . Smoking status: Never Smoker  . Smokeless tobacco: Never Used    Substance Use Topics  . Alcohol use: No    Alcohol/week: 0.0 standard drinks  . Drug use: No     Allergies   Contrast media [iodinated diagnostic agents]; Shellfish-derived products; and Septra [sulfamethoxazole-trimethoprim]   Review of Systems Review of Systems  Constitutional: Positive for appetite change and fatigue. Negative for fever.  HENT: Negative for sore throat.   Eyes: Negative for visual disturbance.  Respiratory: Positive for shortness of breath. Negative for cough.   Cardiovascular: Negative for chest pain.  Gastrointestinal: Negative for abdominal pain, blood in stool, diarrhea, nausea and vomiting.  Genitourinary: Positive for frequency. Negative for difficulty urinating.  Musculoskeletal: Negative for back pain and neck pain.  Skin: Negative for rash.  Neurological: Positive for light-headedness. Negative for syncope and headaches.     Physical Exam ED Triage Vitals  Enc Vitals Group     BP 10/14/18 1106 94/76     Pulse Rate 10/14/18 1106 (!) 118     Resp 10/14/18 1106 (!) 25     Temp 10/14/18 1106 97.9 F (36.6 C)     Temp Source 10/14/18 1106 Oral     SpO2 10/14/18 1106 97 %     Weight 10/14/18 2000 177 lb 11.1 oz (80.6 kg)     Height --      Head Circumference --      Peak Flow --      Pain Score 10/14/18 1111 0     Pain Loc --      Pain Edu? --      Excl. in [place]? --      Physical Exam  Constitutional: She is oriented to person, place, and time. She appears well-developed and well-nourished. She appears ill. No distress.  HENT:  Head: Normocephalic and atraumatic.  Eyes: Conjunctivae and EOM are normal.  Neck: Normal range of motion.  Cardiovascular: Regular rhythm, normal heart sounds and intact distal pulses. Tachycardia present. Exam reveals no gallop and no friction rub.  No murmur heard. Pulmonary/Chest: Effort normal and breath sounds normal. No respiratory distress. She has no wheezes. She has no rales.  Abdominal: Soft. She exhibits  no distension. There is no tenderness. There is no guarding.  Musculoskeletal: She exhibits no edema or tenderness.  Neurological: She is alert and oriented to person, place, and time.  Skin: Skin is warm and dry. No rash noted. She is not diaphoretic. No erythema.  Nursing note and vitals reviewed.    ED Treatments / Results  Labs (all labs ordered are listed, but only abnormal results are displayed) Labs Reviewed  COMPREHENSIVE METABOLIC PANEL - Abnormal; Notable for the following components:      Result Value   Sodium 128 (*)    Potassium 3.3 (*)    Chloride 93 (*)    CO2 18 (*)    Glucose, Bld 123 (*)    BUN 74 (*)    Creatinine, Ser 2.15 (*)    Albumin 2.8 (*)    GFR calc non Af Amer 23 (*)    GFR calc Af Amer 26 (*)    Anion gap 17 (*)    All other components within normal limits  CBC WITH DIFFERENTIAL/PLATELET - Abnormal; Notable for the following components:   WBC 13.2 (*)    Hemoglobin 10.6 (*)    HCT 34.7 (*)    Platelets 1,033 (*)    Neutro Abs 9.4 (*)    Abs Immature Granulocytes 0.49 (*)    All other components within normal limits  URINALYSIS, ROUTINE W REFLEX MICROSCOPIC - Abnormal; Notable for the following components:   APPearance CLOUDY (*)    Hgb urine dipstick MODERATE (*)    Leukocytes, UA LARGE (*)    WBC, UA >50 (*)    Bacteria, UA MANY (*)    All other components within normal limits  CULTURE, BLOOD (ROUTINE X 2)  CULTURE, BLOOD (ROUTINE X 2)  URINE CULTURE  TSH  T4, FREE  TSH  T3, FREE  HIV ANTIBODY (ROUTINE TESTING W REFLEX)  BASIC METABOLIC PANEL  I-STAT CG4 LACTIC ACID, ED    EKG EKG Interpretation  Date/Time:  Wednesday October 14 2018 10:56:12 EST Ventricular Rate:  120 PR Interval:    QRS Duration: 87 QT Interval:  293 QTC Calculation: 414 R Axis:   102 Text Interpretation:  Sinus tachycardia Right axis deviation Low voltage, precordial leads Anteroseptal infarct, old Borderline T abnormalities, inferior leads Since prior  ECG< rate has increased Confirmed by Gareth Morgan [phone]) on 10/14/2018 11:15:20 AM   Radiology Dg Chest 2 View  Result Date: 10/14/2018 CLINICAL DATA:  Weakness. Dysphagia for 10 days. Tachycardia today. EXAM: CHEST - 2 VIEW COMPARISON:  Two-view chest x-ray 04/05/2007 FINDINGS: The heart is enlarged. There is no edema or effusion. No focal airspace disease is present. IMPRESSION: Cardiomegaly without failure. Electronically Signed   By: San Morelle M.D.   On: 10/14/2018 12:41    Procedures .Critical Care Performed by: Gareth Morgan, MD Authorized by: Gareth Morgan, MD   Critical care provider statement:    Critical care time (minutes):  30   Critical care was necessary to treat or prevent imminent or life-threatening deterioration of the following conditions:  Sepsis   Critical care was time spent personally by me on the following activities:  Development of treatment plan with patient or surrogate, examination of patient, evaluation of patient's response to treatment, pulse oximetry, re-evaluation of patient's condition, ordering and review of laboratory studies, ordering and review of radiographic studies and ordering and performing treatments and interventions   (including critical care time)  Medications Ordered in ED Medications  cefTRIAXone (ROCEPHIN) 1 g in sodium chloride 0.9 % 100 mL IVPB (0 g Intravenous Stopped 10/14/18 1305)  SUMAtriptan (IMITREX) tablet 100 mg (has no administration in time range)  fenofibrate tablet 160 mg (160 mg Oral Given 10/14/18 1900)  propranolol ER (INDERAL LA) 24 hr capsule 120 mg (has no administration in time range)  liothyronine (CYTOMEL) tablet 12.5 mcg (has no administration in time range)  levothyroxine (SYNTHROID, LEVOTHROID) tablet 125 mcg (has no administration in time range)  mirabegron ER (MYRBETRIQ) tablet 50 mg (has no administration in time range)  vitamin B-12 (CYANOCOBALAMIN) tablet 500 mcg (has no administration  in time range)  calcium-vitamin D (OSCAL WITH D) 500-200 MG-UNIT per tablet 2 tablet (has no administration in time range)  multivitamin with minerals tablet 1 tablet (has no administration in time range)  montelukast (SINGULAIR) tablet 10 mg (has no administration in time range)  heparin injection 5,000 Units (has no administration in time range)  dextrose 5 %-0.45 % sodium chloride infusion ( Intravenous New Bag/Given 10/14/18 1851)  acetaminophen (TYLENOL) tablet 650 mg (has no administration in time range)    Or  acetaminophen (TYLENOL) suppository 650 mg (has no administration in time range)  HYDROcodone-acetaminophen (NORCO/VICODIN) 5-325 MG per tablet 1-2 tablet (has no administration in time range)  ondansetron (ZOFRAN) tablet 4 mg (has no administration in time range)    Or  ondansetron (ZOFRAN) injection 4 mg (has no administration in time range)  albuterol (PROVENTIL) (2.5 MG/3ML) 0.083% nebulizer solution 2.5 mg (has no administration in time range)  irbesartan (AVAPRO) tablet 150 mg (has no administration in time range)  hydrochlorothiazide (MICROZIDE) capsule 12.5 mg (has no administration in time range)  sodium chloride 0.9 % bolus 1,000 mL (0 mLs Intravenous Stopped 10/14/18 1352)    And  sodium chloride 0.9 % bolus 1,000 mL (0 mLs Intravenous Stopped 10/14/18 1352)    And  sodium chloride 0.9 % bolus 500 mL (0 mLs Intravenous Stopped 10/14/18 1352)     Initial Impression / Assessment and Plan /  ED Course  I have reviewed the triage vital signs and the nursing notes.  Pertinent labs & imaging results that were available during my care of the patient were reviewed by me and considered in my medical decision making (see chart for details).     67yo female with history above presents concern for generalized weakness, loss of appetite for several weeks.  On arrival to the emergency department, patient with sinus tachycardia, and hypotension, differential diagnoses include  pulmonary embolus, sepsis, dehydration.  No evidence of GI bleed.  Given concern for possible sepsis, patient was given IV fluids and Rocephin.  Lactic acid returned within normal limits, however CMP shows mild acute kidney injury as well as hyponatremia.  Urinalysis shows signs of minimal tract infection.  No evidence of pneumonia on chest x-ray.  Suspect most likely etiology of patient's tachycardia, hypotension and generalized weakness is sepsis secondary to urinary source.  At this time have low suspicion for PE.   Vital signs improved with fluids. Admitted to hospitalist service for further care.   Final Clinical Impressions(s) / ED Diagnoses   Final diagnoses:  Sepsis with acute renal failure without septic shock, due to unspecified organism, unspecified acute renal failure type [facility])  Urinary tract infection without hematuria, site unspecified  Hyponatremia    ED Discharge Orders    None       Gareth Morgan, MD 10/14/18 2117

## 2018-10-14 NOTE — ED Notes (Signed)
ED Provider at bedside. 

## 2018-10-14 NOTE — Telephone Encounter (Signed)
Agree with advisement, will watch for notes

## 2018-10-14 NOTE — Telephone Encounter (Signed)
Mr Popson called; pt is no appetite, not eating solid food, drinking some meal replacement drinks; pt can barely keep down liquids; extremely weak  Started 2 weeks ago. No N&V or diarrhea or abd pain. Pt does not know why lost appetite except 3 weeks ago had a virus. Mr Haeberle is extremely concerned about pt; Mr Lanni has been out of town. Mr Soule will take pt to [facility] ED for eval for dehydration and cause of loss of appetite. FYI to Dr Glori Bickers.

## 2018-10-14 NOTE — ED Triage Notes (Signed)
Pt arrives c/o generalized weakness and SOB starting yesterday. Pt is alert and oriented. Hx of anemia and thyroid problems. Pt denies any pain.

## 2018-10-14 NOTE — H&P (Addendum)
[facility]                                                                                    Patient Demographics  [name], is a 67 y.o. female  CSN: [id number]  MRN: [id number]  DOB - May 05, 1951  Admit Date - 10/14/2018  Outpatient Primary MD for the patient is Tower, Wynelle Fanny, MD   With History of -  Past Medical History:  Diagnosis Date  . Allergic rhinitis   . Allergy   . Anemia, unspecified    b12 def.  . Asthma    controlled w/ singulair  . Herpes simplex without mention of complication    controlled w/ meds (no current fever blisters)  . History of kidney stones   . HLD (hyperlipidemia)    borderline-  no meds  . HTN (hypertension)    echo- 2006  . Hyperthyroidism hx ptu 2006   takes synthroid; had Graves disease, was treated for that- now thyroid doesn't work  . Leukocytosis, unspecified hx of 10 yrs ago   no problem since  . Migraine   . Mixed incontinence    urge and stress incontinence  . Neuromuscular disorder ([place])    hiatal hernia  . Renal cyst right    pt is unsure of this, has had kidney stones  . Ureteral calculi right    s/p laser  litho w/ stone extraction 01-28-11      Past Surgical History:  Procedure Laterality Date  . BREAST REDUCTION SURGERY  1994  . CERVICAL FUSION  2000   fusion c4-6  . CERVICAL FUSION  2005   fusion c6-7 and plate removal P7-1  . COLONOSCOPY    . CYSTOSCOPY W/ RETROGRADES  10/21/2011   Procedure: CYSTOSCOPY WITH RETROGRADE PYELOGRAM;  Surgeon: Ailene Rud, MD;  Location: [facility];  Service: Urology;  Laterality: Right;  CYSTOSCOPY RIGHT RETROGRADE, PYELOGRAM  URETEROSCOPY WITH HOLMIUM LASER AND STONE EXTRACTION  . CYSTOSCOPY/RETROGRADE/URETEROSCOPY/STONE EXTRACTION WITH BASKET  01-28-11   right   . REDUCTION MAMMAPLASTY Bilateral 1995  . TONSILLECTOMY  1960  . VAGINAL HYSTERECTOMY  1987   fibroids/anemia    in for   Chief Complaint  Patient presents with   . Weakness  . Shortness of Breath     HPI  [name]  is a 67 y.o. female, with a past medical history significant for ureteral calculi status post cystoscopy with stone extraction with basket in 2012 with bladder abnormalities and chronic UTIs presenting with 3 weeks history of decreased appetite, generalized weakness and fatigue.  Patient denies any history of fever, chills.  Patient reports nausea and vomiting.  She cannot keep anything in .  Patient denies any headaches, or dizziness.  In the emergency room the patient was noted to have a UTI.  She has a history of E. coli, pansensitive on 05/2018 and she was started on Rocephin IV her lactic acid was 1.5 to and she was found to be hyponatremic as well .  The patient reports dysphagia to solids but she can tolerate liquid diet.    Review of Systems    In addition to the  HPI above,  No Fever-chills, No Headache, No changes with Vision or hearing, No Chest pain, Cough or Shortness of Breath, No Abdominal pain, No Nausea or Vommitting, Bowel movements are regular, No dysuria, No new skin rashes or bruises, No new joints pains-aches,  No new weakness, tingling, numbness in any extremity, No polyuria, polydypsia or polyphagia, No significant Mental Stressors.  A full 10 point Review of Systems was done, except as stated above, all other Review of Systems were negative.   Social History Social History   Tobacco Use  . Smoking status: Never Smoker  . Smokeless tobacco: Never Used  Substance Use Topics  . Alcohol use: No    Alcohol/week: 0.0 standard drinks     Family History Family History  Problem Relation Age of Onset  . Heart failure Father   . Osteoporosis Mother   . Diabetes Mother   . Diabetes Unknown        Grandfather  . Coronary artery disease Unknown        Grandmother  . Breast cancer Neg Hx   . Colon cancer Neg Hx   . Esophageal cancer Neg Hx   . Stomach cancer Neg Hx   . Rectal cancer Neg Hx       Prior to Admission medications   Medication Sig Start Date End Date Taking? Authorizing Provider  acyclovir (ZOVIRAX) 200 MG capsule TAKE 2 CAPSULES DAILY Patient taking differently: Take 400 mg by mouth daily.  04/15/18  Yes Tower, Wynelle Fanny, MD  calcium-vitamin D (OSCAL WITH D) 500-200 MG-UNIT per tablet Take 2 tablets by mouth daily.    Yes [provider]  fenofibrate 160 MG tablet TAKE 1 TABLET BY MOUTH  DAILY Patient taking differently: Take 160 mg by mouth daily.  06/03/18  Yes Tower, Wynelle Fanny, MD  irbesartan-hydrochlorothiazide (AVALIDE) 150-12.5 MG tablet TAKE 1 TABLET BY MOUTH  EVERY MORNING 08/19/18  Yes Tower, Wynelle Fanny, MD  liothyronine (CYTOMEL) 25 MCG tablet Take 0.5 tablets (12.5 mcg total) by mouth every evening. Needs generic Patient taking differently: Take 12.5 mcg by mouth daily. Needs generic 04/15/18  Yes Tower, Wynelle Fanny, MD  mirabegron ER (MYRBETRIQ) 50 MG TB24 tablet Take 1 tablet (50 mg total) by mouth daily. 04/15/18  Yes Tower, Wynelle Fanny, MD  montelukast (SINGULAIR) 10 MG tablet Take 1 tablet (10 mg total) by mouth daily. 04/15/18  Yes Tower, Wynelle Fanny, MD  Multiple Vitamin (MULTIVITAMIN) tablet Take 1 tablet by mouth daily.    Yes [provider]  propranolol ER (INDERAL LA) 120 MG 24 hr capsule Take 1 capsule (120 mg total) by mouth daily. 04/15/18  Yes Tower, Wynelle Fanny, MD  SUMAtriptan (IMITREX) 100 MG tablet TAKE 1 TABLET FOR HEADACHE MAY REPEAT ONCE IN 2 HOURS IF NEEDED. MAXIMUM OF 2    TABLETS IN ONE DAY. Patient taking differently: Take 100 mg by mouth as needed for migraine or headache. TAKE 100 mg TABLET FOR HEADACHE MAY REPEAT ONCE IN 2 HOURS IF NEEDED. MAXIMUM OF 200 mg    TABLETS IN ONE DAY. 04/15/18  Yes Tower, Wynelle Fanny, MD  SYNTHROID 125 MCG tablet Take 1 tablet (125 mcg total) by mouth every morning. 04/15/18  Yes Tower, Wynelle Fanny, MD  vitamin B-12 (CYANOCOBALAMIN) 500 MCG tablet Take 500 mcg by mouth daily.     Yes [provider]    Allergies   Allergen Reactions  . Contrast Media [Iodinated Diagnostic Agents] Anaphylaxis  . Shellfish-Derived Products Anaphylaxis  . Septra [  Sulfamethoxazole-Trimethoprim]     Nausea     Physical Exam  Vitals  Blood pressure 133/79, pulse (!) 102, temperature 97.9 F (36.6 C), temperature source Oral, resp. rate (!) 23, SpO2 98 %.   1. General well-developed, well-nourished, looks tired  2. Normal affect and insight, Not Suicidal or Homicidal, Awake Alert, Oriented X 3.  3. No F.N deficits, grossly, patient moving all extremities.  4. Ears and Eyes appear Normal, Conjunctivae clear, PERRLA. Moist Oral Mucosa.  5. Supple Neck, No JVD, No cervical lymphadenopathy appriciated, No Carotid Bruits.  6. Symmetrical Chest wall movement, Good air movement bilaterally, CTAB.  7. RRR, No Gallops, Rubs or Murmurs, No Parasternal Heave.  8. Positive Bowel Sounds, Abdomen Soft, Non tender, No organomegaly appriciated,No rebound -guarding or rigidity.  9.  No Cyanosis, Normal Skin Turgor, No Skin Rash or Bruise.  10. Good muscle tone,  joints appear normal , no effusions, Normal ROM.    Data Review  CBC Recent Labs  Lab 10/14/18 1139  WBC 13.2*  HGB 10.6*  HCT 34.7*  PLT 1,033*  MCV 87.6  MCH 26.8  MCHC 30.5  RDW 13.6  LYMPHSABS 2.4  MONOABS 0.8  EOSABS 0.1  BASOSABS 0.0   ------------------------------------------------------------------------------------------------------------------  Chemistries  Recent Labs  Lab 10/14/18 1139  NA 128*  K 3.3*  CL 93*  CO2 18*  GLUCOSE 123*  BUN 74*  CREATININE 2.15*  CALCIUM 9.5  AST 18  ALT 25  ALKPHOS 121  BILITOT 0.7   ------------------------------------------------------------------------------------------------------------------ CrCl cannot be calculated (Unknown ideal weight.). ------------------------------------------------------------------------------------------------------------------ Recent Labs     10/14/18 1137  TSH 4.393     Coagulation profile No results for input(s): INR, PROTIME in the last 168 hours. ------------------------------------------------------------------------------------------------------------------- No results for input(s): DDIMER in the last 72 hours. -------------------------------------------------------------------------------------------------------------------  Cardiac Enzymes No results for input(s): CKMB, TROPONINI, MYOGLOBIN in the last 168 hours.  Invalid input(s): CK ------------------------------------------------------------------------------------------------------------------ Invalid input(s): POCBNP   ---------------------------------------------------------------------------------------------------------------  Urinalysis    Component Value Date/Time   COLORURINE YELLOW 10/14/2018 1324   APPEARANCEUR CLOUDY (A) 10/14/2018 1324   LABSPEC 1.005 10/14/2018 1324   PHURINE 6.0 10/14/2018 1324   GLUCOSEU NEGATIVE 10/14/2018 1324   HGBUR MODERATE (A) 10/14/2018 1324   BILIRUBINUR NEGATIVE 10/14/2018 1324   BILIRUBINUR Negative 02/24/2018 1129   KETONESUR NEGATIVE 10/14/2018 1324   PROTEINUR NEGATIVE 10/14/2018 1324   UROBILINOGEN 0.2 02/24/2018 1129   NITRITE NEGATIVE 10/14/2018 1324   LEUKOCYTESUR LARGE (A) 10/14/2018 1324    ----------------------------------------------------------------------------------------------------------------   Imaging results:   Dg Chest 2 View  Result Date: 10/14/2018 CLINICAL DATA:  Weakness. Dysphagia for 10 days. Tachycardia today. EXAM: CHEST - 2 VIEW COMPARISON:  Two-view chest x-ray 04/05/2007 FINDINGS: The heart is enlarged. There is no edema or effusion. No focal airspace disease is present. IMPRESSION: Cardiomegaly without failure. Electronically Signed   By: San Morelle M.D.   On: 10/14/2018 12:41    My personal review of EKG: Sinus tach at 120 bpm with right axis deviation and old  anteroseptal MI, and right ventricular conduction delay noted    Assessment & Plan  Sepsis/SIRS , source probably urinary tract infection  Urinary tract infection, history of E. coli UTI in the past    Patient was started on Rocephin IV  Dysphagia; patient may need an upper GI if symptoms persist  Hyponatremia Start normal saline  Acute on chronic renal failure    Start normal saline and monitor  Thrombocytosis Possibly reactive, will need to monitor  DVT Prophylaxis heparin  AM Labs Ordered, also please review Full Orders  Family Communication: Admission, patients condition and plan of care including tests being ordered have been discussed with the patient and husband who indicate understanding and agree with the plan and Code Status.  Code Status full  Disposition Plan: Home  Time spent in minutes : 39 minutes  Condition GUARDED   @SIGNATURE @

## 2018-10-15 ENCOUNTER — Encounter (HOSPITAL_COMMUNITY): Payer: Self-pay | Admitting: Physician Assistant

## 2018-10-15 DIAGNOSIS — R6520 Severe sepsis without septic shock: Secondary | ICD-10-CM

## 2018-10-15 DIAGNOSIS — N390 Urinary tract infection, site not specified: Secondary | ICD-10-CM

## 2018-10-15 DIAGNOSIS — R1310 Dysphagia, unspecified: Secondary | ICD-10-CM

## 2018-10-15 DIAGNOSIS — R630 Anorexia: Secondary | ICD-10-CM

## 2018-10-15 DIAGNOSIS — N179 Acute kidney failure, unspecified: Secondary | ICD-10-CM

## 2018-10-15 DIAGNOSIS — A419 Sepsis, unspecified organism: Principal | ICD-10-CM

## 2018-10-15 DIAGNOSIS — E871 Hypo-osmolality and hyponatremia: Secondary | ICD-10-CM

## 2018-10-15 LAB — BASIC METABOLIC PANEL
Anion gap: 8 (ref 5–15)
BUN: 54 mg/dL — AB (ref 8–23)
CO2: 20 mmol/L — AB (ref 22–32)
CREATININE: 1.88 mg/dL — AB (ref 0.44–1.00)
Calcium: 8.2 mg/dL — ABNORMAL LOW (ref 8.9–10.3)
Chloride: 105 mmol/L (ref 98–111)
GFR calc Af Amer: 31 mL/min — ABNORMAL LOW (ref >60)
GFR calc non Af Amer: 27 mL/min — ABNORMAL LOW (ref >60)
GLUCOSE: 133 mg/dL — AB (ref 70–99)
POTASSIUM: 3.1 mmol/L — AB (ref 3.5–5.1)
Sodium: 133 mmol/L — ABNORMAL LOW (ref 135–145)

## 2018-10-15 LAB — T4, FREE: Free T4: 1.19 ng/dL (ref 0.82–1.77)

## 2018-10-15 LAB — T3, FREE: T3 FREE: 1.3 pg/mL — AB (ref 2.0–4.4)

## 2018-10-15 LAB — TSH: TSH: 3.568 u[IU]/mL (ref 0.350–4.500)

## 2018-10-15 LAB — IRON AND TIBC
Iron: 12 ug/dL — ABNORMAL LOW (ref 28–170)
Saturation Ratios: 5 % — ABNORMAL LOW (ref 10.4–31.8)
TIBC: 218 ug/dL — ABNORMAL LOW (ref 250–450)
UIBC: 206 ug/dL

## 2018-10-15 LAB — HIV ANTIBODY (ROUTINE TESTING W REFLEX): HIV Screen 4th Generation wRfx: NONREACTIVE

## 2018-10-15 MED ORDER — ENSURE ENLIVE PO LIQD
237.0000 mL | Freq: Four times a day (QID) | ORAL | Status: DC
  Administered 2018-10-15 – 2018-10-17 (×8): 237 mL via ORAL

## 2018-10-15 MED ORDER — POTASSIUM CHLORIDE CRYS ER 20 MEQ PO TBCR
40.0000 meq | EXTENDED_RELEASE_TABLET | Freq: Once | ORAL | Status: AC
  Administered 2018-10-15: 40 meq via ORAL
  Filled 2018-10-15: qty 2

## 2018-10-15 MED ORDER — SODIUM CHLORIDE 0.9 % IV SOLN
INTRAVENOUS | Status: DC
  Administered 2018-10-15 – 2018-10-17 (×2): via INTRAVENOUS

## 2018-10-15 NOTE — Progress Notes (Signed)
PROGRESS NOTE    [name]  KXF:[id number] DOB: 1951/08/05 DOA: 10/14/2018 PCP: Abner Greenspan, MD  Outpatient Specialists:   Brief Narrative:  [name]  is a  67 year old female, with past medical history significant for chronic UTIs, bladder abnormalities, ureteral calculi status post cystoscopy and stone extraction with basket in 2012, colonic polyp, hypertension, mixed incontinence, migraine, renal cyst, hyperthyroidism (Grave's disease)/hypothyroidism (on synthroid), asthma, hyperlipidemia, anemia, CKD stage III and hiatal hernia.  According to the patient, her problems started about 3 weeks ago with what appeared to be URI/viral syndrome.  Over the last 2 weeks, patient has developed significant poor appetite, 15 pound weight loss, dysphagia for solids, generalized weakness and fatigue and urinary frequency.  Specifically, patient denied fever, chills, nausea or vomiting, abdominal pain, dysuria.  Patient has never had an EGD.  Patient had colonoscopy 3 to 4 months ago done by [facility], and polyps were found.  On presentation to the hospital, urinalysis done was suggestive of possible UTI.  There is documentation of UTI/sepsis on presentation.  Patient is currently on IV Rocephin.  Will consult GI team as the patient may need EGD.  Will also start patient on Megace.  Low potassium (3.1), worsening renal function, with serum creatinine of 1.88 were noted.  Low potassium will be repleted.  Will hold HCTZ and irbesartan due to worsening renal function and hypokalemia.  Blood pressure is in the low normal range, will continue to monitor off of anti-hypertensive medications.  Will also check TSH and free T4.  Psychomotor retardation, depressed facie with no reactivity were noted, but patient denied being depressed.  Patient's husband reported prior history of depression when patient had active thyroid problems.  Further management will depend on hospital course.  Assessment & Plan:     Active Problems:   Sepsis ([place])   Post viral syndrome: Continues supportive care.  Dysphagia for solids: Consult GI. Likely EGD.  UTI (with documented sepsis): Follow cultures. Continue IV antibiotics.  Anorexia/weight loss: In the setting of dysphagia, and patient's age, this is worrisome. Consult GI for EGD. Low threshold to introduce Megace. Further management depend on hospital course.  Hypokalemia: Discontinue HCTZ. Monitor and repeat.  Acute kidney injury on chronic kidney disease: This is a irbesartan. Continue to monitor  Thyroid disease: Check TSH and free T4  Depressed affect: Rule out underlying depressive illness. We will continue to monitor during the hospital stay.  Thrombocytosis: Continue to monitor. CBC in the morning. Further management will depend on the trend.  Hyponatremia: Possibly pre renal. Patient was on HCTZ  DVT prophylaxis: Heparin Code Status: Full code Family Communication: Husband Disposition Plan: Home eventually.  Consultants:   GI tea  Procedures:   Patient will need an EGD.  Antimicrobials:   IV Rocephin   Subjective: Patient continues to report poor appetite and dysphagia. No fever or chills. Patient has urinary frequency, but not dysuria.  Objective: Vitals:   10/14/18 1759 10/14/18 2000 10/15/18 0337 10/15/18 1026  BP: 125/73 113/64 121/72 126/77  Pulse: (!) 111 (!) 122 (!) 106 (!) 106  Resp: 18 15 15 18   Temp: 98.4 F (36.9 C) (!) 97.5 F (36.4 C) 98.3 F (36.8 C) 98.7 F (37.1 C)  TempSrc: Oral Oral Oral Oral  SpO2: 100% 100% 98% 98%  Weight:  80.6 kg      Intake/Output Summary (Last 24 hours) at 10/15/2018 1225 Last data filed at 10/15/2018 0900 Gross per 24 hour  Intake 4498.11 ml  Output  650 ml  Net 3848.11 ml   Filed Weights   10/14/18 2000  Weight: 80.6 kg    Examination:  General exam: Appears calm and comfortable  Respiratory system: Clear to auscultation. Respiratory  effort normal. Cardiovascular system: S1 & S2. No pedal edema. Gastrointestinal system: Abdomen is obese, soft and nontender. No organomegaly or masses felt. Normal bowel sounds heard. Central nervous system: Alert and oriented. No focal neurological deficits. Extremities: No leg edema  Psychiatry: Judgement and insight appear normal. Mood & affect appropriate.     Data Reviewed: I have personally reviewed following labs and imaging studies  CBC: Recent Labs  Lab 10/14/18 1139  WBC 13.2*  NEUTROABS 9.4*  HGB 10.6*  HCT 34.7*  MCV 87.6  PLT 5,427*   Basic Metabolic Panel: Recent Labs  Lab 10/14/18 1139 10/15/18 0508  NA 128* 133*  K 3.3* 3.1*  CL 93* 105  CO2 18* 20*  GLUCOSE 123* 133*  BUN 74* 54*  CREATININE 2.15* 1.88*  CALCIUM 9.5 8.2*   GFR: Estimated Creatinine Clearance: 31.1 mL/min (A) (by C-G formula based on SCr of 1.88 mg/dL (H)). Liver Function Tests: Recent Labs  Lab 10/14/18 1139  AST 18  ALT 25  ALKPHOS 121  BILITOT 0.7  PROT 7.2  ALBUMIN 2.8*   No results for input(s): LIPASE, AMYLASE in the last 168 hours. No results for input(s): AMMONIA in the last 168 hours. Coagulation Profile: No results for input(s): INR, PROTIME in the last 168 hours. Cardiac Enzymes: No results for input(s): CKTOTAL, CKMB, CKMBINDEX, TROPONINI in the last 168 hours. BNP (last 3 results) No results for input(s): PROBNP in the last 8760 hours. HbA1C: No results for input(s): HGBA1C in the last 72 hours. CBG: No results for input(s): GLUCAP in the last 168 hours. Lipid Profile: No results for input(s): CHOL, HDL, LDLCALC, TRIG, CHOLHDL, LDLDIRECT in the last 72 hours. Thyroid Function Tests: Recent Labs    10/14/18 1139 10/14/18 1827  TSH  --  3.435  FREET4 1.20  --   T3FREE 1.3*  --    Anemia Panel: No results for input(s): VITAMINB12, FOLATE, FERRITIN, TIBC, IRON, RETICCTPCT in the last 72 hours. Urine analysis:    Component Value Date/Time    COLORURINE YELLOW 10/14/2018 1324   APPEARANCEUR CLOUDY (A) 10/14/2018 1324   LABSPEC 1.005 10/14/2018 1324   PHURINE 6.0 10/14/2018 1324   GLUCOSEU NEGATIVE 10/14/2018 1324   HGBUR MODERATE (A) 10/14/2018 1324   BILIRUBINUR NEGATIVE 10/14/2018 1324   BILIRUBINUR Negative 02/24/2018 1129   KETONESUR NEGATIVE 10/14/2018 1324   PROTEINUR NEGATIVE 10/14/2018 1324   UROBILINOGEN 0.2 02/24/2018 1129   NITRITE NEGATIVE 10/14/2018 1324   LEUKOCYTESUR LARGE (A) 10/14/2018 1324   Sepsis Labs: @LABRCNTIP (procalcitonin:4,lacticidven:4)  ) Recent Results (from the past 240 hour(s))  Blood Culture (routine x 2)     Status: None (Preliminary result)   Collection Time: 10/14/18 12:47 PM  Result Value Ref Range Status   Specimen Description BLOOD LEFT ANTECUBITAL  Final   Special Requests   Final    BOTTLES DRAWN AEROBIC AND ANAEROBIC Blood Culture results may not be optimal due to an inadequate volume of blood received in culture bottles   Culture   Final    NO GROWTH < 24 HOURS Performed at [facility] [address], [place], [place] [address]    Report Status PENDING  Incomplete  Blood Culture (routine x 2)     Status: None (Preliminary result)   Collection Time:  10/14/18 12:48 PM  Result Value Ref Range Status   Specimen Description BLOOD RIGHT HAND  Final   Special Requests   Final    BOTTLES DRAWN AEROBIC AND ANAEROBIC Blood Culture adequate volume   Culture   Final    NO GROWTH < 24 HOURS Performed at [facility], [address] [place], [place] [address]    Report Status PENDING  Incomplete  Urine culture     Status: None (Preliminary result)   Collection Time: 10/14/18  1:24 PM  Result Value Ref Range Status   Specimen Description URINE, CLEAN CATCH  Final   Special Requests NONE  Final   Culture   Final    CULTURE REINCUBATED FOR BETTER GROWTH Performed at [facility], [place] [address], [place],  [address]    Report Status PENDING  Incomplete           Radiology Studies: Dg Chest 2 View  Result Date: 10/14/2018 CLINICAL DATA:  Weakness. Dysphagia for 10 days. Tachycardia today. EXAM: CHEST - 2 VIEW COMPARISON:  Two-view chest x-ray 04/05/2007 FINDINGS: The heart is enlarged. There is no edema or effusion. No focal airspace disease is present. IMPRESSION: Cardiomegaly without failure. Electronically Signed   By: San Morelle M.D.   On: 10/14/2018 12:41        Scheduled Meds: . calcium-vitamin D  2 tablet Oral Daily  . feeding supplement (ENSURE ENLIVE)  237 mL Oral QID  . fenofibrate  160 mg Oral Daily  . heparin  5,000 Units Subcutaneous Q8H  . levothyroxine  125 mcg Oral QAC breakfast  . liothyronine  12.5 mcg Oral Daily  . mirabegron ER  50 mg Oral Daily  . montelukast  10 mg Oral Daily  . multivitamin with minerals  1 tablet Oral Daily  . propranolol ER  120 mg Oral Daily  . vitamin B-12  500 mcg Oral Daily   Continuous Infusions: . sodium chloride    . cefTRIAXone (ROCEPHIN)  IV 1 g (10/15/18 1032)     LOS: 1 day    Time spent: [place]    Dana Allan, MD  [facility] Pager #: [phone] 7PM-7AM contact night coverage as above

## 2018-10-15 NOTE — H&P (View-Only) (Signed)
                                                                           [facility] Consult: 1:09 PM 10/15/2018  LOS: 1 day    Referring Provider: Dr Ogbata  Primary Care Physician:  Tower, Marne A, MD Primary Gastroenterologist:  Dr. Nandigam    Reason for Consultation:  Dysphagia.  Anorexia.  Weight loss.   HPI: [name] is a 67 y.o. female.  PMH Htn.  Graves disease treated with PTU, now takes Synthroid.  Major depression.  Ureteral stones.  Chronic UTIs.  CKD stage 3. Degenerative spine dz.   Leukocytosis of unclear cause in 2008.  Thrombocytosis.  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   Surgeries outlined below.   05/2018 Colonoscopy.  Screening study.  Sigmoid tics, non-bleeding int hemorrhoids.  No prior EGDs.     3 weeks ago she had several days of cough and respiratory congestion which resolved.  She has had this a couple of times before and when evaluated by her MD it was deemed viral and no medications were prescribed so 3 weeks ago she did not go to see her doctor.  Starting then and continuing through admission is anorexia, feeling full.  Also having solid food dysphagia at the level of the upper esophageal sphincter.  She can swallow liquids and yogurt/sherbet type substances well as a smaller pills but larger pills and solid foods will not go down.  After regurgitating these she has stopped eating solid food.  She is also developed ulcers on her lips and soft palate.  She has lost weight.  She is developed fatigue, shortness of breath.  She is also been having some difficulty finding words and expressing herself.  Her husband has been working out of town for the last 3 weeks, when he saw her last weekend she seemed to be feeling better and he was not concerned.  Yesterday he became concerned when he spoke to her on the phone and came home.  They called the doctor who told them to  go to the ED. Diagnosed with UTI in ED, abx initiated.  + hyponatremia.  + tachycardia.   Cardiomegaly on CXR.   Hgb 10.6 (11.9 in 04/2018), MCV normal.  Platelets 1033 (511 in 04/2018).  WBCs 13.2.   Other than Albumin 2.8, LFTs are normal.   K 3.1.  Na 128 >> 133.   + AKI TSH and free T4 are normal.   Past Medical History:  Diagnosis Date  . Allergic rhinitis   . Allergy   . Anemia    b12 def.  . Asthma    controlled w/ singulair  . Herpes simplex without mention of complication    controlled w/ meds (no current fever blisters)  . History of kidney stones   . HLD (hyperlipidemia)    borderline-  no meds  . HTN (hypertension)    echo- 2006  . Hyperthyroidism hx ptu 2006   takes synthroid; had Graves disease, was treated for that- now thyroid doesn't work  . Leukocytosis, unspecified hx of 10 yrs ago   no problem since  . Migraine   . Mixed incontinence    urge and stress   incontinence  . Neuromuscular disorder (HCC)    hiatal hernia  . Renal cyst right    pt is unsure of this, has had kidney stones  . Ureteral calculi right    s/p laser  litho w/ stone extraction 01-28-11    Past Surgical History:  Procedure Laterality Date  . BREAST REDUCTION SURGERY  1994  . CERVICAL FUSION  2000   fusion c4-6  . CERVICAL FUSION  2005   fusion c6-7 and plate removal c4-6  . COLONOSCOPY    . CYSTOSCOPY W/ RETROGRADES  10/21/2011   Procedure: CYSTOSCOPY WITH RETROGRADE PYELOGRAM;  Surgeon: Sigmund I Tannenbaum, MD;  Location: [facility];  Service: Urology;  Laterality: Right;  CYSTOSCOPY RIGHT RETROGRADE, PYELOGRAM  URETEROSCOPY WITH HOLMIUM LASER AND STONE EXTRACTION  . CYSTOSCOPY/RETROGRADE/URETEROSCOPY/STONE EXTRACTION WITH BASKET  01-28-11   right   . REDUCTION MAMMAPLASTY Bilateral 1995  . TONSILLECTOMY  1960  . VAGINAL HYSTERECTOMY  1987   fibroids/anemia    Prior to Admission medications   Medication Sig Start Date End Date Taking? Authorizing Provider    acyclovir (ZOVIRAX) 200 MG capsule TAKE 2 CAPSULES DAILY Patient taking differently: Take 400 mg by mouth daily.  04/15/18  Yes Tower, Marne A, MD  calcium-vitamin D (OSCAL WITH D) 500-200 MG-UNIT per tablet Take 2 tablets by mouth daily.    Yes [provider]  fenofibrate 160 MG tablet TAKE 1 TABLET BY MOUTH  DAILY Patient taking differently: Take 160 mg by mouth daily.  06/03/18  Yes Tower, Marne A, MD  irbesartan-hydrochlorothiazide (AVALIDE) 150-12.5 MG tablet TAKE 1 TABLET BY MOUTH  EVERY MORNING 08/19/18  Yes Tower, Marne A, MD  liothyronine (CYTOMEL) 25 MCG tablet Take 0.5 tablets (12.5 mcg total) by mouth every evening. Needs generic Patient taking differently: Take 12.5 mcg by mouth daily. Needs generic 04/15/18  Yes Tower, Marne A, MD  mirabegron ER (MYRBETRIQ) 50 MG TB24 tablet Take 1 tablet (50 mg total) by mouth daily. 04/15/18  Yes Tower, Marne A, MD  montelukast (SINGULAIR) 10 MG tablet Take 1 tablet (10 mg total) by mouth daily. 04/15/18  Yes Tower, Marne A, MD  Multiple Vitamin (MULTIVITAMIN) tablet Take 1 tablet by mouth daily.    Yes [provider]  propranolol ER (INDERAL LA) 120 MG 24 hr capsule Take 1 capsule (120 mg total) by mouth daily. 04/15/18  Yes Tower, Marne A, MD  SUMAtriptan (IMITREX) 100 MG tablet TAKE 1 TABLET FOR HEADACHE MAY REPEAT ONCE IN 2 HOURS IF NEEDED. MAXIMUM OF 2    TABLETS IN ONE DAY. Patient taking differently: Take 100 mg by mouth as needed for migraine or headache. TAKE 100 mg TABLET FOR HEADACHE MAY REPEAT ONCE IN 2 HOURS IF NEEDED. MAXIMUM OF 200 mg    TABLETS IN ONE DAY. 04/15/18  Yes Tower, Marne A, MD  SYNTHROID 125 MCG tablet Take 1 tablet (125 mcg total) by mouth every morning. 04/15/18  Yes Tower, Marne A, MD  vitamin B-12 (CYANOCOBALAMIN) 500 MCG tablet Take 500 mcg by mouth daily.     Yes [provider]    Scheduled Meds: . calcium-vitamin D  2 tablet Oral Daily  . feeding supplement (ENSURE ENLIVE)  237 mL Oral QID  .  fenofibrate  160 mg Oral Daily  . heparin  5,000 Units Subcutaneous Q8H  . levothyroxine  125 mcg Oral QAC breakfast  . liothyronine  12.5 mcg Oral Daily  . mirabegron ER  50 mg Oral Daily  .   montelukast  10 mg Oral Daily  . multivitamin with minerals  1 tablet Oral Daily  . propranolol ER  120 mg Oral Daily  . vitamin B-12  500 mcg Oral Daily   Infusions: . sodium chloride    . cefTRIAXone (ROCEPHIN)  IV 1 g (10/15/18 1032)   PRN Meds: acetaminophen **OR** acetaminophen, albuterol, HYDROcodone-acetaminophen, ondansetron **OR** ondansetron (ZOFRAN) IV, SUMAtriptan   Allergies as of 10/14/2018 - Review Complete 10/14/2018  Allergen Reaction Noted  . Contrast media [iodinated diagnostic agents] Anaphylaxis 10/18/2011  . Shellfish-derived products Anaphylaxis 09/09/2011  . Septra [sulfamethoxazole-trimethoprim]  04/15/2018    Family History  Problem Relation Age of Onset  . Heart failure Father   . Osteoporosis Mother   . Diabetes Mother   . Diabetes Unknown        Grandfather  . Coronary artery disease Unknown        Grandmother  . Breast cancer Neg Hx   . Colon cancer Neg Hx   . Esophageal cancer Neg Hx   . Stomach cancer Neg Hx   . Rectal cancer Neg Hx     Social History   Socioeconomic History  . Marital status: Married    Spouse name: Not on file  . Number of children: 1  . Years of education: Not on file  . Highest education level: Not on file  Occupational History  . Occupation: Programmer    Employer: [employer]  Social Needs  . Financial resource strain: Not on file  . Food insecurity:    Worry: Not on file    Inability: Not on file  . Transportation needs:    Medical: Not on file    Non-medical: Not on file  Tobacco Use  . Smoking status: Never Smoker  . Smokeless tobacco: Never Used  Substance and Sexual Activity  . Alcohol use: No    Alcohol/week: 0.0 standard drinks  . Drug use: No  . Sexual activity: Not on file  Lifestyle  .  Physical activity:    Days per week: Not on file    Minutes per session: Not on file  . Stress: Not on file  Relationships  . Social connections:    Talks on phone: Not on file    Gets together: Not on file    Attends religious service: Not on file    Active member of club or organization: Not on file    Attends meetings of clubs or organizations: Not on file    Relationship status: Not on file  . Intimate partner violence:    Fear of current or ex partner: Not on file    Emotionally abused: Not on file    Physically abused: Not on file    Forced sexual activity: Not on file  Other Topics Concern  . Not on file  Social History Narrative   Married      1 son      Programmer for [employer]      Walks for exercise    REVIEW OF SYSTEMS: Constitutional:  Per HPI ENT:  No nose bleeds.  Lips and soft palate. Pulm: Dyspnea on exertion.  No cough. CV:  No palpitations, no LE edema.  GU:  No hematuria, no frequency GI:  Per HPI Heme: Denies unusual or excessive bleeding or bruising. Transfusions: Previous blood product transfusions. Neuro:  No headaches, no peripheral tingling or numbness.  Some dizziness.  No syncope. Derm: Sores on her lip and soft palate. Endocrine:  No sweats or   chills.  No polyuria or dysuria Immunization: Viewed vaccination history.   She is up-to-date on multiple vaccines but I do not see a flu shot for this year. Travel:  None beyond local counties in last few months.    PHYSICAL EXAM: Vital signs in last 24 hours: Vitals:   10/15/18 0337 10/15/18 1026  BP: 121/72 126/77  Pulse: (!) 106 (!) 106  Resp: 15 18  Temp: 98.3 F (36.8 C) 98.7 F (37.1 C)  SpO2: 98% 98%   Wt Readings from Last 3 Encounters:  10/14/18 80.6 kg  05/19/18 81.2 kg  05/06/18 81.6 kg    General: Pale, alert.   Looks comfortable.  Looks mildly ill Head: No facial asymmetry or swelling.  Pale, somewhat doughy appearance to the skin Eyes: No scleral icterus.  No  conjunctival pallor.  EOMI. Ears: Not hard of hearing Nose: No discharge or congestion. Mouth: Ulcerative type lesions on the lips.  Punctate red lesions on the soft palate.  Oral mucosa moist, pink, clear.  Tongue midline. Neck: No JVD, no masses, no thyromegaly. Lungs: Clear bilaterally.  No labored breathing, no cough. Heart: Slightly tachycardic in the 1 teens.  Rhythm is regular.  S1, S2 present.  No MRG. Abdomen: Not tender, not distended.  No HSM, hernias, bruits, masses.  Bowel sounds hypoactive but normal quality..   Rectal: Deferred Musc/Skeltl: No joint redness, swelling or gross deformity. Extremities: No CCE. Neurologic: Alert.  Oriented x3.  Slight aphasia, mostly delay in ability to answer questions sometimes word finding difficulty.  No limb weakness or tremor. Skin: Other than being pale, there is no suspicious sores, rashes or lesions. Tattoos: None Nodes: No cervical adenopathy. Psych: Cooperative, calm, pleasant.  Affect is somewhat depressed/subdued.  Intake/Output from previous day: 11/06 0701 - 11/07 0700 In: 4378.1 [P.O.:1080; I.V.:798.1; IV Piggyback:2500] Out: 650 [Urine:650] Intake/Output this shift: Total I/O In: 120 [P.O.:120] Out: -   LAB RESULTS: Recent Labs    10/14/18 1139  WBC 13.2*  HGB 10.6*  HCT 34.7*  PLT 1,033*   BMET Lab Results  Component Value Date   NA 133 (L) 10/15/2018   NA 128 (L) 10/14/2018   NA 138 05/27/2018   K 3.1 (L) 10/15/2018   K 3.3 (L) 10/14/2018   K 4.3 05/27/2018   CL 105 10/15/2018   CL 93 (L) 10/14/2018   CL 103 05/27/2018   CO2 20 (L) 10/15/2018   CO2 18 (L) 10/14/2018   CO2 25 05/27/2018   GLUCOSE 133 (H) 10/15/2018   GLUCOSE 123 (H) 10/14/2018   GLUCOSE 102 (H) 05/27/2018   BUN 54 (H) 10/15/2018   BUN 74 (H) 10/14/2018   BUN 23 05/27/2018   CREATININE 1.88 (H) 10/15/2018   CREATININE 2.15 (H) 10/14/2018   CREATININE 1.32 (H) 05/27/2018   CALCIUM 8.2 (L) 10/15/2018   CALCIUM 9.5 10/14/2018    CALCIUM 9.7 05/27/2018   LFT Recent Labs    10/14/18 1139  PROT 7.2  ALBUMIN 2.8*  AST 18  ALT 25  ALKPHOS 121  BILITOT 0.7   PT/INR No results found for: INR, PROTIME Hepatitis Panel No results for input(s): HEPBSAG, HCVAB, HEPAIGM, HEPBIGM in the last 72 hours. C-Diff No components found for: CDIFF Lipase  No results found for: LIPASE  Drugs of Abuse  No results found for: LABOPIA, COCAINSCRNUR, LABBENZ, AMPHETMU, THCU, LABBARB   RADIOLOGY STUDIES: Dg Chest 2 View  Result Date: 10/14/2018 CLINICAL DATA:  Weakness. Dysphagia for 10 days. Tachycardia today. EXAM: CHEST -   2 VIEW COMPARISON:  Two-view chest x-ray 04/05/2007 FINDINGS: The heart is enlarged. There is no edema or effusion. No focal airspace disease is present. IMPRESSION: Cardiomegaly without failure. Electronically Signed   By: Christopher  Mattern M.D.   On: 10/14/2018 12:41     IMPRESSION:   *    Anorexia.  Dysphagia. With the sores in her mouth and on her lips, I am suspicious of viral or other infectious esophagitis.  *     UTI.  On Rocephin.  *    Normocytic anemia.  Takes oral B12 at home.  Iron, TIBC and iron saturation low.  *    Hypothyroidism following remote treatment with PTU for Graves' disease.  TSH and free T4 are normal.  *    Thrombocytosis. Previously had leukocytosis of unclear etiology in 2008.  *   Hyponatremia.  Sodium improved from 128 >> 133.    PLAN:     *   Check ferritin level.  *   Placed orders for upper endoscopy with possible esophageal dilatation for tomorrow.  This is probably going to happen in the afternoon and may be with conscious sedation rather than MAC sedation.   Nicklaus Alviar  10/15/2018, 1:09 PM Phone [phone]     

## 2018-10-15 NOTE — Consult Note (Signed)
[facility] Consult: 1:09 PM 10/15/2018  LOS: 1 day    Referring Provider: Dr Marthenia Rolling  Primary Care Physician:  Tower, Wynelle Fanny, MD Primary Gastroenterologist:  Dr. Silverio Decamp    Reason for Consultation:  Dysphagia.  Anorexia.  Weight loss.   HPI: [name] is a 67 y.o. female.  PMH Htn.  Graves disease treated with PTU, now takes Synthroid.  Major depression.  Ureteral stones.  Chronic UTIs.  CKD stage 3. Degenerative spine dz.   Leukocytosis of unclear cause in 2008.  Thrombocytosis.  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   Surgeries outlined below.   05/2018 Colonoscopy.  Screening study.  Sigmoid tics, non-bleeding int hemorrhoids.  No prior EGDs.     3 weeks ago she had several days of cough and respiratory congestion which resolved.  She has had this a couple of times before and when evaluated by her MD it was deemed viral and no medications were prescribed so 3 weeks ago she did not go to see her doctor.  Starting then and continuing through admission is anorexia, feeling full.  Also having solid food dysphagia at the level of the upper esophageal sphincter.  She can swallow liquids and yogurt/sherbet type substances well as a smaller pills but larger pills and solid foods will not go down.  After regurgitating these she has stopped eating solid food.  She is also developed ulcers on her lips and soft palate.  She has lost weight.  She is developed fatigue, shortness of breath.  She is also been having some difficulty finding words and expressing herself.  Her husband has been working out of town for the last 3 weeks, when he saw her last weekend she seemed to be feeling better and he was not concerned.  Yesterday he became concerned when he spoke to her on the phone and came home.  They called the doctor who told them to  go to the ED. Diagnosed with UTI in ED, abx initiated.  + hyponatremia.  + tachycardia.   Cardiomegaly on CXR.   Hgb 10.6 (11.9 in 04/2018), MCV normal.  Platelets 1033 (511 in 04/2018).  WBCs 13.2.   Other than Albumin 2.8, LFTs are normal.   K 3.1.  Na 128 >> 133.   + AKI TSH and free T4 are normal.   Past Medical History:  Diagnosis Date  . Allergic rhinitis   . Allergy   . Anemia    b12 def.  . Asthma    controlled w/ singulair  . Herpes simplex without mention of complication    controlled w/ meds (no current fever blisters)  . History of kidney stones   . HLD (hyperlipidemia)    borderline-  no meds  . HTN (hypertension)    echo- 2006  . Hyperthyroidism hx ptu 2006   takes synthroid; had Graves disease, was treated for that- now thyroid doesn't work  . Leukocytosis, unspecified hx of 10 yrs ago   no problem since  . Migraine   . Mixed incontinence    urge and stress  incontinence  . Neuromuscular disorder ([place])    hiatal hernia  . Renal cyst right    pt is unsure of this, has had kidney stones  . Ureteral calculi right    s/p laser  litho w/ stone extraction 01-28-11    Past Surgical History:  Procedure Laterality Date  . BREAST REDUCTION SURGERY  1994  . CERVICAL FUSION  2000   fusion c4-6  . CERVICAL FUSION  2005   fusion c6-7 and plate removal A4-1  . COLONOSCOPY    . CYSTOSCOPY W/ RETROGRADES  10/21/2011   Procedure: CYSTOSCOPY WITH RETROGRADE PYELOGRAM;  Surgeon: Ailene Rud, MD;  Location: [facility];  Service: Urology;  Laterality: Right;  CYSTOSCOPY RIGHT RETROGRADE, PYELOGRAM  URETEROSCOPY WITH HOLMIUM LASER AND STONE EXTRACTION  . CYSTOSCOPY/RETROGRADE/URETEROSCOPY/STONE EXTRACTION WITH BASKET  01-28-11   right   . REDUCTION MAMMAPLASTY Bilateral 1995  . TONSILLECTOMY  1960  . VAGINAL HYSTERECTOMY  1987   fibroids/anemia    Prior to Admission medications   Medication Sig Start Date End Date Taking? Authorizing Provider    acyclovir (ZOVIRAX) 200 MG capsule TAKE 2 CAPSULES DAILY Patient taking differently: Take 400 mg by mouth daily.  04/15/18  Yes Tower, Wynelle Fanny, MD  calcium-vitamin D (OSCAL WITH D) 500-200 MG-UNIT per tablet Take 2 tablets by mouth daily.    Yes [provider]  fenofibrate 160 MG tablet TAKE 1 TABLET BY MOUTH  DAILY Patient taking differently: Take 160 mg by mouth daily.  06/03/18  Yes Tower, Wynelle Fanny, MD  irbesartan-hydrochlorothiazide (AVALIDE) 150-12.5 MG tablet TAKE 1 TABLET BY MOUTH  EVERY MORNING 08/19/18  Yes Tower, Wynelle Fanny, MD  liothyronine (CYTOMEL) 25 MCG tablet Take 0.5 tablets (12.5 mcg total) by mouth every evening. Needs generic Patient taking differently: Take 12.5 mcg by mouth daily. Needs generic 04/15/18  Yes Tower, Wynelle Fanny, MD  mirabegron ER (MYRBETRIQ) 50 MG TB24 tablet Take 1 tablet (50 mg total) by mouth daily. 04/15/18  Yes Tower, Wynelle Fanny, MD  montelukast (SINGULAIR) 10 MG tablet Take 1 tablet (10 mg total) by mouth daily. 04/15/18  Yes Tower, Wynelle Fanny, MD  Multiple Vitamin (MULTIVITAMIN) tablet Take 1 tablet by mouth daily.    Yes [provider]  propranolol ER (INDERAL LA) 120 MG 24 hr capsule Take 1 capsule (120 mg total) by mouth daily. 04/15/18  Yes Tower, Wynelle Fanny, MD  SUMAtriptan (IMITREX) 100 MG tablet TAKE 1 TABLET FOR HEADACHE MAY REPEAT ONCE IN 2 HOURS IF NEEDED. MAXIMUM OF 2    TABLETS IN ONE DAY. Patient taking differently: Take 100 mg by mouth as needed for migraine or headache. TAKE 100 mg TABLET FOR HEADACHE MAY REPEAT ONCE IN 2 HOURS IF NEEDED. MAXIMUM OF 200 mg    TABLETS IN ONE DAY. 04/15/18  Yes Tower, Wynelle Fanny, MD  SYNTHROID 125 MCG tablet Take 1 tablet (125 mcg total) by mouth every morning. 04/15/18  Yes Tower, Wynelle Fanny, MD  vitamin B-12 (CYANOCOBALAMIN) 500 MCG tablet Take 500 mcg by mouth daily.     Yes [provider]    Scheduled Meds: . calcium-vitamin D  2 tablet Oral Daily  . feeding supplement (ENSURE ENLIVE)  237 mL Oral QID  .  fenofibrate  160 mg Oral Daily  . heparin  5,000 Units Subcutaneous Q8H  . levothyroxine  125 mcg Oral QAC breakfast  . liothyronine  12.5 mcg Oral Daily  . mirabegron ER  50 mg Oral Daily  .  montelukast  10 mg Oral Daily  . multivitamin with minerals  1 tablet Oral Daily  . propranolol ER  120 mg Oral Daily  . vitamin B-12  500 mcg Oral Daily   Infusions: . sodium chloride    . cefTRIAXone (ROCEPHIN)  IV 1 g (10/15/18 1032)   PRN Meds: acetaminophen **OR** acetaminophen, albuterol, HYDROcodone-acetaminophen, ondansetron **OR** ondansetron (ZOFRAN) IV, SUMAtriptan   Allergies as of 10/14/2018 - Review Complete 10/14/2018  Allergen Reaction Noted  . Contrast media [iodinated diagnostic agents] Anaphylaxis 10/18/2011  . Shellfish-derived products Anaphylaxis 09/09/2011  . Septra [sulfamethoxazole-trimethoprim]  04/15/2018    Family History  Problem Relation Age of Onset  . Heart failure Father   . Osteoporosis Mother   . Diabetes Mother   . Diabetes Unknown        Grandfather  . Coronary artery disease Unknown        Grandmother  . Breast cancer Neg Hx   . Colon cancer Neg Hx   . Esophageal cancer Neg Hx   . Stomach cancer Neg Hx   . Rectal cancer Neg Hx     Social History   Socioeconomic History  . Marital status: Married    Spouse name: Not on file  . Number of children: 1  . Years of education: Not on file  . Highest education level: Not on file  Occupational History  . Occupation: Publishing rights manager: [employer]  . Financial resource strain: Not on file  . Food insecurity:    Worry: Not on file    Inability: Not on file  . Transportation needs:    Medical: Not on file    Non-medical: Not on file  Tobacco Use  . Smoking status: Never Smoker  . Smokeless tobacco: Never Used  Substance and Sexual Activity  . Alcohol use: No    Alcohol/week: 0.0 standard drinks  . Drug use: No  . Sexual activity: Not on file  Lifestyle  .  Physical activity:    Days per week: Not on file    Minutes per session: Not on file  . Stress: Not on file  Relationships  . Social connections:    Talks on phone: Not on file    Gets together: Not on file    Attends religious service: Not on file    Active member of club or organization: Not on file    Attends meetings of clubs or organizations: Not on file    Relationship status: Not on file  . Intimate partner violence:    Fear of current or ex partner: Not on file    Emotionally abused: Not on file    Physically abused: Not on file    Forced sexual activity: Not on file  Other Topics Concern  . 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  Lips and soft palate. Pulm: Dyspnea on exertion.  No cough. CV:  No palpitations, no LE edema.  GU:  No hematuria, no frequency GI:  Per HPI Heme: Denies unusual or excessive bleeding or bruising. Transfusions: Previous blood product transfusions. Neuro:  No headaches, no peripheral tingling or numbness.  Some dizziness.  No syncope. Derm: Sores on her lip and soft palate. Endocrine:  No sweats or  chills.  No polyuria or dysuria Immunization: Viewed vaccination history.   She is up-to-date on multiple vaccines but I do not see a flu shot for this year. Travel:  None beyond local counties in last few months.    PHYSICAL EXAM: Vital signs in last 24 hours: Vitals:   10/15/18 0337 10/15/18 1026  BP: 121/72 126/77  Pulse: (!) 106 (!) 106  Resp: 15 18  Temp: 98.3 F (36.8 C) 98.7 F (37.1 C)  SpO2: 98% 98%   Wt Readings from Last 3 Encounters:  10/14/18 80.6 kg  05/19/18 81.2 kg  05/06/18 81.6 kg    General: Pale, alert.   Looks comfortable.  Looks mildly ill Head: No facial asymmetry or swelling.  Pale, somewhat doughy appearance to the skin Eyes: No scleral icterus.  No  conjunctival pallor.  EOMI. Ears: Not hard of hearing Nose: No discharge or congestion. Mouth: Ulcerative type lesions on the lips.  Punctate red lesions on the soft palate.  Oral mucosa moist, pink, clear.  Tongue midline. Neck: No JVD, no masses, no thyromegaly. Lungs: Clear bilaterally.  No labored breathing, no cough. Heart: Slightly tachycardic in the 1 teens.  Rhythm is regular.  S1, S2 present.  No MRG. Abdomen: Not tender, not distended.  No HSM, hernias, bruits, masses.  Bowel sounds hypoactive but normal quality..   Rectal: Deferred Musc/Skeltl: No joint redness, swelling or gross deformity. Extremities: No CCE. Neurologic: Alert.  Oriented x3.  Slight aphasia, mostly delay in ability to answer questions sometimes word finding difficulty.  No limb weakness or tremor. Skin: Other than being pale, there is no suspicious sores, rashes or lesions. Tattoos: None Nodes: No cervical adenopathy. Psych: Cooperative, calm, pleasant.  Affect is somewhat depressed/subdued.  Intake/Output from previous day: 11/06 0701 - 11/07 0700 In: 4378.1 [P.O.:1080; I.V.:798.1; IV Piggyback:2500] Out: 650 [Urine:650] Intake/Output this shift: Total I/O In: 120 [P.O.:120] Out: -   LAB RESULTS: Recent Labs    10/14/18 1139  WBC 13.2*  HGB 10.6*  HCT 34.7*  PLT 1,033*   BMET Lab Results  Component Value Date   NA 133 (L) 10/15/2018   NA 128 (L) 10/14/2018   NA 138 05/27/2018   K 3.1 (L) 10/15/2018   K 3.3 (L) 10/14/2018   K 4.3 05/27/2018   CL 105 10/15/2018   CL 93 (L) 10/14/2018   CL 103 05/27/2018   CO2 20 (L) 10/15/2018   CO2 18 (L) 10/14/2018   CO2 25 05/27/2018   GLUCOSE 133 (H) 10/15/2018   GLUCOSE 123 (H) 10/14/2018   GLUCOSE 102 (H) 05/27/2018   BUN 54 (H) 10/15/2018   BUN 74 (H) 10/14/2018   BUN 23 05/27/2018   CREATININE 1.88 (H) 10/15/2018   CREATININE 2.15 (H) 10/14/2018   CREATININE 1.32 (H) 05/27/2018   CALCIUM 8.2 (L) 10/15/2018   CALCIUM 9.5 10/14/2018    CALCIUM 9.7 05/27/2018   LFT Recent Labs    10/14/18 1139  PROT 7.2  ALBUMIN 2.8*  AST 18  ALT 25  ALKPHOS 121  BILITOT 0.7   PT/INR No results found for: INR, PROTIME Hepatitis Panel No results for input(s): HEPBSAG, HCVAB, HEPAIGM, HEPBIGM in the last 72 hours. C-Diff No components found for: CDIFF Lipase  No results found for: LIPASE  Drugs of Abuse  No results found for: LABOPIA, COCAINSCRNUR, LABBENZ, AMPHETMU, THCU, LABBARB   RADIOLOGY STUDIES: Dg Chest 2 View  Result Date: 10/14/2018 CLINICAL DATA:  Weakness. Dysphagia for 10 days. Tachycardia today. EXAM: CHEST -  2 VIEW COMPARISON:  Two-view chest x-ray 04/05/2007 FINDINGS: The heart is enlarged. There is no edema or effusion. No focal airspace disease is present. IMPRESSION: Cardiomegaly without failure. Electronically Signed   By: San Morelle M.D.   On: 10/14/2018 12:41     IMPRESSION:   *    Anorexia.  Dysphagia. With the sores in her mouth and on her lips, I am suspicious of viral or other infectious esophagitis.  *     UTI.  On Rocephin.  *    Normocytic anemia.  Takes oral B12 at home.  Iron, TIBC and iron saturation low.  *    Hypothyroidism following remote treatment with PTU for Graves' disease.  TSH and free T4 are normal.  *    Thrombocytosis. Previously had leukocytosis of unclear etiology in 2008.  *   Hyponatremia.  Sodium improved from 128 >> 133.    PLAN:     *   Check ferritin level.  *   Placed orders for upper endoscopy with possible esophageal dilatation for tomorrow.  This is probably going to happen in the afternoon and may be with conscious sedation rather than MAC sedation.   Azucena Freed  10/15/2018, 1:09 PM Phone [phone]

## 2018-10-15 NOTE — Progress Notes (Signed)
Initial Nutrition Assessment  DOCUMENTATION CODES:   Not applicable  INTERVENTION:  Provide Ensure Enlive po QID, each supplement provides 350 kcal and 20 grams of protein.  Encourage adequate PO intake.   NUTRITION DIAGNOSIS:   Inadequate oral intake related to poor appetite as evidenced by per patient/family report.  GOAL:   Patient will meet greater than or equal to 90% of their needs  MONITOR:   PO intake, Supplement acceptance, Labs, Weight trends, I & O's, Skin  REASON FOR ASSESSMENT:   Malnutrition Screening Tool    ASSESSMENT:    67 y.o. female, with a past medical history significant for ureteral calculi status post cystoscopy with stone extraction with basket in 2012 with bladder abnormalities and chronic UTIs presenting with 2-3 weeks history of decreased appetite, generalized weakness and fatigue. In the emergency room the patient was noted to have a UTI.  Pt reports having a decreased appetite which has been ongoing over the past 2 weeks. Pt reports she has only been consuming up to four protein shakes a day (Ensure/Boost) and eating ice cream, sherbet, and jello. Pt with no weight loss per weight records. Pt did not consume breakfast full liquids tray this AM, thus Glucerna shake was given via nursing staff. RD to order Ensure to aid in caloric and protein needs. Pt encouraged to eat her food at meals and to drink her supplements.   Labs and medications reviewed.   NUTRITION - FOCUSED PHYSICAL EXAM:    Most Recent Value  Orbital Region  Unable to assess  Upper Arm Region  No depletion  Thoracic and Lumbar Region  No depletion  Buccal Region  Unable to assess  Temple Region  No depletion  Clavicle Bone Region  No depletion  Clavicle and Acromion Bone Region  No depletion  Scapular Bone Region  No depletion  Dorsal Hand  Unable to assess  Patellar Region  Moderate depletion  Anterior Thigh Region  Moderate depletion  Posterior Calf Region  Moderate  depletion  Edema (RD Assessment)  None  Hair  Reviewed  Eyes  Reviewed  Mouth  Reviewed  Skin  Reviewed  Nails  Reviewed       Diet Order:   Diet Order            Diet full liquid Room service appropriate? Yes; Fluid consistency: Thin  Diet effective now              EDUCATION NEEDS:   Not appropriate for education at this time  Skin:  Skin Assessment: Reviewed RN Assessment  Last BM:  11/5  Height:   Ht Readings from Last 1 Encounters:  05/19/18 5\' 6"  (1.676 m)    Weight:   Wt Readings from Last 1 Encounters:  10/14/18 80.6 kg    Ideal Body Weight:  59 kg  BMI:  Body mass index is 28.68 kg/m.  Estimated Nutritional Needs:   Kcal:  3557-3220  Protein:  85-95 grams  Fluid:  1.7 - 1.9 L/day    Corrin Parker, MS, RD, LDN Pager # [phone] After hours/ weekend pager # [phone]

## 2018-10-16 ENCOUNTER — Encounter (HOSPITAL_COMMUNITY): Payer: Self-pay | Admitting: *Deleted

## 2018-10-16 ENCOUNTER — Inpatient Hospital Stay (HOSPITAL_COMMUNITY): Payer: Medicare Other | Admitting: Anesthesiology

## 2018-10-16 ENCOUNTER — Encounter (HOSPITAL_COMMUNITY)
Admission: EM | Disposition: A | Discharge status: Home / Self Care | Payer: Self-pay | Source: Home / Self Care | Attending: Internal Medicine

## 2018-10-16 DIAGNOSIS — R1319 Other dysphagia: Secondary | ICD-10-CM

## 2018-10-16 DIAGNOSIS — K221 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: Secondary | ICD-10-CM

## 2018-10-16 HISTORY — PX: ESOPHAGOGASTRODUODENOSCOPY (EGD) WITH PROPOFOL: SHX5813

## 2018-10-16 HISTORY — PX: BIOPSY: SHX5522

## 2018-10-16 LAB — CBC WITH DIFFERENTIAL/PLATELET
Abs Immature Granulocytes: 0.15 10*3/uL — ABNORMAL HIGH (ref 0.00–0.07)
Basophils Absolute: 0 10*3/uL (ref 0.0–0.1)
Basophils Relative: 0 %
Eosinophils Absolute: 0.1 10*3/uL (ref 0.0–0.5)
Eosinophils Relative: 1 %
HCT: 26.5 % — ABNORMAL LOW (ref 36.0–46.0)
Hemoglobin: 8 g/dL — ABNORMAL LOW (ref 12.0–15.0)
Immature Granulocytes: 1 %
Lymphocytes Relative: 16 %
Lymphs Abs: 2.1 10*3/uL (ref 0.7–4.0)
MCH: 26.8 pg (ref 26.0–34.0)
MCHC: 30.2 g/dL (ref 30.0–36.0)
MCV: 88.6 fL (ref 80.0–100.0)
Monocytes Absolute: 0.7 10*3/uL (ref 0.1–1.0)
Monocytes Relative: 5 %
Neutro Abs: 10.1 10*3/uL — ABNORMAL HIGH (ref 1.7–7.7)
Neutrophils Relative %: 77 %
Platelets: 708 10*3/uL — ABNORMAL HIGH (ref 150–400)
RBC: 2.99 MIL/uL — ABNORMAL LOW (ref 3.87–5.11)
RDW: 13.7 % (ref 11.5–15.5)
WBC: 13.1 10*3/uL — ABNORMAL HIGH (ref 4.0–10.5)
nRBC: 0 % (ref 0.0–0.2)

## 2018-10-16 LAB — RENAL FUNCTION PANEL
Albumin: 2.1 g/dL — ABNORMAL LOW (ref 3.5–5.0)
Anion gap: 6 (ref 5–15)
BUN: 36 mg/dL — ABNORMAL HIGH (ref 8–23)
CO2: 22 mmol/L (ref 22–32)
Calcium: 8.2 mg/dL — ABNORMAL LOW (ref 8.9–10.3)
Chloride: 110 mmol/L (ref 98–111)
Creatinine, Ser: 1.83 mg/dL — ABNORMAL HIGH (ref 0.44–1.00)
GFR calc Af Amer: 32 mL/min — ABNORMAL LOW (ref >60)
GFR calc non Af Amer: 27 mL/min — ABNORMAL LOW (ref >60)
Glucose, Bld: 102 mg/dL — ABNORMAL HIGH (ref 70–99)
Phosphorus: 2.9 mg/dL (ref 2.5–4.6)
Potassium: 3.7 mmol/L (ref 3.5–5.1)
Sodium: 138 mmol/L (ref 135–145)

## 2018-10-16 LAB — PATHOLOGIST SMEAR REVIEW

## 2018-10-16 LAB — T3, FREE: T3, Free: 1.1 pg/mL — ABNORMAL LOW (ref 2.0–4.4)

## 2018-10-16 LAB — MAGNESIUM: Magnesium: 1.7 mg/dL (ref 1.7–2.4)

## 2018-10-16 LAB — FERRITIN: Ferritin: 608 ng/mL — ABNORMAL HIGH (ref 11–307)

## 2018-10-16 SURGERY — ESOPHAGOGASTRODUODENOSCOPY (EGD) WITH PROPOFOL
Anesthesia: Monitor Anesthesia Care

## 2018-10-16 MED ORDER — LIDOCAINE 2% (20 MG/ML) 5 ML SYRINGE
INTRAMUSCULAR | Status: DC | PRN
  Administered 2018-10-16: 60 mg via INTRAVENOUS

## 2018-10-16 MED ORDER — ACYCLOVIR 800 MG PO TABS
1000.0000 mg | ORAL_TABLET | Freq: Every day | ORAL | Status: DC
  Filled 2018-10-16 (×2): qty 0.5

## 2018-10-16 MED ORDER — VALACYCLOVIR HCL 500 MG PO TABS
1000.0000 mg | ORAL_TABLET | Freq: Two times a day (BID) | ORAL | Status: DC
  Administered 2018-10-16 – 2018-10-17 (×2): 1000 mg via ORAL
  Filled 2018-10-16 (×2): qty 2

## 2018-10-16 MED ORDER — PROPOFOL 500 MG/50ML IV EMUL
INTRAVENOUS | Status: DC | PRN
  Administered 2018-10-16: 75 ug/kg/min via INTRAVENOUS

## 2018-10-16 SURGICAL SUPPLY — 15 items

## 2018-10-16 NOTE — Care Management Important Message (Signed)
Important Message  Patient Details  Name: [name] MRN: [id number] Date of Birth: Jan 06, 1951   Medicare Important Message Given:  Yes    Arianni Gallego Montine Circle 10/16/2018, 3:46 PM

## 2018-10-16 NOTE — Interval H&P Note (Signed)
History and Physical Interval Note:  10/16/2018 4:49 PM  [name]  has presented today for surgery, with the diagnosis of Dysphagia.  Anorexia.  Oral ulcers  The various methods of treatment have been discussed with the patient and family. After consideration of risks, benefits and other options for treatment, the patient has consented to  Procedure(s): ESOPHAGOGASTRODUODENOSCOPY (EGD) WITH PROPOFOL (N/A) BALLOON DILATION (N/A) as a surgical intervention .  The patient's history has been reviewed, patient examined, no change in status, stable for surgery.  I have reviewed the patient's chart and labs.  Questions were answered to the patient's satisfaction.     Milus Banister

## 2018-10-16 NOTE — Anesthesia Preprocedure Evaluation (Addendum)
Anesthesia Evaluation  Patient identified by MRN, date of birth, ID band Patient awake    Reviewed: Allergy & Precautions, H&P , NPO status , Patient's Chart, lab work & pertinent test results, reviewed documented beta blocker date and time   Airway Mallampati: III  TM Distance: >3 FB Neck ROM: Limited   Comment: S/p C-fusion, reduced lat rotation Dental no notable dental hx.    Pulmonary neg pulmonary ROS, asthma ,  Mild Asthma, inhalers rarely.   Pulmonary exam normal breath sounds clear to auscultation       Cardiovascular Exercise Tolerance: Good hypertension, Pt. on medications and Pt. on home beta blockers negative cardio ROS   Rhythm:Regular Rate:Normal  Denies cardiac symptoms   Neuro/Psych  Headaches,  Neuromuscular disease negative neurological ROS  negative psych ROS   GI/Hepatic negative GI ROS, Neg liver ROS,   Endo/Other  negative endocrine ROSHypothyroidism Hyperthyroidism Hx Hyperthyroid/Graves Dz. Radioactive Iodine. Now on replacement  Renal/GU CRFRenal diseasenegative Renal ROSkiney stone   negative genitourinary   Musculoskeletal negative musculoskeletal ROS (+)   Abdominal   Peds negative pediatric ROS (+)  Hematology negative hematology ROS (+) Blood dyscrasia, anemia ,   Anesthesia Other Findings   Reproductive/Obstetrics negative OB ROS                           Anesthesia Physical  Anesthesia Plan  ASA: II  Anesthesia Plan: MAC   Post-op Pain Management:    Induction: Intravenous  PONV Risk Score and Plan: 2 and Treatment may vary due to age or medical condition  Airway Management Planned: Nasal Cannula, Natural Airway and Mask  Additional Equipment:   Intra-op Plan:   Post-operative Plan:   Informed Consent: I have reviewed the patients History and Physical, chart, labs and discussed the procedure including the risks, benefits and alternatives for  the proposed anesthesia with the patient or authorized representative who has indicated his/her understanding and acceptance.   Dental Advisory Given  Plan Discussed with: CRNA, Surgeon and Anesthesiologist  Anesthesia Plan Comments:        Anesthesia Quick Evaluation

## 2018-10-16 NOTE — Progress Notes (Signed)
PROGRESS NOTE    [name]  OJJ:[id number] DOB: 1951/06/28 DOA: 10/14/2018 PCP: Abner Greenspan, MD  Outpatient Specialists:   Brief Narrative:  [name]  is a  67 year old female, with past medical history significant for chronic UTIs, bladder abnormalities, ureteral calculi status post cystoscopy and stone extraction with basket in 2012, colonic polyp, hypertension, mixed incontinence, migraine, renal cyst, hyperthyroidism (Grave's disease)/hypothyroidism (on synthroid), asthma, hyperlipidemia, anemia, CKD stage III and hiatal hernia.  According to the patient, her problems started about 3 weeks ago with what appeared to be URI/viral syndrome.  Over the last 2 weeks, patient has developed significant poor appetite, 15 pound weight loss, dysphagia for solids, generalized weakness and fatigue and urinary frequency.  Specifically, patient denied fever, chills, nausea or vomiting, abdominal pain, dysuria.  Patient has never had an EGD.  Patient had colonoscopy 3 to 4 months ago done by [facility], and polyps were found.  On presentation to the hospital, urinalysis done was suggestive of possible UTI.  There is documentation of UTI/sepsis on presentation.  Patient is currently on IV Rocephin.  Will consult GI team as the patient may need EGD.  Will also start patient on Megace.  Low potassium (3.1), worsening renal function, with serum creatinine of 1.88 were noted.  Low potassium will be repleted.  Will hold HCTZ and irbesartan due to worsening renal function and hypokalemia.  Blood pressure is in the low normal range, will continue to monitor off of anti-hypertensive medications.  Will also check TSH and free T4.  Psychomotor retardation, depressed facie with no reactivity were noted, but patient denied being depressed.  Patient's husband reported prior history of depression when patient had active thyroid problems.  Further management will depend on hospital course.  10/16/2018: No new changes.   Patient continues to report odynophagia, dysphagia and poor appetite.  For EGD later today.  Further management will depend on above.  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  Dysphagia for solids: Consult GI. Likely EGD later today.  UTI (with documented sepsis): Urine culture is growing E. coli.   Follow final sensitivity.   Continue IV antibiotics.  Anorexia/weight loss: In the setting of dysphagia, and patient's age, this is worrisome. Consult GI for EGD. Low threshold to introduce Megace. Further management depend on hospital course.  Hypokalemia: Discontinue HCTZ. Potassium is 3.7 today.   Acute kidney injury on chronic kidney disease: Serum creatinine is 1.83 today. Continue to monitor  Thyroid disease: Check TSH and free T4  Depressed affect: Rule out underlying depressive illness. We will continue to monitor during the hospital stay.  Thrombocytosis: Continue to monitor. Platelet count is down to 708 today.   CBC in the morning. Further management will depend on the trend.  Hyponatremia: Resolved.   Sodium is 138 today.   Possibly pre renal. Patient was on HCTZ  DVT prophylaxis: Heparin Code Status: Full code Family Communication: Husband Disposition Plan: Home eventually.  Consultants:   GI team  Procedures:   Likely EGD later today.   Antimicrobials:   IV Rocephin   Subjective: Patient continues to report poor appetite and dysphagia. No fever or chills. Patient has urinary frequency, but not dysuria.  Objective: Vitals:   10/16/18 0933 10/16/18 1445 10/16/18 1709 10/16/18 1734  BP: 118/75 134/71 121/64 (!) 125/100  Pulse: 96 (!) 101 97 95  Resp:  18 (!) 25 (!) 24 19  Temp: 98.7 F (37.1 C) 97.8 F (36.6 C) 98.3 F (36.8 C) 98.3 F (36.8 C)  TempSrc: Oral Oral Oral Oral  SpO2: 98% 97% 97% 98%  Weight:         Intake/Output Summary (Last 24 hours) at 10/16/2018 1801 Last data filed at 10/16/2018 1200 Gross per 24 hour  Intake 604.07 ml  Output 2000 ml  Net -1395.93 ml   Filed Weights   10/14/18 2000 10/15/18 1955  Weight: 80.6 kg 77.3 kg    Examination:  General exam: Appears calm and comfortable  Respiratory system: Clear to auscultation. Respiratory effort normal. Cardiovascular system: S1 & S2. No pedal edema. Gastrointestinal system: Abdomen is obese, soft and nontender. No organomegaly or masses felt. Normal bowel sounds heard. Central nervous system: Alert and oriented. No focal neurological deficits. Extremities: No leg edema  Psychiatry: Judgement and insight appear normal. Mood & affect appropriate.     Data Reviewed: I have personally reviewed following labs and imaging studies  CBC: Recent Labs  Lab 10/14/18 1139 10/16/18 0545  WBC 13.2* 13.1*  NEUTROABS 9.4* 10.1*  HGB 10.6* 8.0*  HCT 34.7* 26.5*  MCV 87.6 88.6  PLT 1,033* 568*   Basic Metabolic Panel: Recent Labs  Lab 10/14/18 1139 10/15/18 0508 10/16/18 0545  NA 128* 133* 138  K 3.3* 3.1* 3.7  CL 93* 105 110  CO2 18* 20* 22  GLUCOSE 123* 133* 102*  BUN 74* 54* 36*  CREATININE 2.15* 1.88* 1.83*  CALCIUM 9.5 8.2* 8.2*  MG  --   --  1.7  PHOS  --   --  2.9   GFR: Estimated Creatinine Clearance: 31.3 mL/min (A) (by C-G formula based on SCr of 1.83 mg/dL (H)). Liver Function Tests: Recent Labs  Lab 10/14/18 1139 10/16/18 0545  AST 18  --   ALT 25  --   ALKPHOS 121  --   BILITOT 0.7  --   PROT 7.2  --   ALBUMIN 2.8* 2.1*   No results for input(s): LIPASE, AMYLASE in the last 168 hours. No results for input(s): AMMONIA in the last 168 hours. Coagulation Profile: No results for input(s): INR, PROTIME in the last 168 hours. Cardiac Enzymes: No results for input(s): CKTOTAL, CKMB, CKMBINDEX, TROPONINI in the last 168 hours. BNP (last 3 results) No results for input(s): PROBNP in the  last 8760 hours. HbA1C: No results for input(s): HGBA1C in the last 72 hours. CBG: No results for input(s): GLUCAP in the last 168 hours. Lipid Profile: No results for input(s): CHOL, HDL, LDLCALC, TRIG, CHOLHDL, LDLDIRECT in the last 72 hours. Thyroid Function Tests: Recent Labs    10/15/18 1246  TSH 3.568  FREET4 1.19  T3FREE 1.1*   Anemia Panel: Recent Labs    10/15/18 1246 10/16/18 0545  FERRITIN  --  608*  TIBC 218*  --   IRON 12*  --    Urine analysis:    Component Value Date/Time   COLORURINE YELLOW 10/14/2018 1324   APPEARANCEUR CLOUDY (A) 10/14/2018 1324   LABSPEC 1.005 10/14/2018 1324   PHURINE 6.0 10/14/2018 1324   GLUCOSEU NEGATIVE 10/14/2018 1324   HGBUR MODERATE (A) 10/14/2018 1324   BILIRUBINUR NEGATIVE 10/14/2018 1324   BILIRUBINUR Negative 02/24/2018 1129   KETONESUR NEGATIVE 10/14/2018 1324   PROTEINUR NEGATIVE 10/14/2018 1324   UROBILINOGEN 0.2 02/24/2018 1129   NITRITE NEGATIVE 10/14/2018 1324   LEUKOCYTESUR LARGE (A) 10/14/2018 1324   Sepsis Labs: @LABRCNTIP (procalcitonin:4,lacticidven:4)  )  Recent Results (from the past 240 hour(s))  Blood Culture (routine x 2)     Status: None (Preliminary result)   Collection Time: 10/14/18 12:47 PM  Result Value Ref Range Status   Specimen Description BLOOD LEFT ANTECUBITAL  Final   Special Requests   Final    BOTTLES DRAWN AEROBIC AND ANAEROBIC Blood Culture results may not be optimal due to an inadequate volume of blood received in culture bottles   Culture   Final    NO GROWTH 2 DAYS Performed at [facility] [address], [place], [place] [address]    Report Status PENDING  Incomplete  Blood Culture (routine x 2)     Status: None (Preliminary result)   Collection Time: 10/14/18 12:48 PM  Result Value Ref Range Status   Specimen Description BLOOD RIGHT HAND  Final   Special Requests   Final    BOTTLES DRAWN AEROBIC AND ANAEROBIC Blood Culture adequate volume   Culture   Final    NO  GROWTH 2 DAYS Performed at [facility], [place] [address] [place], [place] [address]    Report Status PENDING  Incomplete  Urine culture     Status: Abnormal (Preliminary result)   Collection Time: 10/14/18  1:24 PM  Result Value Ref Range Status   Specimen Description URINE, CLEAN CATCH  Final   Special Requests   Final    NONE Performed at [facility], [place] [address], [place], [place] [address]    Culture >=100,000 COLONIES/mL ESCHERICHIA COLI (A)  Final   Report Status PENDING  Incomplete         Radiology Studies: No results found.      Scheduled Meds: . acyclovir  1,000 mg Oral Daily  . calcium-vitamin D  2 tablet Oral Daily  . feeding supplement (ENSURE ENLIVE)  237 mL Oral QID  . fenofibrate  160 mg Oral Daily  . levothyroxine  125 mcg Oral QAC breakfast  . liothyronine  12.5 mcg Oral Daily  . mirabegron ER  50 mg Oral Daily  . montelukast  10 mg Oral Daily  . multivitamin with minerals  1 tablet Oral Daily  . propranolol ER  120 mg Oral Daily  . vitamin B-12  500 mcg Oral Daily   Continuous Infusions: . sodium chloride 0 mL/hr at 10/15/18 2141  . cefTRIAXone (ROCEPHIN)  IV 1 g (10/16/18 1031)     LOS: 2 days    Time spent: 67 Minutes    Dana Allan, MD  [facility] Pager #: [phone] 7PM-7AM contact night coverage as above

## 2018-10-16 NOTE — Anesthesia Postprocedure Evaluation (Signed)
Anesthesia Post Note  Patient: [name]  Procedure(s) Performed: ESOPHAGOGASTRODUODENOSCOPY (EGD) WITH PROPOFOL (N/A ) BIOPSY     Patient location during evaluation: PACU Anesthesia Type: MAC Level of consciousness: awake and alert Pain management: pain level controlled Vital Signs Assessment: post-procedure vital signs reviewed and stable Respiratory status: spontaneous breathing, nonlabored ventilation, respiratory function stable and patient connected to nasal cannula oxygen Cardiovascular status: stable and blood pressure returned to baseline Postop Assessment: no apparent nausea or vomiting Anesthetic complications: no    Last Vitals:  Vitals:   10/16/18 1709 10/16/18 1734  BP: 121/64 (!) 125/100  Pulse: 97 95  Resp: (!) 24 19  Temp: 36.8 C 36.8 C  SpO2: 97% 98%    Last Pain:  Vitals:   10/16/18 1734  TempSrc: Oral  PainSc:                  Effie Berkshire

## 2018-10-16 NOTE — Op Note (Signed)
[facility] Patient Name: [name] Procedure Date : 10/16/2018 MRN: [id number] Attending MD: Milus Banister , MD Date of Birth: [birth year] CSN: [id number] Age: 67 Admit Type: Inpatient Procedure:                Upper GI endoscopy Indications:              Dysphagia, Odynophagia Providers:                Milus Banister, MD, Zenon Mayo, RN, Cletis Athens, Technician Referring MD:              Medicines:                Monitored Anesthesia Care Complications:            No immediate complications. Estimated blood loss:                            None. Estimated Blood Loss:     Estimated blood loss: none. Procedure:                Pre-Anesthesia Assessment:                           - Prior to the procedure, a History and Physical                            was performed, and patient medications and                            allergies were reviewed. The patient's tolerance of                            previous anesthesia was also reviewed. The risks                            and benefits of the procedure and the sedation                            options and risks were discussed with the patient.                            All questions were answered, and informed consent                            was obtained. Prior Anticoagulants: The patient has                            taken no previous anticoagulant or antiplatelet                            agents. ASA Grade Assessment: II - A patient with                            mild  systemic disease. After reviewing the risks                            and benefits, the patient was deemed in                            satisfactory condition to undergo the procedure.                           After obtaining informed consent, the endoscope was                            passed under direct vision. Throughout the                            procedure, the patient's blood pressure, pulse,  and                            oxygen saturations were monitored continuously. The                            GIF-H190 ([id number]) Olympus adult EGD was introduced                            through the mouth, and advanced to the second part                            of duodenum. The upper GI endoscopy was                            accomplished without difficulty. The patient                            tolerated the procedure well. Scope In: Scope Out: Findings:      There were several clean based ulcers throughout her esophagus. Some       were round, some were linear. The largest linear ulcer was about 4cm       long. They are not typical appearing for acid related damage. Several       biopsies taken, sent for histology and viral culture.      The exam was otherwise without abnormality. Impression:               - Muliple esophageal ulcers. Likely viral etiology.                            Biopsies taken and sent for histology and viral                            culture. Recommendation:           - Return patient to hospital ward for ongoing care.                           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 Will increase that to 1000mg  daily now.                           - Hospitalist to consider ID input pending clinical                            course. Biopsies will take several days to return                            and I will contact her with the results.                           - Please call, page with any further questions or                            concerns. Procedure Code(s):        --- Professional ---                           [phone], Esophagogastroduodenoscopy, flexible,                            transoral; with biopsy, single or multiple Diagnosis Code(s):        --- Professional ---                           K22.10, 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                           R13.10, Dysphagia,  unspecified CPT copyright 2018 [employer]. All rights reserved. The codes documented in this report are preliminary and upon coder review may  be revised to meet current compliance requirements. Milus Banister, MD 10/16/2018 5:14:20 PM This report has been signed electronically. Number of Addenda: 0

## 2018-10-16 NOTE — Transfer of Care (Signed)
Immediate Anesthesia Transfer of Care Note  Patient: [name]  Procedure(s) Performed: ESOPHAGOGASTRODUODENOSCOPY (EGD) WITH PROPOFOL (N/A ) BALLOON DILATION (N/A )  Patient Location: Endoscopy Unit  Anesthesia Type:MAC  Level of Consciousness: awake  Airway & Oxygen Therapy: Patient Spontanous Breathing  Post-op Assessment: Report given to RN  Post vital signs: Reviewed and stable  Last Vitals:  Vitals Value Taken Time  BP 121/64 10/16/2018  5:09 PM  Temp    Pulse 97 10/16/2018  5:09 PM  Resp 24 10/16/2018  5:09 PM  SpO2 98 % 10/16/2018  5:09 PM  Vitals shown include unvalidated device data.  Last Pain:  Vitals:   10/16/18 1445  TempSrc: Oral  PainSc: 0-No pain         Complications: No apparent anesthesia complications

## 2018-10-16 NOTE — Anesthesia Procedure Notes (Signed)
Procedure Name: MAC Date/Time: 10/16/2018 4:42 PM Performed by: Barrington Ellison, CRNA Pre-anesthesia Checklist: Patient identified, Emergency Drugs available, Suction available, Patient being monitored and Timeout performed Patient Re-evaluated:Patient Re-evaluated prior to induction Oxygen Delivery Method: Nasal cannula

## 2018-10-17 ENCOUNTER — Encounter (HOSPITAL_COMMUNITY): Payer: Self-pay | Admitting: Gastroenterology

## 2018-10-17 LAB — CBC WITH DIFFERENTIAL/PLATELET
Abs Immature Granulocytes: 0.08 10*3/uL — ABNORMAL HIGH (ref 0.00–0.07)
Basophils Absolute: 0 10*3/uL (ref 0.0–0.1)
Basophils Relative: 0 %
Eosinophils Absolute: 0.1 10*3/uL (ref 0.0–0.5)
Eosinophils Relative: 1 %
HCT: 25.1 % — ABNORMAL LOW (ref 36.0–46.0)
Hemoglobin: 7.5 g/dL — ABNORMAL LOW (ref 12.0–15.0)
Immature Granulocytes: 1 %
Lymphocytes Relative: 23 %
Lymphs Abs: 2.3 10*3/uL (ref 0.7–4.0)
MCH: 27.1 pg (ref 26.0–34.0)
MCHC: 29.9 g/dL — ABNORMAL LOW (ref 30.0–36.0)
MCV: 90.6 fL (ref 80.0–100.0)
Monocytes Absolute: 0.5 10*3/uL (ref 0.1–1.0)
Monocytes Relative: 5 %
Neutro Abs: 6.9 10*3/uL (ref 1.7–7.7)
Neutrophils Relative %: 70 %
Platelets: 628 10*3/uL — ABNORMAL HIGH (ref 150–400)
RBC: 2.77 MIL/uL — ABNORMAL LOW (ref 3.87–5.11)
RDW: 13.8 % (ref 11.5–15.5)
WBC: 10 10*3/uL (ref 4.0–10.5)
nRBC: 0 % (ref 0.0–0.2)

## 2018-10-17 LAB — RENAL FUNCTION PANEL
Albumin: 1.9 g/dL — ABNORMAL LOW (ref 3.5–5.0)
Anion gap: 5 (ref 5–15)
BUN: 28 mg/dL — ABNORMAL HIGH (ref 8–23)
CO2: 23 mmol/L (ref 22–32)
Calcium: 8 mg/dL — ABNORMAL LOW (ref 8.9–10.3)
Chloride: 110 mmol/L (ref 98–111)
Creatinine, Ser: 1.59 mg/dL — ABNORMAL HIGH (ref 0.44–1.00)
GFR calc Af Amer: 38 mL/min — ABNORMAL LOW (ref >60)
GFR calc non Af Amer: 33 mL/min — ABNORMAL LOW (ref >60)
Glucose, Bld: 98 mg/dL (ref 70–99)
Phosphorus: 2.3 mg/dL — ABNORMAL LOW (ref 2.5–4.6)
Potassium: 3.7 mmol/L (ref 3.5–5.1)
Sodium: 138 mmol/L (ref 135–145)

## 2018-10-17 LAB — URINE CULTURE: Culture: >=100000 — AB

## 2018-10-17 LAB — MAGNESIUM: Magnesium: 1.5 mg/dL — ABNORMAL LOW (ref 1.7–2.4)

## 2018-10-17 MED ORDER — ENSURE ENLIVE PO LIQD: 237.0000 mL | Freq: Four times a day (QID) | ORAL | 12 refills | Status: DC

## 2018-10-17 MED ORDER — VALACYCLOVIR HCL 1 G PO TABS: 1000.0000 mg | ORAL_TABLET | Freq: Two times a day (BID) | ORAL | 0 refills | Status: DC

## 2018-10-17 MED ORDER — MAGNESIUM SULFATE 2 GM/50ML IV SOLN
2.0000 g | Freq: Once | INTRAVENOUS | Status: AC
  Administered 2018-10-17: 2 g via INTRAVENOUS
  Filled 2018-10-17: qty 50

## 2018-10-19 LAB — CULTURE, BLOOD (ROUTINE X 2)
CULTURE: NO GROWTH
Culture: NO GROWTH
SPECIAL REQUESTS: ADEQUATE

## 2018-10-20 ENCOUNTER — Telehealth: Payer: Self-pay

## 2018-10-20 NOTE — Telephone Encounter (Signed)
Transition Care Management Follow-up Telephone Call   Date discharged? 10/16/18   How have you been since you were released from the hospital? Yes, I am almost back to normal.    Do you understand why you were in the hospital? Yes   Do you understand the discharge instructions? Yes   Where were you discharged to? Home   Items Reviewed:  Medications reviewed: Yes  Allergies reviewed: Yes  Dietary changes reviewed: Yes   Referrals reviewed: No follow ups planned for specialists.    Functional Questionnaire:   Activities of Daily Living (ADLs):   She states they are independent in the following: dressing, bathing, grooming, toileting ambulation, feeding  States they require assistance with the following: does not require any ADL assistance   Any transportation issues/concerns?: no   Any patient concerns? None   Confirmed importance and date/time of follow-up visits scheduled: Yes  Provider Appointment booked with Dr. Glori Bickers on 10/23/18   Confirmed with patient if condition begins to worsen call PCP or go to the ER.  Patient was given the office number and encouraged to call back with question or concerns.  : Yes

## 2018-10-23 ENCOUNTER — Ambulatory Visit (INDEPENDENT_AMBULATORY_CARE_PROVIDER_SITE_OTHER): Payer: Medicare Other | Admitting: Family Medicine

## 2018-10-23 ENCOUNTER — Encounter: Payer: Self-pay | Admitting: Family Medicine

## 2018-10-23 VITALS — BP 118/78 | HR 81 | Temp 98.1°F | Ht 66.0 in | Wt 159.0 lb

## 2018-10-23 DIAGNOSIS — D649 Anemia, unspecified: Secondary | ICD-10-CM | POA: Diagnosis not present

## 2018-10-23 DIAGNOSIS — I10 Essential (primary) hypertension: Secondary | ICD-10-CM | POA: Diagnosis not present

## 2018-10-23 DIAGNOSIS — N3000 Acute cystitis without hematuria: Secondary | ICD-10-CM

## 2018-10-23 DIAGNOSIS — D473 Essential (hemorrhagic) thrombocythemia: Secondary | ICD-10-CM

## 2018-10-23 DIAGNOSIS — K649 Unspecified hemorrhoids: Secondary | ICD-10-CM

## 2018-10-23 DIAGNOSIS — K221 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: Secondary | ICD-10-CM

## 2018-10-23 DIAGNOSIS — R1319 Other dysphagia: Secondary | ICD-10-CM

## 2018-10-23 DIAGNOSIS — N289 Disorder of kidney and ureter, unspecified: Secondary | ICD-10-CM | POA: Insufficient documentation

## 2018-10-23 DIAGNOSIS — B009 Herpesviral infection, unspecified: Secondary | ICD-10-CM

## 2018-10-23 DIAGNOSIS — N390 Urinary tract infection, site not specified: Secondary | ICD-10-CM | POA: Insufficient documentation

## 2018-10-23 DIAGNOSIS — D75839 Thrombocytosis, unspecified: Secondary | ICD-10-CM

## 2018-10-23 MED ORDER — VALACYCLOVIR HCL 1 G PO TABS: 1000.0000 mg | ORAL_TABLET | Freq: Two times a day (BID) | ORAL | 1 refills | Status: DC

## 2018-10-23 NOTE — Patient Instructions (Addendum)
Blood pressure is good - do not start the Avalide   Continue the valtrex   If no further improvement - I would consider a referral to infectious disease   For constipation - colace and or miralax (store brand) and lots of fluids   We will refer you to GI (for hemorrhoids and esophageal ulcers)   Urinalysis on the way out  Labs today -kidney function /chemisties and cbc   If you fell like you need physical therapy please let me know

## 2018-10-23 NOTE — Progress Notes (Signed)
Subjective:    Patient ID: [name], female    DOB: 09-20-1951, 67 y.o.   MRN: [id number]  HPI  Here for f/u of hospitalization from 11/6 to 10/17/18 for sepsis and esophageal ulcer  She presented with c/o uri symptoms as well as dysphagia for solids/ frequent urination/ fatigue and wt loss  Was hypotensive and tachycardic at admit  Anemic  On admit-had pos uti Also low K Cr of 1.88   Last labs 11/9 Blood cultures neg E coli in urine  Lab Results  Component Value Date   CREATININE 1.59 (H) 10/17/2018   BUN 28 (H) 10/17/2018   NA 138 10/17/2018   K 3.7 10/17/2018   CL 110 10/17/2018   CO2 23 10/17/2018   Lab Results  Component Value Date   ALT 25 10/14/2018   AST 18 10/14/2018   ALKPHOS 121 10/14/2018   BILITOT 0.7 10/14/2018   Lab Results  Component Value Date   WBC 10.0 10/17/2018   HGB 7.5 (L) 10/17/2018   HCT 25.1 (L) 10/17/2018   MCV 90.6 10/17/2018   PLT 628 (H) 10/17/2018   ? Reactive thrombocytosis  EGD showed esoph ulcers (poss viral)  Her sup dose of acyclovir was inc to 1000 mg daily   Lab Results  Component Value Date   TSH 3.568 10/15/2018      Now Wt Readings from Last 3 Encounters:  10/23/18 159 lb (72.1 kg)  10/15/18 170 lb 6.7 oz (77.3 kg)  05/19/18 179 lb (81.2 kg)  25.66 kg/m   The valtrex 1000 mg bid- helping  Still having a lot of cold sores  Does not have a GI doctor f/u   Concern- every time she gets another virus - gets cold sores and esophageal problems  Drinking lots of fluids   Getting appetite back now Able to swallow   Some frequency of urination  No burning  No blood  Drinking lots of water   bp is stable today  No cp or palpitations or headaches or edema  No side effects to medicines  BP Readings from Last 3 Encounters:  10/23/18 118/78  10/17/18 129/80  05/19/18 120/77     No heartburn or indigestion   Has hemorrhoids - worse since constipation  Needs hemorrhoid surgery    Doing much better   Getting strength back - a little wobbly  Does not feel like she needs PT   Patient Active Problem List   Diagnosis Date Noted  . UTI (urinary tract infection) 10/23/2018  . Hemorrhoids 10/23/2018  . Renal insufficiency 10/23/2018  . 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 Other dysphagia   . Sepsis ([place]) 10/14/2018  . Elevated serum creatinine 04/19/2018  . Estrogen deficiency 04/15/2018  . Mild anemia 04/15/2018  . Bladder prolapse, female, 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  . Dyspepsia 04/14/2017  . Colon cancer screening 04/09/2017  . Prediabetes 03/30/2017  . Medicare annual wellness visit, initial 04/04/2016  . Other screening mammogram 12/31/2011  . Post-menopausal 12/31/2011  . Routine general medical examination at a health care facility 12/23/2011  . 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  . Hypothyroidism 11/08/2008  . HSV (herpes simplex virus) infection 09/23/2007  . Hypertriglyceridemia 09/23/2007  . Essential hypertension 09/23/2007  . ALLERGIC RHINITIS 09/23/2007  . ASTHMA 09/23/2007  . HYPOKALEMIA 04/16/2007  . LEUKOCYTOSIS NOS 04/09/2007  . Thrombocytosis ([place]) 04/09/2007   Past Medical History:  Diagnosis Date  . Allergic rhinitis   . Allergy   . Anemia  b12 def.  . Asthma    controlled w/ singulair  . Herpes simplex without mention of complication    controlled w/ meds (no current fever blisters)  . History of kidney stones   . HLD (hyperlipidemia)    borderline-  no meds  . HTN (hypertension)    echo- 2006  . Hyperthyroidism hx ptu 2006   takes synthroid; had Graves disease, was treated for that- now thyroid doesn't work  . Leukocytosis, unspecified hx of 10 yrs ago   no problem since  . Migraine   . Mixed incontinence    urge and stress incontinence  . Neuromuscular disorder ([place])    hiatal hernia  . Renal cyst right    pt is unsure of this, has had kidney stones  . Ureteral calculi right    s/p laser  litho w/ stone extraction 01-28-11    Past Surgical History:  Procedure Laterality Date  . BIOPSY  10/16/2018   Procedure: BIOPSY;  Surgeon: Milus Banister, MD;  Location: [facility] ENDOSCOPY;  Service: Endoscopy;;  . [place]  . CERVICAL FUSION  2000   fusion c4-6  . CERVICAL FUSION  2005   fusion c6-7 and plate removal O9-7  . COLONOSCOPY    . CYSTOSCOPY W/ RETROGRADES  10/21/2011   Procedure: CYSTOSCOPY WITH RETROGRADE PYELOGRAM;  Surgeon: Ailene Rud, MD;  Location: [facility];  Service: Urology;  Laterality: Right;  CYSTOSCOPY RIGHT RETROGRADE, PYELOGRAM  URETEROSCOPY WITH HOLMIUM LASER AND STONE EXTRACTION  . CYSTOSCOPY/RETROGRADE/URETEROSCOPY/STONE EXTRACTION WITH BASKET  01-28-11   right   . ESOPHAGOGASTRODUODENOSCOPY (EGD) WITH PROPOFOL N/A 10/16/2018   Procedure: ESOPHAGOGASTRODUODENOSCOPY (EGD) WITH PROPOFOL;  Surgeon: Milus Banister, MD;  Location: [facility] ENDOSCOPY;  Service: Endoscopy;  Laterality: N/A;  . REDUCTION MAMMAPLASTY Bilateral 1995  . TONSILLECTOMY  1960  . VAGINAL HYSTERECTOMY  1987   fibroids/anemia   Social History   Tobacco Use  . Smoking status: Never Smoker  . Smokeless tobacco: Never Used  Substance Use Topics  . Alcohol use: No    Alcohol/week: 0.0 standard drinks  . Drug use: No   Family History  Problem Relation Age of Onset  . Heart failure Father   . Osteoporosis Mother   . Diabetes Mother   . Diabetes Unknown        Grandfather  . Coronary artery disease Unknown        Grandmother  . Breast cancer Neg Hx   . Colon cancer Neg Hx   . Esophageal cancer Neg Hx   . Stomach cancer Neg Hx   . Rectal cancer Neg Hx    Allergies  Allergen Reactions  . Contrast Media [Iodinated Diagnostic Agents] Anaphylaxis  . Shellfish-Derived Products Anaphylaxis  . Septra [Sulfamethoxazole-Trimethoprim]     Nausea    Current Outpatient Medications on File Prior to Visit  Medication Sig Dispense Refill  . calcium-vitamin D (OSCAL WITH D) 500-200  MG-UNIT per tablet Take 2 tablets by mouth daily.     . feeding supplement, ENSURE ENLIVE, (ENSURE ENLIVE) LIQD Take 237 mLs by mouth 4 (four) times daily. 237 mL 12  . fenofibrate 160 MG tablet TAKE 1 TABLET BY MOUTH  DAILY (Patient taking differently: Take 160 mg by mouth daily. ) 90 tablet 3  . liothyronine (CYTOMEL) 25 MCG tablet Take 0.5 tablets (12.5 mcg total) by mouth every evening. Needs generic (Patient taking differently: Take 12.5 mcg by mouth daily. Needs generic) 90 tablet 3  .  mirabegron ER (MYRBETRIQ) 50 MG TB24 tablet Take 1 tablet (50 mg total) by mouth daily. 90 tablet 3  . montelukast (SINGULAIR) 10 MG tablet Take 1 tablet (10 mg total) by mouth daily. 90 tablet 3  . Multiple Vitamin (MULTIVITAMIN) tablet Take 1 tablet by mouth daily.     . propranolol ER (INDERAL LA) 120 MG 24 hr capsule Take 1 capsule (120 mg total) by mouth daily. 90 capsule 3  . SUMAtriptan (IMITREX) 100 MG tablet TAKE 1 TABLET FOR HEADACHE MAY REPEAT ONCE IN 2 HOURS IF NEEDED. MAXIMUM OF 2    TABLETS IN ONE DAY. (Patient taking differently: Take 100 mg by mouth as needed for migraine or headache. TAKE 100 mg TABLET FOR HEADACHE MAY REPEAT ONCE IN 2 HOURS IF NEEDED. MAXIMUM OF 200 mg    TABLETS IN ONE DAY.) 27 tablet 3  . SYNTHROID 125 MCG tablet Take 1 tablet (125 mcg total) by mouth every morning. 90 tablet 3  . vitamin B-12 (CYANOCOBALAMIN) 500 MCG tablet Take 500 mcg by mouth daily.       No current facility-administered medications on file prior to visit.     Review of Systems  Constitutional: Positive for fatigue. Negative for activity change, appetite change, fever and unexpected weight change.       Improving fatigue   HENT: Negative for congestion, ear pain, rhinorrhea, sinus pressure and sore throat.   Eyes: Negative for pain, redness and visual disturbance.  Respiratory: Negative for cough, shortness of breath and wheezing.   Cardiovascular: Negative for chest pain and palpitations.    Gastrointestinal: Negative for abdominal pain, blood in stool, constipation and diarrhea.       Dysphagia is resolved   Endocrine: Negative for polydipsia and polyuria.  Genitourinary: Negative for difficulty urinating, dysuria, flank pain, frequency, hematuria and urgency.  Musculoskeletal: Negative for arthralgias, back pain and myalgias.  Skin: Negative for pallor and rash.       Cold sores on lips   Allergic/Immunologic: Negative for environmental allergies.  Neurological: Negative for dizziness, syncope and headaches.  Hematological: Negative for adenopathy. Does not bruise/bleed easily.  Psychiatric/Behavioral: Negative for decreased concentration and dysphoric mood. The patient is not nervous/anxious.        Objective:   Physical Exam  Constitutional: She appears well-developed and well-nourished. No distress.  Well appearing - in wheelchair (but able to walk) Wt loss noted  HENT:  Head: Normocephalic and atraumatic.  Mouth/Throat: Oropharynx is clear and moist.  Healing cold sores on lips Mouth is clear   Eyes: Pupils are equal, round, and reactive to light. Conjunctivae and EOM are normal. Right eye exhibits no discharge. Left eye exhibits no discharge. No scleral icterus.  Neck: Normal range of motion. Neck supple. No JVD present. Carotid bruit is not present. No tracheal deviation present. No thyromegaly present.  Cardiovascular: Normal rate, regular rhythm, normal heart sounds and intact distal pulses. Exam reveals no gallop.  Pulmonary/Chest: Effort normal and breath sounds normal. No stridor. No respiratory distress. She has no wheezes. She has no rales. She exhibits no tenderness.  No crackles  Abdominal: Soft. Bowel sounds are normal. She exhibits no distension, no abdominal bruit and no mass. There is no tenderness. There is no rebound and no guarding.  Musculoskeletal: She exhibits no edema or deformity.  Lymphadenopathy:    She has no cervical adenopathy.   Neurological: She is alert. She has normal reflexes. She displays normal reflexes. No cranial nerve deficit. She exhibits normal muscle  tone. Coordination normal.  Skin: Skin is warm and dry. Capillary refill takes less than 2 seconds. No rash noted. No erythema.  Nl color and turgor  Psychiatric: She has a normal mood and affect.  Pleasant  Mood is good           Assessment & Plan:   Problem List Items Addressed This Visit      Cardiovascular and Mediastinum   Essential hypertension    Hypotensive with hosp for urosepsis - now back to normal BP: 118/78  Feels good as well       Relevant Orders   Renal function panel (Completed)   Hemorrhoids    With discomfort Pt req ref to GI to disc opt for tx       Relevant Orders   Ambulatory referral to Gastroenterology     Digestive   Other dysphagia    Reviewed hospital records, lab results and studies in detail   Had esophageal ulcers (suspect from HSV) Much clinical improvement on valtrex 1 g bid  Will schedule GI appt for hosp f/u as well Pt denies gerd symptoms       Relevant Orders   Ambulatory referral to Gastroenterology   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 - Primary    On EGD in hospital  Reviewed hospital records, lab results and studies in detail   Thought to be poss due to HSV (severe cold sores on mouth as well)  Much improved clinically Continue high dose valtrex  Ref to GI for hosp f/u      Relevant Orders   Ambulatory referral to Gastroenterology   CBC with Differential/Platelet (Completed)     Genitourinary   Renal insufficiency    Cr up in hospital- improved with tx of her uti  Lab today for renal panel      UTI (urinary tract infection)    Reviewed hospital records, lab results and studies in detail  Much clinical improvement  Renal panel today       Relevant Medications   valACYclovir (VALTREX) 1000 MG tablet     Hematopoietic and Hemostatic   Thrombocytosis ([place])    Suspect  reactive in hospital with sepsis Reviewed hospital records, lab results and studies in detail   Much clinical improvement  Labs today        Other   HSV (herpes simplex virus) infection    With cold sores/labial and also esophageal ulcers  Reviewed hospital records, lab results and studies in detail   Much clinical improvement Continue valtrex 1000 mg bid       Relevant Medications   valACYclovir (VALTREX) 1000 MG tablet   Mild anemia    Improved at hosp d/c   Had esoph ulcers and also renal insufficiency Reviewed hospital records, lab results and studies in detail   Cbc today       Relevant Orders   CBC with Differential/Platelet (Completed)

## 2018-10-24 LAB — CBC WITH DIFFERENTIAL/PLATELET
BASOS ABS: 77 {cells}/uL (ref 0–200)
BASOS PCT: 1.1 %
EOS PCT: 1.9 %
Eosinophils Absolute: 133 cells/uL (ref 15–500)
HEMATOCRIT: 30.1 % — AB (ref 35.0–45.0)
HEMOGLOBIN: 9.9 g/dL — AB (ref 11.7–15.5)
LYMPHS ABS: 2415 {cells}/uL (ref 850–3900)
MCH: 27.3 pg (ref 27.0–33.0)
MCHC: 32.9 g/dL (ref 32.0–36.0)
MCV: 83.1 fL (ref 80.0–100.0)
MONOS PCT: 7.6 %
MPV: 9 fL (ref 7.5–12.5)
NEUTROS ABS: 3843 {cells}/uL (ref 1500–7800)
Neutrophils Relative %: 54.9 %
Platelets: 793 10*3/uL — ABNORMAL HIGH (ref 140–400)
RBC: 3.62 10*6/uL — AB (ref 3.80–5.10)
RDW: 13.1 % (ref 11.0–15.0)
Total Lymphocyte: 34.5 %
WBC mixed population: 532 cells/uL (ref 200–950)
WBC: 7 10*3/uL (ref 3.8–10.8)

## 2018-10-24 LAB — RENAL FUNCTION PANEL
Albumin: 3.9 g/dL (ref 3.6–5.1)
BUN/Creatinine Ratio: 17 (calc) (ref 6–22)
BUN: 33 mg/dL — AB (ref 7–25)
CO2: 23 mmol/L (ref 20–32)
CREATININE: 1.89 mg/dL — AB (ref 0.50–0.99)
Calcium: 10 mg/dL (ref 8.6–10.4)
Chloride: 100 mmol/L (ref 98–110)
Glucose, Bld: 103 mg/dL — ABNORMAL HIGH (ref 65–99)
PHOSPHORUS: 4.4 mg/dL — AB (ref 2.1–4.3)
POTASSIUM: 5.8 mmol/L — AB (ref 3.5–5.3)
Sodium: 137 mmol/L (ref 135–146)

## 2018-10-25 NOTE — Assessment & Plan Note (Signed)
On EGD in hospital  Reviewed hospital records, lab results and studies in detail   Thought to be poss due to HSV (severe cold sores on mouth as well)  Much improved clinically Continue high dose valtrex  Ref to GI for hosp f/u

## 2018-10-25 NOTE — Assessment & Plan Note (Signed)
Reviewed hospital records, lab results and studies in detail  Much clinical improvement  Renal panel today

## 2018-10-25 NOTE — Assessment & Plan Note (Signed)
Hypotensive with hosp for urosepsis - now back to normal BP: 118/78  Feels good as well

## 2018-10-25 NOTE — Assessment & Plan Note (Signed)
Reviewed hospital records, lab results and studies in detail   Had esophageal ulcers (suspect from HSV) Much clinical improvement on valtrex 1 g bid  Will schedule GI appt for hosp f/u as well Pt denies gerd symptoms

## 2018-10-25 NOTE — Assessment & Plan Note (Signed)
With discomfort Pt req ref to GI to disc opt for tx

## 2018-10-25 NOTE — Assessment & Plan Note (Signed)
Suspect reactive in hospital with sepsis Reviewed hospital records, lab results and studies in detail   Much clinical improvement  Labs today

## 2018-10-25 NOTE — Assessment & Plan Note (Signed)
With cold sores/labial and also esophageal ulcers  Reviewed hospital records, lab results and studies in detail   Much clinical improvement Continue valtrex 1000 mg bid

## 2018-10-25 NOTE — Assessment & Plan Note (Signed)
Improved at hosp d/c   Had esoph ulcers and also renal insufficiency Reviewed hospital records, lab results and studies in detail   Cbc today

## 2018-10-25 NOTE — Assessment & Plan Note (Signed)
Cr up in hospital- improved with tx of her uti  Lab today for renal panel

## 2018-10-29 ENCOUNTER — Telehealth: Payer: Self-pay | Admitting: Family Medicine

## 2018-10-29 DIAGNOSIS — D473 Essential (hemorrhagic) thrombocythemia: Secondary | ICD-10-CM

## 2018-10-29 DIAGNOSIS — N289 Disorder of kidney and ureter, unspecified: Secondary | ICD-10-CM

## 2018-10-29 DIAGNOSIS — E875 Hyperkalemia: Secondary | ICD-10-CM | POA: Insufficient documentation

## 2018-10-29 DIAGNOSIS — D75839 Thrombocytosis, unspecified: Secondary | ICD-10-CM

## 2018-10-29 NOTE — Telephone Encounter (Signed)
-----   Message from Lendon Collar, RT sent at 10/28/2018  2:39 PM EST ----- Regarding: Lab orders for Friday 10/30/18 Please enter lab orders for 10/30/18. Schedule states that she is coming in for K and CBC. Thanks!

## 2018-10-30 ENCOUNTER — Other Ambulatory Visit (INDEPENDENT_AMBULATORY_CARE_PROVIDER_SITE_OTHER): Payer: Medicare Other

## 2018-10-30 DIAGNOSIS — D473 Essential (hemorrhagic) thrombocythemia: Secondary | ICD-10-CM

## 2018-10-30 DIAGNOSIS — N289 Disorder of kidney and ureter, unspecified: Secondary | ICD-10-CM | POA: Diagnosis not present

## 2018-10-30 DIAGNOSIS — D75839 Thrombocytosis, unspecified: Secondary | ICD-10-CM

## 2018-10-30 DIAGNOSIS — E875 Hyperkalemia: Secondary | ICD-10-CM | POA: Diagnosis not present

## 2018-10-30 LAB — RENAL FUNCTION PANEL
Albumin: 3.9 g/dL (ref 3.5–5.2)
BUN: 30 mg/dL — AB (ref 6–23)
CALCIUM: 8.8 mg/dL (ref 8.4–10.5)
CO2: 23 meq/L (ref 19–32)
CREATININE: 1.65 mg/dL — AB (ref 0.40–1.20)
Chloride: 103 mEq/L (ref 96–112)
GFR: 32.89 mL/min — AB (ref >60.00)
Glucose, Bld: 87 mg/dL (ref 70–99)
Phosphorus: 3.6 mg/dL (ref 2.3–4.6)
Potassium: 3.6 mEq/L (ref 3.5–5.1)
Sodium: 134 mEq/L — ABNORMAL LOW (ref 135–145)

## 2018-10-30 LAB — CBC WITH DIFFERENTIAL/PLATELET
BASOS ABS: 0.1 10*3/uL (ref 0.0–0.1)
BASOS PCT: 1 % (ref 0.0–3.0)
EOS ABS: 0.2 10*3/uL (ref 0.0–0.7)
Eosinophils Relative: 3.4 % (ref 0.0–5.0)
HCT: 30 % — ABNORMAL LOW (ref 36.0–46.0)
Hemoglobin: 9.9 g/dL — ABNORMAL LOW (ref 12.0–15.0)
Lymphocytes Relative: 31.8 % (ref 12.0–46.0)
Lymphs Abs: 2.2 10*3/uL (ref 0.7–4.0)
MCHC: 33.1 g/dL (ref 30.0–36.0)
MCV: 86.2 fl (ref 78.0–100.0)
Monocytes Absolute: 0.6 10*3/uL (ref 0.1–1.0)
Monocytes Relative: 8.4 % (ref 3.0–12.0)
NEUTROS ABS: 3.7 10*3/uL (ref 1.4–7.7)
NEUTROS PCT: 55.4 % (ref 43.0–77.0)
PLATELETS: 643 10*3/uL — AB (ref 150.0–400.0)
RBC: 3.48 Mil/uL — ABNORMAL LOW (ref 3.87–5.11)
RDW: 14.6 % (ref 11.5–15.5)
WBC: 6.8 10*3/uL (ref 4.0–10.5)

## 2018-10-31 ENCOUNTER — Encounter: Payer: Self-pay | Admitting: Family Medicine

## 2018-11-02 ENCOUNTER — Encounter: Payer: Self-pay | Admitting: Family Medicine

## 2018-11-22 ENCOUNTER — Encounter: Payer: Self-pay | Admitting: Family Medicine

## 2018-11-23 MED ORDER — VALACYCLOVIR HCL 1 G PO TABS: 1000.0000 mg | ORAL_TABLET | Freq: Two times a day (BID) | ORAL | 1 refills | Status: DC

## 2018-11-23 NOTE — Telephone Encounter (Signed)
Rx was sent to local pharmacy, I have resent it to mail order, please see pt's last part of the message regarding urinary issues

## 2018-11-24 ENCOUNTER — Encounter: Payer: Self-pay | Admitting: Nurse Practitioner

## 2018-11-24 ENCOUNTER — Ambulatory Visit: Payer: Medicare Other | Admitting: Nurse Practitioner

## 2018-11-24 ENCOUNTER — Other Ambulatory Visit (INDEPENDENT_AMBULATORY_CARE_PROVIDER_SITE_OTHER): Payer: Medicare Other

## 2018-11-24 VITALS — BP 120/72 | HR 61 | Ht 66.0 in | Wt 164.0 lb

## 2018-11-24 DIAGNOSIS — K649 Unspecified hemorrhoids: Secondary | ICD-10-CM | POA: Diagnosis not present

## 2018-11-24 DIAGNOSIS — K221 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: Secondary | ICD-10-CM

## 2018-11-24 DIAGNOSIS — R300 Dysuria: Secondary | ICD-10-CM

## 2018-11-24 DIAGNOSIS — R8290 Unspecified abnormal findings in urine: Secondary | ICD-10-CM | POA: Diagnosis not present

## 2018-11-24 LAB — POC URINALSYSI DIPSTICK (AUTOMATED)
BILIRUBIN UA: NEGATIVE
Blood, UA: 50
GLUCOSE UA: NEGATIVE
Ketones, UA: NEGATIVE
Nitrite, UA: NEGATIVE
Protein, UA: POSITIVE — AB
SPEC GRAV UA: 1.015 (ref 1.010–1.025)
Urobilinogen, UA: 0.2 E.U./dL
pH, UA: 6 (ref 5.0–8.0)

## 2018-11-24 NOTE — Patient Instructions (Signed)
If you are age 67 or older, your body mass index should be between 23-30. Your Body mass index is 26.47 kg/m. If this is out of the aforementioned range listed, please consider follow up with your Primary Care Provider.  If you are age 66 or younger, your body mass index should be between 19-25. Your Body mass index is 26.47 kg/m. If this is out of the aformentioned range listed, please consider follow up with your Primary Care Provider.   I will call you about changing back to Acyclovir.  Thank you for choosing me and [facility].   Tye Savoy, NP

## 2018-11-24 NOTE — Progress Notes (Signed)
Chief Complaint:    Hospital follow-up  IMPRESSION and PLAN:    68.  67 year old female seen in the hospital in early November for evaluation of odynophagia and dysphagia.  Inpatient EGD revealing a several esophageal ulcers.  Biopsies showed only acute on chronic inflammation/ulceration.  We still suspected a viral etiology.  Patient had been on chronic acyclovir for cold sores,  we changed that to high-dose Valtrex as the gel capsule was easier for her to swallow.  Does not appear she was given a PPI -Patient is here for follow-up.  The odynophagia and dysphagia have completely resolved.  At this point patient can probably resume her normal dose of acyclovir 400 mg daily, especially since she has a large supply of this at home.   2.  GERD, occasional heartburn relieved with Pepcid as needed.   3.  Hemorrhoids.  Having pain from hemorrhoids during hospitalization in early November.  She did not have any associated bleeding.  She inquires about hemorrhoidal banding.  Hemorrhoids are not problematic at this time -I explained that painful hemorrhoids are often external in nature which we do not band.  Sometimes however, internal hemorrhoids can prolapse and cause discomfort and/or bleeding.  The next time hemorrhoids are bothering her patient will contact our office and I will be happy to examine her and see if we can be of assistance.  In the meantime advised patient to keep her stools soft which they currently are      HPI:     Patient is a 67 year old female known to Dr. Silverio Decamp for screening colonoscopy in June of this year.  She was admitted to the hospital early November with sepsis/SIRS possibly from me upper respiratory infection.  At that time she also had acute on chronic renal failure and was complaining of dysphagia as well.  I saw her in consult for evaluation of the dysphasia.  She was noted to have ulcers in her mouth and on lips. She underwent inpatient  EGD by Dr. Ardis Hughs  with findings of several clean-based ulcers found throughout the esophagus, largest linear ulcer was about 4 cm long.  Several biopsies were taken but did not show a cause for the ulcers.  We were highly suspicious of a viral etiology.  Patient had already been on a Acyclovir which we advised her to continue and in fact increased the dose from 400 mg daily to 1 g daily.  She is on Valtrex now,  apparently we changed her to that at some point because the acyclovir was difficult for her to swallow . Patient was advised to follow-up with Dr. Silverio Decamp in 4 to 5 weeks, she is here to see me instead for follow-up  Patient's dysphagia and odynophagia have completely resolved.  She is still taking the Valtrex 1000 mg twice daily.   Patient was having problems with hemorrhoids during her hospitalization in November.  She was not having any bleeding but rather discomfort from the hemorrhoids.  She inquires about hemorrhoidal banding.   Review of systems:     No chest pain, no SOB, no fevers, no urinary sx   Past Medical History:  Diagnosis Date  . Allergic rhinitis   . Allergy   . Anemia    b12 def.  . Asthma    controlled w/ singulair  . Herpes simplex without mention of complication    controlled w/ meds (no current fever blisters)  . History of kidney stones   . HLD (  hyperlipidemia)    borderline-  no meds  . HTN (hypertension)    echo- 2006  . Hyperthyroidism hx ptu 2006   takes synthroid; had Graves disease, was treated for that- now thyroid doesn't work  . Leukocytosis, unspecified hx of 10 yrs ago   no problem since  . Migraine   . Mixed incontinence    urge and stress incontinence  . Neuromuscular disorder ([place])    hiatal hernia  . Renal cyst right    pt is unsure of this, has had kidney stones  . Ureteral calculi right    s/p laser  litho w/ stone extraction 01-28-11    Patient's surgical history, family medical history, social history, medications and allergies were all reviewed  in Epic   Creatinine clearance cannot be calculated (Patient's most recent lab result is older than the maximum 21 days allowed.)  Current Outpatient Medications  Medication Sig Dispense Refill  . calcium-vitamin D (OSCAL WITH D) 500-200 MG-UNIT per tablet Take 2 tablets by mouth daily.     . fenofibrate 160 MG tablet TAKE 1 TABLET BY MOUTH  DAILY (Patient taking differently: Take 160 mg by mouth daily. ) 90 tablet 3  . liothyronine (CYTOMEL) 25 MCG tablet Take 0.5 tablets (12.5 mcg total) by mouth every evening. Needs generic (Patient taking differently: Take 12.5 mcg by mouth daily. Needs generic) 90 tablet 3  . mirabegron ER (MYRBETRIQ) 50 MG TB24 tablet Take 1 tablet (50 mg total) by mouth daily. 90 tablet 3  . montelukast (SINGULAIR) 10 MG tablet Take 1 tablet (10 mg total) by mouth daily. 90 tablet 3  . Multiple Vitamin (MULTIVITAMIN) tablet Take 1 tablet by mouth daily.     . propranolol ER (INDERAL LA) 120 MG 24 hr capsule Take 1 capsule (120 mg total) by mouth daily. 90 capsule 3  . SUMAtriptan (IMITREX) 100 MG tablet TAKE 1 TABLET FOR HEADACHE MAY REPEAT ONCE IN 2 HOURS IF NEEDED. MAXIMUM OF 2    TABLETS IN ONE DAY. (Patient taking differently: Take 100 mg by mouth as needed for migraine or headache. TAKE 100 mg TABLET FOR HEADACHE MAY REPEAT ONCE IN 2 HOURS IF NEEDED. MAXIMUM OF 200 mg    TABLETS IN ONE DAY.) 27 tablet 3  . SYNTHROID 125 MCG tablet Take 1 tablet (125 mcg total) by mouth every morning. 90 tablet 3  . valACYclovir (VALTREX) 1000 MG tablet Take 1 tablet (1,000 mg total) by mouth 2 (two) times daily. 180 tablet 1  . vitamin B-12 (CYANOCOBALAMIN) 500 MCG tablet Take 500 mcg by mouth daily.       No current facility-administered medications for this visit.     Physical Exam:     BP 120/72   Pulse 61   Ht 5\' 6"  (1.676 m)   Wt 164 lb (74.4 kg)   BMI 26.47 kg/m   GENERAL:  Pleasant female in NAD PSYCH: : Cooperative, normal affect EENT:  conjunctiva pink, mucous  membranes moist, neck supple without masses CARDIAC:  RRR,  no peripheral edema PULM: Normal respiratory effort, lungs CTA bilaterally, no wheezing ABDOMEN:  Nondistended, soft, nontender. No obvious masses, no hepatomegaly,  normal bowel sounds SKIN:  turgor, no lesions seen Musculoskeletal:  Normal muscle tone, normal strength NEURO: Alert and oriented x 3, no focal neurologic deficits   Tye Savoy , NP 11/24/2018, 9:47 AM

## 2018-11-26 ENCOUNTER — Other Ambulatory Visit: Payer: Self-pay | Admitting: *Deleted

## 2018-11-26 LAB — URINE CULTURE
MICRO NUMBER: 91508724
SPECIMEN QUALITY: ADEQUATE

## 2018-11-26 MED ORDER — CEPHALEXIN 500 MG PO CAPS: 500.0000 mg | ORAL_CAPSULE | Freq: Two times a day (BID) | ORAL | 0 refills | Status: DC

## 2018-11-27 NOTE — Discharge Summary (Signed)
Physician Discharge Summary  [name] ERD:[id number] DOB: [birth year] DOA: 10/14/2018  PCP: Abner Greenspan, MD  Admit date: 10/14/2018 Discharge date: 11/27/2018  Time spent: 31 minutes  Recommendations for Outpatient Follow-up:  1. Follow-up with the primary care provider and GI team will be 1 to 2 weeks of discharge.   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  Filed Weights   10/14/18 2000 10/15/18 1955  Weight: 80.6 kg 77.3 kg   Hospital Course:  [name] a 67 year old female, with past medical history significant for chronic UTIs, bladder abnormalities, ureteral calculi status post cystoscopy and stone extraction with basket in 2012, colonic polyp, hypertension, mixed incontinence, migraine, renal cyst, hyperthyroidism (Grave's disease)/hypothyroidism (on synthroid), asthma, hyperlipidemia, anemia, CKD stage III and hiatal hernia.  According to the patient, her problems started about 3 weeks prior to presentation with what appeared to be URI/viral syndrome.  2 weeks prior to presentation, patient developed significant poor appetite, 15 pound weight loss, dysphagia for solids, generalized weakness and fatigue and urinary frequency.  Specifically, patient denied fever, chills, nausea or vomiting, abdominal pain, dysuria.  Patient has never had an EGD.  Patient had colonoscopy 3 to 4 months ago prior to presentation by [facility], and polyps were found.  On presentation to the hospital, urinalysis done was suggestive of possible UTI.  Urine culture grew pan sensitive E. Coli.  There was documentation of UTI/sepsis on presentation.  Patient was treated with IV Rocephin.  GI was consulted for dysphagia, odynophagia and weight loss, and EGD was performed.  EGD revealed esophageal ulcers thought to be viral in origin.  Low potassium (3.1), worsening renal function, with serum creatinine of 1.88  were noted.  Low potassium was repleted.  HCTZ and irbesartan were held due to worsening renal function and hypokalemia.  Blood pressure is on the low normal range, will continue to monitor off of anti-hypertensive medications.  Psychomotor retardation, depressed facie with no reactivity were noted, but patient denied being depressed.  Patient's husband reported prior history of depression when patient had active thyroid problems.  Further assessment and management of possible depressive illness will be deferred to the patient's PCP.   Post viral syndrome: Continued supportive care.  Dysphagia for solids: Consulted GI. See EGD findings. Patient will be discharged on antiviral therapy.  UTI (with documented sepsis): Urine culture grew E. Coli, pan sensitive.    Anorexia/weight loss: In the setting of odynophagia and dysphagia. Consulted GI for EGD. Odynophagia is suspected to be of viral etiology. Complete course of anti viral therapy Follow Pathology result Follow up with PCP and GI within 1-2 weeks of discharge.  Hypokalemia: Discontinue HCTZ. Potassium is repleted  Acute kidney injury on chronic kidney disease: Continues to improve Continue to monitor  Thyroid disease: Continue to monitor TSH and free T4  Depressed affect: PCP to continue further assessment and management of possible underlying depressive illness.  Thrombocytosis: Continue to monitor. Platelet count is slowly improving.   Please continue to trend on discharge.  Hyponatremia: Resolved.     Possibly pre renal. Patient was also on HCTZ   Procedures: Patient underwent an EGD.  EGD revealed several clean based ulcers throughout her esophagus. Some were round, some were linear.  The largest linear ulcer was about 4cm long. They are not typical appearing for acid related damage.  Several biopsies taken, sent for histology and viral culture.  Consultations:  Gastroenterology team.  Discharge  Exam: Vitals:   10/17/18 0451 10/17/18 0804  BP: 129/74 129/80  Pulse: 100 95  Resp: 16 18  Temp: 98 F (36.7 C) 98.7 F (37.1 C)  SpO2: 99% 99%    General: Not in any obvious distress.  Awake and alert. Cardiovascular: S1-S2. Respiratory: Clear to auscultation.  Discharge Instructions   Discharge Instructions    Diet - low sodium heart healthy   Complete by:  As directed    Increase activity slowly   Complete by:  As directed      Allergies as of 10/17/2018      Reactions   Contrast Media [iodinated Diagnostic Agents] Anaphylaxis   Shellfish-derived Products Anaphylaxis   Septra [sulfamethoxazole-trimethoprim]    Nausea       Medication List    STOP taking these medications   acyclovir 200 MG capsule Commonly known as:  ZOVIRAX   irbesartan-hydrochlorothiazide 150-12.5 MG tablet Commonly known as:  AVALIDE     TAKE these medications   calcium-vitamin D 500-200 MG-UNIT tablet Commonly known as:  OSCAL WITH D Take 2 tablets by mouth daily.   fenofibrate 160 MG tablet TAKE 1 TABLET BY MOUTH  DAILY   liothyronine 25 MCG tablet Commonly known as:  CYTOMEL Take 0.5 tablets (12.5 mcg total) by mouth every evening. Needs generic What changed:  when to take this   mirabegron ER 50 MG Tb24 tablet Commonly known as:  MYRBETRIQ Take 1 tablet (50 mg total) by mouth daily.   montelukast 10 MG tablet Commonly known as:  SINGULAIR Take 1 tablet (10 mg total) by mouth daily.   multivitamin tablet Take 1 tablet by mouth daily.   propranolol ER 120 MG 24 hr capsule Commonly known as:  INDERAL LA Take 1 capsule (120 mg total) by mouth daily.   SUMAtriptan 100 MG tablet Commonly known as:  IMITREX TAKE 1 TABLET FOR HEADACHE MAY REPEAT ONCE IN 2 HOURS IF NEEDED. MAXIMUM OF 2    TABLETS IN ONE DAY. What changed:    how much to take  how to take this  when to take this  reasons to take this  additional instructions   SYNTHROID 125 MCG tablet Generic  drug:  levothyroxine Take 1 tablet (125 mcg total) by mouth every morning.   vitamin B-12 500 MCG tablet Commonly known as:  CYANOCOBALAMIN Take 500 mcg by mouth daily.      Allergies  Allergen Reactions  . Contrast Media [Iodinated Diagnostic Agents] Anaphylaxis  . Shellfish-Derived Products Anaphylaxis  . Septra [Sulfamethoxazole-Trimethoprim]     Nausea       The results of significant diagnostics from this hospitalization (including imaging, microbiology, ancillary and laboratory) are listed below for reference.    Significant Diagnostic Studies: No results found.  Microbiology: Recent Results (from the past 240 hour(s))  Urine Culture     Status: Abnormal   Collection Time: 11/24/18  1:14 PM  Result Value Ref Range Status   MICRO NUMBER: [id number]  Final   SPECIMEN QUALITY: Adequate  Final   Sample Source URINE  Final   STATUS: FINAL  Final   ISOLATE 1: Escherichia coli (A)  Final    Comment: Greater than 100,000 CFU/mL of Escherichia coli      Susceptibility   Escherichia coli - URINE CULTURE, REFLEX    AMOX/CLAVULANIC 4 Sensitive     AMPICILLIN 8 Sensitive     AMPICILLIN/SULBACTAM 4 Sensitive     CEFAZOLIN* <=4 Not Reportable      *  For infections other than uncomplicated UTIcaused by E. coli, K. pneumoniae or P. mirabilis:Cefazolin is resistant if MIC > or = 8 mcg/mL.(Distinguishing susceptible versus intermediatefor isolates with MIC < or = 4 mcg/mL requiresadditional testing.)For uncomplicated UTI caused by E. coli,K. pneumoniae or P. mirabilis: Cefazolin issusceptible if MIC <32 mcg/mL and predictssusceptible to the oral agents cefaclor, cefdinir,cefpodoxime, cefprozil, cefuroxime, cephalexinand loracarbef.    CEFEPIME <=1 Sensitive     CEFTRIAXONE <=1 Sensitive     CIPROFLOXACIN <=0.25 Sensitive     LEVOFLOXACIN <=0.12 Sensitive     ERTAPENEM <=0.5 Sensitive     GENTAMICIN <=1 Sensitive     IMIPENEM <=0.25 Sensitive     NITROFURANTOIN <=16 Sensitive      PIP/TAZO <=4 Sensitive     TOBRAMYCIN <=1 Sensitive     TRIMETH/SULFA* <=20 Sensitive      * For infections other than uncomplicated UTIcaused by E. coli, K. pneumoniae or P. mirabilis:Cefazolin is resistant if MIC > or = 8 mcg/mL.(Distinguishing susceptible versus intermediatefor isolates with MIC < or = 4 mcg/mL requiresadditional testing.)For uncomplicated UTI caused by E. coli,K. pneumoniae or P. mirabilis: Cefazolin issusceptible if MIC <32 mcg/mL and predictssusceptible to the oral agents cefaclor, cefdinir,cefpodoxime, cefprozil, cefuroxime, cephalexinand loracarbef.Legend:S = Susceptible  I = IntermediateR = Resistant  NS = Not susceptible* = Not tested  NR = Not reported**NN = See antimicrobic comments     Labs: Basic Metabolic Panel: No results for input(s): NA, K, CL, CO2, GLUCOSE, BUN, CREATININE, CALCIUM, MG, PHOS in the last 168 hours. Liver Function Tests: No results for input(s): AST, ALT, ALKPHOS, BILITOT, PROT, ALBUMIN in the last 168 hours. No results for input(s): LIPASE, AMYLASE in the last 168 hours. No results for input(s): AMMONIA in the last 168 hours. CBC: No results for input(s): WBC, NEUTROABS, HGB, HCT, MCV, PLT in the last 168 hours. Cardiac Enzymes: No results for input(s): CKTOTAL, CKMB, CKMBINDEX, TROPONINI in the last 168 hours. BNP: BNP (last 3 results) No results for input(s): BNP in the last 8760 hours.  ProBNP (last 3 results) No results for input(s): PROBNP in the last 8760 hours.  CBG: No results for input(s): GLUCAP in the last 168 hours.     Signed:  Dana Allan, MD  [facility] Pager #: [phone] 7PM-7AM contact night coverage as above

## 2018-12-17 NOTE — Progress Notes (Signed)
Reviewed and agree with documentation and assessment and plan. K. Veena Nandigam , MD   

## 2019-02-11 ENCOUNTER — Other Ambulatory Visit: Payer: Self-pay | Admitting: Family Medicine

## 2019-04-05 ENCOUNTER — Other Ambulatory Visit: Payer: Self-pay | Admitting: Family Medicine

## 2019-04-12 ENCOUNTER — Telehealth: Payer: Self-pay | Admitting: Family Medicine

## 2019-04-12 DIAGNOSIS — E781 Pure hyperglyceridemia: Secondary | ICD-10-CM

## 2019-04-12 DIAGNOSIS — R7303 Prediabetes: Secondary | ICD-10-CM

## 2019-04-12 DIAGNOSIS — E039 Hypothyroidism, unspecified: Secondary | ICD-10-CM

## 2019-04-12 DIAGNOSIS — I10 Essential (primary) hypertension: Secondary | ICD-10-CM

## 2019-04-12 DIAGNOSIS — D75839 Thrombocytosis, unspecified: Secondary | ICD-10-CM

## 2019-04-12 DIAGNOSIS — D473 Essential (hemorrhagic) thrombocythemia: Secondary | ICD-10-CM

## 2019-04-12 DIAGNOSIS — D649 Anemia, unspecified: Secondary | ICD-10-CM

## 2019-04-12 DIAGNOSIS — N289 Disorder of kidney and ureter, unspecified: Secondary | ICD-10-CM

## 2019-04-12 NOTE — Telephone Encounter (Signed)
-----   Message from Cloyd Stagers, RT sent at 04/07/2019  1:04 PM EDT ----- Regarding: Lab Orders for Thursday 5.7.2020 Please place lab orders for Thursday 5.7.2020 , Doxy[facility]me visit on Thursday 5.14.2020 Thank you, Dyke Maes RT(R)

## 2019-04-14 ENCOUNTER — Other Ambulatory Visit: Payer: Self-pay

## 2019-04-14 ENCOUNTER — Ambulatory Visit (INDEPENDENT_AMBULATORY_CARE_PROVIDER_SITE_OTHER): Payer: Medicare Other

## 2019-04-14 DIAGNOSIS — Z0000 Encounter for general adult medical examination without abnormal findings: Secondary | ICD-10-CM | POA: Diagnosis not present

## 2019-04-14 NOTE — Progress Notes (Signed)
PCP notes:   Health maintenance:  No gaps identified  Abnormal screenings:   None  Patient concerns:   None  Nurse concerns:  None  Next PCP appt:   04/22/19 @ 1215  I reviewed health advisor's note, was available for consultation, and agree with documentation and plan. Loura Pardon MD

## 2019-04-14 NOTE — Progress Notes (Signed)
Subjective:   [name] is a 68 y.o. female who presents for Medicare Annual (Subsequent) preventive examination.  Review of Systems:  N/A Cardiac Risk Factors include: advanced age (>26men, >71 women);dyslipidemia;hypertension     Objective:     Vitals: There were no vitals taken for this visit.  There is no height or weight on file to calculate BMI.  Advanced Directives 04/14/2019 10/14/2018 05/19/2018 04/08/2018 04/04/2017 10/18/2011  Does Patient Have a Medical Advance Directive? Yes Yes Yes Yes Yes Patient has advance directive, copy not in chart  Type of Advance Directive [place];Living will [place];Living will Healthcare Power of Power;Living will [place];Living will -  Does patient want to make changes to medical advance directive? - No - Patient declined - - - -  Copy of [place] in Chart? Yes - validated most recent copy scanned in chart (See row information) Yes - validated most recent copy scanned in chart (See row information) - Yes Yes -    Tobacco Social History   Tobacco Use  Smoking Status Never Smoker  Smokeless Tobacco Never Used     Counseling given: No   Clinical Intake:  Pre-visit preparation completed: Yes  Pain : No/denies pain Pain Score: 0-No pain     Nutritional Status: BMI 25 -29 Overweight Nutritional Risks: None Diabetes: No  How often do you need to have someone help you when you read instructions, pamphlets, or other written materials from your doctor or pharmacy?: 1 - Never  Interpreter Needed?: No  Comments: pt lives with spouse Information entered by :: LPinson, LPN  Past Medical History:  Diagnosis Date  . Allergic rhinitis   . Allergy   . Anemia    b12 def.  . Asthma    controlled w/ singulair  . Herpes simplex without mention of complication    controlled w/ meds (no current fever blisters)  . History of  kidney stones   . HLD (hyperlipidemia)    borderline-  no meds  . HTN (hypertension)    echo- 2006  . Hyperthyroidism hx ptu 2006   takes synthroid; had Graves disease, was treated for that- now thyroid doesn't work  . Leukocytosis, unspecified hx of 10 yrs ago   no problem since  . Migraine   . Mixed incontinence    urge and stress incontinence  . Neuromuscular disorder ([place])    hiatal hernia  . Renal cyst right    pt is unsure of this, has had kidney stones  . Ureteral calculi right    s/p laser  litho w/ stone extraction 01-28-11   Past Surgical History:  Procedure Laterality Date  . BIOPSY  10/16/2018   Procedure: BIOPSY;  Surgeon: Milus Banister, MD;  Location[facility] ENDOSCOPY;  Service: Endoscopy;;  . [place]  . CERVICAL FUSION  2000   fusion c4-6  . CERVICAL FUSION  2005   fusion c6-7 and plate removal T9-0  . COLONOSCOPY    . CYSTOSCOPY W/ RETROGRADES  10/21/2011   Procedure: CYSTOSCOPY WITH RETROGRADE PYELOGRAM;  Surgeon: Ailene Rud, MD;  Location: [facility];  Service: Urology;  Laterality: Right;  CYSTOSCOPY RIGHT RETROGRADE, PYELOGRAM  URETEROSCOPY WITH HOLMIUM LASER AND STONE EXTRACTION  . CYSTOSCOPY/RETROGRADE/URETEROSCOPY/STONE EXTRACTION WITH BASKET  01-28-11   right   . ESOPHAGOGASTRODUODENOSCOPY (EGD) WITH PROPOFOL N/A 10/16/2018   Procedure: ESOPHAGOGASTRODUODENOSCOPY (EGD) WITH PROPOFOL;  Surgeon: Ardis Hughs,  Melene Plan, MD;  Location: [facility] ENDOSCOPY;  Service: Endoscopy;  Laterality: N/A;  . REDUCTION MAMMAPLASTY Bilateral 1995  . TONSILLECTOMY  1960  . VAGINAL HYSTERECTOMY  1987   fibroids/anemia   Family History  Problem Relation Age of Onset  . Heart failure Father   . Osteoporosis Mother   . Diabetes Mother   . Diabetes Other        Grandfather  . Coronary artery disease Other        Grandmother  . Breast cancer Neg Hx   . Colon cancer Neg Hx   . Esophageal cancer Neg Hx   . Stomach cancer Neg Hx   .  Rectal cancer Neg Hx    Social History   Socioeconomic History  . Marital status: Married    Spouse name: Not on file  . Number of children: 1  . Years of education: Not on file  . Highest education level: Not on file  Occupational History  . Occupation: Publishing rights manager: [employer]  . Financial resource strain: Not on file  . Food insecurity:    Worry: Not on file    Inability: Not on file  . Transportation needs:    Medical: Not on file    Non-medical: Not on file  Tobacco Use  . Smoking status: Never Smoker  . Smokeless tobacco: Never Used  Substance and Sexual Activity  . Alcohol use: No    Alcohol/week: 0.0 standard drinks  . Drug use: No  . Sexual activity: Not on file  Lifestyle  . Physical activity:    Days per week: Not on file    Minutes per session: Not on file  . Stress: Not on file  Relationships  . Social connections:    Talks on phone: Not on file    Gets together: Not on file    Attends religious service: Not on file    Active member of club or organization: Not on file    Attends meetings of clubs or organizations: Not on file    Relationship status: Not on file  Other Topics Concern  . 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 calcium-vitamin D (OSCAL WITH D) 500-200 MG-UNIT per tablet Take 2 tablets by mouth daily.   . fenofibrate 160 MG tablet TAKE 1 TABLET BY MOUTH  DAILY (Patient taking differently: Take 160 mg by mouth daily. )  . liothyronine (CYTOMEL) 25 MCG tablet Take 0.5 tablets (12.5 mcg total) by mouth every evening. Needs generic (Patient taking differently: Take 12.5 mcg by mouth daily. Needs generic)  . montelukast (SINGULAIR) 10 MG tablet TAKE 1 TABLET BY MOUTH  DAILY  . Multiple Vitamin (MULTIVITAMIN) tablet Take 1 tablet by mouth daily.   Marland Kitchen MYRBETRIQ 50 MG  TB24 tablet TAKE 1 TABLET BY MOUTH  DAILY  . propranolol ER (INDERAL LA) 120 MG 24 hr capsule TAKE 1 CAPSULE BY MOUTH  DAILY  . SUMAtriptan (IMITREX) 100 MG tablet TAKE 1 TABLET FOR HEADACHE MAY REPEAT ONCE IN 2 HOURS IF NEEDED. MAXIMUM OF 2    TABLETS IN ONE DAY. (Patient taking differently: Take 100 mg by mouth as needed for migraine or headache. TAKE 100 mg TABLET FOR HEADACHE MAY REPEAT ONCE IN 2 HOURS  IF NEEDED. MAXIMUM OF 200 mg    TABLETS IN ONE DAY.)  . SYNTHROID 125 MCG tablet Take 1 tablet (125 mcg total) by mouth every morning.  . valACYclovir (VALTREX) 1000 MG tablet TAKE 1 TABLET BY MOUTH TWO  TIMES DAILY  . vitamin B-12 (CYANOCOBALAMIN) 500 MCG tablet Take 500 mcg by mouth daily.    . [DISCONTINUED] cephALEXin (KEFLEX) 500 MG capsule Take 1 capsule (500 mg total) by mouth 2 (two) times daily.   No facility-administered encounter medications on file as of 04/14/2019.     Activities of Daily Living In your present state of health, do you have any difficulty performing the following activities: 04/14/2019 10/14/2018  Hearing? N -  Vision? N -  Difficulty concentrating or making decisions? N -  Walking or climbing stairs? N -  Dressing or bathing? N -  Doing errands, shopping? N N  Preparing Food and eating ? N -  Using the Toilet? N -  In the past six months, have you accidently leaked urine? N -  Do you have problems with loss of bowel control? N -  Managing your Medications? N -  Managing your Finances? N -  Housekeeping or managing your Housekeeping? N -  Some recent data might be hidden    Patient Care Team: Tower, Wynelle Fanny, MD as PCP - General    Assessment:   This is a routine wellness examination for Ronald.  Vision Screening Comments: Vision exam in 2019    Exercise Activities and Dietary recommendations Current Exercise Habits: The patient does not participate in regular exercise at present, Exercise limited by: None identified  Goals    . Patient Stated      Starting 04/14/2019, I will continue to take medications as prescribed.        Fall Risk Fall Risk  04/14/2019 04/08/2018 04/04/2017 04/03/2016  Falls in the past year? 0 No No No   Depression Screen PHQ 2/9 Scores 04/14/2019 04/08/2018 04/04/2017 04/03/2016  PHQ - 2 Score 0 0 0 0  PHQ- 9 Score 0 0 - -     Cognitive Function MMSE - Mini Mental State Exam 04/14/2019 04/08/2018 04/04/2017  Orientation to time 5 5 5   Orientation to Place 5 5 5   Registration 3 3 3   Attention/ Calculation 0 0 0  Recall 3 3 3   Language- name 2 objects 0 0 0  Language- repeat 1 1 1   Language- follow 3 step command 0 3 3  Language- read & follow direction 0 0 0  Write a sentence 0 0 0  Copy design 0 0 0  Total score 17 20 20      PLEASE NOTE: A Mini-Cog screen was completed. Maximum score is 17. A value of 0 denotes this part of [place] MMSE was not completed or the patient failed this part of the Mini-Cog screening.   Mini-Cog Screening Orientation to Time - Max 5 pts Orientation to Place - Max 5 pts Registration - Max 3 pts Recall - Max 3 pts Language Repeat - Max 1 pts      Immunization History  Administered Date(s) Administered  . Influenza Split 08/10/2011  . Influenza Whole 10/10/2007, 08/17/2008, 08/31/2009, 08/09/2010  . Influenza, High Dose Seasonal PF 07/18/2017, 07/09/2018  . Influenza,inj,Quad PF,6+ Mos 08/04/2015, 09/02/2016  . Influenza-Unspecified 08/09/2014  . Pneumococcal Conjugate-13 11/28/2015  . Pneumococcal Polysaccharide-23 08/25/2003, 10/10/2007  . Pneumococcal-Unspecified 11/27/2016  . Td 10/21/2006  . Tdap 03/20/2015  . Zoster 03/25/2012  . Zoster Recombinat (  Shingrix) 09/04/2018, 12/09/2018    Screening Tests Health Maintenance  Topic Date Due  . MAMMOGRAM  05/26/2019  . INFLUENZA VACCINE  07/10/2019  . TETANUS/TDAP  03/19/2025  . COLONOSCOPY  05/19/2028  . DEXA SCAN  Completed  . Hepatitis C Screening  Completed  . PNA vac Low Risk Adult  Completed       Plan:      I have personally reviewed, addressed, and noted the following in the patient's chart:  A. Medical and social history B. Use of alcohol, tobacco or illicit drugs  C. Current medications and supplements D. Functional ability and status E.  Nutritional status F.  Physical activity G. Advance directives H. List of other physicians I.  Hospitalizations, surgeries, and ER visits in previous 12 months J.  Vitals (unless it is a telemedicine encounter) K. Screenings to include cognitive, depression, hearing, vision (NOTE: hearing and vision screenings not completed in telemedicine encounter) L. Referrals and appointments   In addition, I have reviewed and discussed with patient certain preventive protocols, quality metrics, and best practice recommendations. A written personalized care plan for preventive services and recommendations were provided to patient.  With patient's permission, we connected on 04/14/19 at 10:30 AM EDT by a video enabled telemedicine application. Two patient identifiers were used to ensure the encounter occurred with the correct person.    Patient was in home and writer was in office.   Signed,   Lindell Noe, MHA, BS, LPN Health Coach

## 2019-04-14 NOTE — Patient Instructions (Signed)
[name] , Thank you for taking time to come for your Medicare Wellness Visit. I appreciate your ongoing commitment to your health goals. Please review the following plan we discussed and let me know if I can assist you in the future.   These are the goals we discussed: Goals    . Patient Stated     Starting 04/14/2019, I will continue to take medications as prescribed.        This is a list of the screening recommended for you and due dates:  Health Maintenance  Topic Date Due  . Mammogram  05/26/2019  . Flu Shot  07/10/2019  . Tetanus Vaccine  03/19/2025  . Colon Cancer Screening  05/19/2028  . DEXA scan (bone density measurement)  Completed  .  Hepatitis C: One time screening is recommended by Center for Disease Control  (CDC) for  adults born from [birth year] through 1966.   Completed  . Pneumonia vaccines  Completed   Preventive Care for Adults  A healthy lifestyle and preventive care can promote health and wellness. Preventive health guidelines for adults include the following key practices.  . A routine yearly physical is a good way to check with your health care provider about your health and preventive screening. It is a chance to share any concerns and updates on your health and to receive a thorough exam.  . Visit your dentist for a routine exam and preventive care every 6 months. Brush your teeth twice a day and floss once a day. Good oral hygiene prevents tooth decay and gum disease.  . The frequency of eye exams is based on your age, health, family medical history, use  of contact lenses, and other factors. Follow your health care provider's recommendations for frequency of eye exams.  . Eat a healthy diet. Foods like vegetables, fruits, whole grains, low-fat dairy products, and lean protein foods contain the nutrients you need without too many calories. Decrease your intake of foods high in solid fats, added sugars, and salt. Eat the right amount of calories for you. Get  information about a proper diet from your health care provider, if necessary.  . Regular physical exercise is one of the most important things you can do for your health. Most adults should get at least 150 minutes of moderate-intensity exercise (any activity that increases your heart rate and causes you to sweat) each week. In addition, most adults need muscle-strengthening exercises on 2 or more days a week.  Silver Sneakers may be a benefit available to you. To determine eligibility, you may visit the website: www.silversneakers.com or contact program at (567)551-3967 Mon-Fri between 8AM-8PM.   . Maintain a healthy weight. The body mass index (BMI) is a screening tool to identify possible weight problems. It provides an estimate of body fat based on height and weight. Your health care provider can find your BMI and can help you achieve or maintain a healthy weight.   For adults 20 years and older: ? A BMI below 18.5 is considered underweight. ? A BMI of 18.5 to 24.9 is normal. ? A BMI of 25 to 29.9 is considered overweight. ? A BMI of 30 and above is considered obese.   . Maintain normal blood lipids and cholesterol levels by exercising and minimizing your intake of saturated fat. Eat a balanced diet with plenty of fruit and vegetables. Blood tests for lipids and cholesterol should begin at age 19 and be repeated every 5 years. If your lipid or cholesterol  levels are high, you are over 50, or you are at high risk for heart disease, you may need your cholesterol levels checked more frequently. Ongoing high lipid and cholesterol levels should be treated with medicines if diet and exercise are not working.  . If you smoke, find out from your health care provider how to quit. If you do not use tobacco, please do not start.  . If you choose to drink alcohol, please do not consume more than 2 drinks per day. One drink is considered to be 12 ounces (355 mL) of beer, 5 ounces (148 mL) of wine, or 1.5  ounces (44 mL) of liquor.  . If you are 62-50 years old, ask your health care provider if you should take aspirin to prevent strokes.  . Use sunscreen. Apply sunscreen liberally and repeatedly throughout the day. You should seek shade when your shadow is shorter than you. Protect yourself by wearing long sleeves, pants, a wide-brimmed hat, and sunglasses year round, whenever you are outdoors.  . Once a month, do a whole body skin exam, using a mirror to look at the skin on your back. Tell your health care provider of new moles, moles that have irregular borders, moles that are larger than a pencil eraser, or moles that have changed in shape or color.

## 2019-04-15 ENCOUNTER — Other Ambulatory Visit (INDEPENDENT_AMBULATORY_CARE_PROVIDER_SITE_OTHER): Payer: Medicare Other

## 2019-04-15 DIAGNOSIS — N289 Disorder of kidney and ureter, unspecified: Secondary | ICD-10-CM

## 2019-04-15 DIAGNOSIS — E039 Hypothyroidism, unspecified: Secondary | ICD-10-CM

## 2019-04-15 DIAGNOSIS — E781 Pure hyperglyceridemia: Secondary | ICD-10-CM | POA: Diagnosis not present

## 2019-04-15 DIAGNOSIS — I10 Essential (primary) hypertension: Secondary | ICD-10-CM | POA: Diagnosis not present

## 2019-04-15 DIAGNOSIS — D473 Essential (hemorrhagic) thrombocythemia: Secondary | ICD-10-CM | POA: Diagnosis not present

## 2019-04-15 DIAGNOSIS — D649 Anemia, unspecified: Secondary | ICD-10-CM

## 2019-04-15 DIAGNOSIS — R7303 Prediabetes: Secondary | ICD-10-CM

## 2019-04-15 DIAGNOSIS — D75839 Thrombocytosis, unspecified: Secondary | ICD-10-CM

## 2019-04-15 LAB — CBC WITH DIFFERENTIAL/PLATELET
Basophils Absolute: 0.1 10*3/uL (ref 0.0–0.1)
Basophils Relative: 0.9 % (ref 0.0–3.0)
Eosinophils Absolute: 0.4 10*3/uL (ref 0.0–0.7)
Eosinophils Relative: 4.8 % (ref 0.0–5.0)
HCT: 35.4 % — ABNORMAL LOW (ref 36.0–46.0)
Hemoglobin: 11.9 g/dL — ABNORMAL LOW (ref 12.0–15.0)
Lymphocytes Relative: 25.5 % (ref 12.0–46.0)
Lymphs Abs: 2 10*3/uL (ref 0.7–4.0)
MCHC: 33.5 g/dL (ref 30.0–36.0)
MCV: 90.6 fl (ref 78.0–100.0)
Monocytes Absolute: 0.4 10*3/uL (ref 0.1–1.0)
Monocytes Relative: 5.2 % (ref 3.0–12.0)
Neutro Abs: 5 10*3/uL (ref 1.4–7.7)
Neutrophils Relative %: 63.6 % (ref 43.0–77.0)
Platelets: 474 10*3/uL — ABNORMAL HIGH (ref 150.0–400.0)
RBC: 3.9 Mil/uL (ref 3.87–5.11)
RDW: 15.7 % — ABNORMAL HIGH (ref 11.5–15.5)
WBC: 7.9 10*3/uL (ref 4.0–10.5)

## 2019-04-15 LAB — COMPREHENSIVE METABOLIC PANEL
ALT: 22 U/L (ref 0–35)
AST: 25 U/L (ref 0–37)
Albumin: 4.1 g/dL (ref 3.5–5.2)
Alkaline Phosphatase: 68 U/L (ref 39–117)
BUN: 36 mg/dL — ABNORMAL HIGH (ref 6–23)
CO2: 24 mEq/L (ref 19–32)
Calcium: 9.2 mg/dL (ref 8.4–10.5)
Chloride: 105 mEq/L (ref 96–112)
Creatinine, Ser: 1.49 mg/dL — ABNORMAL HIGH (ref 0.40–1.20)
GFR: 34.76 mL/min — ABNORMAL LOW (ref >60.00)
Glucose, Bld: 90 mg/dL (ref 70–99)
Potassium: 4.2 mEq/L (ref 3.5–5.1)
Sodium: 139 mEq/L (ref 135–145)
Total Bilirubin: 0.6 mg/dL (ref 0.2–1.2)
Total Protein: 7.3 g/dL (ref 6.0–8.3)

## 2019-04-15 LAB — LIPID PANEL
Cholesterol: 158 mg/dL (ref 0–200)
HDL: 73.5 mg/dL (ref >39.00)
LDL Cholesterol: 56 mg/dL (ref 0–99)
NonHDL: 84.63
Total CHOL/HDL Ratio: 2
Triglycerides: 144 mg/dL (ref 0.0–149.0)
VLDL: 28.8 mg/dL (ref 0.0–40.0)

## 2019-04-15 LAB — HEMOGLOBIN A1C: Hgb A1c MFr Bld: 5.6 % (ref 4.6–6.5)

## 2019-04-15 LAB — TSH: TSH: 0.49 u[IU]/mL (ref 0.35–4.50)

## 2019-04-15 LAB — FERRITIN: Ferritin: 270.2 ng/mL (ref 10.0–291.0)

## 2019-04-16 ENCOUNTER — Encounter: Payer: Medicare Other | Admitting: Family Medicine

## 2019-04-22 ENCOUNTER — Encounter: Payer: Self-pay | Admitting: Family Medicine

## 2019-04-22 ENCOUNTER — Ambulatory Visit (INDEPENDENT_AMBULATORY_CARE_PROVIDER_SITE_OTHER): Payer: Medicare Other | Admitting: Family Medicine

## 2019-04-22 VITALS — BP 115/79 | Wt 160.0 lb

## 2019-04-22 DIAGNOSIS — N289 Disorder of kidney and ureter, unspecified: Secondary | ICD-10-CM | POA: Diagnosis not present

## 2019-04-22 DIAGNOSIS — D649 Anemia, unspecified: Secondary | ICD-10-CM | POA: Diagnosis not present

## 2019-04-22 DIAGNOSIS — D75839 Thrombocytosis, unspecified: Secondary | ICD-10-CM

## 2019-04-22 DIAGNOSIS — K221 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: Secondary | ICD-10-CM

## 2019-04-22 DIAGNOSIS — R7303 Prediabetes: Secondary | ICD-10-CM

## 2019-04-22 DIAGNOSIS — I10 Essential (primary) hypertension: Secondary | ICD-10-CM | POA: Diagnosis not present

## 2019-04-22 DIAGNOSIS — E039 Hypothyroidism, unspecified: Secondary | ICD-10-CM | POA: Diagnosis not present

## 2019-04-22 DIAGNOSIS — E781 Pure hyperglyceridemia: Secondary | ICD-10-CM

## 2019-04-22 DIAGNOSIS — D473 Essential (hemorrhagic) thrombocythemia: Secondary | ICD-10-CM

## 2019-04-22 MED ORDER — ACYCLOVIR 400 MG PO TABS: 400.0000 mg | ORAL_TABLET | Freq: Every day | ORAL | 3 refills | Status: DC

## 2019-04-22 MED ORDER — MONTELUKAST SODIUM 10 MG PO TABS: 10.0000 mg | ORAL_TABLET | Freq: Every day | ORAL | 3 refills | Status: DC

## 2019-04-22 MED ORDER — LIOTHYRONINE SODIUM 25 MCG PO TABS: 12.5000 ug | ORAL_TABLET | Freq: Every evening | ORAL | 3 refills | Status: DC

## 2019-04-22 MED ORDER — MIRABEGRON ER 50 MG PO TB24: 50.0000 mg | ORAL_TABLET | Freq: Every day | ORAL | 3 refills | Status: DC

## 2019-04-22 MED ORDER — SYNTHROID 125 MCG PO TABS: 125.0000 ug | ORAL_TABLET | Freq: Every morning | ORAL | 3 refills | Status: DC

## 2019-04-22 MED ORDER — FENOFIBRATE 160 MG PO TABS: 160.0000 mg | ORAL_TABLET | Freq: Every day | ORAL | 3 refills | Status: DC

## 2019-04-22 MED ORDER — PROPRANOLOL HCL ER 120 MG PO CP24: 120.0000 mg | ORAL_CAPSULE | Freq: Every day | ORAL | 3 refills | Status: DC

## 2019-04-22 NOTE — Patient Instructions (Addendum)
Your mammogram is due in June - schedule it when you feel safe to go  Take tylenol instead of ibuprofen or aleve for pain-it is easier on the kidneys  Drink lots of water/stay hydrated Aim for 30 minutes of exercise daily - indoors or out  Wear a mask when going into pulblic places  I am changing valtrex back to acyclovir-let me know if you have any problems   Take care of yourself !

## 2019-04-22 NOTE — Progress Notes (Signed)
Virtual Visit via Video Note  I connected with [name] on 04/22/19 at 12:15 PM EDT by a video enabled telemedicine application and verified that I am speaking with the correct person using two identifiers.  Location: Patient: home Provider: office    I discussed the limitations of evaluation and management by telemedicine and the availability of in person appointments. The patient expressed understanding and agreed to proceed.  History of Present Illness: Pt is here for annual f/u of chronic medical problems   Had amw with Lesia No gaps in care or needs identified   Wt Readings from Last 3 Encounters:  11/24/18 164 lb (74.4 kg)  10/23/18 159 lb (72.1 kg)  10/15/18 170 lb 6.7 oz (77.3 kg)  160 lb   Mammogram 6/19 Self breast exam - no lumps or changes   Colonoscopy 6/19   dexa 6/19- BMD in the normal range No falls or fractures  Not exercising  Walks out in the yard   HTN No cp or palpitations or headaches or edema  No side effects to medicines  BP Readings from Last 3 Encounters:  11/24/18 120/72  10/23/18 118/78  10/17/18 129/80    This am was 115/79   Lab Results  Component Value Date   CREATININE 1.49 (H) 04/15/2019   BUN 36 (H) 04/15/2019   NA 139 04/15/2019   K 4.2 04/15/2019   CL 105 04/15/2019   CO2 24 04/15/2019  cr down from 1.65 Really working on fluid and water intake  occ advil   Lab Results  Component Value Date   ALT 22 04/15/2019   AST 25 04/15/2019   ALKPHOS 68 04/15/2019   BILITOT 0.6 04/15/2019     Hypothyroidism  Pt has no clinical changes No change in energy level/ hair or skin/ edema and no tremor Lab Results  Component Value Date   TSH 0.49 04/15/2019    Stable with current levo and cytomel    h/o anemia and thrombocytosis Lab Results  Component Value Date   WBC 7.9 04/15/2019   HGB 11.9 (L) 04/15/2019   HCT 35.4 (L) 04/15/2019   MCV 90.6 04/15/2019   PLT 474.0 (H) 04/15/2019   Improved from 9.9  Energy  level is good  Lab Results  Component Value Date   FERRITIN 270.2 04/15/2019   Hyperlipidemia Lab Results  Component Value Date   CHOL 158 04/15/2019   CHOL 179 04/08/2018   CHOL 198 04/04/2017   Lab Results  Component Value Date   HDL 73.50 04/15/2019   HDL 67.80 04/08/2018   HDL 79.80 04/04/2017   Lab Results  Component Value Date   LDLCALC 56 04/15/2019   LDLCALC 78 04/04/2017   LDLCALC 88 03/20/2015   Lab Results  Component Value Date   TRIG 144.0 04/15/2019   TRIG 237.0 (H) 04/08/2018   TRIG 200.0 (H) 04/04/2017   Lab Results  Component Value Date   CHOLHDL 2 04/15/2019   CHOLHDL 3 04/08/2018   CHOLHDL 2 04/04/2017   Lab Results  Component Value Date   LDLDIRECT 90.0 04/08/2018   LDLDIRECT 112.0 03/29/2016   LDLDIRECT 123.4 06/21/2013     Taking fenofibrate-no problems Diet is good   Prediabetes Lab Results  Component Value Date   HGBA1C 5.6 04/15/2019   Down from 5.9 Watching diet   Needs to change back to acyclovir from valcyclovir for cost/also does not need as much medication No return of HSV and esophageal ulcer-doing well   Review of  Systems  Constitutional: Negative for chills, fever and malaise/fatigue.  HENT: Negative for hearing loss and sore throat.   Eyes: Negative for blurred vision.  Respiratory: Negative for cough and shortness of breath.   Cardiovascular: Negative for chest pain and leg swelling.  Gastrointestinal: Negative for blood in stool, constipation, diarrhea and heartburn.  Skin: Negative for rash.  Neurological: Negative for dizziness and headaches.  Endo/Heme/Allergies: Negative for polydipsia. Does not bruise/bleed easily.  Psychiatric/Behavioral: Negative for depression and memory loss. The patient is not nervous/anxious.     Patient Active Problem List   Diagnosis Date Noted  . Hyperkalemia 10/29/2018  . Hemorrhoids 10/23/2018  . Renal insufficiency 10/23/2018  . 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 Other  dysphagia   . Elevated serum creatinine 04/19/2018  . Estrogen deficiency 04/15/2018  . Mild anemia 04/15/2018  . Bladder prolapse, female, 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  . Dyspepsia 04/14/2017  . Colon cancer screening 04/09/2017  . Prediabetes 03/30/2017  . Medicare annual wellness visit, initial 04/04/2016  . Other screening mammogram 12/31/2011  . Post-menopausal 12/31/2011  . Routine general medical examination at a health care facility 12/23/2011  . 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  . Hypothyroidism 11/08/2008  . HSV (herpes simplex virus) infection 09/23/2007  . Hypertriglyceridemia 09/23/2007  . Essential hypertension 09/23/2007  . ALLERGIC RHINITIS 09/23/2007  . ASTHMA 09/23/2007  . HYPOKALEMIA 04/16/2007  . Thrombocytosis ([place]) 04/09/2007   Past Medical History:  Diagnosis Date  . Allergic rhinitis   . Allergy   . Anemia    b12 def.  . Asthma    controlled w/ singulair  . Herpes simplex without mention of complication    controlled w/ meds (no current fever blisters)  . History of kidney stones   . HLD (hyperlipidemia)    borderline-  no meds  . HTN (hypertension)    echo- 2006  . Hyperthyroidism hx ptu 2006   takes synthroid; had Graves disease, was treated for that- now thyroid doesn't work  . Leukocytosis, unspecified hx of 10 yrs ago   no problem since  . Migraine   . Mixed incontinence    urge and stress incontinence  . Neuromuscular disorder ([place])    hiatal hernia  . Renal cyst right    pt is unsure of this, has had kidney stones  . Ureteral calculi right    s/p laser  litho w/ stone extraction 01-28-11   Past Surgical History:  Procedure Laterality Date  . BIOPSY  10/16/2018   Procedure: BIOPSY;  Surgeon: Milus Banister, MD;  Location: [facility] ENDOSCOPY;  Service: Endoscopy;;  . [place]  . CERVICAL FUSION  2000   fusion c4-6  . CERVICAL FUSION  2005   fusion c6-7 and plate removal O8-4  . COLONOSCOPY    . CYSTOSCOPY  W/ RETROGRADES  10/21/2011   Procedure: CYSTOSCOPY WITH RETROGRADE PYELOGRAM;  Surgeon: Ailene Rud, MD;  Location: [facility];  Service: Urology;  Laterality: Right;  CYSTOSCOPY RIGHT RETROGRADE, PYELOGRAM  URETEROSCOPY WITH HOLMIUM LASER AND STONE EXTRACTION  . CYSTOSCOPY/RETROGRADE/URETEROSCOPY/STONE EXTRACTION WITH BASKET  01-28-11   right   . ESOPHAGOGASTRODUODENOSCOPY (EGD) WITH PROPOFOL N/A 10/16/2018   Procedure: ESOPHAGOGASTRODUODENOSCOPY (EGD) WITH PROPOFOL;  Surgeon: Milus Banister, MD;  Location[facility] ENDOSCOPY;  Service: Endoscopy;  Laterality: N/A;  . REDUCTION MAMMAPLASTY Bilateral 1995  . TONSILLECTOMY  1960  . VAGINAL HYSTERECTOMY  1987   fibroids/anemia   Social History   Tobacco Use  .  Smoking status: Never Smoker  . Smokeless tobacco: Never Used  Substance Use Topics  . Alcohol use: No    Alcohol/week: 0.0 standard drinks  . Drug use: No   Family History  Problem Relation Age of Onset  . Heart failure Father   . Osteoporosis Mother   . Diabetes Mother   . Diabetes Other        Grandfather  . Coronary artery disease Other        Grandmother  . Breast cancer Neg Hx   . Colon cancer Neg Hx   . Esophageal cancer Neg Hx   . Stomach cancer Neg Hx   . Rectal cancer Neg Hx    Allergies  Allergen Reactions  . Contrast Media [Iodinated Diagnostic Agents] Anaphylaxis  . Shellfish-Derived Products Anaphylaxis  . Septra [Sulfamethoxazole-Trimethoprim]     Nausea    Current Outpatient Medications on File Prior to Visit  Medication Sig Dispense Refill  . calcium-vitamin D (OSCAL WITH D) 500-200 MG-UNIT per tablet Take 2 tablets by mouth daily.     . Multiple Vitamin (MULTIVITAMIN) tablet Take 1 tablet by mouth daily.     . SUMAtriptan (IMITREX) 100 MG tablet TAKE 1 TABLET FOR HEADACHE MAY REPEAT ONCE IN 2 HOURS IF NEEDED. MAXIMUM OF 2    TABLETS IN ONE DAY. (Patient taking differently: Take 100 mg by mouth as needed for migraine or headache.  TAKE 100 mg TABLET FOR HEADACHE MAY REPEAT ONCE IN 2 HOURS IF NEEDED. MAXIMUM OF 200 mg    TABLETS IN ONE DAY.) 27 tablet 3  . vitamin B-12 (CYANOCOBALAMIN) 500 MCG tablet Take 500 mcg by mouth daily.       No current facility-administered medications on file prior to visit.     Observations/Objective: Patient appears well, in no distress Weight is baseline  No facial swelling or asymmetry Normal voice-not hoarse and no slurred speech No obvious tremor or mobility impairment Moving neck and UEs normally Able to hear the call well  No cough or shortness of breath during interview  Talkative and mentally sharp with no cognitive changes No skin changes on face or neck , no rash or pallor Affect is normal    Assessment and Plan: Problem List Items Addressed This Visit      Cardiovascular and Mediastinum   Essential hypertension - Primary    bp in fair control at this time  BP Readings from Last 1 Encounters:  04/22/19 115/79   No changes needed Most recent labs reviewed  Disc lifstyle change with low sodium diet and exercise        Relevant Medications   fenofibrate 160 MG tablet   propranolol ER (INDERAL LA) 120 MG 24 hr capsule     Digestive   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    Thought to be related to HSV Doing well/no recurrence Will change her valtrex to acyclovir at this time -cheaper and less of a dose for renal health  inst to update if symptoms worsen/return        Endocrine   Hypothyroidism    TSH continues in tx range  Continue same dose of cytomel and levothyroxine No clinical changes      Relevant Medications   liothyronine (CYTOMEL) 25 MCG tablet   propranolol ER (INDERAL LA) 120 MG 24 hr capsule   SYNTHROID 125 MCG tablet     Genitourinary   Renal insufficiency    Improved cr at 1.49  She is working on hydration  inst to avoid nsaids/advil  Also changing to lower dose anti viral for her HSV         Hematopoietic and Hemostatic    Thrombocytosis (HCC)    Platelet ct continues to decrease-now 474  This is re assuring  No symptoms         Other   Hypertriglyceridemia    Disc goals for lipids and reasons to control them Rev last labs with pt Rev low sat fat diet in detail Triglycerides are down to 144 with fenofibrate and diet  Improved!       Relevant Medications   fenofibrate 160 MG tablet   propranolol ER (INDERAL LA) 120 MG 24 hr capsule   Prediabetes    Lab Results  Component Value Date   HGBA1C 5.6 04/15/2019  down from 5.9  Reassuring disc imp of low glycemic diet and wt loss to prevent DM2        Mild anemia    This continues to improve - with Hb of 11.9 and better energy level           Follow Up Instructions: Your mammogram is due in June - schedule it when you feel safe to go  Take tylenol instead of ibuprofen or aleve for pain-it is easier on the kidneys  Drink lots of water/stay hydrated Aim for 30 minutes of exercise daily - indoors or out  Wear a mask when going into pulblic places  I am changing valtrex back to acyclovir-let me know if you have any problems   Take care of yourself !   I discussed the assessment and treatment plan with the patient. The patient was provided an opportunity to ask questions and all were answered. The patient agreed with the plan and demonstrated an understanding of the instructions.   The patient was advised to call back or seek an in-person evaluation if the symptoms worsen or if the condition fails to improve as anticipated.     Loura Pardon, MD

## 2019-04-23 NOTE — Assessment & Plan Note (Signed)
Lab Results  Component Value Date   HGBA1C 5.6 04/15/2019  down from 5.9  Reassuring disc imp of low glycemic diet and wt loss to prevent DM2

## 2019-04-23 NOTE — Assessment & Plan Note (Signed)
This continues to improve - with Hb of 11.9 and better energy level

## 2019-04-23 NOTE — Assessment & Plan Note (Signed)
Improved cr at 1.49  She is working on hydration  inst to avoid nsaids/advil  Also changing to lower dose anti viral for her HSV

## 2019-04-23 NOTE — Assessment & Plan Note (Signed)
bp in fair control at this time  BP Readings from Last 1 Encounters:  04/22/19 115/79   No changes needed Most recent labs reviewed  Disc lifstyle change with low sodium diet and exercise

## 2019-04-23 NOTE — Assessment & Plan Note (Signed)
TSH continues in tx range  Continue same dose of cytomel and levothyroxine No clinical changes

## 2019-04-23 NOTE — Assessment & Plan Note (Signed)
Platelet ct continues to decrease-now 474  This is re assuring  No symptoms

## 2019-04-23 NOTE — Assessment & Plan Note (Signed)
Disc goals for lipids and reasons to control them Rev last labs with pt Rev low sat fat diet in detail Triglycerides are down to 144 with fenofibrate and diet  Improved!

## 2019-04-23 NOTE — Assessment & Plan Note (Signed)
Thought to be related to HSV Doing well/no recurrence Will change her valtrex to acyclovir at this time -cheaper and less of a dose for renal health  inst to update if symptoms worsen/return

## 2020-01-17 ENCOUNTER — Ambulatory Visit: Payer: Medicare Other | Attending: Internal Medicine

## 2020-01-17 DIAGNOSIS — Z23 Encounter for immunization: Secondary | ICD-10-CM | POA: Insufficient documentation

## 2020-01-17 NOTE — Progress Notes (Signed)
   Covid-19 Vaccination Clinic  Name:  [name]    MRN: [id number] DOB: Jul 13, 1951  01/17/2020  [name] was observed post Covid-19 immunization for 15 minutes without incidence. She was provided with Vaccine Information Sheet and instruction to access the V-Safe system.   [name] was instructed to call 911 with any severe reactions post vaccine: Marland Kitchen Difficulty breathing  . Swelling of your face and throat  . A fast heartbeat  . A bad rash all over your body  . Dizziness and weakness    Immunizations Administered    Name Date Dose VIS Date Route   Pfizer COVID-19 Vaccine 01/17/2020  8:30 AM 0.3 mL 11/19/2019 Intramuscular   Manufacturer: [place]   Lot: [id number]   [place]: [id number]

## 2020-02-09 ENCOUNTER — Ambulatory Visit: Payer: Medicare Other | Attending: Internal Medicine

## 2020-02-09 DIAGNOSIS — Z23 Encounter for immunization: Secondary | ICD-10-CM | POA: Insufficient documentation

## 2020-02-09 NOTE — Progress Notes (Signed)
   Covid-19 Vaccination Clinic  Name:  [name]    MRN: [id number] DOB: [birth year]  02/09/2020  [name] was observed post Covid-19 immunization for 15 minutes without incident. She was provided with Vaccine Information Sheet and instruction to access the V-Safe system.   [name] was instructed to call 911 with any severe reactions post vaccine: Marland Kitchen Difficulty breathing  . Swelling of face and throat  . A fast heartbeat  . A bad rash all over body  . Dizziness and weakness   Immunizations Administered    Name Date Dose VIS Date Route   Pfizer COVID-19 Vaccine 02/09/2020  9:23 AM 0.3 mL 11/19/2019 Intramuscular   Manufacturer: [place]   Lot: [id number]   [place]: [id number]

## 2020-02-12 ENCOUNTER — Other Ambulatory Visit: Payer: Self-pay | Admitting: Family Medicine

## 2020-02-18 ENCOUNTER — Other Ambulatory Visit: Payer: Self-pay | Admitting: Family Medicine

## 2020-02-22 ENCOUNTER — Other Ambulatory Visit: Payer: Self-pay | Admitting: Family Medicine

## 2020-02-22 DIAGNOSIS — Z1231 Encounter for screening mammogram for malignant neoplasm of breast: Secondary | ICD-10-CM

## 2020-03-22 ENCOUNTER — Telehealth: Payer: Self-pay | Admitting: Family Medicine

## 2020-03-22 NOTE — Chronic Care Management (AMB) (Signed)
  Chronic Care Management   Note  03/22/2020 Name: SURIA DEW MRN: [id number] DOB: 24-Apr-1951  Brittinie B Dishman is a 69 y.o. year old female who is a primary care patient of Tower, Wynelle Fanny, MD. I reached out to [employer] by phone today in response to a referral sent by Ms. Emogene B Kruschke's PCP, Tower, Wynelle Fanny, MD.   Ms. Tillmon was given information about Chronic Care Management services today including:  1. CCM service includes personalized support from designated clinical staff supervised by her physician, including individualized plan of care and coordination with other care providers 2. 24/7 contact phone numbers for assistance for urgent and routine care needs. 3. Service will only be billed when office clinical staff spend 20 minutes or more in a month to coordinate care. 4. Only one practitioner may furnish and bill the service in a calendar month. 5. The patient may stop CCM services at any time (effective at the end of the month) by phone call to the office staff.   Patient agreed to services and verbal consent obtained.   Follow up plan:   Raynicia Dukes [employer] Scheduler

## 2020-03-23 ENCOUNTER — Other Ambulatory Visit: Payer: Self-pay

## 2020-03-23 ENCOUNTER — Ambulatory Visit
Admission: RE | Admit: 2020-03-23 | Discharge: 2020-03-23 | Disposition: A | Discharge status: Home / Self Care | Payer: Medicare Other | Source: Ambulatory Visit | Attending: Family Medicine | Admitting: Family Medicine

## 2020-03-23 DIAGNOSIS — Z1231 Encounter for screening mammogram for malignant neoplasm of breast: Secondary | ICD-10-CM

## 2020-03-24 ENCOUNTER — Other Ambulatory Visit: Payer: Self-pay | Admitting: Family Medicine

## 2020-03-24 DIAGNOSIS — R928 Other abnormal and inconclusive findings on diagnostic imaging of breast: Secondary | ICD-10-CM

## 2020-04-04 ENCOUNTER — Ambulatory Visit: Payer: Medicare Other

## 2020-04-04 ENCOUNTER — Ambulatory Visit
Admission: RE | Admit: 2020-04-04 | Discharge: 2020-04-04 | Disposition: A | Discharge status: Home / Self Care | Payer: Medicare Other | Source: Ambulatory Visit | Attending: Family Medicine | Admitting: Family Medicine

## 2020-04-04 ENCOUNTER — Other Ambulatory Visit: Payer: Self-pay

## 2020-04-04 DIAGNOSIS — R922 Inconclusive mammogram: Secondary | ICD-10-CM | POA: Diagnosis not present

## 2020-04-04 DIAGNOSIS — R928 Other abnormal and inconclusive findings on diagnostic imaging of breast: Secondary | ICD-10-CM

## 2020-04-14 ENCOUNTER — Telehealth: Payer: Self-pay

## 2020-04-14 DIAGNOSIS — J45909 Unspecified asthma, uncomplicated: Secondary | ICD-10-CM

## 2020-04-14 DIAGNOSIS — I10 Essential (primary) hypertension: Secondary | ICD-10-CM

## 2020-04-14 NOTE — Telephone Encounter (Signed)
Per written referral from PCP, requesting referral in Epic for Mikalia B Saber to chronic care management pharmacy services for the following conditions:   Essential hypertension, benign  [I10]  ASTHMA [J45.909]  Debbora Dus, PharmD Clinical Pharmacist [facility] [phone]

## 2020-04-17 ENCOUNTER — Other Ambulatory Visit: Payer: Self-pay | Admitting: Family Medicine

## 2020-04-17 NOTE — Chronic Care Management (AMB) (Signed)
Chronic Care Management Pharmacy  Name: NAYLENE GARMENDIA  MRN: [id number] DOB: 04-16-1951  Chief Complaint/ HPI  [name],  69 y.o., female presents for their Initial CCM visit with the clinical pharmacist via telephone.  PCP : Abner Greenspan, MD  Their chronic conditions include: hypertension, hypertriglyceridemia, asthma, allergic rhinitis, hypothyroidism, ulcer of esophagus, renal insufficiency, bladder prolapse, mixed incontinence, pre-diabetes, HSV infection  Patient concerns: denies health and medication concerns; denies medication changes, reports mammogram 3 weeks ago - normal  Office Visits:  04/22/19: [facility] - mammogram June 2020, avoid NSAIDs, stay hydrated, exercise daily, cont current meds  Consult Visit: none in past 6 months  Allergies  Allergen Reactions  . Contrast Media [Iodinated Diagnostic Agents] Anaphylaxis  . Shellfish-Derived Products Anaphylaxis  . Septra [Sulfamethoxazole-Trimethoprim]     Nausea    Medications: Outpatient Encounter Medications as of 04/18/2020  Medication Sig  . acyclovir (ZOVIRAX) 400 MG tablet Take 1 tablet (400 mg total) by mouth daily.  . calcium-vitamin D (OSCAL WITH D) 500-200 MG-UNIT per tablet Take 2 tablets by mouth daily.   . fenofibrate 160 MG tablet TAKE 1 TABLET BY MOUTH  DAILY  . liothyronine (CYTOMEL) 25 MCG tablet TAKE ONE-HALF TABLET BY  MOUTH EVERY EVENING  . montelukast (SINGULAIR) 10 MG tablet TAKE 1 TABLET BY MOUTH  DAILY  . Multiple Vitamin (MULTIVITAMIN) tablet Take 1 tablet by mouth daily.   Marland Kitchen MYRBETRIQ 50 MG TB24 tablet TAKE 1 TABLET BY MOUTH  DAILY  . propranolol ER (INDERAL LA) 120 MG 24 hr capsule TAKE 1 CAPSULE BY MOUTH  DAILY  . SUMAtriptan (IMITREX) 100 MG tablet TAKE 1 TABLET FOR HEADACHE MAY REPEAT ONCE IN 2 HOURS IF NEEDED. MAXIMUM OF 2    TABLETS IN ONE DAY. (Patient taking differently: Take 100 mg by mouth as needed for migraine or headache. TAKE 100 mg TABLET FOR HEADACHE MAY REPEAT ONCE IN 2  HOURS IF NEEDED. MAXIMUM OF 200 mg    TABLETS IN ONE DAY.)  . SYNTHROID 125 MCG tablet TAKE 1 TABLET BY MOUTH IN  THE MORNING  . vitamin B-12 (CYANOCOBALAMIN) 500 MCG tablet Take 500 mcg by mouth daily.    . [DISCONTINUED] liothyronine (CYTOMEL) 25 MCG tablet Take 0.5 tablets (12.5 mcg total) by mouth every evening. Needs generic   No facility-administered encounter medications on file as of 04/18/2020.   Current Diagnosis/Assessment:   Emergency planning/management officer Strain: Low Risk   . Difficulty of Paying Living Expenses: Not hard at all   Goals Addressed            This Visit's Progress   . Pharmacy Care Plan       CARE PLAN ENTRY  Current Barriers:  . Chronic Disease Management support, education, and care coordination needs related to Hypertension and Hyperlipidemia   Hypertension . Pharmacist Clinical Goal(s): o Over the next 6 months, patient will work with PharmD and providers to maintain BP goal <130/80 mmHg . Current regimen:  o Propranolol ER 120 mg - 1 capsule daily . Interventions: o Continue current medications . Patient self care activities - Over the next 6 months, patient will: o Check BP monthly, document, and provide at future appointments o Ensure daily salt intake < 2300 mg/day  Hypertriglyceridemia . Pharmacist Clinical Goal(s): o Over the next 6 months, patient will work with PharmD and providers to maintain triglycerides < 150, LDL < 100, and HDL > 50 . Current regimen:  o Fenofibrate 160 mg - 1 tablet  daily . Interventions: o Due to renal function, recommend decreasing dose of fenofibrate pending consult with Dr. Glori Bickers . Patient self care activities - Over the next 6 months, patient will: o Continue current medications, low fat diet, and routine exercise    Initial goal documentation      Hypertension   CMP Latest Ref Rng & Units 04/15/2019 10/30/2018 10/23/2018  Glucose 70 - 99 mg/dL 90 87 103(H)  BUN 6 - 23 mg/dL 36(H) 30(H) 33(H)  Creatinine 0.40 -  1.20 mg/dL 1.49(H) 1.65(H) 1.89(H)  Sodium 135 - 145 mEq/L 139 134(L) 137  Potassium 3.5 - 5.1 mEq/L 4.2 3.6 5.8(H)  Chloride 96 - 112 mEq/L 105 103 100  CO2 19 - 32 mEq/L 24 23 23   Calcium 8.4 - 10.5 mg/dL 9.2 8.8 10.0  Total Protein 6.0 - 8.3 g/dL 7.3 - -  Total Bilirubin 0.2 - 1.2 mg/dL 0.6 - -  Alkaline Phos 39 - 117 U/L 68 - -  AST 0 - 37 U/L 25 - -  ALT 0 - 35 U/L 22 - -   Kidney Function Lab Results  Component Value Date/Time   CREATININE 1.49 (H) 04/15/2019 08:47 AM   CREATININE 1.65 (H) 10/30/2018 01:51 PM   CREATININE 1.89 (H) 10/23/2018 04:26 PM   GFR 34.76 (L) 04/15/2019 08:47 AM   GFRNONAA 33 (L) 10/17/2018 06:39 AM   GFRAA 38 (L) 10/17/2018 06:39 AM  CrCl: 41 ml/min No UACR  Office blood pressures are: BP Readings from Last 3 Encounters:  04/22/19 115/79  11/24/18 120/72  10/23/18 118/78   Patient has failed these meds in the past: irbesartan/hctz - renal function/hypokalemia Patient checks BP at home: once a month, arm monitor  Patient home BP readings are ranging: 120s/70s  BP goal < 130/80 mmHg Patient is currently controlled on the following medications:   Propranolol ER 120 mg - 1 capsule daily  Note: pt has migraines, although not 1st line for HTN, propranolol may reduce migraine frequency, however, non-selective BB may worsen asthma -- may consider restarting ARB due to renal impairment/pre-DM  Plan: Continue current medications  Hypertriglyceridemia   Lipid Panel     Component Value Date/Time   CHOL 158 04/15/2019 0847   TRIG 144.0 04/15/2019 0847   HDL 73.50 04/15/2019 0847   CHOLHDL 2 04/15/2019 0847   VLDL 28.8 04/15/2019 0847   LDLCALC 56 04/15/2019 0847   LDLDIRECT 90.0 04/08/2018 1013    The 10-year ASCVD risk score Mikey Bussing DC Jr., et al., 2013) is: 8%   Values used to calculate the score:     Age: 69 years     Sex: Female     Is Non-Hispanic African American: No     Diabetic: No     Tobacco smoker: No     Systolic Blood  Pressure: 115 mmHg     Is BP treated: Yes     HDL Cholesterol: 73.5 mg/dL     Total Cholesterol: 158 mg/dL   LDL goal < 100, TG < 150, HDL > 50 Patient has failed these meds in past: none  Patient is currently controlled on the following medications:   Fenofibrate 160 mg - 1 tablet daily  We discussed: no noted history of pancreatitis; denies adverse effects, confirms adherence  Plan: Continue current medications; Per renal function CrCl 30-80 ml/min, lowest dose of fenofibrate is recommended - fenofibrate 54 mg daily.  Hypothyroidism/Grave's Disease   TSH  Date Value Ref Range Status  04/15/2019 0.49 0.35 - 4.50  uIU/mL Final    Patient has failed these meds in past: none reported Patient is currently controlled on the following medications:   Synthroid 125 mcg - 1 tablet daily  Liothyronine 25 mcg - 1/2 tablet every evening   We discussed: denies any symptoms, well controlled   Plan: Continue current medications  Mixed Incontinence   Patient has failed these meds in past: none reported Patient is currently controlled on the following medications:   Myrbetriq 50 mg - 1 tablet daily  We discussed: helpful, no concerns; GFR > 30 ml/min, dose appropriate  Plan: Continue current medications   Pre- Diabetes   Recent Relevant Labs: Lab Results  Component Value Date/Time   HGBA1C 5.6 04/15/2019 08:47 AM   HGBA1C 5.9 04/08/2018 10:13 AM    Checking BG: none Patient has failed these meds in past: none Patient is currently controlled on the following medications:   No pharmacotherapy  Exercise: daily time spent gardening  Plan: Continue control with diet and exercise  Allergies/Asthma   Patient has failed these meds in past: none  Patient is currently controlled on the following medications:   Montelukast 10 mg - 1 tablet daily  We discussed: seasonal allergies, takes PRN oral antihistamine if symptoms worsen, denies inhaler use  Plan: Continue current  medications   Migraines   Migraine frequency: once a month Symptoms/duration: headache, nausea - usually last about 1 day Patient has failed these meds in past:  Patient is currently controlled on the following medications:   Sumatriptan 100 mg - 1 tablet PRN   We discussed: usually one tablet is effective; avoid NSAIDs due to renal insufficiency  Plan: Continue current medications  Medication Management  Misc: Acyclovir 400 mg - 1 daily for cold sores   OTCs: calcium-vitamin D 500mg -200 IU - 2 tabs daily, multivitamin (Centrum), vitamin B12 500 mcg - daily, vitamin C, Tylenol 500 mg PRN  Pharmacy/Benefits: [facility] order  Adherence: pillbox, denies missed doses  Social support: family support  Affordability: denies concerns  CCM Follow Up: 10/19/20 at 9:30 AM - 6 months (telephone)  Debbora Dus, PharmD Clinical Pharmacist [facility] at [facility] [phone]

## 2020-04-17 NOTE — Telephone Encounter (Signed)
Referral placed.

## 2020-04-18 ENCOUNTER — Ambulatory Visit: Payer: Medicare Other

## 2020-04-18 ENCOUNTER — Telehealth: Payer: Self-pay | Admitting: Family Medicine

## 2020-04-18 ENCOUNTER — Telehealth: Payer: Self-pay

## 2020-04-18 ENCOUNTER — Other Ambulatory Visit: Payer: Self-pay

## 2020-04-18 DIAGNOSIS — E781 Pure hyperglyceridemia: Secondary | ICD-10-CM

## 2020-04-18 DIAGNOSIS — D75839 Thrombocytosis, unspecified: Secondary | ICD-10-CM

## 2020-04-18 DIAGNOSIS — E039 Hypothyroidism, unspecified: Secondary | ICD-10-CM

## 2020-04-18 DIAGNOSIS — I10 Essential (primary) hypertension: Secondary | ICD-10-CM

## 2020-04-18 DIAGNOSIS — D649 Anemia, unspecified: Secondary | ICD-10-CM

## 2020-04-18 DIAGNOSIS — R7303 Prediabetes: Secondary | ICD-10-CM

## 2020-04-18 DIAGNOSIS — D473 Essential (hemorrhagic) thrombocythemia: Secondary | ICD-10-CM

## 2020-04-18 MED ORDER — FENOFIBRATE 54 MG PO TABS: 54.0000 mg | ORAL_TABLET | Freq: Every day | ORAL | 3 refills | Status: DC

## 2020-04-18 NOTE — Telephone Encounter (Signed)
Please let pt know that our pharmacist recommended we reduce her fenofibrate dose to 54 mg (to be easier on kidneys) I pended the px to send to pharmacy of choice   We will see how labs look with next appt

## 2020-04-18 NOTE — Patient Instructions (Addendum)
Apr 18, 2020  Dear [name],  It was a pleasure meeting you during our initial appointment on Apr 18, 2020. Below is a summary of the goals we discussed and components of chronic care management. Please contact me anytime with questions or concerns.   Visit Information  Goals Addressed            This Visit's Progress   . Pharmacy Care Plan       CARE PLAN ENTRY  Current Barriers:  . Chronic Disease Management support, education, and care coordination needs related to Hypertension and Hyperlipidemia   Hypertension . Pharmacist Clinical Goal(s): o Over the next 6 months, patient will work with PharmD and providers to maintain BP goal <130/80 mmHg . Current regimen:  o Propranolol ER 120 mg - 1 capsule daily . Interventions: o Continue current medications . Patient self care activities - Over the next 6 months, patient will: o Check BP monthly, document, and provide at future appointments o Ensure daily salt intake < 2300 mg/day  Hypertriglyceridemia . Pharmacist Clinical Goal(s): o Over the next 6 months, patient will work with PharmD and providers to maintain triglycerides < 150, LDL < 100, and HDL > 50 . Current regimen:  o Fenofibrate 160 mg - 1 tablet daily . Interventions: o Due to renal function, recommend decreasing dose of fenofibrate pending consult with Dr. Glori Bickers . Patient self care activities - Over the next 6 months, patient will: o Continue current medications, low fat diet, and routine exercise    Initial goal documentation      [name] was given information about Chronic Care Management services today including:  1. CCM service includes personalized support from designated clinical staff supervised by her physician, including individualized plan of care and coordination with other care providers 2. 24/7 contact phone numbers for assistance for urgent and routine care needs. 3. Standard insurance, coinsurance, copays and deductibles apply for  chronic care management only during months in which we provide at least 20 minutes of these services. Most insurances cover these services at 100%, however patients may be responsible for any copay, coinsurance and/or deductible if applicable. This service may help you avoid the need for more expensive face-to-face services. 4. Only one practitioner may furnish and bill the service in a calendar month. 5. The patient may stop CCM services at any time (effective at the end of the month) by phone call to the office staff.  Patient agreed to services and verbal consent obtained.   The patient verbalized understanding of instructions provided today and agreed to receive a mailed copy of patient instruction and/or educational materials. Telephone follow up appointment with pharmacy team member scheduled for: 10/19/20 at 9:30 AM (telephone)  Debbora Dus, PharmD Clinical Pharmacist [facility] [place]  [place] refers to food and lifestyle choices that are based on the traditions of countries located on the [place]. This way of eating has been shown to help prevent certain conditions and improve outcomes for people who have chronic diseases, like kidney disease and heart disease. What are tips for following this plan? Lifestyle  Cook and eat meals together with your family, when possible.  Drink enough fluid to keep your urine clear or pale yellow.  Be physically active every day. This includes: ? Aerobic exercise like running or swimming. ? Leisure activities like gardening, walking, or housework.  Get 7-8 hours of sleep each night.  If recommended by your health  care provider, drink red wine in moderation. This means 1 glass a day for nonpregnant women and 2 glasses a day for men. A glass of wine equals 5 oz (150 mL). Reading food labels   Check the serving size of packaged foods. For foods such as rice and pasta,  the serving size refers to the amount of cooked product, not dry.  Check the total fat in packaged foods. Avoid foods that have saturated fat or trans fats.  Check the ingredients list for added sugars, such as corn syrup. Shopping  At the grocery store, buy most of your food from the areas near the walls of the store. This includes: ? Fresh fruits and vegetables (produce). ? Grains, beans, nuts, and seeds. Some of these may be available in unpackaged forms or large amounts (in bulk). ? Fresh seafood. ? Poultry and eggs. ? Low-fat dairy products.  Buy whole ingredients instead of prepackaged foods.  Buy fresh fruits and vegetables in-season from local farmers markets.  Buy frozen fruits and vegetables in resealable bags.  If you do not have access to quality fresh seafood, buy precooked frozen shrimp or canned fish, such as tuna, salmon, or sardines.  Buy small amounts of raw or cooked vegetables, salads, or olives from the deli or salad bar at your store.  Stock your pantry so you always have certain foods on hand, such as olive oil, canned tuna, canned tomatoes, rice, pasta, and beans. Cooking  Cook foods with extra-virgin olive oil instead of using butter or other vegetable oils.  Have meat as a side dish, and have vegetables or grains as your main dish. This means having meat in small portions or adding small amounts of meat to foods like pasta or stew.  Use beans or vegetables instead of meat in common dishes like chili or lasagna.  Experiment with different cooking methods. Try roasting or broiling vegetables instead of steaming or sauteing them.  Add frozen vegetables to soups, stews, pasta, or rice.  Add nuts or seeds for added healthy fat at each meal. You can add these to yogurt, salads, or vegetable dishes.  Marinate fish or vegetables using olive oil, lemon juice, garlic, and fresh herbs. Meal planning   Plan to eat 1 vegetarian meal one day each week. Try to  work up to 2 vegetarian meals, if possible.  Eat seafood 2 or more times a week.  Have healthy snacks readily available, such as: ? Vegetable sticks with hummus. ? [place] yogurt. ? Fruit and nut trail mix.  Eat balanced meals throughout the week. This includes: ? Fruit: 2-3 servings a day ? Vegetables: 4-5 servings a day ? Low-fat dairy: 2 servings a day ? Fish, poultry, or lean meat: 1 serving a day ? Beans and legumes: 2 or more servings a week ? Nuts and seeds: 1-2 servings a day ? Whole grains: 6-8 servings a day ? Extra-virgin olive oil: 3-4 servings a day  Limit red meat and sweets to only a few servings a month What are my food choices?  Mediterranean diet ? Recommended  Grains: Whole-grain pasta. Brown rice. Bulgar wheat. Polenta. Couscous. Whole-wheat bread. [place].  Vegetables: Artichokes. Beets. Broccoli. Cabbage. Carrots. Eggplant. Green beans. Chard. Kale. Spinach. Onions. Leeks. Peas. Squash. Tomatoes. Peppers. Radishes.  Fruits: Apples. Apricots. Avocado. Berries. Bananas. Cherries. Dates. Figs. Grapes. Lemons. Melon. Oranges. Peaches. Plums. Pomegranate.  Meats and other protein foods: Beans. Almonds. Sunflower seeds. Pine nuts. Peanuts. [place]. Salmon. Scallops. Shrimp. [place]. Tilapia. Clams. Oysters.  Eggs.  Dairy: Low-fat milk. Cheese. Greek yogurt.  Beverages: Water. Red wine. Herbal tea.  Fats and oils: Extra virgin olive oil. Avocado oil. Grape seed oil.  Sweets and desserts: [place] yogurt with honey. Baked apples. Poached pears. Trail mix.  Seasoning and other foods: Basil. Cilantro. Coriander. Cumin. Mint. Parsley. Sage. Rosemary. Tarragon. Garlic. Oregano. Thyme. Pepper. Balsalmic vinegar. Tahini. Hummus. Tomato sauce. Olives. Mushrooms. ? Limit these  Grains: Prepackaged pasta or rice dishes. Prepackaged cereal with added sugar.  Vegetables: Deep fried potatoes (french fries).  Fruits: Fruit canned in syrup.  Meats and other protein foods:  Beef. Pork. Lamb. Poultry with skin. Hot dogs. [place] Salines.  Dairy: Ice cream. Sour cream. Whole milk.  Beverages: Juice. Sugar-sweetened soft drinks. Beer. Liquor and spirits.  Fats and oils: Butter. Canola oil. Vegetable oil. Beef fat (tallow). Lard.  Sweets and desserts: Cookies. Cakes. Pies. Candy.  Seasoning and other foods: Mayonnaise. Premade sauces and marinades. The items listed may not be a complete list. Talk with your dietitian about what dietary choices are right for you. Summary  The Mediterranean diet includes both food and lifestyle choices.  Eat a variety of fresh fruits and vegetables, beans, nuts, seeds, and whole grains.  Limit the amount of red meat and sweets that you eat.  Talk with your health care provider about whether it is safe for you to drink red wine in moderation. This means 1 glass a day for nonpregnant women and 2 glasses a day for men. A glass of wine equals 5 oz (150 mL). This information is not intended to replace advice given to you by your health care provider. Make sure you discuss any questions you have with your health care provider. Document Revised: 07/25/2016 Document Reviewed: 07/18/2016 Elsevier Patient Education  [place].

## 2020-04-18 NOTE — Telephone Encounter (Signed)
PCP consult regarding CCM visit 04/18/20:  Kidney Function Lab Results  Component Value Date/Time   CREATININE 1.49 (H) 04/15/2019 08:47 AM   CREATININE 1.65 (H) 10/30/2018 01:51 PM   CREATININE 1.89 (H) 10/23/2018 04:26 PM   GFR 34.76 (L) 04/15/2019 08:47 AM   GFRNONAA 33 (L) 10/17/2018 06:39 AM   GFRAA 38 (L) 10/17/2018 06:39 AM   CrCl: 41 ml/min (actual body weight, Scr 1.49) Per review of medication safety in CKD, lowest dose of fenofibrate is recommended if CrCl 30-80 ml/min. Recommend reducing current dose of fenofibrate160 mg daily to fenofibrate 54 mg daily or discontinuation and monitoring.   Debbora Dus, PharmD Clinical Pharmacist [facility] at [facility] [phone]

## 2020-04-18 NOTE — Telephone Encounter (Signed)
Pt notified of Dr. Marliss Coots comments and new Rx sent to pharmacy

## 2020-04-18 NOTE — Telephone Encounter (Signed)
-----   Message from Ellamae Sia sent at 04/13/2020  9:51 AM EDT ----- Regarding: Lab orders for Wednesday, 5.19.21 Patient is scheduled for CPX labs, please order future labs, Thanks , Karna Christmas

## 2020-04-20 NOTE — Progress Notes (Signed)
I have collaborated with the care management provider regarding care management and care coordination activities outlined in this encounter and have reviewed this encounter including documentation in the note and care plan. I am certifying that I agree with the content of this note and encounter as supervising physician. Delana Manganello MD  

## 2020-04-26 ENCOUNTER — Other Ambulatory Visit: Payer: Medicare Other

## 2020-04-26 ENCOUNTER — Other Ambulatory Visit (INDEPENDENT_AMBULATORY_CARE_PROVIDER_SITE_OTHER): Payer: Medicare Other

## 2020-04-26 ENCOUNTER — Ambulatory Visit (INDEPENDENT_AMBULATORY_CARE_PROVIDER_SITE_OTHER): Payer: Medicare Other

## 2020-04-26 VITALS — BP 138/78 | Wt 177.0 lb

## 2020-04-26 DIAGNOSIS — I10 Essential (primary) hypertension: Secondary | ICD-10-CM

## 2020-04-26 DIAGNOSIS — D75839 Thrombocytosis, unspecified: Secondary | ICD-10-CM

## 2020-04-26 DIAGNOSIS — R7303 Prediabetes: Secondary | ICD-10-CM | POA: Diagnosis not present

## 2020-04-26 DIAGNOSIS — D473 Essential (hemorrhagic) thrombocythemia: Secondary | ICD-10-CM

## 2020-04-26 DIAGNOSIS — E039 Hypothyroidism, unspecified: Secondary | ICD-10-CM

## 2020-04-26 DIAGNOSIS — E781 Pure hyperglyceridemia: Secondary | ICD-10-CM

## 2020-04-26 DIAGNOSIS — D649 Anemia, unspecified: Secondary | ICD-10-CM

## 2020-04-26 DIAGNOSIS — Z0000 Encounter for general adult medical examination without abnormal findings: Secondary | ICD-10-CM

## 2020-04-26 LAB — LIPID PANEL
Cholesterol: 182 mg/dL (ref 0–200)
HDL: 69.2 mg/dL (ref >39.00)
NonHDL: 112.86
Total CHOL/HDL Ratio: 3
Triglycerides: 212 mg/dL — ABNORMAL HIGH (ref 0.0–149.0)
VLDL: 42.4 mg/dL — ABNORMAL HIGH (ref 0.0–40.0)

## 2020-04-26 LAB — CBC WITH DIFFERENTIAL/PLATELET
Basophils Absolute: 0.1 10*3/uL (ref 0.0–0.1)
Basophils Relative: 1 % (ref 0.0–3.0)
Eosinophils Absolute: 0.3 10*3/uL (ref 0.0–0.7)
Eosinophils Relative: 4 % (ref 0.0–5.0)
HCT: 37 % (ref 36.0–46.0)
Hemoglobin: 12.4 g/dL (ref 12.0–15.0)
Lymphocytes Relative: 38.3 % (ref 12.0–46.0)
Lymphs Abs: 2.8 10*3/uL (ref 0.7–4.0)
MCHC: 33.5 g/dL (ref 30.0–36.0)
MCV: 90 fl (ref 78.0–100.0)
Monocytes Absolute: 0.5 10*3/uL (ref 0.1–1.0)
Monocytes Relative: 6.2 % (ref 3.0–12.0)
Neutro Abs: 3.8 10*3/uL (ref 1.4–7.7)
Neutrophils Relative %: 50.5 % (ref 43.0–77.0)
Platelets: 487 10*3/uL — ABNORMAL HIGH (ref 150.0–400.0)
RBC: 4.12 Mil/uL (ref 3.87–5.11)
RDW: 13.9 % (ref 11.5–15.5)
WBC: 7.4 10*3/uL (ref 4.0–10.5)

## 2020-04-26 LAB — FERRITIN: Ferritin: 167.5 ng/mL (ref 10.0–291.0)

## 2020-04-26 LAB — COMPREHENSIVE METABOLIC PANEL
ALT: 10 U/L (ref 0–35)
AST: 17 U/L (ref 0–37)
Albumin: 4.4 g/dL (ref 3.5–5.2)
Alkaline Phosphatase: 70 U/L (ref 39–117)
BUN: 35 mg/dL — ABNORMAL HIGH (ref 6–23)
CO2: 26 mEq/L (ref 19–32)
Calcium: 9.5 mg/dL (ref 8.4–10.5)
Chloride: 104 mEq/L (ref 96–112)
Creatinine, Ser: 1.68 mg/dL — ABNORMAL HIGH (ref 0.40–1.20)
GFR: 30.17 mL/min — ABNORMAL LOW (ref >60.00)
Glucose, Bld: 88 mg/dL (ref 70–99)
Potassium: 4.6 mEq/L (ref 3.5–5.1)
Sodium: 135 mEq/L (ref 135–145)
Total Bilirubin: 0.5 mg/dL (ref 0.2–1.2)
Total Protein: 7.4 g/dL (ref 6.0–8.3)

## 2020-04-26 LAB — LDL CHOLESTEROL, DIRECT: Direct LDL: 77 mg/dL

## 2020-04-26 LAB — HEMOGLOBIN A1C: Hgb A1c MFr Bld: 5.6 % (ref 4.6–6.5)

## 2020-04-26 LAB — TSH: TSH: 12.4 u[IU]/mL — ABNORMAL HIGH (ref 0.35–4.50)

## 2020-04-26 NOTE — Progress Notes (Signed)
PCP notes:  Health Maintenance: No gaps noted   Abnormal Screenings: none   Patient concerns: none   Nurse concerns: none   Next PCP appt.: 05/01/2020 @ 12 pm

## 2020-04-26 NOTE — Progress Notes (Signed)
Subjective:   [name] is a 69 y.o. female who presents for Medicare Annual (Subsequent) preventive examination.  Review of Systems: N/A   I connected with the patient today by telephone and verified that I am speaking with the correct person using two identifiers. Location patient: home Location nurse: work Persons participating in the virtual visit: patient, Marine scientist.   I discussed the limitations, risks, security and privacy concerns of performing an evaluation and management service by telephone and the availability of in person appointments. I also discussed with the patient that there may be a patient responsible charge related to this service. The patient expressed understanding and verbally consented to this telephonic visit.    Interactive audio and video telecommunications were attempted between this nurse and patient, however failed, due to patient having technical difficulties OR patient did not have access to video capability.  We continued and completed visit with audio only.     Cardiac Risk Factors include: advanced age (>75men, >31 women);hypertension     Objective:     Vitals: BP 138/78   Wt 177 lb (80.3 kg)   BMI 28.57 kg/m   Body mass index is 28.57 kg/m.  Advanced Directives 04/26/2020 04/14/2019 10/14/2018 05/19/2018 04/08/2018 04/04/2017 10/18/2011  Does Patient Have a Medical Advance Directive? Yes Yes Yes Yes Yes Yes Patient has advance directive, copy not in chart  Type of Advance Directive [place];Living will [place];Living will [place];Living will Healthcare Power of [place];Living will [place];Living will -  Does patient want to make changes to medical advance directive? - - No - Patient declined - - - -  Copy of [place] in Chart? Yes - validated most recent copy scanned in chart (See row information) Yes - validated most  recent copy scanned in chart (See row information) Yes - validated most recent copy scanned in chart (See row information) - Yes Yes -    Tobacco Social History   Tobacco Use  Smoking Status Never Smoker  Smokeless Tobacco Never Used     Counseling given: Not Answered   Clinical Intake:  Pre-visit preparation completed: Yes  Pain : No/denies pain     Nutritional Risks: None Diabetes: No  How often do you need to have someone help you when you read instructions, pamphlets, or other written materials from your doctor or pharmacy?: 1 - Never What is the last grade level you completed in school?: bachelors  Interpreter Needed?: No  Information entered by :: cjohnson, lpn  Past Medical History:  Diagnosis Date  . Allergic rhinitis   . Allergy   . Anemia    b12 def.  . Asthma    controlled w/ singulair  . Herpes simplex without mention of complication    controlled w/ meds (no current fever blisters)  . History of kidney stones   . HLD (hyperlipidemia)    borderline-  no meds  . HTN (hypertension)    echo- 2006  . Hyperthyroidism hx ptu 2006   takes synthroid; had Graves disease, was treated for that- now thyroid doesn't work  . Leukocytosis, unspecified hx of 10 yrs ago   no problem since  . Migraine   . Mixed incontinence    urge and stress incontinence  . Neuromuscular disorder ([place])    hiatal hernia  . Renal cyst right    pt is unsure of this, has had kidney stones  .  Ureteral calculi right    s/p laser  litho w/ stone extraction 01-28-11   Past Surgical History:  Procedure Laterality Date  . BIOPSY  10/16/2018   Procedure: BIOPSY;  Surgeon: Milus Banister, MD;  Location: [facility] ENDOSCOPY;  Service: Endoscopy;;  . [place]  . CERVICAL FUSION  2000   fusion c4-6  . CERVICAL FUSION  2005   fusion c6-7 and plate removal 624THL  . COLONOSCOPY    . CYSTOSCOPY W/ RETROGRADES  10/21/2011   Procedure: CYSTOSCOPY WITH RETROGRADE PYELOGRAM;   Surgeon: Ailene Rud, MD;  Location: [facility];  Service: Urology;  Laterality: Right;  CYSTOSCOPY RIGHT RETROGRADE, PYELOGRAM  URETEROSCOPY WITH HOLMIUM LASER AND STONE EXTRACTION  . CYSTOSCOPY/RETROGRADE/URETEROSCOPY/STONE EXTRACTION WITH BASKET  01-28-11   right   . ESOPHAGOGASTRODUODENOSCOPY (EGD) WITH PROPOFOL N/A 10/16/2018   Procedure: ESOPHAGOGASTRODUODENOSCOPY (EGD) WITH PROPOFOL;  Surgeon: Milus Banister, MD;  Location[facility] ENDOSCOPY;  Service: Endoscopy;  Laterality: N/A;  . REDUCTION MAMMAPLASTY Bilateral 1995  . TONSILLECTOMY  1960  . VAGINAL HYSTERECTOMY  1987   fibroids/anemia   Family History  Problem Relation Age of Onset  . Heart failure Father   . Osteoporosis Mother   . Diabetes Mother   . Diabetes Other        Grandfather  . Coronary artery disease Other        Grandmother  . Breast cancer Neg Hx   . Colon cancer Neg Hx   . Esophageal cancer Neg Hx   . Stomach cancer Neg Hx   . Rectal cancer Neg Hx    Social History   Socioeconomic History  . Marital status: Married    Spouse name: Not on file  . Number of children: 1  . Years of education: Not on file  . Highest education level: Not on file  Occupational History  . Occupation: Publishing rights manager: [employer]  Tobacco Use  . Smoking status: Never Smoker  . Smokeless tobacco: Never Used  Substance and Sexual Activity  . Alcohol use: No    Alcohol/week: 0.0 standard drinks  . Drug use: No  . Sexual activity: Not on file  Other Topics Concern  . 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 Difficulty of Paying Living Expenses: Not hard at all  Food Insecurity: No Food Insecurity  . Worried About Charity fundraiser in the Last Year: Never true  . Ran Out of Food in the Last Year: Never true  Transportation Needs:  No Transportation Needs  . Lack of Transportation (Medical): No  . Lack of Transportation (Non-Medical): No  Physical Activity: Sufficiently Active  . Days of Exercise per Week: 3 days  . Minutes of Exercise per Session: 50 min  Stress: No Stress Concern Present  . Feeling of Stress : Not at all  Social Connections:   . Frequency of Communication with Friends and Family:   . Frequency of Social Gatherings with Friends and Family:   . Attends Religious Services:   . Active Member of Clubs or Organizations:   . Attends Archivist Meetings:   Marland Kitchen Marital Status:     Outpatient Encounter Medications as of 04/26/2020  Medication Sig  . acyclovir (  ZOVIRAX) 400 MG tablet Take 1 tablet (400 mg total) by mouth daily.  . calcium-vitamin D (OSCAL WITH D) 500-200 MG-UNIT per tablet Take 2 tablets by mouth daily.   . fenofibrate 54 MG tablet Take 1 tablet (54 mg total) by mouth daily.  Marland Kitchen liothyronine (CYTOMEL) 25 MCG tablet TAKE ONE-HALF TABLET BY  MOUTH EVERY EVENING  . montelukast (SINGULAIR) 10 MG tablet TAKE 1 TABLET BY MOUTH  DAILY  . Multiple Vitamin (MULTIVITAMIN) tablet Take 1 tablet by mouth daily.   Marland Kitchen MYRBETRIQ 50 MG TB24 tablet TAKE 1 TABLET BY MOUTH  DAILY  . propranolol ER (INDERAL LA) 120 MG 24 hr capsule TAKE 1 CAPSULE BY MOUTH  DAILY  . SUMAtriptan (IMITREX) 100 MG tablet TAKE 1 TABLET FOR HEADACHE MAY REPEAT ONCE IN 2 HOURS IF NEEDED. MAXIMUM OF 2    TABLETS IN ONE DAY. (Patient taking differently: Take 100 mg by mouth as needed for migraine or headache. TAKE 100 mg TABLET FOR HEADACHE MAY REPEAT ONCE IN 2 HOURS IF NEEDED. MAXIMUM OF 200 mg    TABLETS IN ONE DAY.)  . SYNTHROID 125 MCG tablet TAKE 1 TABLET BY MOUTH IN  THE MORNING  . vitamin B-12 (CYANOCOBALAMIN) 500 MCG tablet Take 500 mcg by mouth daily.     No facility-administered encounter medications on file as of 04/26/2020.    Activities of Daily Living In your present state of health, do you have any difficulty  performing the following activities: 04/26/2020  Hearing? N  Vision? N  Difficulty concentrating or making decisions? N  Walking or climbing stairs? N  Dressing or bathing? N  Doing errands, shopping? N  Preparing Food and eating ? N  Using the Toilet? N  In the past six months, have you accidently leaked urine? N  Do you have problems with loss of bowel control? N  Managing your Medications? N  Managing your Finances? N  Housekeeping or managing your Housekeeping? N  Some recent data might be hidden    Patient Care Team: Tower, Wynelle Fanny, MD as PCP - General Debbora Dus, [facility] as Pharmacist (Pharmacist)    Assessment:   This is a routine wellness examination for [place].  Exercise Activities and Dietary recommendations Current Exercise Habits: The patient does not participate in regular exercise at present, Exercise limited by: None identified  Goals    . Patient Stated     Starting 04/14/2019, I will continue to take medications as prescribed.     . Patient Stated     04/26/2020, I will continue to walk 3 days a week for 45 minutes.     . Pharmacy Care Plan     CARE PLAN ENTRY  Current Barriers:  . Chronic Disease Management support, education, and care coordination needs related to Hypertension and Hyperlipidemia   Hypertension . Pharmacist Clinical Goal(s): o Over the next 6 months, patient will work with PharmD and providers to maintain BP goal <130/80 mmHg . Current regimen:  o Propranolol ER 120 mg - 1 capsule daily . Interventions: o Continue current medications . Patient self care activities - Over the next 6 months, patient will: o Check BP monthly, document, and provide at future appointments o Ensure daily salt intake < 2300 mg/day  Hypertriglyceridemia . Pharmacist Clinical Goal(s): o Over the next 6 months, patient will work with PharmD and providers to maintain triglycerides < 150, LDL < 100, and HDL > 50 . Current regimen:  o Fenofibrate 160 mg - 1  tablet  daily . Interventions: o Due to renal function, recommend decreasing dose of fenofibrate pending consult with Dr. Glori Bickers . Patient self care activities - Over the next 6 months, patient will: o Continue current medications, low fat diet, and routine exercise    Initial goal documentation       Fall Risk Fall Risk  04/26/2020 04/14/2019 04/08/2018 04/04/2017 04/03/2016  Falls in the past year? 0 0 No No No  Number falls in past yr: 0 - - - -  Injury with Fall? 0 - - - -  Risk for fall due to : Medication side effect - - - -  Follow up Falls evaluation completed;Falls prevention discussed - - - -   Is the patient's home free of loose throw rugs in walkways, pet beds, electrical cords, etc?   yes      Grab bars in the bathroom? no      Handrails on the stairs?   yes      Adequate lighting?   yes  Timed Get Up and Go performed: N/A  Depression Screen PHQ 2/9 Scores 04/26/2020 04/14/2019 04/08/2018 04/04/2017  PHQ - 2 Score 0 0 0 0  PHQ- 9 Score 0 0 0 -     Cognitive Function MMSE - Mini Mental State Exam 04/26/2020 04/14/2019 04/08/2018 04/04/2017  Orientation to time 5 5 5 5   Orientation to Place 5 5 5 5   Registration 3 3 3 3   Attention/ Calculation 5 0 0 0  Recall 3 3 3 3   Language- name 2 objects - 0 0 0  Language- repeat 1 1 1 1   Language- follow 3 step command - 0 3 3  Language- read & follow direction - 0 0 0  Write a sentence - 0 0 0  Copy design - 0 0 0  Total score - 17 20 20   Mini Cog  Mini-Cog screen was completed. Maximum score is 22. A value of 0 denotes this part of the MMSE was not completed or the patient failed this part of the Mini-Cog screening.       Immunization History  Administered Date(s) Administered  . Influenza Split 08/10/2011  . Influenza Whole 10/10/2007, 08/17/2008, 08/31/2009, 08/09/2010  . Influenza, High Dose Seasonal PF 07/18/2017, 07/09/2018, 08/06/2019  . Influenza,inj,Quad PF,6+ Mos 08/04/2015, 09/02/2016  . Influenza-Unspecified  08/09/2014  . PFIZER SARS-COV-2 Vaccination 01/17/2020, 02/09/2020  . Pneumococcal Conjugate-13 11/28/2015  . Pneumococcal Polysaccharide-23 08/25/2003, 10/10/2007  . Pneumococcal-Unspecified 11/27/2016  . Td 10/21/2006  . Tdap 03/20/2015  . Zoster 03/25/2012  . Zoster Recombinat (Shingrix) 09/04/2018, 12/09/2018    Qualifies for Shingles Vaccine: Completed series  Screening Tests Health Maintenance  Topic Date Due  . INFLUENZA VACCINE  07/09/2020  . MAMMOGRAM  03/23/2021  . TETANUS/TDAP  03/19/2025  . COLONOSCOPY  05/19/2028  . DEXA SCAN  Completed  . COVID-19 Vaccine  Completed  . Hepatitis C Screening  Completed  . PNA vac Low Risk Adult  Completed    Cancer Screenings: Lung: Low Dose CT Chest recommended if Age 31-80 years, 30 pack-year currently smoking OR have quit w/in 15 years. Patient does not qualify. Breast:  Up to date on Mammogram: Yes, completed 04/04/2020   Up to date of Bone Density/Dexa: Yes, completed 05/25/2018 Colorectal: completed 05/19/2018  Additional Screenings:  Hepatitis C Screening: 04/06/2007     Plan:   Patient will continue to walk 3 days a week for 45 minutes.    I have personally reviewed and noted the following in  the patient's chart:   . Medical and social history . Use of alcohol, tobacco or illicit drugs  . Current medications and supplements . Functional ability and status . Nutritional status . Physical activity . Advanced directives . List of other physicians . Hospitalizations, surgeries, and ER visits in previous 12 months . Vitals . Screenings to include cognitive, depression, and falls . Referrals and appointments  In addition, I have reviewed and discussed with patient certain preventive protocols, quality metrics, and best practice recommendations. A written personalized care plan for preventive services as well as general preventive health recommendations were provided to patient.     Andrez Grime,  LPN  [id number]

## 2020-04-26 NOTE — Patient Instructions (Signed)
[name] , Thank you for taking time to come for your Medicare Wellness Visit. I appreciate your ongoing commitment to your health goals. Please review the following plan we discussed and let me know if I can assist you in the future.   Screening recommendations/referrals: Colonoscopy: Up to date, completed 05/19/2018 Mammogram: Up to date, completed 04/04/2020 Bone Density: Up to date, completed 05/25/2018 Recommended yearly ophthalmology/optometry visit for glaucoma screening and checkup Recommended yearly dental visit for hygiene and checkup  Vaccinations: Influenza vaccine: Up to date, completed 08/06/2019 Pneumococcal vaccine: Completed series Tdap vaccine: Up to date, completed 03/20/2015 Shingles vaccine: Completed series    Advanced directives: Please bring a copy of your POA (Power of [place]) and/or Living Will to your next appointment.   Conditions/risks identified: hypertension  Next appointment: 05/01/2020 @ 12 pm    Preventive Care 65 Years and Older, Female Preventive care refers to lifestyle choices and visits with your health care provider that can promote health and wellness. What does preventive care include?  A yearly physical exam. This is also called an annual well check.  Dental exams once or twice a year.  Routine eye exams. Ask your health care provider how often you should have your eyes checked.  Personal lifestyle choices, including:  Daily care of your teeth and gums.  Regular physical activity.  Eating a healthy diet.  Avoiding tobacco and drug use.  Limiting alcohol use.  Practicing safe sex.  Taking low-dose aspirin every day.  Taking vitamin and mineral supplements as recommended by your health care provider. What happens during an annual well check? The services and screenings done by your health care provider during your annual well check will depend on your age, overall health, lifestyle risk factors, and family history of  disease. Counseling  Your health care provider may ask you questions about your:  Alcohol use.  Tobacco use.  Drug use.  Emotional well-being.  Home and relationship well-being.  Sexual activity.  Eating habits.  History of falls.  Memory and ability to understand (cognition).  Work and work Statistician.  Reproductive health. Screening  You may have the following tests or measurements:  Height, weight, and BMI.  Blood pressure.  Lipid and cholesterol levels. These may be checked every 5 years, or more frequently if you are over 70 years old.  Skin check.  Lung cancer screening. You may have this screening every year starting at age 12 if you have a 30-pack-year history of smoking and currently smoke or have quit within the past 15 years.  Fecal occult blood test (FOBT) of the stool. You may have this test every year starting at age 75.  Flexible sigmoidoscopy or colonoscopy. You may have a sigmoidoscopy every 5 years or a colonoscopy every 10 years starting at age 17.  Hepatitis C blood test.  Hepatitis B blood test.  Sexually transmitted disease (STD) testing.  Diabetes screening. This is done by checking your blood sugar (glucose) after you have not eaten for a while (fasting). You may have this done every 1-3 years.  Bone density scan. This is done to screen for osteoporosis. You may have this done starting at age 68.  Mammogram. This may be done every 1-2 years. Talk to your health care provider about how often you should have regular mammograms. Talk with your health care provider about your test results, treatment options, and if necessary, the need for more tests. Vaccines  Your health care provider may recommend certain vaccines, such as:  Influenza vaccine. This is recommended every year.  Tetanus, diphtheria, and acellular pertussis (Tdap, Td) vaccine. You may need a Td booster every 10 years.  Zoster vaccine. You may need this after age  7.  Pneumococcal 13-valent conjugate (PCV13) vaccine. One dose is recommended after age 54.  Pneumococcal polysaccharide (PPSV23) vaccine. One dose is recommended after age 41. Talk to your health care provider about which screenings and vaccines you need and how often you need them. This information is not intended to replace advice given to you by your health care provider. Make sure you discuss any questions you have with your health care provider. Document Released: 12/22/2015 Document Revised: 08/14/2016 Document Reviewed: 09/26/2015 Elsevier Interactive Patient Education  2017 [place] Prevention in the Home Falls can cause injuries. They can happen to people of all ages. There are many things you can do to make your home safe and to help prevent falls. What can I do on the outside of my home?  Regularly fix the edges of walkways and driveways and fix any cracks.  Remove anything that might make you trip as you walk through a door, such as a raised step or threshold.  Trim any bushes or trees on the path to your home.  Use bright outdoor lighting.  Clear any walking paths of anything that might make someone trip, such as rocks or tools.  Regularly check to see if handrails are loose or broken. Make sure that both sides of any steps have handrails.  Any raised decks and porches should have guardrails on the edges.  Have any leaves, snow, or ice cleared regularly.  Use sand or salt on walking paths during winter.  Clean up any spills in your garage right away. This includes oil or grease spills. What can I do in the bathroom?  Use night lights.  Install grab bars by the toilet and in the tub and shower. Do not use towel bars as grab bars.  Use non-skid mats or decals in the tub or shower.  If you need to sit down in the shower, use a plastic, non-slip stool.  Keep the floor dry. Clean up any water that spills on the floor as soon as it happens.  Remove  soap buildup in the tub or shower regularly.  Attach bath mats securely with double-sided non-slip rug tape.  Do not have throw rugs and other things on the floor that can make you trip. What can I do in the bedroom?  Use night lights.  Make sure that you have a light by your bed that is easy to reach.  Do not use any sheets or blankets that are too big for your bed. They should not hang down onto the floor.  Have a firm chair that has side arms. You can use this for support while you get dressed.  Do not have throw rugs and other things on the floor that can make you trip. What can I do in the kitchen?  Clean up any spills right away.  Avoid walking on wet floors.  Keep items that you use a lot in easy-to-reach places.  If you need to reach something above you, use a strong step stool that has a grab bar.  Keep electrical cords out of the way.  Do not use floor polish or wax that makes floors slippery. If you must use wax, use non-skid floor wax.  Do not have throw rugs and other things on the floor that  can make you trip. What can I do with my stairs?  Do not leave any items on the stairs.  Make sure that there are handrails on both sides of the stairs and use them. Fix handrails that are broken or loose. Make sure that handrails are as long as the stairways.  Check any carpeting to make sure that it is firmly attached to the stairs. Fix any carpet that is loose or worn.  Avoid having throw rugs at the top or bottom of the stairs. If you do have throw rugs, attach them to the floor with carpet tape.  Make sure that you have a light switch at the top of the stairs and the bottom of the stairs. If you do not have them, ask someone to add them for you. What else can I do to help prevent falls?  Wear shoes that:  Do not have high heels.  Have rubber bottoms.  Are comfortable and fit you well.  Are closed at the toe. Do not wear sandals.  If you use a  stepladder:  Make sure that it is fully opened. Do not climb a closed stepladder.  Make sure that both sides of the stepladder are locked into place.  Ask someone to hold it for you, if possible.  Clearly mark and make sure that you can see:  Any grab bars or handrails.  First and last steps.  Where the edge of each step is.  Use tools that help you move around (mobility aids) if they are needed. These include:  Canes.  Walkers.  Scooters.  Crutches.  Turn on the lights when you go into a dark area. Replace any light bulbs as soon as they burn out.  Set up your furniture so you have a clear path. Avoid moving your furniture around.  If any of your floors are uneven, fix them.  If there are any pets around you, be aware of where they are.  Review your medicines with your doctor. Some medicines can make you feel dizzy. This can increase your chance of falling. Ask your doctor what other things that you can do to help prevent falls. This information is not intended to replace advice given to you by your health care provider. Make sure you discuss any questions you have with your health care provider. Document Released: 09/21/2009 Document Revised: 05/02/2016 Document Reviewed: 12/30/2014 Elsevier Interactive Patient Education  2017 [employer].

## 2020-04-28 DIAGNOSIS — H25013 Cortical age-related cataract, bilateral: Secondary | ICD-10-CM | POA: Diagnosis not present

## 2020-05-01 ENCOUNTER — Encounter: Payer: Self-pay | Admitting: Family Medicine

## 2020-05-01 ENCOUNTER — Other Ambulatory Visit: Payer: Self-pay

## 2020-05-01 ENCOUNTER — Ambulatory Visit (INDEPENDENT_AMBULATORY_CARE_PROVIDER_SITE_OTHER): Payer: Medicare Other | Admitting: Family Medicine

## 2020-05-01 VITALS — BP 136/88 | HR 71 | Temp 97.0°F | Ht 65.5 in | Wt 178.3 lb

## 2020-05-01 DIAGNOSIS — I10 Essential (primary) hypertension: Secondary | ICD-10-CM

## 2020-05-01 DIAGNOSIS — N289 Disorder of kidney and ureter, unspecified: Secondary | ICD-10-CM | POA: Diagnosis not present

## 2020-05-01 DIAGNOSIS — E781 Pure hyperglyceridemia: Secondary | ICD-10-CM

## 2020-05-01 DIAGNOSIS — Z0000 Encounter for general adult medical examination without abnormal findings: Secondary | ICD-10-CM | POA: Diagnosis not present

## 2020-05-01 DIAGNOSIS — D75839 Thrombocytosis, unspecified: Secondary | ICD-10-CM

## 2020-05-01 DIAGNOSIS — D473 Essential (hemorrhagic) thrombocythemia: Secondary | ICD-10-CM

## 2020-05-01 DIAGNOSIS — E039 Hypothyroidism, unspecified: Secondary | ICD-10-CM

## 2020-05-01 DIAGNOSIS — R7303 Prediabetes: Secondary | ICD-10-CM

## 2020-05-01 DIAGNOSIS — D649 Anemia, unspecified: Secondary | ICD-10-CM

## 2020-05-01 MED ORDER — MONTELUKAST SODIUM 10 MG PO TABS: 10.0000 mg | ORAL_TABLET | Freq: Every day | ORAL | 3 refills | Status: DC

## 2020-05-01 MED ORDER — LIOTHYRONINE SODIUM 25 MCG PO TABS: ORAL_TABLET | ORAL | 3 refills | Status: DC

## 2020-05-01 MED ORDER — MIRABEGRON ER 50 MG PO TB24: 50.0000 mg | ORAL_TABLET | Freq: Every day | ORAL | 3 refills | Status: DC

## 2020-05-01 MED ORDER — SYNTHROID 125 MCG PO TABS: 125.0000 ug | ORAL_TABLET | Freq: Every morning | ORAL | 3 refills | Status: DC

## 2020-05-01 MED ORDER — PROPRANOLOL HCL ER 120 MG PO CP24: 120.0000 mg | ORAL_CAPSULE | Freq: Every day | ORAL | 3 refills | Status: DC

## 2020-05-01 NOTE — Assessment & Plan Note (Signed)
Fair control with fenofibrate and diet  Disc goals for lipids and reasons to control them Rev last labs with pt Rev low sat fat diet in detail

## 2020-05-01 NOTE — Progress Notes (Signed)
Subjective:    Patient ID: [name], female    DOB: August 05, 1951, 69 y.o.   MRN: [id number]  This visit occurred during the SARS-CoV-2 public health emergency.  Safety protocols were in place, including screening questions prior to the visit, additional usage of staff PPE, and extensive cleaning of exam room while observing appropriate contact time as indicated for disinfecting solutions.    HPI Here for health maintenance exam and to review chronic medical problems    Wt Readings from Last 3 Encounters:  05/01/20 178 lb 5 oz (80.9 kg)  04/26/20 177 lb (80.3 kg)  04/22/19 160 lb (72.6 kg)   29.22 kg/m She is taking care of herself   Had amw on 5/19  No gaps   Is vaccinated for covid Also had shingrix vaccines   Mammogram 4/21 (had recall for ? R breast mass and that came back negative) Self breast exam -no lumps or changes   Colonoscopy 6/19  10 y recall   dexa 6/19 -bmd in the normal range  Falls-none Fractures -none Supplements  Takes ca and D  Exercise - walking and working in the yard    HTN bp is stable today  No cp or palpitations or headaches or edema  No side effects to medicines  BP Readings from Last 3 Encounters:  05/01/20 (!) 142/94  04/26/20 138/78  04/22/19 115/79     At home -ok  At dentist 138/78    Pulse Readings from Last 3 Encounters:  05/01/20 71  11/24/18 61  10/23/18 81   Hypothyroidism  (past h/o hyperthyroidism with thyroid storm)  Takes cytomel and levothyroxine daw  Lab Results  Component Value Date   TSH 12.40 (H) 04/26/2020   she has not noticed a lot of change  She had missed 2 doses directly before her lab    H/o renal insuff  Lab Results  Component Value Date   CREATININE 1.68 (H) 04/26/2020   BUN 35 (H) 04/26/2020   NA 135 04/26/2020   K 4.6 04/26/2020   CL 104 04/26/2020   CO2 26 04/26/2020  cr is up  GFR 30.1  Hydration - tries to drink 8 glasses per day (water)  occ soft drink  She has seen urology  for renal stones  None recent  No uti recent   H/o thrombocytosis  Lab Results  Component Value Date   WBC 7.4 04/26/2020   HGB 12.4 04/26/2020   HCT 37.0 04/26/2020   MCV 90.0 04/26/2020   PLT 487.0 (H) 04/26/2020   Last time platelets were 474  Past mild anemia Lab Results  Component Value Date   FERRITIN 167.5 04/26/2020  improved Hb   Prediabetes Lab Results  Component Value Date   HGBA1C 5.6 04/26/2020   This is stable from a year ago  Is mindful of sweets and eating in general  Husband is diabetic- they eat the same    Hyperlipidemia (triglyderides)  Lab Results  Component Value Date   CHOL 182 04/26/2020   CHOL 158 04/15/2019   CHOL 179 04/08/2018   Lab Results  Component Value Date   HDL 69.20 04/26/2020   HDL 73.50 04/15/2019   HDL 67.80 04/08/2018   Lab Results  Component Value Date   LDLCALC 56 04/15/2019   LDLCALC 78 04/04/2017   LDLCALC 88 03/20/2015   Lab Results  Component Value Date   TRIG 212.0 (H) 04/26/2020   TRIG 144.0 04/15/2019   TRIG 237.0 (H) 04/08/2018  Lab Results  Component Value Date   CHOLHDL 3 04/26/2020   CHOLHDL 2 04/15/2019   CHOLHDL 3 04/08/2018   Lab Results  Component Value Date   LDLDIRECT 77.0 04/26/2020   LDLDIRECT 90.0 04/08/2018   LDLDIRECT 112.0 03/29/2016     Taking fenofibrate  Well controlled   Patient Active Problem List   Diagnosis Date Noted  . Hyperkalemia 10/29/2018  . Hemorrhoids 10/23/2018  . Renal insufficiency 10/23/2018  . 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 Other dysphagia   . Elevated serum creatinine 04/19/2018  . Estrogen deficiency 04/15/2018  . Bladder prolapse, female, 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  . Dyspepsia 04/14/2017  . Colon cancer screening 04/09/2017  . Prediabetes 03/30/2017  . Medicare annual wellness visit, initial 04/04/2016  . Other screening mammogram 12/31/2011  . Post-menopausal 12/31/2011  . Routine general medical examination at a health care facility  12/23/2011  . 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  . Hypothyroidism 11/08/2008  . HSV (herpes simplex virus) infection 09/23/2007  . Hypertriglyceridemia 09/23/2007  . Essential hypertension 09/23/2007  . ALLERGIC RHINITIS 09/23/2007  . ASTHMA 09/23/2007  . HYPOKALEMIA 04/16/2007  . Thrombocytosis ([place]) 04/09/2007   Past Medical History:  Diagnosis Date  . Allergic rhinitis   . Allergy   . Anemia    b12 def.  . Asthma    controlled w/ singulair  . Herpes simplex without mention of complication    controlled w/ meds (no current fever blisters)  . History of kidney stones   . HLD (hyperlipidemia)    borderline-  no meds  . HTN (hypertension)    echo- 2006  . Hyperthyroidism hx ptu 2006   takes synthroid; had Graves disease, was treated for that- now thyroid doesn't work  . Leukocytosis, unspecified hx of 10 yrs ago   no problem since  . Migraine   . Mixed incontinence    urge and stress incontinence  . Neuromuscular disorder ([place])    hiatal hernia  . Renal cyst right    pt is unsure of this, has had kidney stones  . Ureteral calculi right    s/p laser  litho w/ stone extraction 01-28-11   Past Surgical History:  Procedure Laterality Date  . BIOPSY  10/16/2018   Procedure: BIOPSY;  Surgeon: Milus Banister, MD;  Location: [facility] ENDOSCOPY;  Service: Endoscopy;;  . [place]  . CERVICAL FUSION  2000   fusion c4-6  . CERVICAL FUSION  2005   fusion c6-7 and plate removal 624THL  . COLONOSCOPY    . CYSTOSCOPY W/ RETROGRADES  10/21/2011   Procedure: CYSTOSCOPY WITH RETROGRADE PYELOGRAM;  Surgeon: Ailene Rud, MD;  Location: [facility];  Service: Urology;  Laterality: Right;  CYSTOSCOPY RIGHT RETROGRADE, PYELOGRAM  URETEROSCOPY WITH HOLMIUM LASER AND STONE EXTRACTION  . CYSTOSCOPY/RETROGRADE/URETEROSCOPY/STONE EXTRACTION WITH BASKET  01-28-11   right   . ESOPHAGOGASTRODUODENOSCOPY (EGD) WITH PROPOFOL N/A 10/16/2018    Procedure: ESOPHAGOGASTRODUODENOSCOPY (EGD) WITH PROPOFOL;  Surgeon: Milus Banister, MD;  Location: [facility] ENDOSCOPY;  Service: Endoscopy;  Laterality: N/A;  . REDUCTION MAMMAPLASTY Bilateral 1995  . TONSILLECTOMY  1960  . VAGINAL HYSTERECTOMY  1987   fibroids/anemia   Social History   Tobacco Use  . Smoking status: Never Smoker  . Smokeless tobacco: Never Used  Substance Use Topics  . Alcohol use: No    Alcohol/week: 0.0 standard drinks  . Drug use: No   Family History  Problem Relation Age of Onset  .  Heart failure Father   . Osteoporosis Mother   . Diabetes Mother   . Diabetes Other        Grandfather  . Coronary artery disease Other        Grandmother  . Breast cancer Neg Hx   . Colon cancer Neg Hx   . Esophageal cancer Neg Hx   . Stomach cancer Neg Hx   . Rectal cancer Neg Hx    Allergies  Allergen Reactions  . Contrast Media [Iodinated Diagnostic Agents] Anaphylaxis  . Shellfish-Derived Products Anaphylaxis  . Septra [Sulfamethoxazole-Trimethoprim]     Nausea    Current Outpatient Medications on File Prior to Visit  Medication Sig Dispense Refill  . acyclovir (ZOVIRAX) 400 MG tablet Take 1 tablet (400 mg total) by mouth daily. 90 tablet 3  . calcium-vitamin D (OSCAL WITH D) 500-200 MG-UNIT per tablet Take 2 tablets by mouth daily.     . fenofibrate 54 MG tablet Take 1 tablet (54 mg total) by mouth daily. 90 tablet 3  . Multiple Vitamin (MULTIVITAMIN) tablet Take 1 tablet by mouth daily.     . SUMAtriptan (IMITREX) 100 MG tablet TAKE 1 TABLET FOR HEADACHE MAY REPEAT ONCE IN 2 HOURS IF NEEDED. MAXIMUM OF 2    TABLETS IN ONE DAY. (Patient taking differently: Take 100 mg by mouth as needed for migraine or headache. TAKE 100 mg TABLET FOR HEADACHE MAY REPEAT ONCE IN 2 HOURS IF NEEDED. MAXIMUM OF 200 mg    TABLETS IN ONE DAY.) 27 tablet 3  . vitamin B-12 (CYANOCOBALAMIN) 500 MCG tablet Take 500 mcg by mouth daily.       No current facility-administered medications on file  prior to visit.    Review of Systems  Constitutional: Negative for activity change, appetite change, fatigue, fever and unexpected weight change.  HENT: Negative for congestion, ear pain, rhinorrhea, sinus pressure and sore throat.   Eyes: Negative for pain, redness and visual disturbance.  Respiratory: Negative for cough, shortness of breath and wheezing.   Cardiovascular: Negative for chest pain and palpitations.  Gastrointestinal: Negative for abdominal pain, blood in stool, constipation and diarrhea.  Endocrine: Negative for polydipsia and polyuria.  Genitourinary: Negative for dysuria, frequency and urgency.  Musculoskeletal: Negative for arthralgias, back pain and myalgias.  Skin: Negative for pallor and rash.  Allergic/Immunologic: Negative for environmental allergies.  Neurological: Negative for dizziness, syncope and headaches.  Hematological: Negative for adenopathy. Does not bruise/bleed easily.  Psychiatric/Behavioral: Negative for decreased concentration and dysphoric mood. The patient is not nervous/anxious.        Objective:   Physical Exam Constitutional:      General: She is not in acute distress.    Appearance: Normal appearance. She is well-developed and normal weight. She is not ill-appearing or diaphoretic.  HENT:     Head: Normocephalic and atraumatic.     Right Ear: Tympanic membrane, ear canal and external ear normal.     Left Ear: Tympanic membrane, ear canal and external ear normal.     Nose: Nose normal. No congestion.     Mouth/Throat:     Mouth: Mucous membranes are moist.     Pharynx: Oropharynx is clear. No posterior oropharyngeal erythema.  Eyes:     General: No scleral icterus.    Extraocular Movements: Extraocular movements intact.     Conjunctiva/sclera: Conjunctivae normal.     Pupils: Pupils are equal, round, and reactive to light.  Neck:     Thyroid: No thyromegaly.  Vascular: No carotid bruit or JVD.  Cardiovascular:     Rate and  Rhythm: Normal rate and regular rhythm.     Pulses: Normal pulses.     Heart sounds: Normal heart sounds. No gallop.   Pulmonary:     Effort: Pulmonary effort is normal. No respiratory distress.     Breath sounds: Normal breath sounds. No wheezing.     Comments: Good air exch Chest:     Chest wall: No tenderness.  Abdominal:     General: Bowel sounds are normal. There is no distension or abdominal bruit.     Palpations: Abdomen is soft. There is no mass.     Tenderness: There is no abdominal tenderness.     Hernia: No hernia is present.  Genitourinary:    Comments: Breast exam: No mass, nodules, thickening, tenderness, bulging, retraction, inflamation, nipple discharge or skin changes noted.  No axillary or clavicular LA.     Musculoskeletal:        General: No tenderness. Normal range of motion.     Cervical back: Normal range of motion and neck supple. No rigidity. No muscular tenderness.     Right lower leg: No edema.     Left lower leg: No edema.  Lymphadenopathy:     Cervical: No cervical adenopathy.  Skin:    General: Skin is warm and dry.     Coloration: Skin is not pale.     Findings: No erythema or rash.     Comments: Fair  Some lentigines and angiomas   Neurological:     Mental Status: She is alert. Mental status is at baseline.     Cranial Nerves: No cranial nerve deficit.     Motor: No tremor or abnormal muscle tone.     Coordination: Coordination normal.     Gait: Gait normal.     Deep Tendon Reflexes: Reflexes are normal and symmetric. Reflexes normal.  Psychiatric:        Mood and Affect: Mood normal.        Cognition and Memory: Cognition and memory normal.           Assessment & Plan:   Problem List Items Addressed This Visit      Cardiovascular and Mediastinum   Essential hypertension    bp in fair control at this time  BP Readings from Last 1 Encounters:  05/01/20 136/88   No changes needed Most recent labs reviewed  Disc lifstyle change  with low sodium diet and exercise        Relevant Medications   propranolol ER (INDERAL LA) 120 MG 24 hr capsule     Endocrine   Hypothyroidism    Lab Results  Component Value Date   TSH 12.40 (H) 04/26/2020   Pt missed 2 doses in a row directly before this draw (her T3 and her T4) Will re check this in 1 mo and then decide if dosing changes are needed      Relevant Medications   liothyronine (CYTOMEL) 25 MCG tablet   propranolol ER (INDERAL LA) 120 MG 24 hr capsule   SYNTHROID 125 MCG tablet     Genitourinary   Renal insufficiency    Cr up to 1.68  May not have enough fluids when working outdoors No recent renal stones Re check this in 1 mo        Hematopoietic and Hemostatic   Thrombocytosis ([place])    Fairly stable with pl ct of 487 No bruising/bleeding or  clots Continue to follow        Other   Hypertriglyceridemia    Fair control with fenofibrate and diet  Disc goals for lipids and reasons to control them Rev last labs with pt Rev low sat fat diet in detail       Relevant Medications   propranolol ER (INDERAL LA) 120 MG 24 hr capsule   Routine general medical examination at a health care facility - Primary    Reviewed health habits including diet and exercise and skin cancer prevention Reviewed appropriate screening tests for age  Also reviewed health mt list, fam hx and immunization status , as well as social and family history   See HPI Labs rev  amw rev  Is vaccinated for covid 19 and shingles  utd colon and breast screen and dexa  Good self care Continue to follow chronic conditions       Prediabetes    Lab Results  Component Value Date   HGBA1C 5.6 04/26/2020   Stable disc imp of low glycemic diet and wt loss to prevent DM2       RESOLVED: Mild anemia

## 2020-05-01 NOTE — Assessment & Plan Note (Signed)
Cr up to 1.68  May not have enough fluids when working outdoors No recent renal stones Re check this in 1 mo

## 2020-05-01 NOTE — Patient Instructions (Addendum)
Your TSH was up- could be from missed doses Let's re check in a month  Also will re check kidney numbers   Goal for fluids is 64 oz daily -mostly water  Kidney number went up   Take care of yourself  Stay active and eat a healthy idet

## 2020-05-01 NOTE — Assessment & Plan Note (Signed)
Lab Results  Component Value Date   TSH 12.40 (H) 04/26/2020   Pt missed 2 doses in a row directly before this draw (her T3 and her T4) Will re check this in 1 mo and then decide if dosing changes are needed

## 2020-05-01 NOTE — Assessment & Plan Note (Signed)
Reviewed health habits including diet and exercise and skin cancer prevention Reviewed appropriate screening tests for age  Also reviewed health mt list, fam hx and immunization status , as well as social and family history   See HPI Labs rev  amw rev  Is vaccinated for covid 19 and shingles  utd colon and breast screen and dexa  Good self care Continue to follow chronic conditions

## 2020-05-01 NOTE — Assessment & Plan Note (Signed)
bp in fair control at this time  BP Readings from Last 1 Encounters:  05/01/20 136/88   No changes needed Most recent labs reviewed  Disc lifstyle change with low sodium diet and exercise

## 2020-05-01 NOTE — Assessment & Plan Note (Signed)
Fairly stable with pl ct of 487 No bruising/bleeding or clots Continue to follow

## 2020-05-01 NOTE — Assessment & Plan Note (Signed)
Lab Results  Component Value Date   HGBA1C 5.6 04/26/2020   Stable disc imp of low glycemic diet and wt loss to prevent DM2

## 2020-05-31 ENCOUNTER — Telehealth: Payer: Self-pay | Admitting: Family Medicine

## 2020-05-31 DIAGNOSIS — E039 Hypothyroidism, unspecified: Secondary | ICD-10-CM

## 2020-05-31 DIAGNOSIS — D75839 Thrombocytosis, unspecified: Secondary | ICD-10-CM

## 2020-05-31 DIAGNOSIS — R7303 Prediabetes: Secondary | ICD-10-CM

## 2020-05-31 DIAGNOSIS — I10 Essential (primary) hypertension: Secondary | ICD-10-CM

## 2020-05-31 NOTE — Telephone Encounter (Signed)
-----   Message from Ellamae Sia sent at 05/17/2020  3:00 PM EDT ----- Regarding: Lab orders for Thursday, 6.24.21 Patient is scheduled for CPX labs, please order future labs, Thanks , Karna Christmas

## 2020-06-01 ENCOUNTER — Other Ambulatory Visit (INDEPENDENT_AMBULATORY_CARE_PROVIDER_SITE_OTHER): Payer: Medicare Other

## 2020-06-01 DIAGNOSIS — I10 Essential (primary) hypertension: Secondary | ICD-10-CM

## 2020-06-01 DIAGNOSIS — R7303 Prediabetes: Secondary | ICD-10-CM | POA: Diagnosis not present

## 2020-06-01 DIAGNOSIS — E039 Hypothyroidism, unspecified: Secondary | ICD-10-CM

## 2020-06-01 DIAGNOSIS — D473 Essential (hemorrhagic) thrombocythemia: Secondary | ICD-10-CM | POA: Diagnosis not present

## 2020-06-01 DIAGNOSIS — D75839 Thrombocytosis, unspecified: Secondary | ICD-10-CM

## 2020-06-01 LAB — COMPREHENSIVE METABOLIC PANEL
ALT: 14 U/L (ref 0–35)
AST: 17 U/L (ref 0–37)
Albumin: 4.1 g/dL (ref 3.5–5.2)
Alkaline Phosphatase: 79 U/L (ref 39–117)
BUN: 31 mg/dL — ABNORMAL HIGH (ref 6–23)
CO2: 24 mEq/L (ref 19–32)
Calcium: 9.5 mg/dL (ref 8.4–10.5)
Chloride: 109 mEq/L (ref 96–112)
Creatinine, Ser: 1.41 mg/dL — ABNORMAL HIGH (ref 0.40–1.20)
GFR: 36.92 mL/min — ABNORMAL LOW (ref >60.00)
Glucose, Bld: 104 mg/dL — ABNORMAL HIGH (ref 70–99)
Potassium: 4.8 mEq/L (ref 3.5–5.1)
Sodium: 135 mEq/L (ref 135–145)
Total Bilirubin: 0.4 mg/dL (ref 0.2–1.2)
Total Protein: 6.7 g/dL (ref 6.0–8.3)

## 2020-06-01 LAB — CBC WITH DIFFERENTIAL/PLATELET
Basophils Absolute: 0.1 10*3/uL (ref 0.0–0.1)
Basophils Relative: 0.7 % (ref 0.0–3.0)
Eosinophils Absolute: 0.2 10*3/uL (ref 0.0–0.7)
Eosinophils Relative: 2.4 % (ref 0.0–5.0)
HCT: 35.6 % — ABNORMAL LOW (ref 36.0–46.0)
Hemoglobin: 12.1 g/dL (ref 12.0–15.0)
Lymphocytes Relative: 28.9 % (ref 12.0–46.0)
Lymphs Abs: 2.4 10*3/uL (ref 0.7–4.0)
MCHC: 33.9 g/dL (ref 30.0–36.0)
MCV: 89.4 fl (ref 78.0–100.0)
Monocytes Absolute: 0.6 10*3/uL (ref 0.1–1.0)
Monocytes Relative: 6.7 % (ref 3.0–12.0)
Neutro Abs: 5 10*3/uL (ref 1.4–7.7)
Neutrophils Relative %: 61.3 % (ref 43.0–77.0)
Platelets: 528 10*3/uL — ABNORMAL HIGH (ref 150.0–400.0)
RBC: 3.98 Mil/uL (ref 3.87–5.11)
RDW: 13 % (ref 11.5–15.5)
WBC: 8.2 10*3/uL (ref 4.0–10.5)

## 2020-06-01 LAB — TSH: TSH: 0.16 u[IU]/mL — ABNORMAL LOW (ref 0.35–4.50)

## 2020-06-01 LAB — LIPID PANEL
Cholesterol: 160 mg/dL (ref 0–200)
HDL: 58.9 mg/dL (ref >39.00)
LDL Cholesterol: 68 mg/dL (ref 0–99)
NonHDL: 100.64
Total CHOL/HDL Ratio: 3
Triglycerides: 163 mg/dL — ABNORMAL HIGH (ref 0.0–149.0)
VLDL: 32.6 mg/dL (ref 0.0–40.0)

## 2020-06-01 LAB — HEMOGLOBIN A1C: Hgb A1c MFr Bld: 5.8 % (ref 4.6–6.5)

## 2020-06-02 ENCOUNTER — Telehealth: Payer: Self-pay | Admitting: *Deleted

## 2020-06-02 MED ORDER — SYNTHROID 100 MCG PO TABS
100.0000 ug | ORAL_TABLET | Freq: Every day | ORAL | 1 refills | Status: DC
Start: 2020-06-02 — End: 2020-06-27

## 2020-06-02 NOTE — Telephone Encounter (Signed)
-----   Message from Abner Greenspan, MD sent at 06/01/2020  7:04 PM EDT ----- TSH is actually too low this time (last time she missed doses)  She takes levothyroxine 125 and I think we should go down to 100 mcg  Please call in 1 po qd #30 3 ref  Re check TSH in 4-6 wk Kidney numbers were a little better-please enc her to keep up a good fluid intake

## 2020-06-02 NOTE — Telephone Encounter (Signed)
Pt notified of lab results and Dr. Marliss Coots comments. Rx sent to pharmacy and f/u lab appt scheduled

## 2020-06-24 ENCOUNTER — Other Ambulatory Visit: Payer: Self-pay | Admitting: Family Medicine

## 2020-07-11 ENCOUNTER — Telehealth: Payer: Self-pay | Admitting: Family Medicine

## 2020-07-11 DIAGNOSIS — E039 Hypothyroidism, unspecified: Secondary | ICD-10-CM

## 2020-07-11 NOTE — Telephone Encounter (Signed)
-----   Message from Ellamae Sia sent at 06/28/2020  9:26 AM EDT ----- Regarding: Lab orders for Wednesday,8.4.21 Lab orders for Thyroid??

## 2020-07-12 ENCOUNTER — Other Ambulatory Visit: Payer: Self-pay

## 2020-07-12 ENCOUNTER — Other Ambulatory Visit (INDEPENDENT_AMBULATORY_CARE_PROVIDER_SITE_OTHER): Payer: Medicare Other

## 2020-07-12 DIAGNOSIS — E039 Hypothyroidism, unspecified: Secondary | ICD-10-CM | POA: Diagnosis not present

## 2020-07-12 LAB — TSH: TSH: 0.03 u[IU]/mL — ABNORMAL LOW (ref 0.35–4.50)

## 2020-07-14 ENCOUNTER — Encounter: Payer: Self-pay | Admitting: Family Medicine

## 2020-07-17 MED ORDER — LEVOTHYROXINE SODIUM 75 MCG PO TABS
75.0000 ug | ORAL_TABLET | Freq: Every day | ORAL | 2 refills | Status: DC
Start: 2020-07-17 — End: 2020-09-28

## 2020-07-19 ENCOUNTER — Other Ambulatory Visit: Payer: Self-pay | Admitting: Family Medicine

## 2020-08-09 ENCOUNTER — Telehealth: Payer: Self-pay | Admitting: Family Medicine

## 2020-08-09 DIAGNOSIS — E039 Hypothyroidism, unspecified: Secondary | ICD-10-CM

## 2020-08-09 NOTE — Telephone Encounter (Signed)
-----   Message from Cloyd Stagers, RT sent at 07/24/2020 11:29 AM EDT ----- Regarding: Lab Orders for Thursday 9.2.2021 Please place lab orders for Thursday 9.2.2021, appt notes state " TSH" Thank you, Dyke Maes RT(R)

## 2020-08-10 ENCOUNTER — Other Ambulatory Visit (INDEPENDENT_AMBULATORY_CARE_PROVIDER_SITE_OTHER): Payer: Medicare Other

## 2020-08-10 ENCOUNTER — Other Ambulatory Visit: Payer: Self-pay

## 2020-08-10 DIAGNOSIS — E039 Hypothyroidism, unspecified: Secondary | ICD-10-CM | POA: Diagnosis not present

## 2020-08-10 LAB — TSH: TSH: 0.38 u[IU]/mL (ref 0.35–4.50)

## 2020-09-27 ENCOUNTER — Encounter: Payer: Self-pay | Admitting: Family Medicine

## 2020-09-28 ENCOUNTER — Other Ambulatory Visit: Payer: Self-pay | Admitting: Family Medicine

## 2020-10-17 ENCOUNTER — Other Ambulatory Visit: Payer: Self-pay | Admitting: Family Medicine

## 2020-10-17 NOTE — Telephone Encounter (Signed)
CPE was on 05/01/20, last filled on 04/22/19 #90 tabs with 3 refills, please advise

## 2020-10-19 ENCOUNTER — Other Ambulatory Visit: Payer: Self-pay

## 2020-10-19 ENCOUNTER — Ambulatory Visit: Payer: Medicare Other

## 2020-10-19 DIAGNOSIS — I10 Essential (primary) hypertension: Secondary | ICD-10-CM

## 2020-10-19 DIAGNOSIS — N289 Disorder of kidney and ureter, unspecified: Secondary | ICD-10-CM

## 2020-10-19 NOTE — Chronic Care Management (AMB) (Signed)
Chronic Care Management Pharmacy  Name: SHEWANDA SHARPE  MRN: [id number] DOB: [birth year]  Chief Complaint/ HPI  [name],  69 y.o., female presents for their Follow-Up CCM visit with the clinical pharmacist via telephone.  PCP : Abner Greenspan, MD  Their chronic conditions include: hypertension, hypertriglyceridemia, asthma, allergic rhinitis, hypothyroidism, ulcer of esophagus, renal insufficiency, bladder prolapse, mixed incontinence, pre-diabetes, HSV infection  Patient concerns: avoiding COVID-19  Office Visits:  07/14/20: Patient call - TSH low, Dr. Glori Bickers decreased levothyroxine from 100 to 75 mcg daily, repeat lab in 4-6 weeks   05/01/20: [facility] - Cr up 1.68, recheck 1 month, TSH up, missed 2 doses prior to lab, TG elevated, fair control, discussed low saturated fat diet, A1c stable 5.6, HTN fair control on propranolol  04/22/19: [facility] - mammogram June 2020, avoid NSAIDs, stay hydrated, exercise daily, cont current meds  Consult Visit: none in past 6 months  Allergies  Allergen Reactions   Contrast Media [Iodinated Diagnostic Agents] Anaphylaxis   Shellfish-Derived Products Anaphylaxis   Septra [Sulfamethoxazole-Trimethoprim]     Nausea    Medications: Outpatient Encounter Medications as of 10/19/2020  Medication Sig   acyclovir (ZOVIRAX) 400 MG tablet TAKE 1 TABLET BY MOUTH  DAILY   calcium-vitamin D (OSCAL WITH D) 500-200 MG-UNIT per tablet Take 2 tablets by mouth daily.    fenofibrate 54 MG tablet Take 1 tablet (54 mg total) by mouth daily.   liothyronine (CYTOMEL) 25 MCG tablet TAKE ONE-HALF TABLET BY  MOUTH EVERY EVENING   mirabegron ER (MYRBETRIQ) 50 MG TB24 tablet Take 1 tablet (50 mg total) by mouth daily.   montelukast (SINGULAIR) 10 MG tablet Take 1 tablet (10 mg total) by mouth daily.   Multiple Vitamin (MULTIVITAMIN) tablet Take 1 tablet by mouth daily.    propranolol ER (INDERAL LA) 120 MG 24 hr capsule Take 1 capsule (120 mg total) by  mouth daily.   SUMAtriptan (IMITREX) 100 MG tablet TAKE 1 TABLET FOR HEADACHE MAY REPEAT ONCE IN 2 HOURS IF NEEDED. MAXIMUM OF 2    TABLETS IN ONE DAY. (Patient taking differently: Take 100 mg by mouth as needed for migraine or headache. TAKE 100 mg TABLET FOR HEADACHE MAY REPEAT ONCE IN 2 HOURS IF NEEDED. MAXIMUM OF 200 mg    TABLETS IN ONE DAY.)   SYNTHROID 75 MCG tablet TAKE 1 TABLET BY MOUTH EVERY DAY   vitamin B-12 (CYANOCOBALAMIN) 500 MCG tablet Take 500 mcg by mouth daily.     No facility-administered encounter medications on file as of 10/19/2020.   Current Diagnosis/Assessment:   [employer]: Low Risk    Difficulty of Paying Living Expenses: Not hard at all   Goals Addressed            This Visit's Progress    Pharmacy Care Plan       CARE PLAN ENTRY  Current Barriers:   Chronic Disease Management support, education, and care coordination needs related to Hypertension and Chronic Kidney Disease   Hypertension  Pharmacist Clinical Goal(s): o Over the next 6 months, patient will work with PharmD and providers to maintain BP goal <130/80 mmHg  Current regimen:  o Propranolol ER 120 mg - 1 capsule daily  Interventions: o Recommend more frequent home monitoring to ensure BP < 130/80 to prevent decline in kidney function  Patient self care activities - Over the next 6 months, patient will: o Check BP at least once monthly, document, and provide at future appointments  o Ensure daily salt intake < 2000 mg/day to reduce BP   CKD  Pharmacist Clinical Goal(s): o Over the next 6 months, patient will work with PharmD and providers to slow decline of kidney function  Current regimen:  o No pharmacotherapy  Interventions: o Recommend more frequent home monitoring to ensure BP < 130/80 to prevent decline in kidney function o Recommend discussing Farxiga with Dr. Glori Bickers at next follow up visit  Patient self care activities - Over the next 6 months,  patient will: o Check BP at least once monthly, document, and provide at future appointments o Ensure daily salt intake < 2000 mg/day  o Increase exercise with goal of 30 minutes, 5 days per week o Incorporate a healthy diet high in vegetables, fruits and whole grains with low-fat dairy products, chicken, fish, legumes, non-tropical vegetable oils and nuts. Limit intake of sweets, sugar-sweetened beverages and red meats.  Please see past updates related to this goal by clicking on the "Past Updates" button in the selected goal       Hypertension/CKD   CMP Latest Ref Rng & Units 06/01/2020 04/26/2020 04/15/2019  Glucose 70 - 99 mg/dL 104(H) 88 90  BUN 6 - 23 mg/dL 31(H) 35(H) 36(H)  Creatinine 0.40 - 1.20 mg/dL 1.41(H) 1.68(H) 1.49(H)  Sodium 135 - 145 mEq/L 135 135 139  Potassium 3.5 - 5.1 mEq/L 4.8 4.6 4.2  Chloride 96 - 112 mEq/L 109 104 105  CO2 19 - 32 mEq/L 24 26 24   Calcium 8.4 - 10.5 mg/dL 9.5 9.5 9.2  Total Protein 6.0 - 8.3 g/dL 6.7 7.4 7.3  Total Bilirubin 0.2 - 1.2 mg/dL 0.4 0.5 0.6  Alkaline Phos 39 - 117 U/L 79 70 68  AST 0 - 37 U/L 17 17 25   ALT 0 - 35 U/L 14 10 22    Kidney Function Lab Results  Component Value Date/Time   CREATININE 1.41 (H) 06/01/2020 08:27 AM   CREATININE 1.68 (H) 04/26/2020 08:03 AM   CREATININE 1.89 (H) 10/23/2018 04:26 PM   GFR 36.92 (L) 06/01/2020 08:27 AM   GFRNONAA 33 (L) 10/17/2018 06:39 AM   GFRAA 38 (L) 10/17/2018 06:39 AM   Office blood pressures are: BP Readings from Last 3 Encounters:  05/01/20 136/88  04/26/20 138/78  04/22/19 115/79   Patient has failed these meds in the past: irbesartan/hctz - held due to renal function/hypokalemia 2019 during hospitalization  Patient checks BP at home: once a month, arm monitor  Patient home BP readings are ranging: 120s/70s  BP goal < 130/80 mmHg Patient is currently controlled on the following medications:   Propranolol ER 120 mg - 1 capsule daily  Note: pt has migraines, although not  1st line for HTN, propranolol may reduce migraine frequency, however, non-selective BB may worsen asthma -- may consider restarting ARB due to renal impairment/pre-DM  Update 10/19/20: Patient reports checking BP every couple of months. Reports BP remains a little lower at home than in clinic (120s/70s), reports white coat syndrome. 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 Discussed importance of slowing progression with healthy diet/exercise. Pt reports she has seen a specialist for bladder before. Per chart, no history of nephrology consult.  Recommend limiting sodium intake < 2 gm per day  (KDIGO). Recommend starting Farxiga 10 mg for CKD (GFR > 25 ml/min).  Plan: Continue current medications  Hypertriglyceridemia   Lipid Panel     Component Value Date/Time   CHOL 160 06/01/2020 0827   TRIG 163.0 (H) 06/01/2020 0827  HDL 58.90 06/01/2020 0827   CHOLHDL 3 06/01/2020 0827   VLDL 32.6 06/01/2020 0827   LDLCALC 68 06/01/2020 0827   LDLDIRECT 77.0 04/26/2020 0803    The 10-year ASCVD risk score Mikey Bussing DC Jr., et al., 2013) is: 11.6%   Values used to calculate the score:     Age: 73 years     Sex: Female     Is Non-Hispanic African American: No     Diabetic: No     Tobacco smoker: No     Systolic Blood Pressure: 010 mmHg     Is BP treated: Yes     HDL Cholesterol: 58.9 mg/dL     Total Cholesterol: 160 mg/dL   LDL goal < 100, TG < 150, HDL > 50 Patient has failed these meds in past: none  Patient is currently controlled on the following medications:   Fenofibrate 54 mg - 1 tablet daily  We discussed: denies adverse effects, confirms adherence Per renal function CrCl 30-80 ml/min, lowest dose of fenofibrate is recommended - fenofibrate 54 mg daily.  Update 10/19/20: Fenofibrate dose reduced 04/18/20 due to kidney impairment per CCM consult. TG have improved from 04/26/20 to 06/01/20 but remain slightly elevated > 150. Discussed patient goals. Encouraged SMART goal setting. Pt would like to lose  weight/exercise, but limiting time outdoors due to headaches/allergeis and not going out much due to [place]. Does not have a gym membership, not interested. Declines setting any specific goals today.  Plan: Continue current medications; Recommend weight loss for CV health.   Hypothyroidism/Grave's Disease   TSH  Date Value Ref Range Status  08/10/2020 0.38 0.35 - 4.50 uIU/mL Final    Patient has failed these meds in past: none reported Patient is currently controlled on the following medications:   Synthroid 75 mcg - 1 tablet daily  Liothyronine 25 mcg - 1/2 tablet every evening   Update 10/19/20: Synthroid dose lowered to 75 mcg 07/14/20. Pt reports she is feeling better with lower dose (better memory, less fatigue), f/u with Dr. Glori Bickers in January.   Plan: Continue current medications  Mixed Incontinence   Patient has failed these meds in past: none reported Patient is currently controlled on the following medications:   Myrbetriq 50 mg - 1 tablet daily  We discussed: helpful, no concerns; GFR > 30 ml/min, dose appropriate  No update since CCM visit 04/18/20  Plan: Continue current medications   Pre- Diabetes   Recent Relevant Labs: Lab Results  Component Value Date/Time   HGBA1C 5.8 06/01/2020 08:27 AM   HGBA1C 5.6 04/26/2020 08:03 AM    Checking BG: none Patient has failed these meds in past: none Patient is currently controlled on the following medications:   No pharmacotherapy  Exercise: daily time spent gardening  Update 10/19/20: reviewed dietary and exercise goals  Plan: Continue control with diet and exercise  Allergies/Asthma   Patient has failed these meds in past: none  Patient is currently controlled on the following medications:   Montelukast 10 mg - 1 tablet daily  We discussed: seasonal allergies, takes PRN oral antihistamine if symptoms worsen, denies inhaler use  No update since CCM visit 04/18/20  Plan: Continue current medications    Migraines   Migraine frequency: once a month Symptoms/duration: headache, nausea - usually last about 1 day Patient has failed these meds in past:  Patient is currently controlled on the following medications:   Sumatriptan 100 mg - 1 tablet PRN   We discussed: usually one tablet  is effective; avoids NSAIDs due to renal insufficiency  Update 10/19/20: migraines still about once a month, no worsening   Plan: Continue current medications  Medication Management  Misc: Acyclovir 400 mg - 1 daily for cold sores   OTCs: calcium-vitamin D 500mg -200 IU - 2 tabs daily, multivitamin (Centrum), vitamin B12 500 mcg - daily, vitamin C, Tylenol 500 mg PRN --> denies changes 10/19/20  Pharmacy/Benefits: [facility]  Adherence: pillbox, denies missed doses  Social support: family support  Affordability: denies concerns  CCM Follow Up:  6 months (telephone)  Debbora Dus, PharmD Clinical Pharmacist [facility] [phone]

## 2020-10-19 NOTE — Progress Notes (Signed)
I have collaborated with the care management provider regarding care management and care coordination activities outlined in this encounter and have reviewed this encounter including documentation in the note and care plan. I am certifying that I agree with the content of this note and encounter as supervising physician. Loura Pardon MD

## 2020-10-19 NOTE — Patient Instructions (Addendum)
Dear [name],  Below is a summary of the goals we discussed during our follow up appointment on October 19, 2020. Please contact me anytime with questions or concerns.   Visit Information  Goals Addressed            This Visit's Progress   . Pharmacy Care Plan       CARE PLAN ENTRY  Current Barriers:  . Chronic Disease Management support, education, and care coordination needs related to Hypertension and Chronic Kidney Disease   Hypertension . Pharmacist Clinical Goal(s): o Over the next 6 months, patient will work with PharmD and providers to maintain BP goal <130/80 mmHg . Current regimen:  o Propranolol ER 120 mg - 1 capsule daily . Interventions: o Recommend more frequent home monitoring to ensure BP < 130/80 to prevent decline in kidney function . Patient self care activities - Over the next 6 months, patient will: o Check BP at least once monthly, document, and provide at future appointments o Ensure daily salt intake < 2000 mg/day to reduce BP   CKD . Pharmacist Clinical Goal(s): o Over the next 6 months, patient will work with PharmD and providers to slow decline of kidney function . Current regimen:  o No pharmacotherapy . Interventions: o Recommend more frequent home monitoring to ensure BP < 130/80 to prevent decline in kidney function o Recommend discussing Farxiga with Dr. Glori Bickers at next follow up visit . Patient self care activities - Over the next 6 months, patient will: o Check BP at least once monthly, document, and provide at future appointments o Ensure daily salt intake < 2000 mg/day  o Increase exercise with goal of 30 minutes, 5 days per week o Incorporate a healthy diet high in vegetables, fruits and whole grains with low-fat dairy products, chicken, fish, legumes, non-tropical vegetable oils and nuts. Limit intake of sweets, sugar-sweetened beverages and red meats.  Please see past updates related to this goal by clicking on the "Past  Updates" button in the selected goal       The pharmacy team will reach out to the patient again over the next 90 days.  to evaluate monthly blood pressure checks.   Debbora Dus, PharmD Clinical Pharmacist [facility] at [facility] [phone]   [place] stands for "Dietary Approaches to Stop Hypertension." The DASH eating plan is a healthy eating plan that has been shown to reduce high blood pressure (hypertension). It may also reduce your risk for type 2 diabetes, heart disease, and stroke. The DASH eating plan may also help with weight loss. What are tips for following this plan?  General guidelines  Avoid eating more than 2,300 mg (milligrams) of salt (sodium) a day. If you have hypertension, you may need to reduce your sodium intake to 1,500 mg a day.  Limit alcohol intake to no more than 1 drink a day for nonpregnant women and 2 drinks a day for men. One drink equals 12 oz of beer, 5 oz of wine, or 1 oz of hard liquor.  Work with your health care provider to maintain a healthy body weight or to lose weight. Ask what an ideal weight is for you.  Get at least 30 minutes of exercise that causes your heart to beat faster (aerobic exercise) most days of the week. Activities may include walking, swimming, or biking.  Work with your health care provider or diet and nutrition specialist (dietitian) to adjust your eating plan to your individual  calorie needs. Reading food labels   Check food labels for the amount of sodium per serving. Choose foods with less than 5 percent of the Daily Value of sodium. Generally, foods with less than 300 mg of sodium per serving fit into this eating plan.  To find whole grains, look for the word "whole" as the first word in the ingredient list. Shopping  Buy products labeled as "low-sodium" or "no salt added."  Buy fresh foods. Avoid canned foods and premade or frozen meals. Cooking  Avoid adding salt when cooking.  Use salt-free seasonings or herbs instead of table salt or sea salt. Check with your health care provider or pharmacist before using salt substitutes.  Do not fry foods. Cook foods using healthy methods such as baking, boiling, grilling, and broiling instead.  Cook with heart-healthy oils, such as olive, canola, soybean, or sunflower oil. Meal planning  Eat a balanced diet that includes: ? 5 or more servings of fruits and vegetables each day. At each meal, try to fill half of your plate with fruits and vegetables. ? Up to 6-8 servings of whole grains each day. ? Less than 6 oz of lean meat, poultry, or fish each day. A 3-oz serving of meat is about the same size as a deck of cards. One egg equals 1 oz. ? 2 servings of low-fat dairy each day. ? A serving of nuts, seeds, or beans 5 times each week. ? Heart-healthy fats. Healthy fats called Omega-3 fatty acids are found in foods such as flaxseeds and coldwater fish, like sardines, salmon, and mackerel.  Limit how much you eat of the following: ? Canned or prepackaged foods. ? Food that is high in trans fat, such as fried foods. ? Food that is high in saturated fat, such as fatty meat. ? Sweets, desserts, sugary drinks, and other foods with added sugar. ? Full-fat dairy products.  Do not salt foods before eating.  Try to eat at least 2 vegetarian meals each week.  Eat more home-cooked food and less restaurant, buffet, and fast food.  When eating at a restaurant, ask that your food be prepared with less salt or no salt, if possible. What foods are recommended? The items listed may not be a complete list. Talk with your dietitian about what dietary choices are best for you. Grains Whole-grain or whole-wheat bread. Whole-grain or whole-wheat pasta. Brown rice. [place]. Bulgur. Whole-grain and low-sodium cereals. Pita bread. Low-fat, low-sodium crackers. Whole-wheat flour tortillas. Vegetables Fresh or frozen vegetables (raw,  steamed, roasted, or grilled). Low-sodium or reduced-sodium tomato and vegetable juice. Low-sodium or reduced-sodium tomato sauce and tomato paste. Low-sodium or reduced-sodium canned vegetables. Fruits All fresh, dried, or frozen fruit. Canned fruit in natural juice (without added sugar). Meat and other protein foods Skinless chicken or [place]. Ground chicken or [place]. Pork with fat trimmed off. Fish and seafood. Egg whites. Dried beans, peas, or lentils. Unsalted nuts, nut butters, and seeds. Unsalted canned beans. Lean cuts of beef with fat trimmed off. Low-sodium, lean deli meat. Dairy Low-fat (1%) or fat-free (skim) milk. Fat-free, low-fat, or reduced-fat cheeses. Nonfat, low-sodium ricotta or cottage cheese. Low-fat or nonfat yogurt. Low-fat, low-sodium cheese. Fats and oils Soft margarine without trans fats. Vegetable oil. Low-fat, reduced-fat, or light mayonnaise and salad dressings (reduced-sodium). Canola, safflower, olive, soybean, and sunflower oils. Avocado. Seasoning and other foods Herbs. Spices. Seasoning mixes without salt. Unsalted popcorn and pretzels. Fat-free sweets. What foods are not recommended? The items listed may not be a  complete list. Talk with your dietitian about what dietary choices are best for you. Grains Baked goods made with fat, such as croissants, muffins, or some breads. Dry pasta or rice meal packs. Vegetables Creamed or fried vegetables. Vegetables in a cheese sauce. Regular canned vegetables (not low-sodium or reduced-sodium). Regular canned tomato sauce and paste (not low-sodium or reduced-sodium). Regular tomato and vegetable juice (not low-sodium or reduced-sodium). Angie Fava. Olives. Fruits Canned fruit in a light or heavy syrup. Fried fruit. Fruit in cream or butter sauce. Meat and other protein foods Fatty cuts of meat. Ribs. Fried meat. Berniece Salines. Sausage. Bologna and other processed lunch meats. Salami. Fatback. Hotdogs. Bratwurst. Salted nuts and  seeds. Canned beans with added salt. Canned or smoked fish. Whole eggs or egg yolks. Chicken or [place] with skin. Dairy Whole or 2% milk, cream, and half-and-half. Whole or full-fat cream cheese. Whole-fat or sweetened yogurt. Full-fat cheese. Nondairy creamers. Whipped toppings. Processed cheese and cheese spreads. Fats and oils Butter. Stick margarine. Lard. Shortening. Ghee. Bacon fat. Tropical oils, such as coconut, palm kernel, or palm oil. Seasoning and other foods Salted popcorn and pretzels. Onion salt, garlic salt, seasoned salt, table salt, and sea salt. Worcestershire sauce. Tartar sauce. Barbecue sauce. Teriyaki sauce. Soy sauce, including reduced-sodium. Steak sauce. Canned and packaged gravies. Fish sauce. Oyster sauce. Cocktail sauce. Horseradish that you find on the shelf. Ketchup. Mustard. Meat flavorings and tenderizers. Bouillon cubes. Hot sauce and Tabasco sauce. Premade or packaged marinades. Premade or packaged taco seasonings. Relishes. Regular salad dressings. Where to find more information:  National Heart, Lung, and [place]: https://wilson-eaton.com/  American Heart Association: www.heart.org Summary  The DASH eating plan is a healthy eating plan that has been shown to reduce high blood pressure (hypertension). It may also reduce your risk for type 2 diabetes, heart disease, and stroke.  With the DASH eating plan, you should limit salt (sodium) intake to 2,300 mg a day. If you have hypertension, you may need to reduce your sodium intake to 1,500 mg a day.  When on the DASH eating plan, aim to eat more fresh fruits and vegetables, whole grains, lean proteins, low-fat dairy, and heart-healthy fats.  Work with your health care provider or diet and nutrition specialist (dietitian) to adjust your eating plan to your individual calorie needs. This information is not intended to replace advice given to you by your health care provider. Make sure you discuss any questions you  have with your health care provider. Document Revised: 11/07/2017 Document Reviewed: 11/18/2016 Elsevier Patient Education  2020 [employer].

## 2020-11-07 DIAGNOSIS — D225 Melanocytic nevi of trunk: Secondary | ICD-10-CM | POA: Diagnosis not present

## 2020-11-07 DIAGNOSIS — L680 Hirsutism: Secondary | ICD-10-CM | POA: Diagnosis not present

## 2020-11-07 DIAGNOSIS — L820 Inflamed seborrheic keratosis: Secondary | ICD-10-CM | POA: Diagnosis not present

## 2020-11-07 DIAGNOSIS — D2239 Melanocytic nevi of other parts of face: Secondary | ICD-10-CM | POA: Diagnosis not present

## 2020-12-13 ENCOUNTER — Telehealth: Payer: Self-pay

## 2020-12-13 NOTE — Chronic Care Management (AMB) (Addendum)
Chronic Care Management Pharmacy Assistant   Name: [name]  MRN: [id number] DOB: 1951/02/01  Reason for Encounter: Disease State  Patient Questions:  1.  Have you seen any other providers since your last visit? No  2.  Any changes in your medicines or health? No     PCP : Tower, Audrie Gallus, MD  Allergies:   Allergies  Allergen Reactions   Contrast Media [Iodinated Diagnostic Agents] Anaphylaxis   Shellfish-Derived Products Anaphylaxis   Septra [Sulfamethoxazole-Trimethoprim]     Nausea     Medications: Outpatient Encounter Medications as of 12/13/2020  Medication Sig   acyclovir (ZOVIRAX) 400 MG tablet TAKE 1 TABLET BY MOUTH  DAILY   calcium-vitamin D (OSCAL WITH D) 500-200 MG-UNIT per tablet Take 2 tablets by mouth daily.    fenofibrate 54 MG tablet Take 1 tablet (54 mg total) by mouth daily.   liothyronine (CYTOMEL) 25 MCG tablet TAKE ONE-HALF TABLET BY  MOUTH EVERY EVENING   mirabegron ER (MYRBETRIQ) 50 MG TB24 tablet Take 1 tablet (50 mg total) by mouth daily.   montelukast (SINGULAIR) 10 MG tablet Take 1 tablet (10 mg total) by mouth daily.   Multiple Vitamin (MULTIVITAMIN) tablet Take 1 tablet by mouth daily.    propranolol ER (INDERAL LA) 120 MG 24 hr capsule Take 1 capsule (120 mg total) by mouth daily.   SUMAtriptan (IMITREX) 100 MG tablet TAKE 1 TABLET FOR HEADACHE MAY REPEAT ONCE IN 2 HOURS IF NEEDED. MAXIMUM OF 2    TABLETS IN ONE DAY. (Patient taking differently: Take 100 mg by mouth as needed for migraine or headache. TAKE 100 mg TABLET FOR HEADACHE MAY REPEAT ONCE IN 2 HOURS IF NEEDED. MAXIMUM OF 200 mg    TABLETS IN ONE DAY.)   SYNTHROID 75 MCG tablet TAKE 1 TABLET BY MOUTH EVERY DAY   vitamin B-12 (CYANOCOBALAMIN) 500 MCG tablet Take 500 mcg by mouth daily.     No facility-administered encounter medications on file as of 12/13/2020.    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   Dyspepsia 04/14/2017   Colon cancer screening 04/09/2017   Prediabetes 03/30/2017   Medicare annual wellness visit, initial 04/04/2016   Other screening mammogram 12/31/2011   Post-menopausal 12/31/2011   Routine general medical examination at a health care facility 12/23/2011   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 Spoke with patient regarding BP  Recent Office Vitals: BP Readings from Last 3 Encounters:  05/01/20 136/88  04/26/20 138/78  04/22/19 115/79   Pulse Readings from Last 3 Encounters:  05/01/20 71  11/24/18 61  10/23/18 81    Wt Readings from Last 3 Encounters:  05/01/20 178 lb 5 oz (80.9 kg)  04/26/20 177 lb (80.3 kg)  04/22/19 160 lb (72.6 kg)     Kidney Function Lab Results  Component Value Date/Time   CREATININE 1.41 (H) 06/01/2020 08:27 AM   CREATININE 1.68 (H) 04/26/2020 08:03  AM   CREATININE 1.89 (H) 10/23/2018 04:26 PM   GFR 36.92 (L) 06/01/2020 08:27 AM   GFRNONAA 33 (L) 10/17/2018 06:39 AM   GFRAA 38 (L) 10/17/2018 06:39 AM    BMP Latest Ref Rng & Units 06/01/2020 04/26/2020 04/15/2019  Glucose 70 - 99 mg/dL 035(K) 88 90  BUN 6 - 23 mg/dL 09(F) 81(W) 29(H)  Creatinine 0.40 - 1.20 mg/dL 3.71(I) 9.67(E) 9.38(B)  BUN/Creat Ratio 6 - 22 (calc) - - -  Sodium 135 - 145 mEq/L 135 135 139  Potassium 3.5 - 5.1 mEq/L 4.8 4.6 4.2  Chloride 96 - 112 mEq/L 109 104 105  CO2 19 - 32 mEq/L 24 26 24   Calcium 8.4 - 10.5 mg/dL 9.5  9.5 9.2    Current antihypertensive regimen:  Propranolol ER 120 mg - 1 capsule daily  How often are you checking your Blood Pressure? weekly   Current home BP readings:  130/84 12/10/20 132/82 12/07/20  What recent interventions/DTPs have been made by any provider to improve Blood Pressure control since last CPP Visit:  More frequent blood pressure monitoring.   Any recent hospitalizations or ED visits since last visit with CPP? No   What diet changes have been made to improve Blood Pressure Control?  Patient denies any changes to diet to improve blood pressure.   What exercise is being done to improve your Blood Pressure Control?  No formal exercise. States she is active around the house.  Adherence Review: Is the patient currently on ACE/ARB medication? No Does the patient have >5 day gap between last estimated fill dates? CPP to review  Patient states she is doing very well. Trying to stay home to avoid covid. States she checks blood pressure about every week. Advised patient to check blood pressure every morning prior to breakfast and I would follow back up with her on 12/22/20 to review readings.  Follow-Up:  Pharmacist Review   12/24/20, CPP notified   Phil Dopp, [facility] Clinical Pharmacy Assistant [phone]  I have reviewed the care management and care coordination activities outlined in this encounter and I am certifying that I agree with the content of this note. No further action required.  [phone], PharmD Clinical Pharmacist [facility] at [facility] [phone]

## 2020-12-19 ENCOUNTER — Telehealth: Payer: Self-pay | Admitting: Family Medicine

## 2020-12-19 DIAGNOSIS — E039 Hypothyroidism, unspecified: Secondary | ICD-10-CM

## 2020-12-19 NOTE — Telephone Encounter (Signed)
-----   Message from Cloyd Stagers, RT sent at 12/04/2020  3:10 PM EST ----- Regarding: Lab Orders for Wednesday 1.12.2022 Please place lab orders for Wednesday 1.12.2022, appt notes state "recheck TSH " Thank you, Dyke Maes RT(R)

## 2020-12-19 NOTE — Telephone Encounter (Signed)
Lab order

## 2020-12-20 ENCOUNTER — Other Ambulatory Visit: Payer: Self-pay

## 2020-12-20 ENCOUNTER — Other Ambulatory Visit (INDEPENDENT_AMBULATORY_CARE_PROVIDER_SITE_OTHER): Payer: Medicare Other

## 2020-12-20 DIAGNOSIS — E039 Hypothyroidism, unspecified: Secondary | ICD-10-CM | POA: Diagnosis not present

## 2020-12-20 LAB — TSH: TSH: 19 u[IU]/mL — ABNORMAL HIGH (ref 0.35–4.50)

## 2020-12-22 ENCOUNTER — Telehealth: Payer: Self-pay

## 2020-12-22 NOTE — Chronic Care Management (AMB) (Addendum)
Chronic Care Management Pharmacy Assistant   Name: MICKELLE GOUPIL  MRN: [id number] DOB: [birth year]  Reason for Encounter: Blood Pressure Medication Review  Patient Questions:  1.  Have you seen any other providers since your last visit? No  2.  Any changes in your medicines or health? No    PCP : Tower, Wynelle Fanny, MD  Allergies:   Allergies  Allergen Reactions   Contrast Media [Iodinated Diagnostic Agents] Anaphylaxis   Shellfish-Derived Products Anaphylaxis   Septra [Sulfamethoxazole-Trimethoprim]     Nausea     Medications: Outpatient Encounter Medications as of 12/22/2020  Medication Sig   acyclovir (ZOVIRAX) 400 MG tablet TAKE 1 TABLET BY MOUTH  DAILY   calcium-vitamin D (OSCAL WITH D) 500-200 MG-UNIT per tablet Take 2 tablets by mouth daily.    fenofibrate 54 MG tablet Take 1 tablet (54 mg total) by mouth daily.   liothyronine (CYTOMEL) 25 MCG tablet TAKE ONE-HALF TABLET BY  MOUTH EVERY EVENING   mirabegron ER (MYRBETRIQ) 50 MG TB24 tablet Take 1 tablet (50 mg total) by mouth daily.   montelukast (SINGULAIR) 10 MG tablet Take 1 tablet (10 mg total) by mouth daily.   Multiple Vitamin (MULTIVITAMIN) tablet Take 1 tablet by mouth daily.    propranolol ER (INDERAL LA) 120 MG 24 hr capsule Take 1 capsule (120 mg total) by mouth daily.   SUMAtriptan (IMITREX) 100 MG tablet TAKE 1 TABLET FOR HEADACHE MAY REPEAT ONCE IN 2 HOURS IF NEEDED. MAXIMUM OF 2    TABLETS IN ONE DAY. (Patient taking differently: Take 100 mg by mouth as needed for migraine or headache. TAKE 100 mg TABLET FOR HEADACHE MAY REPEAT ONCE IN 2 HOURS IF NEEDED. MAXIMUM OF 200 mg    TABLETS IN ONE DAY.)   SYNTHROID 75 MCG tablet TAKE 1 TABLET BY MOUTH EVERY DAY   vitamin B-12 (CYANOCOBALAMIN) 500 MCG tablet Take 500 mcg by mouth daily.     No facility-administered encounter medications on file as of 12/22/2020.    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   Dyspepsia 04/14/2017   Colon cancer screening 04/09/2017   Prediabetes 03/30/2017   Medicare annual wellness visit, initial 04/04/2016   Other screening mammogram 12/31/2011   Post-menopausal 12/31/2011   Routine general medical examination at a health care facility 12/23/2011   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 Douthitt to follow up on blood pressure log. She states that her TSH was very elevated at 19.00 and feels like it may be making her blood pressure be elevated too. She states she actually feels very good and has quite a bit of energy. Denies any vision changes, headaches or shortness of breath. Readings prior to medication and breakfast are as follows:  12/17/20 158/93 12/18/20 154/76 12/19/20 163/103 12/20/20 177/99 12/21/20 148/93 12/22/20 154/95  Explained I would provide readings to [facility] and call her back with any recommendations.  Follow-Up:  Pharmacist Review   Debbora Dus, CPP  notified  Margaretmary Dys, [place] Assistant [phone]  Agree - TSH high likely causing high BP. Levothyroxine dose increased per PCP 12/20/20 with a 1 month f/u planned to check BP and TSH. Continue to monitor BP and bring log to PCP appt.   Debbora Dus, PharmD Clinical Pharmacist [facility] at [facility] [phone]

## 2020-12-28 ENCOUNTER — Telehealth: Payer: Self-pay | Admitting: *Deleted

## 2020-12-28 MED ORDER — LEVOTHYROXINE SODIUM 88 MCG PO TABS: 88.0000 ug | ORAL_TABLET | Freq: Every day | ORAL | 1 refills | Status: DC

## 2020-12-28 NOTE — Telephone Encounter (Signed)
Pt notified of Dr. Tower's comments. Rx sent to pharmacy and lab appt scheduled  

## 2020-12-28 NOTE — Telephone Encounter (Signed)
-----   Message from Abner Greenspan, MD sent at 12/21/2020  7:23 PM EST ----- Please inc levothyroxine from 75 to 88 mcg #30 3 ref and make sure she is taking it in am before food or supplements  F/u with me in about a month for BP and also to re check TSH

## 2021-01-17 ENCOUNTER — Telehealth: Payer: Self-pay

## 2021-01-17 NOTE — Chronic Care Management (AMB) (Addendum)
Chronic Care Management Pharmacy Assistant   Name: JAYLEIGH NOTARIANNI  MRN: [id number] DOB: [birth year]  Reason for Encounter: Disease state - Hypertension  PCP : Abner Greenspan, MD  Allergies:   Allergies  Allergen Reactions   Contrast Media [Iodinated Diagnostic Agents] Anaphylaxis   Shellfish-Derived Products Anaphylaxis   Septra [Sulfamethoxazole-Trimethoprim]     Nausea     Medications: Outpatient Encounter Medications as of 01/17/2021  Medication Sig   acyclovir (ZOVIRAX) 400 MG tablet TAKE 1 TABLET BY MOUTH  DAILY   calcium-vitamin D (OSCAL WITH D) 500-200 MG-UNIT per tablet Take 2 tablets by mouth daily.    fenofibrate 54 MG tablet Take 1 tablet (54 mg total) by mouth daily.   levothyroxine (SYNTHROID) 88 MCG tablet Take 1 tablet (88 mcg total) by mouth daily before breakfast.   liothyronine (CYTOMEL) 25 MCG tablet TAKE ONE-HALF TABLET BY  MOUTH EVERY EVENING   mirabegron ER (MYRBETRIQ) 50 MG TB24 tablet Take 1 tablet (50 mg total) by mouth daily.   montelukast (SINGULAIR) 10 MG tablet Take 1 tablet (10 mg total) by mouth daily.   Multiple Vitamin (MULTIVITAMIN) tablet Take 1 tablet by mouth daily.    propranolol ER (INDERAL LA) 120 MG 24 hr capsule Take 1 capsule (120 mg total) by mouth daily.   SUMAtriptan (IMITREX) 100 MG tablet TAKE 1 TABLET FOR HEADACHE MAY REPEAT ONCE IN 2 HOURS IF NEEDED. MAXIMUM OF 2    TABLETS IN ONE DAY. (Patient taking differently: Take 100 mg by mouth as needed for migraine or headache. TAKE 100 mg TABLET FOR HEADACHE MAY REPEAT ONCE IN 2 HOURS IF NEEDED. MAXIMUM OF 200 mg    TABLETS IN ONE DAY.)   SYNTHROID 75 MCG tablet TAKE 1 TABLET BY MOUTH EVERY DAY   vitamin B-12 (CYANOCOBALAMIN) 500 MCG tablet Take 500 mcg by mouth daily.     No facility-administered encounter medications on file as of 01/17/2021.    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   Dyspepsia 04/14/2017   Colon cancer screening 04/09/2017   Prediabetes 03/30/2017   Medicare annual wellness visit, initial 04/04/2016   Other screening mammogram 12/31/2011   Post-menopausal 12/31/2011   Routine general medical examination at a health care facility 12/23/2011   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 Spoke with patient regarding BP  Recent Office Vitals: BP Readings from Last 3 Encounters:  05/01/20 136/88  04/26/20 138/78  04/22/19 115/79   Pulse Readings from Last 3 Encounters:  05/01/20 71  11/24/18 61  10/23/18 81    Wt Readings from Last 3 Encounters:  05/01/20 178 lb 5 oz (80.9 kg)  04/26/20 177 lb (80.3 kg)  04/22/19 160 lb (72.6 kg)     Lab Results  Component Value Date/Time   TSH 19.00 (H) 12/20/2020 09:55 AM   TSH 0.38 08/10/2020 10:26 AM   FREET4 1.19 10/15/2018 12:46 PM   FREET4 1.20  10/14/2018 11:39 AM    Kidney Function Lab Results  Component Value Date/Time   CREATININE 1.41 (H) 06/01/2020 08:27 AM   CREATININE 1.68 (H) 04/26/2020 08:03 AM   CREATININE 1.89 (H) 10/23/2018 04:26 PM   GFR 36.92 (L) 06/01/2020 08:27 AM   GFRNONAA 33 (L) 10/17/2018 06:39 AM   GFRAA 38 (L) 10/17/2018 06:39 AM    BMP Latest Ref Rng & Units 06/01/2020 04/26/2020 04/15/2019  Glucose 70 - 99 mg/dL 104(H) 88 90  BUN 6 - 23 mg/dL 31(H) 35(H) 36(H)  Creatinine 0.40 - 1.20 mg/dL 1.41(H) 1.68(H) 1.49(H)  BUN/Creat Ratio 6 - 22 (calc) - - -   Sodium 135 - 145 mEq/L 135 135 139  Potassium 3.5 - 5.1 mEq/L 4.8 4.6 4.2  Chloride 96 - 112 mEq/L 109 104 105  CO2 19 - 32 mEq/L 24 26 24   Calcium 8.4 - 10.5 mg/dL 9.5 9.5 9.2    Current antihypertensive regimen:  Propranolol ER 120 mg - 1 capsule daily  How often are you checking your Blood Pressure? 1-2x per week  What time of day is the patient checking blood pressure? Before or after taking medication? Morning, after medication.   Current home BP readings:  DATE:             BP               PULSE 01/17/21  138/82            N/A   Wrist or arm cuff: Arm Caffeine intake: 1 Cup a day Salt intake: States she tries to limit salt but she has a hard time tasting salt so she may use more than she should.   What recent interventions/DTPs have been made by any provider to improve Blood Pressure control since last CPP Visit: Levothyroxine dose increased per PCP 12/20/20 due to TSH of 19  Any recent hospitalizations or ED visits since last visit with CPP? No  What diet changes have been made to improve Blood Pressure Control?  No recent changes.   What exercise is being done to improve your Blood Pressure Control?  States she is active around the house. No formal exercise.   Adherence Review: Is the patient currently on ACE/ARB medication? No Does the patient have >5 day gap between last estimated fill dates? CPP to review  Patient notes she has not been keeping up with blood pressure like she should. Only had 1 reading for the last couple of weeks. Asked patient to check a few times a week and keep log for upcoming appointment with Dr. Glori Bickers.   Follow-Up:  Pharmacist Review  Debbora Dus, CPP notified  Margaretmary Dys, [place] Pharmacy Assistant [phone]  Patient has appointment with Dr. Glori Bickers 2/22 to repeat TSH. Agree with content of note above.  Debbora Dus, PharmD Clinical Pharmacist [facility] at [facility] [phone]

## 2021-01-22 ENCOUNTER — Other Ambulatory Visit: Payer: Self-pay | Admitting: Family Medicine

## 2021-01-22 NOTE — Telephone Encounter (Signed)
Pharmacy requests refill on: Synthroid 88 mcg   LAST REFILL: 12/28/2020 (Q-30, R-1) LAST OV: 05/01/2020 NEXT OV: 01/29/2021 PHARMACY: [id number] [place], [place]

## 2021-01-29 ENCOUNTER — Ambulatory Visit: Payer: Medicare Other | Admitting: Family Medicine

## 2021-01-30 ENCOUNTER — Telehealth: Payer: Self-pay | Admitting: Family Medicine

## 2021-01-30 ENCOUNTER — Encounter: Payer: Self-pay | Admitting: Family Medicine

## 2021-01-30 ENCOUNTER — Other Ambulatory Visit: Payer: Self-pay

## 2021-01-30 ENCOUNTER — Ambulatory Visit (INDEPENDENT_AMBULATORY_CARE_PROVIDER_SITE_OTHER): Payer: Medicare Other | Admitting: Family Medicine

## 2021-01-30 VITALS — BP 132/88 | HR 107 | Temp 96.9°F | Ht 65.5 in | Wt 182.4 lb

## 2021-01-30 DIAGNOSIS — E039 Hypothyroidism, unspecified: Secondary | ICD-10-CM | POA: Diagnosis not present

## 2021-01-30 DIAGNOSIS — N289 Disorder of kidney and ureter, unspecified: Secondary | ICD-10-CM | POA: Diagnosis not present

## 2021-01-30 DIAGNOSIS — I10 Essential (primary) hypertension: Secondary | ICD-10-CM | POA: Diagnosis not present

## 2021-01-30 LAB — BASIC METABOLIC PANEL
BUN: 27 mg/dL — ABNORMAL HIGH (ref 6–23)
CO2: 26 mEq/L (ref 19–32)
Calcium: 10.1 mg/dL (ref 8.4–10.5)
Chloride: 101 mEq/L (ref 96–112)
Creatinine, Ser: 1.48 mg/dL — ABNORMAL HIGH (ref 0.40–1.20)
GFR: 35.74 mL/min — ABNORMAL LOW (ref >60.00)
Glucose, Bld: 143 mg/dL — ABNORMAL HIGH (ref 70–99)
Potassium: 4.8 mEq/L (ref 3.5–5.1)
Sodium: 137 mEq/L (ref 135–145)

## 2021-01-30 LAB — T3, FREE: T3, Free: 3.9 pg/mL (ref 2.3–4.2)

## 2021-01-30 LAB — TSH: TSH: 15.51 u[IU]/mL — ABNORMAL HIGH (ref 0.35–4.50)

## 2021-01-30 MED ORDER — LEVOTHYROXINE SODIUM 100 MCG PO TABS: 100.0000 ug | ORAL_TABLET | Freq: Every day | ORAL | 3 refills | Status: DC

## 2021-01-30 NOTE — Progress Notes (Signed)
Subjective:    Patient ID: Alfredia Client, female    DOB: 1951/06/21, 70 y.o.   MRN: [id number]  This visit occurred during the SARS-CoV-2 public health emergency.  Safety protocols were in place, including screening questions prior to the visit, additional usage of staff PPE, and extensive cleaning of exam room while observing appropriate contact time as indicated for disinfecting solutions.    HPI Pt presents for f/u of thyroid and bp problems  Wt Readings from Last 3 Encounters:  01/30/21 182 lb 7 oz (82.8 kg)  05/01/20 178 lb 5 oz (80.9 kg)  04/26/20 177 lb (80.3 kg)   29.90 kg/m  Hypothyroidism  Lab Results  Component Value Date   TSH 19.00 (H) 12/20/2020     Takes cytomel 25 mg daily and levothyroxine 88 mcg daily (that was inc from 75 about a month ago)  Was feeling good, now a bit more tired No missed doses     HTN bp is stable today  No cp or palpitations or headaches or edema  No side effects to medicines  BP Readings from Last 3 Encounters:  01/30/21 132/88  05/01/20 136/88  04/26/20 138/78     Propranolol ER 120 mg daily   Pulse Readings from Last 3 Encounters:  01/30/21 (!) 107  05/01/20 71  11/24/18 61   She notes that her bp goes up and down at home  150s to 130s in general  Tries to take when calm   Used to be on avalide and it was stopped in the hospital (in 2019)  Found herself using more salt so she changed to salt subs  Has trouble tasting salty thinks    Renal insuff Lab Results  Component Value Date   CREATININE 1.41 (H) 06/01/2020   BUN 31 (H) 06/01/2020   NA 135 06/01/2020   K 4.8 06/01/2020   CL 109 06/01/2020   CO2 24 06/01/2020   GFR 36.9 Improved from the draw before  Drinking lots of water    Patient Active Problem List   Diagnosis Date Noted  . Hyperkalemia 10/29/2018  . Hemorrhoids 10/23/2018  . Renal insufficiency 10/23/2018  . 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 Other dysphagia   . Elevated serum  creatinine 04/19/2018  . Estrogen deficiency 04/15/2018  . Bladder prolapse, female, 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  . Dyspepsia 04/14/2017  . Colon cancer screening 04/09/2017  . Prediabetes 03/30/2017  . Medicare annual wellness visit, initial 04/04/2016  . Other screening mammogram 12/31/2011  . Post-menopausal 12/31/2011  . Routine general medical examination at a health care facility 12/23/2011  . 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  . Hypothyroidism 11/08/2008  . HSV (herpes simplex virus) infection 09/23/2007  . Hypertriglyceridemia 09/23/2007  . Essential hypertension 09/23/2007  . ALLERGIC RHINITIS 09/23/2007  . ASTHMA 09/23/2007  . HYPOKALEMIA 04/16/2007  . Thrombocytosis ([place]) 04/09/2007   Past Medical History:  Diagnosis Date  . Allergic rhinitis   . Allergy   . Anemia    b12 def.  . Asthma    controlled w/ singulair  . Herpes simplex without mention of complication    controlled w/ meds (no current fever blisters)  . History of kidney stones   . HLD (hyperlipidemia)    borderline-  no meds  . HTN (hypertension)    echo- 2006  . Hyperthyroidism hx ptu 2006   takes synthroid; had Graves disease, was treated for that- now thyroid doesn't work  . Leukocytosis, unspecified hx of 10  yrs ago   no problem since  . Migraine   . Mixed incontinence    urge and stress incontinence  . Neuromuscular disorder ([place])    hiatal hernia  . Renal cyst right    pt is unsure of this, has had kidney stones  . Ureteral calculi right    s/p laser  litho w/ stone extraction 01-28-11   Past Surgical History:  Procedure Laterality Date  . BIOPSY  10/16/2018   Procedure: BIOPSY;  Surgeon: Milus Banister, MD;  Location: [facility] ENDOSCOPY;  Service: Endoscopy;;  . [place]  . CERVICAL FUSION  2000   fusion c4-6  . CERVICAL FUSION  2005   fusion c6-7 and plate removal J5-7  . COLONOSCOPY    . CYSTOSCOPY W/ RETROGRADES  10/21/2011   Procedure: CYSTOSCOPY WITH  RETROGRADE PYELOGRAM;  Surgeon: Ailene Rud, MD;  Location: [facility];  Service: Urology;  Laterality: Right;  CYSTOSCOPY RIGHT RETROGRADE, PYELOGRAM  URETEROSCOPY WITH HOLMIUM LASER AND STONE EXTRACTION  . CYSTOSCOPY/RETROGRADE/URETEROSCOPY/STONE EXTRACTION WITH BASKET  01-28-11   right   . ESOPHAGOGASTRODUODENOSCOPY (EGD) WITH PROPOFOL N/A 10/16/2018   Procedure: ESOPHAGOGASTRODUODENOSCOPY (EGD) WITH PROPOFOL;  Surgeon: Milus Banister, MD;  Location: [facility] ENDOSCOPY;  Service: Endoscopy;  Laterality: N/A;  . REDUCTION MAMMAPLASTY Bilateral 1995  . TONSILLECTOMY  1960  . VAGINAL HYSTERECTOMY  1987   fibroids/anemia   Social History   Tobacco Use  . Smoking status: Never Smoker  . Smokeless tobacco: Never Used  Vaping Use  . Vaping Use: Never used  Substance Use Topics  . Alcohol use: No    Alcohol/week: 0.0 standard drinks  . Drug use: No   Family History  Problem Relation Age of Onset  . Heart failure Father   . Osteoporosis Mother   . Diabetes Mother   . Diabetes Other        Grandfather  . Coronary artery disease Other        Grandmother  . Breast cancer Neg Hx   . Colon cancer Neg Hx   . Esophageal cancer Neg Hx   . Stomach cancer Neg Hx   . Rectal cancer Neg Hx    Allergies  Allergen Reactions  . Contrast Media [Iodinated Diagnostic Agents] Anaphylaxis  . Shellfish-Derived Products Anaphylaxis  . Septra [Sulfamethoxazole-Trimethoprim]     Nausea    Current Outpatient Medications on File Prior to Visit  Medication Sig Dispense Refill  . acyclovir (ZOVIRAX) 400 MG tablet TAKE 1 TABLET BY MOUTH  DAILY 90 tablet 1  . calcium-vitamin D (OSCAL WITH D) 500-200 MG-UNIT per tablet Take 2 tablets by mouth daily.    . fenofibrate 54 MG tablet Take 1 tablet (54 mg total) by mouth daily. 90 tablet 3  . levothyroxine (SYNTHROID) 88 MCG tablet Take 1 tablet (88 mcg total) by mouth daily before breakfast. 30 tablet 1  . liothyronine (CYTOMEL) 25 MCG  tablet TAKE ONE-HALF TABLET BY  MOUTH EVERY EVENING 45 tablet 3  . mirabegron ER (MYRBETRIQ) 50 MG TB24 tablet Take 1 tablet (50 mg total) by mouth daily. 90 tablet 3  . montelukast (SINGULAIR) 10 MG tablet Take 1 tablet (10 mg total) by mouth daily. 90 tablet 3  . Multiple Vitamin (MULTIVITAMIN) tablet Take 1 tablet by mouth daily.    . propranolol ER (INDERAL LA) 120 MG 24 hr capsule Take 1 capsule (120 mg total) by mouth daily. 90 capsule 3  . SUMAtriptan (IMITREX) 100 MG tablet  TAKE 1 TABLET FOR HEADACHE MAY REPEAT ONCE IN 2 HOURS IF NEEDED. MAXIMUM OF 2    TABLETS IN ONE DAY. (Patient taking differently: Take 100 mg by mouth as needed for migraine or headache. TAKE 100 mg TABLET FOR HEADACHE MAY REPEAT ONCE IN 2 HOURS IF NEEDED. MAXIMUM OF 200 mg    TABLETS IN ONE DAY.) 27 tablet 3  . vitamin B-12 (CYANOCOBALAMIN) 500 MCG tablet Take 500 mcg by mouth daily.     No current facility-administered medications on file prior to visit.     Review of Systems  Constitutional: Positive for fatigue. Negative for activity change, appetite change, fever and unexpected weight change.  HENT: Negative for congestion, ear pain, rhinorrhea, sinus pressure and sore throat.   Eyes: Negative for pain, redness and visual disturbance.  Respiratory: Negative for cough, shortness of breath and wheezing.   Cardiovascular: Negative for chest pain and palpitations.  Gastrointestinal: Negative for abdominal pain, blood in stool, constipation and diarrhea.  Endocrine: Negative for polydipsia and polyuria.  Genitourinary: Negative for dysuria, frequency and urgency.  Musculoskeletal: Negative for arthralgias, back pain and myalgias.  Skin: Negative for pallor and rash.  Allergic/Immunologic: Negative for environmental allergies.  Neurological: Negative for dizziness, syncope and headaches.  Hematological: Negative for adenopathy. Does not bruise/bleed easily.  Psychiatric/Behavioral: Negative for decreased  concentration and dysphoric mood. The patient is not nervous/anxious.        Objective:   Physical Exam Constitutional:      General: She is not in acute distress.    Appearance: She is well-developed and well-nourished.  HENT:     Head: Normocephalic and atraumatic.     Mouth/Throat:     Mouth: Oropharynx is clear and moist.  Eyes:     Extraocular Movements: EOM normal.     Conjunctiva/sclera: Conjunctivae normal.     Pupils: Pupils are equal, round, and reactive to light.  Neck:     Thyroid: No thyromegaly.     Vascular: No carotid bruit or JVD.  Cardiovascular:     Rate and Rhythm: Normal rate and regular rhythm.     Pulses: Intact distal pulses.     Heart sounds: Normal heart sounds. No gallop.   Pulmonary:     Effort: Pulmonary effort is normal. No respiratory distress.     Breath sounds: Normal breath sounds. No wheezing or rales.     Comments: No crackles Abdominal:     General: Bowel sounds are normal. There is no abdominal bruit.     Palpations: Abdomen is soft. There is no mass.  Musculoskeletal:        General: No edema.     Cervical back: Normal range of motion and neck supple.     Right lower leg: No edema.     Left lower leg: No edema.  Lymphadenopathy:     Cervical: No cervical adenopathy.  Skin:    General: Skin is warm and dry.     Coloration: Skin is not pale.     Findings: No erythema or rash.  Neurological:     Mental Status: She is alert.     Motor: No tremor.     Deep Tendon Reflexes: Reflexes are normal and symmetric. Reflexes normal.  Psychiatric:        Mood and Affect: Mood and affect and mood normal.           Assessment & Plan:   Problem List Items Addressed This Visit      Cardiovascular  and Mediastinum   Essential hypertension - Primary    bp in fair control at this time  BP Readings from Last 1 Encounters:  01/30/21 132/88   No changes needed Most recent labs reviewed  Disc lifstyle change with low sodium diet and  exercise  More labile at home- will check when relaxed in the evening  Plan to continue propranolol ER 120 mg daily and continue to monitor      Relevant Orders   TSH   Basic metabolic panel     Endocrine   Hypothyroidism    Hypothyroidism  Pt oddly felt more energetic when tsh was high  No change in energy level/ hair or skin/ edema and no tremor Lab Results  Component Value Date   TSH 19.00 (H) 12/20/2020    Labs today then refill levothy       Relevant Orders   TSH   T3, Free     Genitourinary   Renal insufficiency    Reviewed last labs with GFR of 36.9  Pending labs today drinking more water Adv to avoid renal doxic meds like nsaids and keep drinking water  She was prev on avalide before 2019  bp is controlled      Relevant Orders   Basic metabolic panel

## 2021-01-30 NOTE — Assessment & Plan Note (Addendum)
Reviewed last labs with GFR of 36.9  Pending labs today drinking more water Adv to avoid renal doxic meds like nsaids and keep drinking water  She was prev on avalide before 2019  bp is controlled

## 2021-01-30 NOTE — Patient Instructions (Addendum)
Keep up a good fluids and avoid excessive sodium Keep exercising   I will send in levothy as soon as we get labs back   Thyroid and kidney function today   Take care of yourself  Blood pressure is reassuring - make sure to check it when relaxed

## 2021-01-30 NOTE — Assessment & Plan Note (Signed)
Hypothyroidism  Pt oddly felt more energetic when tsh was high  No change in energy level/ hair or skin/ edema and no tremor Lab Results  Component Value Date   TSH 19.00 (H) 12/20/2020    Labs today then refill levothy

## 2021-01-30 NOTE — Assessment & Plan Note (Signed)
bp in fair control at this time  BP Readings from Last 1 Encounters:  01/30/21 132/88   No changes needed Most recent labs reviewed  Disc lifstyle change with low sodium diet and exercise  More labile at home- will check when relaxed in the evening  Plan to continue propranolol ER 120 mg daily and continue to monitor

## 2021-01-30 NOTE — Telephone Encounter (Signed)
TSH is still high  I went up again on levothyroxine to 100 mcg daily  Re check 4-6 wk TSH Other labs stable

## 2021-01-31 NOTE — Telephone Encounter (Signed)
Pt notified of lab results and Dr. Marliss Coots comments, f/u lab appt scheduled and pt will start on new dose

## 2021-02-13 ENCOUNTER — Other Ambulatory Visit: Payer: Self-pay | Admitting: Family Medicine

## 2021-03-01 ENCOUNTER — Other Ambulatory Visit: Payer: Self-pay | Admitting: Family Medicine

## 2021-03-05 ENCOUNTER — Telehealth: Payer: Self-pay | Admitting: Family Medicine

## 2021-03-05 DIAGNOSIS — E039 Hypothyroidism, unspecified: Secondary | ICD-10-CM

## 2021-03-05 NOTE — Telephone Encounter (Signed)
-----   Message from Ellamae Sia sent at 02/19/2021 10:05 AM EDT ----- Regarding: Lab orders for Tuesday, 3.29.22 Lab order for TSH?

## 2021-03-06 ENCOUNTER — Other Ambulatory Visit (INDEPENDENT_AMBULATORY_CARE_PROVIDER_SITE_OTHER): Payer: Medicare Other

## 2021-03-06 ENCOUNTER — Other Ambulatory Visit: Payer: Self-pay

## 2021-03-06 DIAGNOSIS — E039 Hypothyroidism, unspecified: Secondary | ICD-10-CM | POA: Diagnosis not present

## 2021-03-06 LAB — TSH: TSH: 15.57 u[IU]/mL — ABNORMAL HIGH (ref 0.35–4.50)

## 2021-03-08 ENCOUNTER — Telehealth: Payer: Self-pay | Admitting: *Deleted

## 2021-03-08 ENCOUNTER — Other Ambulatory Visit: Payer: Self-pay | Admitting: Family Medicine

## 2021-03-08 DIAGNOSIS — Z1231 Encounter for screening mammogram for malignant neoplasm of breast: Secondary | ICD-10-CM

## 2021-03-08 MED ORDER — LEVOTHYROXINE SODIUM 125 MCG PO TABS: 125.0000 ug | ORAL_TABLET | Freq: Every day | ORAL | 3 refills | Status: DC

## 2021-03-08 NOTE — Telephone Encounter (Signed)
-----   Message from Abner Greenspan, MD sent at 03/06/2021  8:22 PM EDT ----- TSH is still elevated on current dose of levothyroxine (100 mg)  I want to increase to 125 mcg daily #30 3 ref and re check TSH in 4-6 wk  If no further improvement would recommend f/u with an endocrinologist

## 2021-03-08 NOTE — Telephone Encounter (Signed)
Pt notified of lab results and Dr. Marliss Coots comments. New Rx sent into pharmacy and lab appt scheduled pt agrees with endo referral if needed after next lab appt

## 2021-03-09 ENCOUNTER — Encounter: Payer: Self-pay | Admitting: Family Medicine

## 2021-03-19 ENCOUNTER — Telehealth: Payer: Self-pay

## 2021-03-19 NOTE — Chronic Care Management (AMB) (Addendum)
    Chronic Care Management Pharmacy Assistant   Name: [name]  MRN: [id number] DOB: [birth year]   Reason for Encounter: Disease State  General    Conditions to be addressed/monitored: HTN  Recent office visits:  03/08/2021 PCP, telephone call - TSH elevated, increased Levothyroxine to 125 mcg daily 01/30/2021 Dr.Tower, PCP - Increase Levothyroxine 100 mcg daily  Recent consult visits:  None since last CCM contact  Hospital visits:  None in previous 6 months  Medications: Outpatient Encounter Medications as of 03/19/2021  Medication Sig   acyclovir (ZOVIRAX) 400 MG tablet TAKE 1 TABLET BY MOUTH  DAILY   calcium-vitamin D (OSCAL WITH D) 500-200 MG-UNIT per tablet Take 2 tablets by mouth daily.   fenofibrate 54 MG tablet TAKE 1 TABLET BY MOUTH  DAILY   levothyroxine (SYNTHROID) 125 MCG tablet Take 1 tablet (125 mcg total) by mouth daily.   liothyronine (CYTOMEL) 25 MCG tablet TAKE ONE-HALF TABLET BY  MOUTH EVERY EVENING   mirabegron ER (MYRBETRIQ) 50 MG TB24 tablet Take 1 tablet (50 mg total) by mouth daily.   montelukast (SINGULAIR) 10 MG tablet Take 1 tablet (10 mg total) by mouth daily.   Multiple Vitamin (MULTIVITAMIN) tablet Take 1 tablet by mouth daily.   propranolol ER (INDERAL LA) 120 MG 24 hr capsule Take 1 capsule (120 mg total) by mouth daily.   SUMAtriptan (IMITREX) 100 MG tablet TAKE 1 TABLET FOR HEADACHE MAY REPEAT ONCE IN 2 HOURS IF NEEDED. MAXIMUM OF 2    TABLETS IN ONE DAY. (Patient taking differently: Take 100 mg by mouth as needed for migraine or headache. TAKE 100 mg TABLET FOR HEADACHE MAY REPEAT ONCE IN 2 HOURS IF NEEDED. MAXIMUM OF 200 mg    TABLETS IN ONE DAY.)   vitamin B-12 (CYANOCOBALAMIN) 500 MCG tablet Take 500 mcg by mouth daily.   No facility-administered encounter medications on file as of 03/19/2021.    Contacted [name] for general disease state call. Since last visit with CPP, the following interventions have been made: Increase  Levothyroxine from 100 mcg to 125 mcg daily and take on empty stomach 1 hour prior to breakfast or other medications.The patient  has not had an ED visit since last CCM contact. The patient's current Chronic and PDC medications are: NONE. The patient denies problems with their health. The patient denies  problems with her pharmacy. The patient denies side effects with her medications. she denies concerns or questions for Debbora Dus, PharmD at this time.  She is trying to exercise and stay active with getting outside. She reports she is eating healthy meals  Home blood pressure log review: 03/12/2021  BP 142/86       Follow-Up:  Pharmacist Review  Debbora Dus, CPP notified  [facility] Assistant [phone]  I have reviewed the care management and care coordination activities outlined in this encounter and I am certifying that I agree with the content of this note. PCP reported labile BP at last visit and encouraged patient to check BP when relaxed in the evening. PCP visit May 2022. Will follow up with BP log review in June.   Debbora Dus, PharmD Clinical Pharmacist [facility] [phone]

## 2021-04-04 ENCOUNTER — Ambulatory Visit
Admission: RE | Admit: 2021-04-04 | Discharge: 2021-04-04 | Disposition: A | Discharge status: Home / Self Care | Payer: Medicare Other | Source: Ambulatory Visit | Attending: Family Medicine | Admitting: Family Medicine

## 2021-04-04 ENCOUNTER — Inpatient Hospital Stay: Admission: RE | Admit: 2021-04-04 | Payer: Medicare Other | Source: Ambulatory Visit

## 2021-04-04 ENCOUNTER — Other Ambulatory Visit: Payer: Self-pay

## 2021-04-04 DIAGNOSIS — Z1231 Encounter for screening mammogram for malignant neoplasm of breast: Secondary | ICD-10-CM

## 2021-04-05 ENCOUNTER — Other Ambulatory Visit: Payer: Self-pay | Admitting: Family Medicine

## 2021-04-09 ENCOUNTER — Telehealth: Payer: Self-pay | Admitting: Family Medicine

## 2021-04-09 DIAGNOSIS — E039 Hypothyroidism, unspecified: Secondary | ICD-10-CM

## 2021-04-09 NOTE — Telephone Encounter (Signed)
-----   Message from Ellamae Sia sent at 03/27/2021 11:37 AM EDT ----- Regarding: Lab orders for Tuesday, 5.3.22 Lab orders for Thyroid ?

## 2021-04-10 ENCOUNTER — Other Ambulatory Visit (INDEPENDENT_AMBULATORY_CARE_PROVIDER_SITE_OTHER): Payer: Medicare Other

## 2021-04-10 ENCOUNTER — Other Ambulatory Visit: Payer: Self-pay

## 2021-04-10 DIAGNOSIS — E039 Hypothyroidism, unspecified: Secondary | ICD-10-CM

## 2021-04-10 LAB — TSH: TSH: 0.68 u[IU]/mL (ref 0.35–4.50)

## 2021-04-11 ENCOUNTER — Other Ambulatory Visit: Payer: Self-pay | Admitting: Family Medicine

## 2021-04-24 ENCOUNTER — Telehealth: Payer: Self-pay | Admitting: Family Medicine

## 2021-04-24 DIAGNOSIS — E039 Hypothyroidism, unspecified: Secondary | ICD-10-CM

## 2021-04-24 DIAGNOSIS — R7303 Prediabetes: Secondary | ICD-10-CM

## 2021-04-24 DIAGNOSIS — E781 Pure hyperglyceridemia: Secondary | ICD-10-CM

## 2021-04-24 DIAGNOSIS — I10 Essential (primary) hypertension: Secondary | ICD-10-CM

## 2021-04-24 DIAGNOSIS — D75839 Thrombocytosis, unspecified: Secondary | ICD-10-CM

## 2021-04-24 NOTE — Telephone Encounter (Signed)
-----   Message from Ellamae Sia sent at 04/09/2021 11:55 AM EDT ----- Regarding: Lab orders for Wednesday, 5.18.22 Patient is scheduled for CPX labs, please order future labs, Thanks , Karna Christmas

## 2021-04-25 ENCOUNTER — Other Ambulatory Visit: Payer: Self-pay

## 2021-04-25 ENCOUNTER — Other Ambulatory Visit (INDEPENDENT_AMBULATORY_CARE_PROVIDER_SITE_OTHER): Payer: Medicare Other

## 2021-04-25 DIAGNOSIS — D75839 Thrombocytosis, unspecified: Secondary | ICD-10-CM | POA: Diagnosis not present

## 2021-04-25 DIAGNOSIS — E781 Pure hyperglyceridemia: Secondary | ICD-10-CM | POA: Diagnosis not present

## 2021-04-25 DIAGNOSIS — R7303 Prediabetes: Secondary | ICD-10-CM

## 2021-04-25 DIAGNOSIS — I10 Essential (primary) hypertension: Secondary | ICD-10-CM | POA: Diagnosis not present

## 2021-04-25 DIAGNOSIS — E039 Hypothyroidism, unspecified: Secondary | ICD-10-CM

## 2021-04-25 LAB — COMPREHENSIVE METABOLIC PANEL
ALT: 15 U/L (ref 0–35)
AST: 15 U/L (ref 0–37)
Albumin: 4.6 g/dL (ref 3.5–5.2)
Alkaline Phosphatase: 80 U/L (ref 39–117)
BUN: 32 mg/dL — ABNORMAL HIGH (ref 6–23)
CO2: 24 mEq/L (ref 19–32)
Calcium: 10.1 mg/dL (ref 8.4–10.5)
Chloride: 104 mEq/L (ref 96–112)
Creatinine, Ser: 1.49 mg/dL — ABNORMAL HIGH (ref 0.40–1.20)
GFR: 35.39 mL/min — ABNORMAL LOW (ref >60.00)
Glucose, Bld: 107 mg/dL — ABNORMAL HIGH (ref 70–99)
Potassium: 5.1 mEq/L (ref 3.5–5.1)
Sodium: 139 mEq/L (ref 135–145)
Total Bilirubin: 0.5 mg/dL (ref 0.2–1.2)
Total Protein: 7.3 g/dL (ref 6.0–8.3)

## 2021-04-25 LAB — CBC WITH DIFFERENTIAL/PLATELET
Basophils Absolute: 0.1 10*3/uL (ref 0.0–0.1)
Basophils Relative: 1.2 % (ref 0.0–3.0)
Eosinophils Absolute: 0.2 10*3/uL (ref 0.0–0.7)
Eosinophils Relative: 2.5 % (ref 0.0–5.0)
HCT: 39.3 % (ref 36.0–46.0)
Hemoglobin: 12.8 g/dL (ref 12.0–15.0)
Lymphocytes Relative: 32.6 % (ref 12.0–46.0)
Lymphs Abs: 2.5 10*3/uL (ref 0.7–4.0)
MCHC: 32.7 g/dL (ref 30.0–36.0)
MCV: 88.5 fl (ref 78.0–100.0)
Monocytes Absolute: 0.4 10*3/uL (ref 0.1–1.0)
Monocytes Relative: 5.9 % (ref 3.0–12.0)
Neutro Abs: 4.4 10*3/uL (ref 1.4–7.7)
Neutrophils Relative %: 57.8 % (ref 43.0–77.0)
Platelets: 499 10*3/uL — ABNORMAL HIGH (ref 150.0–400.0)
RBC: 4.44 Mil/uL (ref 3.87–5.11)
RDW: 13.7 % (ref 11.5–15.5)
WBC: 7.6 10*3/uL (ref 4.0–10.5)

## 2021-04-25 LAB — LDL CHOLESTEROL, DIRECT: Direct LDL: 93 mg/dL

## 2021-04-25 LAB — TSH: TSH: 0.19 u[IU]/mL — ABNORMAL LOW (ref 0.35–4.50)

## 2021-04-25 LAB — LIPID PANEL
Cholesterol: 208 mg/dL — ABNORMAL HIGH (ref 0–200)
HDL: 74.8 mg/dL (ref >39.00)
NonHDL: 133.35
Total CHOL/HDL Ratio: 3
Triglycerides: 262 mg/dL — ABNORMAL HIGH (ref 0.0–149.0)
VLDL: 52.4 mg/dL — ABNORMAL HIGH (ref 0.0–40.0)

## 2021-04-25 LAB — HEMOGLOBIN A1C: Hgb A1c MFr Bld: 5.8 % (ref 4.6–6.5)

## 2021-04-27 ENCOUNTER — Ambulatory Visit (INDEPENDENT_AMBULATORY_CARE_PROVIDER_SITE_OTHER): Payer: Medicare Other

## 2021-04-27 ENCOUNTER — Other Ambulatory Visit: Payer: Self-pay

## 2021-04-27 VITALS — BP 136/83 | HR 90

## 2021-04-27 DIAGNOSIS — Z0000 Encounter for general adult medical examination without abnormal findings: Secondary | ICD-10-CM

## 2021-04-27 NOTE — Patient Instructions (Signed)
[name] , Thank you for taking time to come for your Medicare Wellness Visit. I appreciate your ongoing commitment to your health goals. Please review the following plan we discussed and let me know if I can assist you in the future.   Screening recommendations/referrals: Colonoscopy: Up to date, completed 05/19/2018, due 05/2028 Mammogram: Up to date, completed 04/04/2021, due 03/2022 Bone Density: Up to date, completed 05/25/2018, due in 2-5 years  Recommended yearly ophthalmology/optometry visit for glaucoma screening and checkup Recommended yearly dental visit for hygiene and checkup  Vaccinations: Influenza vaccine: Up to date, completed 08/17/2020, due 07/2021 Pneumococcal vaccine: Completed series Tdap vaccine: Up to date, completed 03/20/2015, due 03/2025 Shingles vaccine: Completed series   Covid-19: Completed series  Advanced directives: Please bring a copy of your POA (Power of [place]) and/or Living Will to your next appointment.   Conditions/risks identified: hypertension  Next appointment: Follow up in one year for your annual wellness visit    Preventive Care 71 Years and Older, Female Preventive care refers to lifestyle choices and visits with your health care provider that can promote health and wellness. What does preventive care include?  A yearly physical exam. This is also called an annual well check.  Dental exams once or twice a year.  Routine eye exams. Ask your health care provider how often you should have your eyes checked.  Personal lifestyle choices, including:  Daily care of your teeth and gums.  Regular physical activity.  Eating a healthy diet.  Avoiding tobacco and drug use.  Limiting alcohol use.  Practicing safe sex.  Taking low-dose aspirin every day.  Taking vitamin and mineral supplements as recommended by your health care provider. What happens during an annual well check? The services and screenings done by your health care  provider during your annual well check will depend on your age, overall health, lifestyle risk factors, and family history of disease. Counseling  Your health care provider may ask you questions about your:  Alcohol use.  Tobacco use.  Drug use.  Emotional well-being.  Home and relationship well-being.  Sexual activity.  Eating habits.  History of falls.  Memory and ability to understand (cognition).  Work and work Statistician.  Reproductive health. Screening  You may have the following tests or measurements:  Height, weight, and BMI.  Blood pressure.  Lipid and cholesterol levels. These may be checked every 5 years, or more frequently if you are over 22 years old.  Skin check.  Lung cancer screening. You may have this screening every year starting at age 38 if you have a 30-pack-year history of smoking and currently smoke or have quit within the past 15 years.  Fecal occult blood test (FOBT) of the stool. You may have this test every year starting at age 30.  Flexible sigmoidoscopy or colonoscopy. You may have a sigmoidoscopy every 5 years or a colonoscopy every 10 years starting at age 17.  Hepatitis C blood test.  Hepatitis B blood test.  Sexually transmitted disease (STD) testing.  Diabetes screening. This is done by checking your blood sugar (glucose) after you have not eaten for a while (fasting). You may have this done every 1-3 years.  Bone density scan. This is done to screen for osteoporosis. You may have this done starting at age 31.  Mammogram. This may be done every 1-2 years. Talk to your health care provider about how often you should have regular mammograms. Talk with your health care provider about your test results,  treatment options, and if necessary, the need for more tests. Vaccines  Your health care provider may recommend certain vaccines, such as:  Influenza vaccine. This is recommended every year.  Tetanus, diphtheria, and acellular  pertussis (Tdap, Td) vaccine. You may need a Td booster every 10 years.  Zoster vaccine. You may need this after age 46.  Pneumococcal 13-valent conjugate (PCV13) vaccine. One dose is recommended after age 85.  Pneumococcal polysaccharide (PPSV23) vaccine. One dose is recommended after age 66. Talk to your health care provider about which screenings and vaccines you need and how often you need them. This information is not intended to replace advice given to you by your health care provider. Make sure you discuss any questions you have with your health care provider. Document Released: 12/22/2015 Document Revised: 08/14/2016 Document Reviewed: 09/26/2015 Elsevier Interactive Patient Education  2017 [place] Prevention in the Home Falls can cause injuries. They can happen to people of all ages. There are many things you can do to make your home safe and to help prevent falls. What can I do on the outside of my home?  Regularly fix the edges of walkways and driveways and fix any cracks.  Remove anything that might make you trip as you walk through a door, such as a raised step or threshold.  Trim any bushes or trees on the path to your home.  Use bright outdoor lighting.  Clear any walking paths of anything that might make someone trip, such as rocks or tools.  Regularly check to see if handrails are loose or broken. Make sure that both sides of any steps have handrails.  Any raised decks and porches should have guardrails on the edges.  Have any leaves, snow, or ice cleared regularly.  Use sand or salt on walking paths during winter.  Clean up any spills in your garage right away. This includes oil or grease spills. What can I do in the bathroom?  Use night lights.  Install grab bars by the toilet and in the tub and shower. Do not use towel bars as grab bars.  Use non-skid mats or decals in the tub or shower.  If you need to sit down in the shower, use a plastic,  non-slip stool.  Keep the floor dry. Clean up any water that spills on the floor as soon as it happens.  Remove soap buildup in the tub or shower regularly.  Attach bath mats securely with double-sided non-slip rug tape.  Do not have throw rugs and other things on the floor that can make you trip. What can I do in the bedroom?  Use night lights.  Make sure that you have a light by your bed that is easy to reach.  Do not use any sheets or blankets that are too big for your bed. They should not hang down onto the floor.  Have a firm chair that has side arms. You can use this for support while you get dressed.  Do not have throw rugs and other things on the floor that can make you trip. What can I do in the kitchen?  Clean up any spills right away.  Avoid walking on wet floors.  Keep items that you use a lot in easy-to-reach places.  If you need to reach something above you, use a strong step stool that has a grab bar.  Keep electrical cords out of the way.  Do not use floor polish or wax that makes floors  slippery. If you must use wax, use non-skid floor wax.  Do not have throw rugs and other things on the floor that can make you trip. What can I do with my stairs?  Do not leave any items on the stairs.  Make sure that there are handrails on both sides of the stairs and use them. Fix handrails that are broken or loose. Make sure that handrails are as long as the stairways.  Check any carpeting to make sure that it is firmly attached to the stairs. Fix any carpet that is loose or worn.  Avoid having throw rugs at the top or bottom of the stairs. If you do have throw rugs, attach them to the floor with carpet tape.  Make sure that you have a light switch at the top of the stairs and the bottom of the stairs. If you do not have them, ask someone to add them for you. What else can I do to help prevent falls?  Wear shoes that:  Do not have high heels.  Have rubber  bottoms.  Are comfortable and fit you well.  Are closed at the toe. Do not wear sandals.  If you use a stepladder:  Make sure that it is fully opened. Do not climb a closed stepladder.  Make sure that both sides of the stepladder are locked into place.  Ask someone to hold it for you, if possible.  Clearly mark and make sure that you can see:  Any grab bars or handrails.  First and last steps.  Where the edge of each step is.  Use tools that help you move around (mobility aids) if they are needed. These include:  Canes.  Walkers.  Scooters.  Crutches.  Turn on the lights when you go into a dark area. Replace any light bulbs as soon as they burn out.  Set up your furniture so you have a clear path. Avoid moving your furniture around.  If any of your floors are uneven, fix them.  If there are any pets around you, be aware of where they are.  Review your medicines with your doctor. Some medicines can make you feel dizzy. This can increase your chance of falling. Ask your doctor what other things that you can do to help prevent falls. This information is not intended to replace advice given to you by your health care provider. Make sure you discuss any questions you have with your health care provider. Document Released: 09/21/2009 Document Revised: 05/02/2016 Document Reviewed: 12/30/2014 Elsevier Interactive Patient Education  2017 [employer]..

## 2021-04-27 NOTE — Progress Notes (Signed)
PCP notes:  Health Maintenance: No gaps noted   Abnormal Screenings: none   Patient concerns: none   Nurse concerns: none   Next PCP appt.: 05/02/2021 @ 9 am

## 2021-04-27 NOTE — Progress Notes (Addendum)
Subjective:   [name] is a 70 y.o. female who presents for Medicare Annual (Subsequent) preventive examination.  Review of Systems: N/A      I connected with the patient today by telephone and verified that I am speaking with the correct person using two identifiers. Location patient: home Location nurse: work Persons participating in the telephone visit: patient, nurse.   I discussed the limitations, risks, security and privacy concerns of performing an evaluation and management service by telephone and the availability of in person appointments. I also discussed with the patient that there may be a patient responsible charge related to this service. The patient expressed understanding and verbally consented to this telephonic visit.        Cardiac Risk Factors include: advanced age (>89men, >83 women);hypertension     Objective:    Today's Vitals   04/27/21 0944  BP: 136/83  Pulse: 90  SpO2: 97%   There is no height or weight on file to calculate BMI.  Advanced Directives 04/27/2021 04/26/2020 04/14/2019 10/14/2018 05/19/2018 04/08/2018 04/04/2017  Does Patient Have a Medical Advance Directive? Yes Yes Yes Yes Yes Yes Yes  Type of Paramedic of [place];Living will [place];Living will [place];Living will [place];Living will Healthcare Power of [place];Living will [place];Living will  Does patient want to make changes to medical advance directive? - - - No - Patient declined - - -  Copy of [place] in Chart? No - copy requested Yes - validated most recent copy scanned in chart (See row information) Yes - validated most recent copy scanned in chart (See row information) Yes - validated most recent copy scanned in chart (See row information) - Yes Yes    Current Medications (verified) Outpatient Encounter Medications as of  04/27/2021  Medication Sig  . acyclovir (ZOVIRAX) 400 MG tablet TAKE 1 TABLET BY MOUTH  DAILY  . calcium-vitamin D (OSCAL WITH D) 500-200 MG-UNIT per tablet Take 2 tablets by mouth daily.  . fenofibrate 54 MG tablet TAKE 1 TABLET BY MOUTH  DAILY  . levothyroxine (SYNTHROID) 125 MCG tablet Take 1 tablet (125 mcg total) by mouth daily.  Marland Kitchen liothyronine (CYTOMEL) 25 MCG tablet TAKE ONE-HALF TABLET BY  MOUTH EVERY EVENING  . montelukast (SINGULAIR) 10 MG tablet TAKE 1 TABLET BY MOUTH  DAILY  . Multiple Vitamin (MULTIVITAMIN) tablet Take 1 tablet by mouth daily.  Marland Kitchen MYRBETRIQ 50 MG TB24 tablet TAKE 1 TABLET BY MOUTH  DAILY  . propranolol ER (INDERAL LA) 120 MG 24 hr capsule TAKE 1 CAPSULE BY MOUTH  DAILY  . SUMAtriptan (IMITREX) 100 MG tablet TAKE 1 TABLET FOR HEADACHE MAY REPEAT ONCE IN 2 HOURS IF NEEDED. MAXIMUM OF 2    TABLETS IN ONE DAY. (Patient taking differently: Take 100 mg by mouth as needed for migraine or headache. TAKE 100 mg TABLET FOR HEADACHE MAY REPEAT ONCE IN 2 HOURS IF NEEDED. MAXIMUM OF 200 mg    TABLETS IN ONE DAY.)  . vitamin B-12 (CYANOCOBALAMIN) 500 MCG tablet Take 500 mcg by mouth daily.   No facility-administered encounter medications on file as of 04/27/2021.    Allergies (verified) Contrast media [iodinated diagnostic agents], Shellfish-derived products, and Septra [sulfamethoxazole-trimethoprim]   History: Past Medical History:  Diagnosis Date  . Allergic rhinitis   . Allergy   . Anemia    b12 def.  . Asthma  controlled w/ singulair  . Herpes simplex without mention of complication    controlled w/ meds (no current fever blisters)  . History of kidney stones   . HLD (hyperlipidemia)    borderline-  no meds  . HTN (hypertension)    echo- 2006  . Hyperthyroidism hx ptu 2006   takes synthroid; had Graves disease, was treated for that- now thyroid doesn't work  . Leukocytosis, unspecified hx of 10 yrs ago   no problem since  . Migraine   . Mixed incontinence     urge and stress incontinence  . Neuromuscular disorder ([place])    hiatal hernia  . Renal cyst right    pt is unsure of this, has had kidney stones  . Ureteral calculi right    s/p laser  litho w/ stone extraction 01-28-11   Past Surgical History:  Procedure Laterality Date  . BIOPSY  10/16/2018   Procedure: BIOPSY;  Surgeon: Milus Banister, MD;  Location[facility] ENDOSCOPY;  Service: Endoscopy;;  . [place]  . CERVICAL FUSION  2000   fusion c4-6  . CERVICAL FUSION  2005   fusion c6-7 and plate removal J6-9  . COLONOSCOPY    . CYSTOSCOPY W/ RETROGRADES  10/21/2011   Procedure: CYSTOSCOPY WITH RETROGRADE PYELOGRAM;  Surgeon: Ailene Rud, MD;  Location: [facility];  Service: Urology;  Laterality: Right;  CYSTOSCOPY RIGHT RETROGRADE, PYELOGRAM  URETEROSCOPY WITH HOLMIUM LASER AND STONE EXTRACTION  . CYSTOSCOPY/RETROGRADE/URETEROSCOPY/STONE EXTRACTION WITH BASKET  01-28-11   right   . ESOPHAGOGASTRODUODENOSCOPY (EGD) WITH PROPOFOL N/A 10/16/2018   Procedure: ESOPHAGOGASTRODUODENOSCOPY (EGD) WITH PROPOFOL;  Surgeon: Milus Banister, MD;  Location: [facility] ENDOSCOPY;  Service: Endoscopy;  Laterality: N/A;  . REDUCTION MAMMAPLASTY Bilateral 1995  . TONSILLECTOMY  1960  . VAGINAL HYSTERECTOMY  1987   fibroids/anemia   Family History  Problem Relation Age of Onset  . Heart failure Father   . Osteoporosis Mother   . Diabetes Mother   . Diabetes Other        Grandfather  . Coronary artery disease Other        Grandmother  . Breast cancer Neg Hx   . Colon cancer Neg Hx   . Esophageal cancer Neg Hx   . Stomach cancer Neg Hx   . Rectal cancer Neg Hx    Social History   Socioeconomic History  . Marital status: Married    Spouse name: Not on file  . Number of children: 1  . Years of education: Not on file  . Highest education level: Not on file  Occupational History  . Occupation: Publishing rights manager: [employer]  Tobacco Use  .  Smoking status: Never Smoker  . Smokeless tobacco: Never Used  Vaping Use  . Vaping Use: Never used  Substance and Sexual Activity  . Alcohol use: No    Alcohol/week: 0.0 standard drinks  . Drug use: No  . Sexual activity: Not on file  Other Topics Concern  . 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 Difficulty of Paying Living Expenses: Not hard at all  Food Insecurity: No Food Insecurity  . Worried About Charity fundraiser in the Last Year: Never true  .  Ran Out of Food in the Last Year: Never true  Transportation Needs: No Transportation Needs  . Lack of Transportation (Medical): No  . Lack of Transportation (Non-Medical): No  Physical Activity: Sufficiently Active  . Days of Exercise per Week: 7 days  . Minutes of Exercise per Session: 30 min  Stress: No Stress Concern Present  . Feeling of Stress : Not at all  Social Connections: Not on file    Tobacco Counseling Counseling given: Not Answered   Clinical Intake:  Pre-visit preparation completed: Yes  Pain : No/denies pain     Diabetes: No  How often do you need to have someone help you when you read instructions, pamphlets, or other written materials from your doctor or pharmacy?: 1 - Never What is the last grade level you completed in school?: 4 years of college  Diabetic: No Nutrition Risk Assessment:  Has the patient had any N/V/D within the last 2 months?  No  Does the patient have any non-healing wounds?  No  Has the patient had any unintentional weight loss or weight gain?  No   Diabetes:  Is the patient diabetic?  No  If diabetic, was a CBG obtained today?  N/A Did the patient bring in their glucometer from home?  N/A How often do you monitor your CBG's? N/A.   Financial Strains and Diabetes Management:  Are you having any financial strains  with the device, your supplies or your medication? N/A.  Does the patient want to be seen by Chronic Care Management for management of their diabetes?  N/A Would the patient like to be referred to a Nutritionist or for Diabetic Management?  N/A  Interpreter Needed?: No  Information entered by :: CJohnson, LPN   Activities of Daily Living In your present state of health, do you have any difficulty performing the following activities: 04/27/2021  Hearing? N  Vision? N  Difficulty concentrating or making decisions? Y  Comment some memory changes noted  Walking or climbing stairs? N  Dressing or bathing? N  Doing errands, shopping? N  Preparing Food and eating ? N  Using the Toilet? N  In the past six months, have you accidently leaked urine? Y  Comment takes medication  Do you have problems with loss of bowel control? N  Managing your Medications? N  Managing your Finances? N  Housekeeping or managing your Housekeeping? N  Some recent data might be hidden    Patient Care Team: Tower, Wynelle Fanny, MD as PCP - General Debbora Dus, [facility] as Pharmacist (Pharmacist)  Indicate any recent Medical Services you may have received from other than [facility] providers in the past year (date may be approximate).     Assessment:   This is a routine wellness examination for Shelaine.  Hearing/Vision screen  Hearing Screening   125Hz  250Hz  500Hz  1000Hz  2000Hz  3000Hz  4000Hz  6000Hz  8000Hz   Right ear:           Left ear:           Vision Screening Comments: Patient gets annual eye exams   Dietary issues and exercise activities discussed: Current Exercise Habits: Home exercise routine, Type of exercise: walking, Time (Minutes): 30, Frequency (Times/Week): 7, Weekly Exercise (Minutes/Week): 210, Intensity: Moderate, Exercise limited by: None identified  Goals Addressed            This Visit's Progress   . Patient Stated       04/27/2021, I will continue to walk daily and work in my  garden.       Depression Screen PHQ 2/9 Scores 04/27/2021 04/26/2020 04/14/2019 04/08/2018 04/04/2017 04/03/2016 03/02/2013  PHQ - 2 Score 0 0 0 0 0 0 0  PHQ- 9 Score 0 0 0 0 - - -    Fall Risk Fall Risk  04/27/2021 04/26/2020 04/14/2019 04/08/2018 04/04/2017  Falls in the past year? 0 0 0 No No  Number falls in past yr: 0 0 - - -  Injury with Fall? 0 0 - - -  Risk for fall due to : Medication side effect Medication side effect - - -  Follow up Falls evaluation completed;Falls prevention discussed Falls evaluation completed;Falls prevention discussed - - -    FALL RISK PREVENTION PERTAINING TO THE HOME:  Any stairs in or around the home? Yes  If so, are there any without handrails? No  Home free of loose throw rugs in walkways, pet beds, electrical cords, etc? Yes  Adequate lighting in your home to reduce risk of falls? Yes   ASSISTIVE DEVICES UTILIZED TO PREVENT FALLS:  Life alert? No  Use of a cane, walker or w/c? No  Grab bars in the bathroom? No  Shower chair or bench in shower? No  Elevated toilet seat or a handicapped toilet? No   TIMED UP AND GO:  Was the test performed? N/A telephone visit.    Cognitive Function: MMSE - Mini Mental State Exam 04/27/2021 04/26/2020 04/14/2019 04/08/2018 04/04/2017  Orientation to time 5 5 5 5 5   Orientation to Place 5 5 5 5 5   Registration 3 3 3 3 3   Attention/ Calculation 5 5 0 0 0  Recall 3 3 3 3 3   Language- name 2 objects - - 0 0 0  Language- repeat 1 1 1 1 1   Language- follow 3 step command - - 0 3 3  Language- read & follow direction - - 0 0 0  Write a sentence - - 0 0 0  Copy design - - 0 0 0  Total score - - 17 20 20   Mini Cog  Mini-Cog screen was completed. Maximum score is 22. A value of 0 denotes this part of the MMSE was not completed or the patient failed this part of the Mini-Cog screening.       Immunizations Immunization History  Administered Date(s) Administered  . Influenza Split 08/10/2011  . Influenza Whole 10/10/2007, 08/17/2008,  08/31/2009, 08/09/2010  . Influenza, High Dose Seasonal PF 07/18/2017, 07/09/2018, 08/06/2019, 08/16/2020, 08/16/2020, 08/17/2020  . Influenza,inj,Quad PF,6+ Mos 08/04/2015, 09/02/2016  . Influenza-Unspecified 08/09/2014  . PFIZER Comirnaty(Gray Top)Covid-19 Tri-Sucrose Vaccine 03/09/2021  . PFIZER(Purple Top)SARS-COV-2 Vaccination 01/17/2020, 02/09/2020, 09/07/2020  . Pneumococcal Conjugate-13 11/28/2015  . Pneumococcal Polysaccharide-23 08/25/2003, 10/10/2007  . Pneumococcal-Unspecified 11/27/2016  . Td 10/21/2006  . Tdap 03/20/2015  . Zoster 03/25/2012  . Zoster Recombinat (Shingrix) 09/04/2018, 12/09/2018    TDAP status: Up to date  Flu Vaccine status: Up to date  Pneumococcal vaccine status: Up to date  Covid-19 vaccine status: Completed vaccines  Qualifies for Shingles Vaccine? Yes   Zostavax completed Yes   Shingrix Completed?: Yes  Screening Tests Health Maintenance  Topic Date Due  . INFLUENZA VACCINE  07/09/2021  . MAMMOGRAM  04/04/2022  . TETANUS/TDAP  03/19/2025  . COLONOSCOPY (Pts 45-8yrs Insurance coverage will need to be confirmed)  05/19/2028  . DEXA SCAN  Completed  . COVID-19 Vaccine  Completed  . Hepatitis C Screening  Completed  . PNA vac Low Risk Adult  Completed  .  HPV VACCINES  Aged Out    Health Maintenance  There are no preventive care reminders to display for this patient.  Colorectal cancer screening: Type of screening: Colonoscopy. Completed 05/19/2018. Repeat every 10 years  Mammogram status: Completed 04/04/2021. Repeat every year  Bone Density status: Completed 05/25/2018. Results reflect: Bone density results: NORMAL. Repeat every 2-5 years.  Lung Cancer Screening: (Low Dose CT Chest recommended if Age 52-80 years, 30 pack-year currently smoking OR have quit w/in 15 years) does not qualify.  Additional Screening:  Hepatitis C Screening: does qualify; Completed 04/06/2007  Vision Screening: Recommended annual ophthalmology exams for  early detection of glaucoma and other disorders of the eye. Is the patient up to date with their annual eye exam?  Yes  Who is the provider or what is the name of the office in which the patient attends annual eye exams? [facility], Dr. Luretha Rued If pt is not established with a provider, would they like to be referred to a provider to establish care? No .   Dental Screening: Recommended annual dental exams for proper oral hygiene  Community Resource Referral / Chronic Care Management: CRR required this visit?  No   CCM required this visit?  No      Plan:     I have personally reviewed and noted the following in the patient's chart:   . Medical and social history . Use of alcohol, tobacco or illicit drugs  . Current medications and supplements including opioid prescriptions.  . Functional ability and status . Nutritional status . Physical activity . Advanced directives . List of other physicians . Hospitalizations, surgeries, and ER visits in previous 12 months . Vitals . Screenings to include cognitive, depression, and falls . Referrals and appointments  In addition, I have reviewed and discussed with patient certain preventive protocols, quality metrics, and best practice recommendations. A written personalized care plan for preventive services as well as general preventive health recommendations were provided to patient.   Due to this being a telephonic visit, the after visit summary with patients personalized plan was offered to patient via office or my-chart.  Patient preferred to pick up at office at next visit or via mychart.   Andrez Grime, LPN   [id number]

## 2021-05-01 DIAGNOSIS — H25013 Cortical age-related cataract, bilateral: Secondary | ICD-10-CM | POA: Diagnosis not present

## 2021-05-02 ENCOUNTER — Encounter: Payer: Self-pay | Admitting: Family Medicine

## 2021-05-02 ENCOUNTER — Other Ambulatory Visit: Payer: Self-pay

## 2021-05-02 ENCOUNTER — Ambulatory Visit (INDEPENDENT_AMBULATORY_CARE_PROVIDER_SITE_OTHER): Payer: Medicare Other | Admitting: Family Medicine

## 2021-05-02 VITALS — BP 133/81 | HR 78 | Temp 97.3°F | Ht 65.25 in | Wt 183.5 lb

## 2021-05-02 DIAGNOSIS — R8290 Unspecified abnormal findings in urine: Secondary | ICD-10-CM | POA: Diagnosis not present

## 2021-05-02 DIAGNOSIS — Z0000 Encounter for general adult medical examination without abnormal findings: Secondary | ICD-10-CM

## 2021-05-02 DIAGNOSIS — D75839 Thrombocytosis, unspecified: Secondary | ICD-10-CM | POA: Diagnosis not present

## 2021-05-02 DIAGNOSIS — R7303 Prediabetes: Secondary | ICD-10-CM

## 2021-05-02 DIAGNOSIS — E781 Pure hyperglyceridemia: Secondary | ICD-10-CM | POA: Diagnosis not present

## 2021-05-02 DIAGNOSIS — E039 Hypothyroidism, unspecified: Secondary | ICD-10-CM

## 2021-05-02 DIAGNOSIS — I10 Essential (primary) hypertension: Secondary | ICD-10-CM | POA: Diagnosis not present

## 2021-05-02 DIAGNOSIS — N3946 Mixed incontinence: Secondary | ICD-10-CM

## 2021-05-02 DIAGNOSIS — N289 Disorder of kidney and ureter, unspecified: Secondary | ICD-10-CM | POA: Diagnosis not present

## 2021-05-02 LAB — POC URINALSYSI DIPSTICK (AUTOMATED)
Bilirubin, UA: NEGATIVE
Blood, UA: 100
Glucose, UA: NEGATIVE
Nitrite, UA: NEGATIVE
Protein, UA: POSITIVE — AB
Spec Grav, UA: 1.02 (ref 1.010–1.025)
Urobilinogen, UA: 0.2 E.U./dL
pH, UA: 6 (ref 5.0–8.0)

## 2021-05-02 MED ORDER — PROPRANOLOL HCL ER 120 MG PO CP24: 120.0000 mg | ORAL_CAPSULE | Freq: Every day | ORAL | 3 refills | Status: DC

## 2021-05-02 MED ORDER — MONTELUKAST SODIUM 10 MG PO TABS: 1.0000 | ORAL_TABLET | Freq: Every day | ORAL | 3 refills | Status: DC

## 2021-05-02 MED ORDER — LEVOTHYROXINE SODIUM 100 MCG PO TABS: 100.0000 ug | ORAL_TABLET | Freq: Every day | ORAL | 3 refills | Status: DC

## 2021-05-02 MED ORDER — LIOTHYRONINE SODIUM 25 MCG PO TABS: ORAL_TABLET | ORAL | 3 refills | Status: DC

## 2021-05-02 NOTE — Progress Notes (Signed)
Subjective:    Patient ID: [name], female    DOB: 1951/02/02, 70 y.o.   MRN: [id number]  This visit occurred during the SARS-CoV-2 public health emergency.  Safety protocols were in place, including screening questions prior to the visit, additional usage of staff PPE, and extensive cleaning of exam room while observing appropriate contact time as indicated for disinfecting solutions.    HPI Here for health maintenance exam and to review chronic medical problems   Wt Readings from Last 3 Encounters:  05/02/21 183 lb 8 oz (83.2 kg)  01/30/21 182 lb 7 oz (82.8 kg)  05/01/20 178 lb 5 oz (80.9 kg)   30.30 kg/m   Doing well   She has been taking care of herself  walkig for exercise   Had amw on 5/20, reviewed  Mammogram 4/22 Self breast exam - no lumps   Colon cancer screening  Colonoscopy 6/19   dexa-normal in 6/19  Falls-none fx-none  Supplements -ca and vit D  Exercise -walking   Imms:  utd  covid imm with booster  Had shingrix vaccine    HTN bp is stable today  No cp or palpitations or headaches or edema  No side effects to medicines  BP Readings from Last 3 Encounters:  05/02/21 133/81  04/27/21 136/83  01/30/21 132/88    Propranolol ER 120 mg daily  Pulse Readings from Last 3 Encounters:  05/02/21 78  04/27/21 90  01/30/21 (!) 107     Hypothyroidism  Pt has no clinical changes No change in energy level/ hair or skin/ edema and no tremor Energy is down (gets more tired with hyper)  Lab Results  Component Value Date   TSH 0.19 (L) 04/25/2021     Taking cytomel 25 mg 1/2 pill daily Levothyroxine 125 mcg daily Taking it away from foods and supplements    Renal insuff Lab Results  Component Value Date   CREATININE 1.49 (H) 04/25/2021   BUN 32 (H) 04/25/2021   NA 139 04/25/2021   K 5.1 04/25/2021   CL 104 04/25/2021   CO2 24 04/25/2021   GFR is 35.3 She drinks lots of water-is mindful Goes to the urologist No urinary  symptoms  Takes mybertiq No nsaids   Does not get freq uti   H/o thrombocytosis  Lab Results  Component Value Date   WBC 7.6 04/25/2021   HGB 12.8 04/25/2021   HCT 39.3 04/25/2021   MCV 88.5 04/25/2021   PLT 499.0 (H) 04/25/2021  plt down from 528 No bleeding or bruising   Hyperlipidemia /triglycerides Lab Results  Component Value Date   CHOL 208 (H) 04/25/2021   CHOL 160 06/01/2020   CHOL 182 04/26/2020   Lab Results  Component Value Date   HDL 74.80 04/25/2021   HDL 58.90 06/01/2020   HDL 69.20 04/26/2020   Lab Results  Component Value Date   LDLCALC 68 06/01/2020   LDLCALC 56 04/15/2019   LDLCALC 78 04/04/2017   Lab Results  Component Value Date   TRIG 262.0 (H) 04/25/2021   TRIG 163.0 (H) 06/01/2020   TRIG 212.0 (H) 04/26/2020   Lab Results  Component Value Date   CHOLHDL 3 04/25/2021   CHOLHDL 3 06/01/2020   CHOLHDL 3 04/26/2020   Lab Results  Component Value Date   LDLDIRECT 93.0 04/25/2021   LDLDIRECT 77.0 04/26/2020   LDLDIRECT 90.0 04/08/2018   Fenofibrate 54 mg daily  Is eating differently - since sense of taste and smell  are changed Craves sugar   Prediabetes Lab Results  Component Value Date   HGBA1C 5.8 04/25/2021    Patient Active Problem List   Diagnosis Date Noted  . Hemorrhoids 10/23/2018  . Renal insufficiency 10/23/2018  . 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 Elevated serum creatinine 04/19/2018  . Estrogen deficiency 04/15/2018  . Bladder prolapse, female, 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  . Dyspepsia 04/14/2017  . Colon cancer screening 04/09/2017  . Prediabetes 03/30/2017  . Medicare annual wellness visit, initial 04/04/2016  . Other screening mammogram 12/31/2011  . Post-menopausal 12/31/2011  . Routine general medical examination at a health care facility 12/23/2011  . 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  . Hypothyroidism 11/08/2008  . HSV (herpes simplex virus) infection 09/23/2007  . Hypertriglyceridemia  09/23/2007  . Essential hypertension 09/23/2007  . ALLERGIC RHINITIS 09/23/2007  . ASTHMA 09/23/2007  . HYPOKALEMIA 04/16/2007  . Thrombocytosis ([place]) 04/09/2007   Past Medical History:  Diagnosis Date  . Allergic rhinitis   . Allergy   . Anemia    b12 def.  . Asthma    controlled w/ singulair  . Herpes simplex without mention of complication    controlled w/ meds (no current fever blisters)  . History of kidney stones   . HLD (hyperlipidemia)    borderline-  no meds  . HTN (hypertension)    echo- 2006  . Hyperthyroidism hx ptu 2006   takes synthroid; had Graves disease, was treated for that- now thyroid doesn't work  . Leukocytosis, unspecified hx of 10 yrs ago   no problem since  . Migraine   . Mixed incontinence    urge and stress incontinence  . Neuromuscular disorder ([place])    hiatal hernia  . Renal cyst right    pt is unsure of this, has had kidney stones  . Ureteral calculi right    s/p laser  litho w/ stone extraction 01-28-11   Past Surgical History:  Procedure Laterality Date  . BIOPSY  10/16/2018   Procedure: BIOPSY;  Surgeon: Milus Banister, MD;  Location: [facility] ENDOSCOPY;  Service: Endoscopy;;  . [place]  . CERVICAL FUSION  2000   fusion c4-6  . CERVICAL FUSION  2005   fusion c6-7 and plate removal A8-3  . COLONOSCOPY    . CYSTOSCOPY W/ RETROGRADES  10/21/2011   Procedure: CYSTOSCOPY WITH RETROGRADE PYELOGRAM;  Surgeon: Ailene Rud, MD;  Location: [facility];  Service: Urology;  Laterality: Right;  CYSTOSCOPY RIGHT RETROGRADE, PYELOGRAM  URETEROSCOPY WITH HOLMIUM LASER AND STONE EXTRACTION  . CYSTOSCOPY/RETROGRADE/URETEROSCOPY/STONE EXTRACTION WITH BASKET  01-28-11   right   . ESOPHAGOGASTRODUODENOSCOPY (EGD) WITH PROPOFOL N/A 10/16/2018   Procedure: ESOPHAGOGASTRODUODENOSCOPY (EGD) WITH PROPOFOL;  Surgeon: Milus Banister, MD;  Location: [facility] ENDOSCOPY;  Service: Endoscopy;  Laterality: N/A;  . REDUCTION  MAMMAPLASTY Bilateral 1995  . TONSILLECTOMY  1960  . VAGINAL HYSTERECTOMY  1987   fibroids/anemia   Social History   Tobacco Use  . Smoking status: Never Smoker  . Smokeless tobacco: Never Used  Vaping Use  . Vaping Use: Never used  Substance Use Topics  . Alcohol use: No    Alcohol/week: 0.0 standard drinks  . Drug use: No   Family History  Problem Relation Age of Onset  . Heart failure Father   . Osteoporosis Mother   . Diabetes Mother   . Diabetes Other        Grandfather  . Coronary artery disease Other  Grandmother  . Breast cancer Neg Hx   . Colon cancer Neg Hx   . Esophageal cancer Neg Hx   . Stomach cancer Neg Hx   . Rectal cancer Neg Hx    Allergies  Allergen Reactions  . Contrast Media [Iodinated Diagnostic Agents] Anaphylaxis  . Shellfish-Derived Products Anaphylaxis  . Septra [Sulfamethoxazole-Trimethoprim]     Nausea    Current Outpatient Medications on File Prior to Visit  Medication Sig Dispense Refill  . acyclovir (ZOVIRAX) 400 MG tablet TAKE 1 TABLET BY MOUTH  DAILY 90 tablet 1  . calcium-vitamin D (OSCAL WITH D) 500-200 MG-UNIT per tablet Take 2 tablets by mouth daily.    . fenofibrate 54 MG tablet TAKE 1 TABLET BY MOUTH  DAILY 90 tablet 1  . Multiple Vitamin (MULTIVITAMIN) tablet Take 1 tablet by mouth daily.    Marland Kitchen MYRBETRIQ 50 MG TB24 tablet TAKE 1 TABLET BY MOUTH  DAILY 90 tablet 3  . SUMAtriptan (IMITREX) 100 MG tablet TAKE 1 TABLET FOR HEADACHE MAY REPEAT ONCE IN 2 HOURS IF NEEDED. MAXIMUM OF 2    TABLETS IN ONE DAY. (Patient taking differently: Take 100 mg by mouth as needed for migraine or headache. TAKE 100 mg TABLET FOR HEADACHE MAY REPEAT ONCE IN 2 HOURS IF NEEDED. MAXIMUM OF 200 mg    TABLETS IN ONE DAY.) 27 tablet 3  . vitamin B-12 (CYANOCOBALAMIN) 500 MCG tablet Take 500 mcg by mouth daily.     No current facility-administered medications on file prior to visit.    Review of Systems  Constitutional: Positive for fatigue. Negative  for activity change, appetite change, fever and unexpected weight change.  HENT: Negative for congestion, ear pain, rhinorrhea, sinus pressure and sore throat.   Eyes: Negative for pain, redness and visual disturbance.  Respiratory: Negative for cough, shortness of breath and wheezing.   Cardiovascular: Negative for chest pain and palpitations.  Gastrointestinal: Negative for abdominal pain, blood in stool, constipation and diarrhea.  Endocrine: Negative for polydipsia and polyuria.  Genitourinary: Negative for dysuria, frequency and urgency.  Musculoskeletal: Negative for arthralgias, back pain and myalgias.  Skin: Negative for pallor and rash.  Allergic/Immunologic: Negative for environmental allergies.  Neurological: Negative for dizziness, syncope and headaches.  Hematological: Negative for adenopathy. Does not bruise/bleed easily.  Psychiatric/Behavioral: Negative for decreased concentration and dysphoric mood. The patient is not nervous/anxious.        Objective:   Physical Exam Constitutional:      General: She is not in acute distress.    Appearance: Normal appearance. She is well-developed. She is obese. She is not ill-appearing or diaphoretic.  HENT:     Head: Normocephalic and atraumatic.     Right Ear: Tympanic membrane, ear canal and external ear normal.     Left Ear: Tympanic membrane, ear canal and external ear normal.     Nose: Nose normal. No congestion.     Mouth/Throat:     Mouth: Mucous membranes are moist.     Pharynx: Oropharynx is clear. No posterior oropharyngeal erythema.  Eyes:     General: No scleral icterus.    Extraocular Movements: Extraocular movements intact.     Conjunctiva/sclera: Conjunctivae normal.     Pupils: Pupils are equal, round, and reactive to light.  Neck:     Thyroid: No thyromegaly.     Vascular: No carotid bruit or JVD.  Cardiovascular:     Rate and Rhythm: Normal rate and regular rhythm.     Pulses: Normal  pulses.     Heart  sounds: Normal heart sounds. No gallop.   Pulmonary:     Effort: Pulmonary effort is normal. No respiratory distress.     Breath sounds: Normal breath sounds. No wheezing.     Comments: Good air exch Chest:     Chest wall: No tenderness.  Abdominal:     General: Bowel sounds are normal. There is no distension or abdominal bruit.     Palpations: Abdomen is soft. There is no mass.     Tenderness: There is no abdominal tenderness.     Hernia: No hernia is present.  Genitourinary:    Comments: Breast exam: No mass, nodules, thickening, tenderness, bulging, retraction, inflamation, nipple discharge or skin changes noted.  No axillary or clavicular LA.     Musculoskeletal:        General: No tenderness. Normal range of motion.     Cervical back: Normal range of motion and neck supple. No rigidity. No muscular tenderness.     Right lower leg: No edema.     Left lower leg: No edema.  Lymphadenopathy:     Cervical: No cervical adenopathy.  Skin:    General: Skin is warm and dry.     Coloration: Skin is not pale.     Findings: No erythema or rash.     Comments: Fair complexion  Solar lentigines diffusely   Neurological:     Mental Status: She is alert. Mental status is at baseline.     Cranial Nerves: No cranial nerve deficit.     Motor: No abnormal muscle tone.     Coordination: Coordination normal.     Gait: Gait normal.     Deep Tendon Reflexes: Reflexes are normal and symmetric. Reflexes normal.  Psychiatric:        Mood and Affect: Mood normal.        Cognition and Memory: Cognition and memory normal.           Assessment & Plan:   Problem List Items Addressed This Visit      Cardiovascular and Mediastinum   Essential hypertension    bp in fair control at this time  BP Readings from Last 1 Encounters:  05/02/21 133/81   No changes needed Most recent labs reviewed  Disc lifstyle change with low sodium diet and exercise  Plan to continue propranolol ER 120 mg daily   Pulse of 78       Relevant Medications   propranolol ER (INDERAL LA) 120 MG 24 hr capsule     Endocrine   Hypothyroidism    Some more sluggishness  No other clinical changes  Lab Results  Component Value Date   TSH 0.19 (L) 04/25/2021    Will decrease levothyroxine to 100 mcg daily - she takes this correctly  Continue cytomel 12.5 mg daily  Re check lab in 6 wk        Relevant Medications   liothyronine (CYTOMEL) 25 MCG tablet   propranolol ER (INDERAL LA) 120 MG 24 hr capsule   levothyroxine (SYNTHROID) 100 MCG tablet   Other Relevant Orders   TSH   T3, Free     Genitourinary   Renal insufficiency    GFR down to 35.3 despite good fluid intake and avoidance of nsaids  Also bp is stable  ua today   If no uti would consider nephrology ref      Relevant Orders   POCT Urinalysis Dipstick (Automated)     Hematopoietic  and Hemostatic   Thrombocytosis (HCC)    Platelet ct 499 No bleeding/bruising/clots or clinical changes  Cannot donate due to issues with bp when she has in the past  Continue to monitor        Other   Hypertriglyceridemia    Disc goals for lipids and reasons to control them Rev last labs with pt Rev low sat fat diet in detail  Triglycerides are up at 262  Eating is not as good - more sweets lately  Can cut back on that  Will re check 6 weeks  Would consider inc fenofibrate if no imp      Relevant Medications   propranolol ER (INDERAL LA) 120 MG 24 hr capsule   Other Relevant Orders   Lipid panel   MIXED INCONTINENCE URGE AND STRESS    Has seen urology  mybetriq helps  ua today (in light of low GFR)  Will cx if positive      Relevant Orders   POCT Urinalysis Dipstick (Automated)   Routine general medical examination at a health care facility - Primary    Reviewed health habits including diet and exercise and skin cancer prevention Reviewed appropriate screening tests for age  Also reviewed health mt list, fam hx and  immunization status , as well as social and family history   See HPI Labs reviewed  Mammogram utd Colonoscopy utd dexa nl in 2019   No falls or fx  imms utd incl covid       Prediabetes    Lab Results  Component Value Date   HGBA1C 5.8 04/25/2021   disc imp of low glycemic diet and wt loss to prevent DM2  She will start eating more fruit in place of sweets

## 2021-05-02 NOTE — Patient Instructions (Addendum)
Change the levothyroxine to 100 mcg daily  We will re check that in about 6 weeks   When possible , fruit instead of sweets  Avoid fatty foods   Take care of yourself !   Let's check a urinalysis today  Keep drinking water

## 2021-05-02 NOTE — Assessment & Plan Note (Signed)
Lab Results  Component Value Date   HGBA1C 5.8 04/25/2021   disc imp of low glycemic diet and wt loss to prevent DM2  She will start eating more fruit in place of sweets

## 2021-05-02 NOTE — Assessment & Plan Note (Signed)
bp in fair control at this time  BP Readings from Last 1 Encounters:  05/02/21 133/81   No changes needed Most recent labs reviewed  Disc lifstyle change with low sodium diet and exercise  Plan to continue propranolol ER 120 mg daily  Pulse of 78

## 2021-05-02 NOTE — Assessment & Plan Note (Signed)
Has seen urology  mybetriq helps  ua today (in light of low GFR)  Will cx if positive

## 2021-05-02 NOTE — Assessment & Plan Note (Signed)
Platelet ct 499 No bleeding/bruising/clots or clinical changes  Cannot donate due to issues with bp when she has in the past  Continue to monitor

## 2021-05-02 NOTE — Assessment & Plan Note (Signed)
Reviewed health habits including diet and exercise and skin cancer prevention Reviewed appropriate screening tests for age  Also reviewed health mt list, fam hx and immunization status , as well as social and family history   See HPI Labs reviewed  Mammogram utd Colonoscopy utd dexa nl in 2019   No falls or fx  imms utd incl covid

## 2021-05-02 NOTE — Assessment & Plan Note (Addendum)
Disc goals for lipids and reasons to control them Rev last labs with pt Rev low sat fat diet in detail  Triglycerides are up at 262  Eating is not as good - more sweets lately  Can cut back on that  Will re check 6 weeks  Would consider inc fenofibrate if no imp

## 2021-05-02 NOTE — Assessment & Plan Note (Signed)
Some more sluggishness  No other clinical changes  Lab Results  Component Value Date   TSH 0.19 (L) 04/25/2021    Will decrease levothyroxine to 100 mcg daily - she takes this correctly  Continue cytomel 12.5 mg daily  Re check lab in 6 wk

## 2021-05-02 NOTE — Assessment & Plan Note (Signed)
GFR down to 35.3 despite good fluid intake and avoidance of nsaids  Also bp is stable  ua today   If no uti would consider nephrology ref

## 2021-05-03 LAB — URINE CULTURE
MICRO NUMBER:: 11933753
SPECIMEN QUALITY:: ADEQUATE

## 2021-05-07 ENCOUNTER — Telehealth: Payer: Self-pay | Admitting: Family Medicine

## 2021-05-07 DIAGNOSIS — N289 Disorder of kidney and ureter, unspecified: Secondary | ICD-10-CM

## 2021-05-07 NOTE — Telephone Encounter (Signed)
-----   Message from Babs Bertin, [place] sent at 05/04/2021  1:41 PM EDT ----- Patient was informed of results.  Patient understood and had no questions, comments, or concerns at this time. Patient is ok with referral and prefers [place].

## 2021-05-08 ENCOUNTER — Telehealth: Payer: Self-pay | Admitting: *Deleted

## 2021-05-08 NOTE — Telephone Encounter (Signed)
Patient called stating that she was seen by Dr. Glori Bickers on 05/02/21 and her thyroid medication was increased to 100 mcg.. Patient stated that the script was suppose to be sent to [facility] and they have not received it yet. Patient was advised that the script was sent to [facility]. Patient stated that she will wait and get the script from [facility]. Patient stated that her dose has been changed so much recently she thought it was going to be sent to [facility]. Patient stated that she has some of the 100 mcg left over from a previous script and will continue to take what she has. Patient stated if she runs out before she gets the mail order script she will call the office back.

## 2021-05-28 ENCOUNTER — Other Ambulatory Visit: Payer: Self-pay | Admitting: Family Medicine

## 2021-05-30 ENCOUNTER — Other Ambulatory Visit: Payer: Self-pay

## 2021-05-30 ENCOUNTER — Other Ambulatory Visit (INDEPENDENT_AMBULATORY_CARE_PROVIDER_SITE_OTHER): Payer: Medicare Other

## 2021-05-30 DIAGNOSIS — E781 Pure hyperglyceridemia: Secondary | ICD-10-CM

## 2021-05-30 DIAGNOSIS — E039 Hypothyroidism, unspecified: Secondary | ICD-10-CM | POA: Diagnosis not present

## 2021-05-30 LAB — LIPID PANEL
Cholesterol: 176 mg/dL (ref 0–200)
HDL: 67.8 mg/dL (ref >39.00)
NonHDL: 107.88
Total CHOL/HDL Ratio: 3
Triglycerides: 283 mg/dL — ABNORMAL HIGH (ref 0.0–149.0)
VLDL: 56.6 mg/dL — ABNORMAL HIGH (ref 0.0–40.0)

## 2021-05-30 LAB — T3, FREE: T3, Free: 3.7 pg/mL (ref 2.3–4.2)

## 2021-05-30 LAB — TSH: TSH: 0.09 u[IU]/mL — ABNORMAL LOW (ref 0.35–4.50)

## 2021-05-30 LAB — LDL CHOLESTEROL, DIRECT: Direct LDL: 77 mg/dL

## 2021-06-06 ENCOUNTER — Encounter: Payer: Self-pay | Admitting: Family Medicine

## 2021-06-06 NOTE — Telephone Encounter (Signed)
Please advise.  No available appointments Thurs or Friday.

## 2021-06-07 ENCOUNTER — Telehealth (INDEPENDENT_AMBULATORY_CARE_PROVIDER_SITE_OTHER): Payer: Medicare Other | Admitting: Family Medicine

## 2021-06-07 ENCOUNTER — Telehealth: Payer: Self-pay | Admitting: Family Medicine

## 2021-06-07 DIAGNOSIS — R0981 Nasal congestion: Secondary | ICD-10-CM | POA: Diagnosis not present

## 2021-06-07 DIAGNOSIS — E781 Pure hyperglyceridemia: Secondary | ICD-10-CM

## 2021-06-07 DIAGNOSIS — E039 Hypothyroidism, unspecified: Secondary | ICD-10-CM

## 2021-06-07 MED ORDER — FENOFIBRATE 54 MG PO TABS: 54.0000 mg | ORAL_TABLET | Freq: Every day | ORAL | 3 refills | Status: DC

## 2021-06-07 MED ORDER — DOXYCYCLINE HYCLATE 100 MG PO TABS: 100.0000 mg | ORAL_TABLET | Freq: Two times a day (BID) | ORAL | 0 refills | Status: DC

## 2021-06-07 NOTE — Progress Notes (Signed)
Virtual Visit via Video Note  I connected with [name]  on 06/07/21 at  1:00 PM EDT by a video enabled telemedicine application and verified that I am speaking with the correct person using two identifiers.  Location patient: home, [place] Location provider:work or home office Persons participating in the virtual visit: patient, provider, patient's husband  I discussed the limitations of evaluation and management by telemedicine and the availability of in person appointments. The patient expressed understanding and agreed to proceed.   HPI:  Acute telemedicine visit for sinus issues: -Onset: chronic sinus issues with allergies, but worse for the last seven days  -no fever when checks it -Symptoms include:sinus congestion, thick now, R sided max sinus discomfort, cough, sweats at times -Denies:CP, SOB, NVD, body aches, inability to eat/drink/get out of bed -Pertinent past medical history: hx of allergies and sinusitis, has had sinus surgery in the past and has been on allergy shots in the past -denies recent abx use -Pertinent medication allergies:  Allergies  Allergen Reactions   Contrast Media [Iodinated Diagnostic Agents] Anaphylaxis   Shellfish-Derived Products Anaphylaxis   Septra [Sulfamethoxazole-Trimethoprim]     Nausea   -COVID-19 vaccine status: vaccinated for covid x2 + 2 boosters  ROS: See pertinent positives and negatives per HPI.  Past Medical History:  Diagnosis Date   Allergic rhinitis    Allergy    Anemia    b12 def.   Asthma    controlled w/ singulair   Herpes simplex without mention of complication    controlled w/ meds (no current fever blisters)   History of kidney stones    HLD (hyperlipidemia)    borderline-  no meds   HTN (hypertension)    echo- 2006   Hyperthyroidism hx ptu 2006   takes synthroid; had Graves disease, was treated for that- now thyroid doesn't work   Leukocytosis, unspecified hx of 10 yrs ago   no problem since   Migraine    Mixed  incontinence    urge and stress incontinence   Neuromuscular disorder ([place])    hiatal hernia   Renal cyst right    pt is unsure of this, has had kidney stones   Ureteral calculi right    s/p laser  litho w/ stone extraction 01-28-11    Past Surgical History:  Procedure Laterality Date   BIOPSY  10/16/2018   Procedure: BIOPSY;  Surgeon: Milus Banister, MD;  Location: [facility];  Service: Endoscopy;;   [place]   fusion c4-6   CERVICAL FUSION  2005   fusion c6-7 and plate removal H4-7   COLONOSCOPY     CYSTOSCOPY W/ RETROGRADES  10/21/2011   Procedure: CYSTOSCOPY WITH RETROGRADE PYELOGRAM;  Surgeon: Ailene Rud, MD;  Location: [facility];  Service: Urology;  Laterality: Right;  CYSTOSCOPY RIGHT RETROGRADE, PYELOGRAM  URETEROSCOPY WITH HOLMIUM LASER AND STONE EXTRACTION   CYSTOSCOPY/RETROGRADE/URETEROSCOPY/STONE EXTRACTION WITH BASKET  01-28-11   right    ESOPHAGOGASTRODUODENOSCOPY (EGD) WITH PROPOFOL N/A 10/16/2018   Procedure: ESOPHAGOGASTRODUODENOSCOPY (EGD) WITH PROPOFOL;  Surgeon: Milus Banister, MD;  Location: [facility] ENDOSCOPY;  Service: Endoscopy;  Laterality: N/A;   REDUCTION MAMMAPLASTY Bilateral 1995   TONSILLECTOMY  1960   VAGINAL HYSTERECTOMY  1987   fibroids/anemia     Current Outpatient Medications:    doxycycline (VIBRA-TABS) 100 MG tablet, Take 1 tablet (100 mg total) by mouth 2 (two) times daily., Disp: 20 tablet, Rfl: 0   acyclovir (  ZOVIRAX) 400 MG tablet, TAKE 1 TABLET BY MOUTH  DAILY, Disp: 90 tablet, Rfl: 1   calcium-vitamin D (OSCAL WITH D) 500-200 MG-UNIT per tablet, Take 2 tablets by mouth daily., Disp: , Rfl:    fenofibrate 54 MG tablet, TAKE 1 TABLET BY MOUTH  DAILY, Disp: 90 tablet, Rfl: 1   levothyroxine (SYNTHROID) 100 MCG tablet, Take 1 tablet (100 mcg total) by mouth daily., Disp: 90 tablet, Rfl: 3   liothyronine (CYTOMEL) 25 MCG tablet, TAKE ONE-HALF TABLET BY  MOUTH EVERY  EVENING, Disp: 45 tablet, Rfl: 3   montelukast (SINGULAIR) 10 MG tablet, Take 1 tablet (10 mg total) by mouth daily., Disp: 90 tablet, Rfl: 3   Multiple Vitamin (MULTIVITAMIN) tablet, Take 1 tablet by mouth daily., Disp: , Rfl:    MYRBETRIQ 50 MG TB24 tablet, TAKE 1 TABLET BY MOUTH  DAILY, Disp: 90 tablet, Rfl: 3   propranolol ER (INDERAL LA) 120 MG 24 hr capsule, Take 1 capsule (120 mg total) by mouth daily., Disp: 90 capsule, Rfl: 3   SUMAtriptan (IMITREX) 100 MG tablet, TAKE 1 TABLET FOR HEADACHE MAY REPEAT ONCE IN 2 HOURS IF NEEDED. MAXIMUM OF 2    TABLETS IN ONE DAY. (Patient taking differently: Take 100 mg by mouth as needed for migraine or headache. TAKE 100 mg TABLET FOR HEADACHE MAY REPEAT ONCE IN 2 HOURS IF NEEDED. MAXIMUM OF 200 mg    TABLETS IN ONE DAY.), Disp: 27 tablet, Rfl: 3   vitamin B-12 (CYANOCOBALAMIN) 500 MCG tablet, Take 500 mcg by mouth daily., Disp: , Rfl:   EXAM:  VITALS per patient if applicable:  GENERAL: alert, oriented, appears well and in no acute distress  HEENT: atraumatic, conjunttiva clear, no obvious abnormalities on inspection of external nose and ears  NECK: normal movements of the head and neck  LUNGS: on inspection no signs of respiratory distress, breathing rate appears normal, no obvious gross SOB, gasping or wheezing  CV: no obvious cyanosis  MS: moves all visible extremities without noticeable abnormality  PSYCH/NEURO: pleasant and cooperative, no obvious depression or anxiety, speech and thought processing grossly intact  ASSESSMENT AND PLAN:  Discussed the following assessment and plan:  Nasal sinus congestion  -we discussed possible serious and likely etiologies, options for evaluation and workup, limitations of telemedicine visit vs in person visit, treatment, treatment risks and precautions. Pt prefers to treat via telemedicine empirically rather than in person at this moment. Query sinusitis, VURI, COVID19 vs other. She opted for home  covid testing and starting doxy 100mg  bid for 7-10 days along with nasal saline.   Advised to seek prompt in person care if worsening, new symptoms arise, or if is not improving with treatment. Discussed options for inperson care if PCP office not available. Did let this patient know that I only do telemedicine on Tuesdays and Thursdays for [place]. Advised to schedule follow up visit with PCP or [facility] if any further questions or concerns to avoid delays in care.   I discussed the assessment and treatment plan with the patient. The patient was provided an opportunity to ask questions and all were answered. The patient agreed with the plan and demonstrated an understanding of the instructions.     Lucretia Kern, DO

## 2021-06-07 NOTE — Telephone Encounter (Signed)
-----   Message from Carter Kitten, [place] sent at 06/07/2021  3:24 PM EDT ----- Ms. Clovis Riley notified as instructed by telephone.  She is agreeable to endo referral and starting fenofibrate.

## 2021-06-07 NOTE — Telephone Encounter (Signed)
I placed a referral to endocrinology  I sent fenofibrate to pharmacy Please schedule fasting lab for about 6 wk for lipid

## 2021-06-07 NOTE — Patient Instructions (Signed)
  HOME CARE TIPS:  -[place] testing information: https://www.[facility].org/ OR [phone] Most pharmacies also offer testing and home test kits. If the Covid19 test is positive, please make a prompt follow up visit with your primary care office or with [facility] to discuss treatment options. Treatments for Covid19 are best given early in the course of the illness.   -I sent the medication(s) we discussed to your pharmacy: Meds ordered this encounter  Medications   doxycycline (VIBRA-TABS) 100 MG tablet    Sig: Take 1 tablet (100 mg total) by mouth 2 (two) times daily.    Dispense:  20 tablet    Refill:  0      -can use nasal saline a few times per day if you have nasal congestion  -stay hydrated, drink plenty of fluids and eat small healthy meals - avoid dairy  -If the Covid test is positive, check out the [facility] website for more information on home care, transmission and treatment for [place]  -follow up with your doctor in 2-3 days unless improving and feeling better  It was nice to meet you today, and I really hope you are feeling better soon. I help [facility] out with telemedicine visits on Tuesdays and Thursdays and am available for visits on those days. If you have any concerns or questions following this visit please schedule a follow up visit with your Primary Care doctor or seek care at a local urgent care clinic to avoid delays in care.    Seek in person care or schedule a follow up video visit promptly if your symptoms worsen, new concerns arise or you are not improving with treatment. Call 911 and/or seek emergency care if your symptoms are severe or life threatening.

## 2021-06-08 NOTE — Telephone Encounter (Signed)
Patient called and informed that she needed to set up a 6 week fasting labs appointment. Patient stated that she wasn't sure if she was supposed to or not. She is scheduled for her lab appointment.

## 2021-06-24 ENCOUNTER — Encounter: Payer: Self-pay | Admitting: Family Medicine

## 2021-06-27 NOTE — Telephone Encounter (Signed)
Appt scheduled tomorrow with PCP at 12:30, pt said she would like to see another ENT practice if they can see her sooner, she would rather go somewhere in [place] but she's willing to go to [facility] if they can see her sooner. Pt has an appt scheduled with [facility] in [place] but like mycharts said it's not until 07/21/21

## 2021-06-28 ENCOUNTER — Other Ambulatory Visit: Payer: Self-pay

## 2021-06-28 ENCOUNTER — Encounter: Payer: Self-pay | Admitting: Family Medicine

## 2021-06-28 ENCOUNTER — Ambulatory Visit (INDEPENDENT_AMBULATORY_CARE_PROVIDER_SITE_OTHER): Payer: Medicare Other | Admitting: Family Medicine

## 2021-06-28 DIAGNOSIS — H6690 Otitis media, unspecified, unspecified ear: Secondary | ICD-10-CM | POA: Diagnosis not present

## 2021-06-28 MED ORDER — AMOXICILLIN-POT CLAVULANATE 875-125 MG PO TABS: 1.0000 | ORAL_TABLET | Freq: Two times a day (BID) | ORAL | 0 refills | Status: DC

## 2021-06-28 NOTE — Patient Instructions (Addendum)
I suspect you likely ruptured your eardrum but cannot tell 100% I placed an ENT referral   Take the augmentin as directed   Extra strength tylenol is ok 2 pills up to every 6 hours  If symptoms suddenly worsen alert [place] and go to the ER

## 2021-06-28 NOTE — Progress Notes (Signed)
Subjective:    Patient ID: [name], female    DOB: 08/05/1951, 70 y.o.   MRN: [id number]  This visit occurred during the SARS-CoV-2 public health emergency.  Safety protocols were in place, including screening questions prior to the visit, additional usage of staff PPE, and extensive cleaning of exam room while observing appropriate contact time as indicated for disinfecting solutions.   HPI Pt presents with c/o ear pain and d/c  Wt Readings from Last 3 Encounters:  06/28/21 173 lb (78.5 kg)  05/02/21 183 lb 8 oz (83.2 kg)  01/30/21 182 lb 7 oz (82.8 kg)   28.57 kg/m   Pt had visit with Dr Maudie Mercury on 6/30 and was px abx for presumed OM  (coxycycline) The symptoms then worsened and she started having drainage in the ear  She cannot hear well from that ear    Has appt with ENT aug 13th  Pain is dull all the time - temple to face to ear  Then sharp pains  Ear drains - yellow/pus looking -that has slowed  Has trouble hearing   At first had a fever-not now  At first had nasal symptoms but that cleared up    No dizziness  No appetite   No ear drops except wax removing drops occ (not recently)  Patient Active Problem List   Diagnosis Date Noted   Otitis media 06/28/2021   Hemorrhoids 10/23/2018   Renal insufficiency 10/23/2018   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    Elevated serum creatinine 04/19/2018   Estrogen deficiency 04/15/2018   Bladder prolapse, female, 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   Dyspepsia 04/14/2017   Colon cancer screening 04/09/2017   Prediabetes 03/30/2017   Medicare annual wellness visit, initial 04/04/2016   Other screening mammogram 12/31/2011   Post-menopausal 12/31/2011   Routine general medical examination at a health care facility 12/23/2011   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   Hypothyroidism 11/08/2008   HSV (herpes simplex virus) infection 09/23/2007   Hypertriglyceridemia 09/23/2007   Essential hypertension 09/23/2007    ALLERGIC RHINITIS 09/23/2007   ASTHMA 09/23/2007   HYPOKALEMIA 04/16/2007   Thrombocytosis ([place]) 04/09/2007   Past Medical History:  Diagnosis Date   Allergic rhinitis    Allergy    Anemia    b12 def.   Asthma    controlled w/ singulair   Herpes simplex without mention of complication    controlled w/ meds (no current fever blisters)   History of kidney stones    HLD (hyperlipidemia)    borderline-  no meds   HTN (hypertension)    echo- 2006   Hyperthyroidism hx ptu 2006   takes synthroid; had Graves disease, was treated for that- now thyroid doesn't work   Leukocytosis, unspecified hx of 10 yrs ago   no problem since   Migraine    Mixed incontinence    urge and stress incontinence   Neuromuscular disorder ([place])    hiatal hernia   Renal cyst right    pt is unsure of this, has had kidney stones   Ureteral calculi right    s/p laser  litho w/ stone extraction 01-28-11   Past Surgical History:  Procedure Laterality Date   BIOPSY  10/16/2018   Procedure: BIOPSY;  Surgeon: Milus Banister, MD;  Location: [place];  Service: Endoscopy;;   [place] Di[facility]az   fusion c4-6   CERVICAL FUSION  2005   fusion c6-7 and plate  removal c4-6   COLONOSCOPY     CYSTOSCOPY W/ RETROGRADES  10/21/2011   Procedure: CYSTOSCOPY WITH RETROGRADE PYELOGRAM;  Surgeon: Ailene Rud, MD;  Location[facility];  Service: Urology;  Laterality: Right;  CYSTOSCOPY RIGHT RETROGRADE, PYELOGRAM  URETEROSCOPY WITH HOLMIUM LASER AND STONE EXTRACTION   CYSTOSCOPY/RETROGRADE/URETEROSCOPY/STONE EXTRACTION WITH BASKET  01-28-11   right    ESOPHAGOGASTRODUODENOSCOPY (EGD) WITH PROPOFOL N/A 10/16/2018   Procedure: ESOPHAGOGASTRODUODENOSCOPY (EGD) WITH PROPOFOL;  Surgeon: Milus Banister, MD;  Location: [facility] ENDOSCOPY;  Service: Endoscopy;  Laterality: N/A;   REDUCTION MAMMAPLASTY Bilateral 1995   TONSILLECTOMY  1960   VAGINAL HYSTERECTOMY  1987    fibroids/anemia   Social History   Tobacco Use   Smoking status: Never   Smokeless tobacco: Never  Vaping Use   Vaping Use: Never used  Substance Use Topics   Alcohol use: No    Alcohol/week: 0.0 standard drinks   Drug use: No   Family History  Problem Relation Age of Onset   Heart failure Father    Osteoporosis Mother    Diabetes Mother    Diabetes Other        Grandfather   Coronary artery disease Other        Grandmother   Breast cancer Neg Hx    Colon cancer Neg Hx    Esophageal cancer Neg Hx    Stomach cancer Neg Hx    Rectal cancer Neg Hx    Allergies  Allergen Reactions   Contrast Media [Iodinated Diagnostic Agents] Anaphylaxis   Shellfish-Derived Products Anaphylaxis   Septra [Sulfamethoxazole-Trimethoprim]     Nausea    Current Outpatient Medications on File Prior to Visit  Medication Sig Dispense Refill   acyclovir (ZOVIRAX) 400 MG tablet TAKE 1 TABLET BY MOUTH  DAILY 90 tablet 1   calcium-vitamin D (OSCAL WITH D) 500-200 MG-UNIT per tablet Take 2 tablets by mouth daily.     fenofibrate 54 MG tablet Take 1 tablet (54 mg total) by mouth daily. 90 tablet 3   levothyroxine (SYNTHROID) 100 MCG tablet Take 1 tablet (100 mcg total) by mouth daily. 90 tablet 3   liothyronine (CYTOMEL) 25 MCG tablet TAKE ONE-HALF TABLET BY  MOUTH EVERY EVENING 45 tablet 3   montelukast (SINGULAIR) 10 MG tablet Take 1 tablet (10 mg total) by mouth daily. 90 tablet 3   Multiple Vitamin (MULTIVITAMIN) tablet Take 1 tablet by mouth daily.     MYRBETRIQ 50 MG TB24 tablet TAKE 1 TABLET BY MOUTH  DAILY 90 tablet 3   propranolol ER (INDERAL LA) 120 MG 24 hr capsule Take 1 capsule (120 mg total) by mouth daily. 90 capsule 3   SUMAtriptan (IMITREX) 100 MG tablet TAKE 1 TABLET FOR HEADACHE MAY REPEAT ONCE IN 2 HOURS IF NEEDED. MAXIMUM OF 2    TABLETS IN ONE DAY. (Patient taking differently: Take 100 mg by mouth as needed for migraine or headache. TAKE 100 mg TABLET FOR HEADACHE MAY REPEAT ONCE  IN 2 HOURS IF NEEDED. MAXIMUM OF 200 mg    TABLETS IN ONE DAY.) 27 tablet 3   vitamin B-12 (CYANOCOBALAMIN) 500 MCG tablet Take 500 mcg by mouth daily.     No current facility-administered medications on file prior to visit.    Review of Systems  Constitutional:  Negative for activity change, appetite change, fatigue, fever and unexpected weight change.  HENT:  Positive for ear discharge, ear pain and hearing loss. Negative for congestion, facial swelling, rhinorrhea, sinus pressure  and sore throat.   Eyes:  Negative for pain, redness and visual disturbance.  Respiratory:  Negative for cough, shortness of breath and wheezing.   Cardiovascular:  Negative for chest pain and palpitations.  Gastrointestinal:  Negative for abdominal pain, blood in stool, constipation and diarrhea.  Endocrine: Negative for polydipsia and polyuria.  Genitourinary:  Negative for dysuria, frequency and urgency.  Musculoskeletal:  Negative for arthralgias, back pain and myalgias.  Skin:  Negative for pallor and rash.  Allergic/Immunologic: Negative for environmental allergies.  Neurological:  Negative for dizziness, syncope and headaches.  Hematological:  Negative for adenopathy. Does not bruise/bleed easily.  Psychiatric/Behavioral:  Negative for decreased concentration and dysphoric mood. The patient is not nervous/anxious.       Objective:   Physical Exam Constitutional:      General: She is not in acute distress.    Appearance: Normal appearance. She is normal weight. She is not ill-appearing or diaphoretic.  HENT:     Head: Normocephalic and atraumatic.     Comments: No facial/sinus tenderness    Left Ear: Tympanic membrane normal. There is impacted cerumen.     Ears:     Comments: R ear-no redness or skin change  Mild discomfort with palp of tragus  Ear canal is filled with yellow mucous-unable to visualize TM     Nose: No congestion or rhinorrhea.     Mouth/Throat:     Mouth: Mucous membranes are  moist.     Pharynx: Oropharynx is clear.  Eyes:     General:        Right eye: No discharge.        Left eye: No discharge.     Conjunctiva/sclera: Conjunctivae normal.     Pupils: Pupils are equal, round, and reactive to light.  Cardiovascular:     Rate and Rhythm: Regular rhythm. Tachycardia present.  Pulmonary:     Effort: Pulmonary effort is normal. No respiratory distress.     Breath sounds: Normal breath sounds. No wheezing.  Musculoskeletal:     Cervical back: Normal range of motion and neck supple. No rigidity or tenderness.  Skin:    Findings: No erythema or rash.  Neurological:     Mental Status: She is alert.     Cranial Nerves: No cranial nerve deficit.  Psychiatric:        Mood and Affect: Mood normal.          Assessment & Plan:   Problem List Items Addressed This Visit       Nervous and Auditory   Otitis media    Upon history review/ virtual visit on 6/30 and exam today most likely had OM with ruptured TM  S/p tx with doxycycline and significant pain  Px augmentin today  Urgent ref to ENT as well  Disc pain control with tylenol (no nsaid due to renal fxn)  Update if not starting to improve in a week or if worsening         Relevant Medications   amoxicillin-clavulanate (AUGMENTIN) 875-125 MG tablet   Other Relevant Orders   Ambulatory referral to ENT

## 2021-06-28 NOTE — Assessment & Plan Note (Signed)
Upon history review/ virtual visit on 6/30 and exam today most likely had OM with ruptured TM  S/p tx with doxycycline and significant pain  Px augmentin today  Urgent ref to ENT as well  Disc pain control with tylenol (no nsaid due to renal fxn)  Update if not starting to improve in a week or if worsening

## 2021-07-04 DIAGNOSIS — H60331 Swimmer's ear, right ear: Secondary | ICD-10-CM | POA: Diagnosis not present

## 2021-07-04 DIAGNOSIS — H6123 Impacted cerumen, bilateral: Secondary | ICD-10-CM | POA: Diagnosis not present

## 2021-07-05 ENCOUNTER — Encounter: Payer: Self-pay | Admitting: Family Medicine

## 2021-07-09 NOTE — Telephone Encounter (Signed)
I drafted the letter. You should be able to see it in mychart  If you need to pick up a copy to take there, let me know

## 2021-07-12 NOTE — Telephone Encounter (Signed)
See mychart message. Pt would like a paper copy signed and picked up. Letter printed and placed in your inbox to sign

## 2021-07-16 DIAGNOSIS — H60331 Swimmer's ear, right ear: Secondary | ICD-10-CM | POA: Diagnosis not present

## 2021-07-17 ENCOUNTER — Telehealth: Payer: Self-pay

## 2021-07-17 NOTE — Chronic Care Management (AMB) (Addendum)
    Chronic Care Management Pharmacy Assistant   Name: [name]  MRN: [id number] DOB: Apr 26, 1951  Reason for Encounter: CCM Reminder Call   Conditions to be addressed/monitored: HTN and Hypertriglyceridemia   Medications: Outpatient Encounter Medications as of 07/17/2021  Medication Sig   acyclovir (ZOVIRAX) 400 MG tablet TAKE 1 TABLET BY MOUTH  DAILY   amoxicillin-clavulanate (AUGMENTIN) 875-125 MG tablet Take 1 tablet by mouth 2 (two) times daily.   calcium-vitamin D (OSCAL WITH D) 500-200 MG-UNIT per tablet Take 2 tablets by mouth daily.   fenofibrate 54 MG tablet Take 1 tablet (54 mg total) by mouth daily.   levothyroxine (SYNTHROID) 100 MCG tablet Take 1 tablet (100 mcg total) by mouth daily.   liothyronine (CYTOMEL) 25 MCG tablet TAKE ONE-HALF TABLET BY  MOUTH EVERY EVENING   montelukast (SINGULAIR) 10 MG tablet Take 1 tablet (10 mg total) by mouth daily.   Multiple Vitamin (MULTIVITAMIN) tablet Take 1 tablet by mouth daily.   MYRBETRIQ 50 MG TB24 tablet TAKE 1 TABLET BY MOUTH  DAILY   propranolol ER (INDERAL LA) 120 MG 24 hr capsule Take 1 capsule (120 mg total) by mouth daily.   SUMAtriptan (IMITREX) 100 MG tablet TAKE 1 TABLET FOR HEADACHE MAY REPEAT ONCE IN 2 HOURS IF NEEDED. MAXIMUM OF 2    TABLETS IN ONE DAY. (Patient taking differently: Take 100 mg by mouth as needed for migraine or headache. TAKE 100 mg TABLET FOR HEADACHE MAY REPEAT ONCE IN 2 HOURS IF NEEDED. MAXIMUM OF 200 mg    TABLETS IN ONE DAY.)   vitamin B-12 (CYANOCOBALAMIN) 500 MCG tablet Take 500 mcg by mouth daily.   No facility-administered encounter medications on file as of 07/17/2021.   [name] was contacted to remind her of her upcoming telephone visit with Debbora Dus on 07/23/2021 at 1:00pm. Patient was reminded to have all medications, supplements and any blood glucose and blood pressure readings available for review at appointment.   Are you having any problems with your medications? No  the patient reports most medications come mail order with [facility]  Do you have any concerns you like to discuss with the pharmacist? No  Star Rating Drugs: Medication:  Last Fill: Day Supply None identified Debbora Dus, CPP notified  Avel Sensor, [place] Assistant [phone]  I have reviewed the care management and care coordination activities outlined in this encounter and I am certifying that I agree with the content of this note. No further action required.  Debbora Dus, PharmD Clinical Pharmacist [facility] at [facility] [phone]

## 2021-07-19 ENCOUNTER — Telehealth: Payer: Medicare Other

## 2021-07-23 ENCOUNTER — Other Ambulatory Visit: Payer: Self-pay

## 2021-07-23 ENCOUNTER — Ambulatory Visit (INDEPENDENT_AMBULATORY_CARE_PROVIDER_SITE_OTHER): Payer: Medicare Other

## 2021-07-23 DIAGNOSIS — E781 Pure hyperglyceridemia: Secondary | ICD-10-CM

## 2021-07-23 DIAGNOSIS — I10 Essential (primary) hypertension: Secondary | ICD-10-CM

## 2021-07-23 DIAGNOSIS — E039 Hypothyroidism, unspecified: Secondary | ICD-10-CM

## 2021-07-23 NOTE — Progress Notes (Signed)
Chronic Care Management Pharmacy Note  07/23/2021 Name:  [name] MRN:  [id number] DOB:  [birth year]  Subjective: [name] is an 70 y.o. year old female who is a primary patient of Tower, Wynelle Fanny, MD.  The CCM team was consulted for assistance with disease management and care coordination needs.    Engaged with patient by telephone for follow up visit in response to provider referral for pharmacy case management and/or care coordination services.   Consent to Services:  The patient was given information about Chronic Care Management services, agreed to services, and gave verbal consent prior to initiation of services.  Please see initial visit note for detailed documentation.   Patient Care Team: Tower, Wynelle Fanny, MD as PCP - [name], [facility] as Pharmacist (Pharmacist)  Recent office visits: 05/31/21 - Labs - TG still elevated, Start fenofibrate. TSH low. Refer to endocrinology. 05/02/21 - Dr. Glori Bickers, PCP - Pt presented for AWV. TSH low. Decrease levothyroxine to 100 mcg daily. Triglycerides elevated, 262. More sweets lately, she will cut back. GFR down, check UA. Referral to nephrology for kidney function.  Recheck lab in 6 weeks.   Recent consult visits: None in previous 6 months  Hospital visits: None in previous 6 months   Objective:  Lab Results  Component Value Date   CREATININE 1.49 (H) 04/25/2021   BUN 32 (H) 04/25/2021   GFR 35.39 (L) 04/25/2021   GFRNONAA 33 (L) 10/17/2018   GFRAA 38 (L) 10/17/2018   NA 139 04/25/2021   K 5.1 04/25/2021   CALCIUM 10.1 04/25/2021   CO2 24 04/25/2021   GLUCOSE 107 (H) 04/25/2021   Lab Results  Component Value Date/Time   HGBA1C 5.8 04/25/2021 07:55 AM   HGBA1C 5.8 06/01/2020 08:27 AM   GFR 35.39 (L) 04/25/2021 07:55 AM   GFR 35.74 (L) 01/30/2021 12:28 PM    Lab Results  Component Value Date   CHOL 176 05/30/2021   HDL 67.80 05/30/2021   LDLCALC 68 06/01/2020   LDLDIRECT 77.0 05/30/2021   TRIG  283.0 (H) 05/30/2021   CHOLHDL 3 05/30/2021   Hepatic Function Latest Ref Rng & Units 04/25/2021 06/01/2020 04/26/2020  Total Protein 6.0 - 8.3 g/dL 7.3 6.7 7.4  Albumin 3.5 - 5.2 g/dL 4.6 4.1 4.4  AST 0 - 37 U/L 15 17 17   ALT 0 - 35 U/L 15 14 10   Alk Phosphatase 39 - 117 U/L 80 79 70  Total Bilirubin 0.2 - 1.2 mg/dL 0.5 0.4 0.5  Bilirubin, Direct 0.0 - 0.3 mg/dL - - -   Lab Results  Component Value Date/Time   TSH 0.09 (L) 05/30/2021 09:48 AM   TSH 0.19 (L) 04/25/2021 07:55 AM   FREET4 1.19 10/15/2018 12:46 PM   FREET4 1.20 10/14/2018 11:39 AM    CBC Latest Ref Rng & Units 04/25/2021 06/01/2020 04/26/2020  WBC 4.0 - 10.5 K/uL 7.6 8.2 7.4  Hemoglobin 12.0 - 15.0 g/dL 12.8 12.1 12.4  Hematocrit 36.0 - 46.0 % 39.3 35.6(L) 37.0  Platelets 150.0 - 400.0 K/uL 499.0(H) 528.0(H) 487.0(H)    No results found for: VD25OH  Clinical ASCVD: No  The 10-year ASCVD risk score Mikey Bussing DC Jr., et al., 2013) is: 10%   Values used to calculate the score:     Age: 13 years     Sex: Female     Is Non-Hispanic African American: No     Diabetic: No     Tobacco smoker: No  Systolic Blood Pressure: 161 mmHg     Is BP treated: Yes     HDL Cholesterol: 67.8 mg/dL     Total Cholesterol: 176 mg/dL    Depression screen [facility] 2/9 04/27/2021 04/26/2020 04/14/2019  Decreased Interest 0 0 0  Down, Depressed, Hopeless 0 0 0  PHQ - 2 Score 0 0 0  Altered sleeping 0 0 0  Tired, decreased energy 0 0 0  Change in appetite 0 0 0  Feeling bad or failure about yourself  0 0 0  Trouble concentrating 0 0 0  Moving slowly or fidgety/restless 0 0 0  Suicidal thoughts 0 0 0  PHQ-9 Score 0 0 0  Difficult doing work/chores Not difficult at all Not difficult at all Not difficult at all     Social History   Tobacco Use  Smoking Status Never  Smokeless Tobacco Never   BP Readings from Last 3 Encounters:  06/28/21 118/84  05/02/21 133/81  04/27/21 136/83   Pulse Readings from Last 3 Encounters:  06/28/21 97   05/02/21 78  04/27/21 90   Wt Readings from Last 3 Encounters:  06/28/21 173 lb (78.5 kg)  05/02/21 183 lb 8 oz (83.2 kg)  01/30/21 182 lb 7 oz (82.8 kg)   BMI Readings from Last 3 Encounters:  06/28/21 28.57 kg/m  05/02/21 30.30 kg/m  01/30/21 29.90 kg/m    Assessment/Interventions: Review of patient past medical history, allergies, medications, health status, including review of consultants reports, laboratory and other test data, was performed as part of comprehensive evaluation and provision of chronic care management services.   SDOH:  (Social Determinants of Health) assessments and interventions performed: Yes SDOH Interventions    Flowsheet Row Most Recent Value  SDOH Interventions   Financial Strain Interventions Intervention Not Indicated      SDOH Screenings   Alcohol Screen: Low Risk    Last Alcohol Screening Score (AUDIT): 0  Depression (PHQ2-9): Low Risk    PHQ-2 Score: 0  Financial Resource Strain: Low Risk    Difficulty of Paying Living Expenses: Not very hard  Food Insecurity: No Food Insecurity   Worried About Charity fundraiser in the Last Year: Never true   Ran Out of Food in the Last Year: Never true  Housing: Low Risk    Last Housing Risk Score: 0  Physical Activity: Sufficiently Active   Days of Exercise per Week: 7 days   Minutes of Exercise per Session: 30 min  Social Connections: Not on file  Stress: No Stress Concern Present   Feeling of Stress : Not at all  Tobacco Use: Low Risk    Smoking Tobacco Use: Never   Smokeless Tobacco Use: Never  Transportation Needs: No Transportation Needs   Lack of Transportation (Medical): No   Lack of Transportation (Non-Medical): No    CCM Care Plan  Allergies  Allergen Reactions   Contrast Media [Iodinated Diagnostic Agents] Anaphylaxis   Shellfish-Derived Products Anaphylaxis   Septra [Sulfamethoxazole-Trimethoprim]     Nausea     Medications Reviewed Today     Reviewed by Debbora Dus, [facility] (Pharmacist) on 07/23/21 at 1318  Med List Status: <None>   Medication Order Taking? Sig Documenting Provider Last Dose Status Informant  acyclovir (ZOVIRAX) 400 MG tablet [id number] Yes TAKE 1 TABLET BY MOUTH  DAILY Tower, Wynelle Fanny, MD Taking Active   calcium-vitamin D (OSCAL WITH D) 500-200 MG-UNIT per tablet [id number] Yes Take 2 tablets by mouth daily. [provider] Taking  Active Self  fenofibrate 54 MG tablet [id number] Yes Take 1 tablet (54 mg total) by mouth daily. Tower, Wynelle Fanny, MD Taking Active   levothyroxine (SYNTHROID) 100 MCG tablet [id number] Yes Take 1 tablet (100 mcg total) by mouth daily. Tower, Wynelle Fanny, MD Taking Active   liothyronine (CYTOMEL) 25 MCG tablet [id number] Yes TAKE ONE-HALF TABLET BY  MOUTH EVERY EVENING Tower, Wynelle Fanny, MD Taking Active   montelukast (SINGULAIR) 10 MG tablet [id number] Yes Take 1 tablet (10 mg total) by mouth daily. Tower, Wynelle Fanny, MD Taking Active   Multiple Vitamin (MULTIVITAMIN) tablet [id number] Yes Take 1 tablet by mouth daily. [provider] Taking Active Self  MYRBETRIQ 50 MG TB24 tablet [id number] Yes TAKE 1 TABLET BY MOUTH  DAILY Tower, Wynelle Fanny, MD Taking Active   propranolol ER (INDERAL LA) 120 MG 24 hr capsule [id number] Yes Take 1 capsule (120 mg total) by mouth daily. Tower, Wynelle Fanny, MD Taking Active   SUMAtriptan (IMITREX) 100 MG tablet [id number] Yes TAKE 1 TABLET FOR HEADACHE MAY REPEAT ONCE IN 2 HOURS IF NEEDED. MAXIMUM OF 2    TABLETS IN ONE DAY.  Patient taking differently: Take 100 mg by mouth as needed for migraine or headache. TAKE 100 mg TABLET FOR HEADACHE MAY REPEAT ONCE IN 2 HOURS IF NEEDED. MAXIMUM OF 200 mg    TABLETS IN ONE DAY.   Tower, Wynelle Fanny, MD Taking Active Self  vitamin B-12 (CYANOCOBALAMIN) 500 MCG tablet [id number] Yes Take 500 mcg by mouth daily. 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   Dyspepsia 04/14/2017   Colon cancer screening 04/09/2017   Prediabetes 03/30/2017   Medicare annual wellness visit, initial 04/04/2016   Other screening mammogram 12/31/2011   Post-menopausal 12/31/2011   Routine general medical examination at a health care facility 12/23/2011   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   Hypothyroidism 11/08/2008   HSV (herpes simplex virus) infection 09/23/2007   Hypertriglyceridemia 09/23/2007   Essential hypertension 09/23/2007   ALLERGIC RHINITIS 09/23/2007   ASTHMA 09/23/2007   HYPOKALEMIA 04/16/2007   Thrombocytosis ([place]) 04/09/2007    Immunization History  Administered Date(s) Administered   Influenza Split 08/10/2011   Influenza Whole 10/10/2007, 08/17/2008, 08/31/2009, 08/09/2010   Influenza, High Dose Seasonal PF 07/18/2017, 07/09/2018, 08/06/2019, 08/16/2020, 08/16/2020, 08/17/2020, 07/13/2021   Influenza,inj,Quad PF,6+ Mos 08/04/2015, 09/02/2016   Influenza-Unspecified 08/09/2014   PFIZER Comirnaty(Gray Top)Covid-19 Tri-Sucrose Vaccine 03/09/2021   PFIZER(Purple Top)SARS-COV-2 Vaccination 01/17/2020, 02/09/2020, 09/07/2020   PNEUMOCOCCAL CONJUGATE-20 05/24/2021   Pneumococcal Conjugate-13 11/28/2015   Pneumococcal Polysaccharide-23 08/25/2003, 10/10/2007   Pneumococcal-Unspecified 11/27/2016   Td 10/21/2006   Tdap 03/20/2015   Zoster Recombinat (Shingrix) 09/04/2018, 12/09/2018   Zoster, Live 03/25/2012    Conditions to be addressed/monitored:  Hypertension and Hyperlipidemia  Patient Care Plan: CCM Pharmacy Care Plan     Problem Identified: CHL AMB "PATIENT-SPECIFIC PROBLEM"      Long-Range Goal: Disease Management   Start Date: 07/23/2021  Priority: High  Note:   Current Barriers:  Coordination  of referral needed - nephrology  Pharmacist Clinical Goal(s):  Patient will contact provider office for questions/concerns as evidenced notation of same in electronic  health record through collaboration with PharmD and provider.   Interventions: 1:1 collaboration with Tower, Wynelle Fanny, MD regarding development and update of comprehensive plan of care as evidenced by provider attestation and co-signature Inter-disciplinary care team collaboration (see longitudinal plan of care) Comprehensive medication review performed; medication list updated in electronic medical record  Hypertension (BP goal <130/80) -Not ideally controlled - would target less than 130/80 given CKD. Home BP slightly above this goal.  -Current treatment: Propranolol ER 120 mg - 1 capsule daily -Medications previously tried: irbesartan/hctz - held due to renal function/hypokalemia 2019 during hospitalization  -Current home readings: 130s/80s on home checks -Current dietary habits: not discussed at this visit  -Current exercise habits: Walks 5 days per week -Denies hypotensive/hypertensive symptoms. No imbalance or falls. -patient was referred to nephrology but reports this has not been scheduled. She is waiting on follow up from [place]. Will check with referral coordinator. Would not change propanolol due to her migraine history. Prior discussed ACE/ARB and/or Farxiga for CKD but would like her to see nephrology first. -Counseled to monitor BP at home monthly (using arm cuff), document, and provide log at future appointments -Recommended to continue current medication; Follow up on nephrology referral for office.  Hypertriglyceridemia: (LDL goal < 100, TG < 150, HDL > 50) -Controlled (fenofibrate restarted 05/30/21 per PCP), her TG are mildly elevated, < 500. -Current treatment: Fenofibrate 54 mg - 1 tablet daily -Medications previously tried: fenofibrate 160 mg daily, decreased due to CKD -Educated on Cholesterol goals;   -Recommended to continue current medication; Recommend continue daily exercise.   Hypothyroidism (Goal: TSH WNL, symptom improvement) -Uncontrolled - reports symptoms of fatigue and memory loss -Current treatment  Synthroid 100 mcg - 1 tablet daily before breakfast Cytomel 25 mcg - 1/2 tablet daily -Medications previously tried: generic synthroid   -Pt affirms current dose 100 mcg daily since PCP decreased from 125 mcg daily 05/02/21. TSH still low 6 weeks later on recheck 05/30/21. Pt referred to endo and appt is not until Sept 25, 2022. She is ok with waiting until then for further adjustments. She takes Synthroid on empty stomach before all other medications. -Recommended to continue current medication  Patient Goals/Self-Care Activities Patient will:  - take medications as prescribed - contact office or ccm team with any health concerns  Follow Up Plan: Telephone follow up appointment with care management team member scheduled for:  12 months CCM visit, 6 months general adherence review     Medication Assistance: None required.  Patient affirms current coverage meets needs.  Compliance/Adherence/Medication fill history: Care Gaps: None  Star-Rating Drugs: Medication:                Last Fill:         Day Supply None identified  Patient's preferred pharmacy is: [facility]  ([place], [address] [place] [address] Phone: [phone] Fax: [phone]  All meds from mail order except acute meds - [facility] endorses 100% compliance  Care Plan and Follow Up Patient Decision:  Patient agrees to Care Plan and Follow-up.  Debbora Dus, PharmD Clinical Pharmacist [facility] at [facility] [phone]

## 2021-07-24 ENCOUNTER — Other Ambulatory Visit (INDEPENDENT_AMBULATORY_CARE_PROVIDER_SITE_OTHER): Payer: Medicare Other

## 2021-07-24 ENCOUNTER — Telehealth: Payer: Self-pay

## 2021-07-24 ENCOUNTER — Other Ambulatory Visit: Payer: Self-pay

## 2021-07-24 DIAGNOSIS — E781 Pure hyperglyceridemia: Secondary | ICD-10-CM

## 2021-07-24 LAB — LIPID PANEL
Cholesterol: 178 mg/dL (ref 0–200)
HDL: 68.7 mg/dL (ref >39.00)
NonHDL: 109.54
Total CHOL/HDL Ratio: 3
Triglycerides: 256 mg/dL — ABNORMAL HIGH (ref 0.0–149.0)
VLDL: 51.2 mg/dL — ABNORMAL HIGH (ref 0.0–40.0)

## 2021-07-24 LAB — LDL CHOLESTEROL, DIRECT: Direct LDL: 84 mg/dL

## 2021-07-24 LAB — ALT: ALT: 13 U/L (ref 0–35)

## 2021-07-24 LAB — AST: AST: 18 U/L (ref 0–37)

## 2021-07-24 NOTE — Patient Instructions (Signed)
Dear Alfredia Client,  Below is a summary of the goals we discussed during our follow up appointment on July 23, 2021. Please contact me anytime with questions or concerns.   Visit Information  Patient Care Plan: CCM Pharmacy Care Plan     Problem Identified: CHL AMB "PATIENT-SPECIFIC PROBLEM"      Long-Range Goal: Disease Management   Start Date: 07/23/2021  Priority: High  Note:   Current Barriers:  Coordination of referral needed - nephrology  Pharmacist Clinical Goal(s):  Patient will contact provider office for questions/concerns as evidenced notation of same in electronic health record through collaboration with PharmD and provider.   Interventions: 1:1 collaboration with Tower, Wynelle Fanny, MD regarding development and update of comprehensive plan of care as evidenced by provider attestation and co-signature Inter-disciplinary care team collaboration (see longitudinal plan of care) Comprehensive medication review performed; medication list updated in electronic medical record  Hypertension (BP goal <130/80) -Not ideally controlled - would target less than 130/80 given CKD. Home BP slightly above this goal.  -Current treatment: Propranolol ER 120 mg - 1 capsule daily -Medications previously tried: irbesartan/hctz - held due to renal function/hypokalemia 2019 during hospitalization  -Current home readings: 130s/80s on home checks -Current dietary habits: not discussed at this visit  -Current exercise habits: Walks 5 days per week -Denies hypotensive/hypertensive symptoms. No imbalance or falls. -patient was referred to nephrology but reports this has not been scheduled. She is waiting on follow up from [place]. Will check with referral coordinator. Would not change propanolol due to her migraine history. Prior discussed ACE/ARB and/or Farxiga for CKD but would like her to see nephrology first. -Counseled to monitor BP at home monthly (using arm cuff), document, and provide log at  future appointments -Recommended to continue current medication; Follow up on nephrology referral for office.  Hypertriglyceridemia: (LDL goal < 100, TG < 150, HDL > 50) -Controlled (fenofibrate restarted 05/30/21 per PCP), her TG are mildly elevated, < 500. -Current treatment: Fenofibrate 54 mg - 1 tablet daily -Medications previously tried: fenofibrate 160 mg daily, decreased due to CKD -Educated on Cholesterol goals;  -Recommended to continue current medication; Recommend continue daily exercise.   Hypothyroidism (Goal: TSH WNL, symptom improvement) -Uncontrolled - reports symptoms of fatigue and memory loss -Current treatment  Synthroid 100 mcg - 1 tablet daily before breakfast Cytomel 25 mcg - 1/2 tablet daily -Medications previously tried: generic synthroid   -Pt affirms current dose 100 mcg daily since PCP decreased from 125 mcg daily 05/02/21. TSH still low 6 weeks later on recheck 05/30/21. Pt referred to endo and appt is not until Sept 25, 2022. She is ok with waiting until then for further adjustments. She takes Synthroid on empty stomach before all other medications. -Recommended to continue current medication  Patient Goals/Self-Care Activities Patient will:  - take medications as prescribed - contact office or ccm team with any health concerns  Follow Up Plan: Telephone follow up appointment with care management team member scheduled for:  12 months CCM visit, 6 months general adherence review      Patient verbalizes understanding of instructions provided today and agrees to view in [place].   Debbora Dus, PharmD Clinical Pharmacist [facility] at [facility] [phone]

## 2021-07-24 NOTE — Telephone Encounter (Signed)
Patient states she is waiting to hear back about nephrology referral. Just wanted to check on status. Order discussed in lab notes 05/03/21 per Dr. Glori Bickers - I would like to refer to nephrology to discuss kidney function in more detail.  Debbora Dus, PharmD Clinical Pharmacist [facility] at [facility] [phone]

## 2021-07-26 ENCOUNTER — Encounter: Payer: Self-pay | Admitting: *Deleted

## 2021-07-30 NOTE — Telephone Encounter (Signed)
Referral faxed and added to [facility].  Their office will call to schedule the patient once they review her referral.

## 2021-07-31 ENCOUNTER — Other Ambulatory Visit: Payer: Self-pay | Admitting: Family Medicine

## 2021-08-22 ENCOUNTER — Encounter: Payer: Self-pay | Admitting: Endocrinology

## 2021-08-22 NOTE — Progress Notes (Addendum)
Patient ID: [name], female   DOB: 15-Dec-1950, 70 y.o.   MRN: [id number]            Referring Provider: Dr. Thea Alken  Reason for Appointment:  Hypothyroidism, new visit    History of Present Illness:   Hypothyroidism was first diagnosed in 2006  At the time of diagnosis patient had radioactive iron treatment for reportedly Graves' disease  With her Graves' disease she had symptoms of palpitations and increased blood pressure as well as some chest discomfort and was diagnosed during her hospitalization  Initially may have been given PTU but subsequently had I-131 treatment resulting in hypothyroidism  The patient has been treated with levothyroxine and liothyronine by her previous endocrinologist since she was diagnosed as hypothyroid Although she had been generally taking the same regimen for years she has had difficulty maintaining normal levels for the last 3 years or so She thinks this was subsequent to a respiratory viral infection  In early 2022 she was taking 75 mcg of levothyroxine along with 12.5 mcg of liothyronine However because of persistently higher TSH levels dose of levothyroxine was gradually increased all the way up to 125 mcg in March However at that time her TSH was high she was feeling good with normal energy level When her TSH was slightly low in 5/22 her dose of levothyroxine was reduced to 100 mcg; lab work about a month later showed continuously suppressed TSH She says that with increasing her levothyroxine dose she has had more feeling of fatigue and sometimes somnolence Her weight has gone down a few pounds since 5/22  The patient takes the levothyroxine supplement before breakfast but her liothyronine she is taking at about 6 PM along with her multivitamin that has calcium.  When she had a relatively high TSH she was taking her multivitamin about 1/2-hour after levothyroxine supplement in the morning         Patient's weight history is as  follows:  Wt Readings from Last 3 Encounters:  08/23/21 177 lb 6.4 oz (80.5 kg)  06/28/21 173 lb (78.5 kg)  05/02/21 183 lb 8 oz (83.2 kg)    Thyroid function results have been as follows:  Lab Results  Component Value Date   TSH 0.09 (L) 05/30/2021   TSH 0.19 (L) 04/25/2021   TSH 0.68 04/10/2021   TSH 15.57 (H) 03/06/2021   FREET4 1.19 10/15/2018   FREET4 1.20 10/14/2018   FREET4 0.73 04/04/2017   T3FREE 3.7 05/30/2021   T3FREE 3.9 01/30/2021   T3FREE 1.1 (L) 10/15/2018     Past Medical History:  Diagnosis Date   Allergic rhinitis    Allergy    Anemia    b12 def.   Asthma    controlled w/ singulair   Herpes simplex without mention of complication    controlled w/ meds (no current fever blisters)   History of kidney stones    HLD (hyperlipidemia)    borderline-  no meds   HTN (hypertension)    echo- 2006   Hyperthyroidism hx ptu 2006   takes synthroid; had Graves disease, was treated for that- now thyroid doesn't work   Leukocytosis, unspecified hx of 10 yrs ago   no problem since   Migraine    Mixed incontinence    urge and stress incontinence   Neuromuscular disorder ([place])    hiatal hernia   Renal cyst right    pt is unsure of this, has had kidney stones   Ureteral calculi  right    s/p laser  litho w/ stone extraction 01-28-11    Past Surgical History:  Procedure Laterality Date   BIOPSY  10/16/2018   Procedure: BIOPSY;  Surgeon: Milus Banister, MD;  Location: [facility] ENDOSCOPY;  Service: Endoscopy;;   [place]   fusion c4-6   CERVICAL FUSION  2005   fusion c6-7 and plate removal 624THL   COLONOSCOPY     CYSTOSCOPY W/ RETROGRADES  10/21/2011   Procedure: CYSTOSCOPY WITH RETROGRADE PYELOGRAM;  Surgeon: Ailene Rud, MD;  Location: [facility];  Service: Urology;  Laterality: Right;  CYSTOSCOPY RIGHT RETROGRADE, PYELOGRAM  URETEROSCOPY WITH HOLMIUM LASER AND STONE EXTRACTION    CYSTOSCOPY/RETROGRADE/URETEROSCOPY/STONE EXTRACTION WITH BASKET  01-28-11   right    ESOPHAGOGASTRODUODENOSCOPY (EGD) WITH PROPOFOL N/A 10/16/2018   Procedure: ESOPHAGOGASTRODUODENOSCOPY (EGD) WITH PROPOFOL;  Surgeon: Milus Banister, MD;  Location: [facility] ENDOSCOPY;  Service: Endoscopy;  Laterality: N/A;   REDUCTION MAMMAPLASTY Bilateral 1995   TONSILLECTOMY  1960   VAGINAL HYSTERECTOMY  1987   fibroids/anemia    Family History  Problem Relation Age of Onset   Heart failure Father    Osteoporosis Mother    Diabetes Mother    Diabetes Other        Grandfather   Coronary artery disease Other        Grandmother   Breast cancer Neg Hx    Colon cancer Neg Hx    Esophageal cancer Neg Hx    Stomach cancer Neg Hx    Rectal cancer Neg Hx     Social History:  reports that she has never smoked. She has never used smokeless tobacco. She reports that she does not drink alcohol and does not use drugs.  Allergies:  Allergies  Allergen Reactions   Contrast Media [Iodinated Diagnostic Agents] Anaphylaxis   Shellfish-Derived Products Anaphylaxis   Septra [Sulfamethoxazole-Trimethoprim]     Nausea     Allergies as of 08/23/2021       Reactions   Contrast Media [iodinated Diagnostic Agents] Anaphylaxis   Shellfish-derived Products Anaphylaxis   Septra [sulfamethoxazole-trimethoprim]    Nausea         Medication List        Accurate as of August 23, 2021  2:06 PM. If you have any questions, ask your nurse or doctor.          acyclovir 400 MG tablet Commonly known as: ZOVIRAX TAKE 1 TABLET BY MOUTH  DAILY   calcium-vitamin D 500-200 MG-UNIT tablet Commonly known as: OSCAL WITH D Take 2 tablets by mouth daily.   fenofibrate 54 MG tablet TAKE 1 TABLET BY MOUTH  DAILY   levothyroxine 100 MCG tablet Commonly known as: SYNTHROID Take 1 tablet (100 mcg total) by mouth daily.   liothyronine 25 MCG tablet Commonly known as: CYTOMEL TAKE ONE-HALF TABLET BY  MOUTH EVERY  EVENING   montelukast 10 MG tablet Commonly known as: SINGULAIR Take 1 tablet (10 mg total) by mouth daily.   multivitamin tablet Take 1 tablet by mouth daily.   Myrbetriq 50 MG Tb24 tablet Generic drug: mirabegron ER TAKE 1 TABLET BY MOUTH  DAILY   propranolol ER 120 MG 24 hr capsule Commonly known as: INDERAL LA Take 1 capsule (120 mg total) by mouth daily.   SUMAtriptan 100 MG tablet Commonly known as: IMITREX TAKE 1 TABLET FOR HEADACHE MAY REPEAT ONCE IN 2 HOURS IF NEEDED. MAXIMUM OF 2  TABLETS IN ONE DAY. What changed:  how much to take how to take this when to take this reasons to take this additional instructions   vitamin B-12 500 MCG tablet Commonly known as: CYANOCOBALAMIN Take 500 mcg by mouth daily.           Review of Systems  Constitutional:  Positive for weight loss and diaphoresis.  HENT:         Has had only rare migraines  Eyes:  Negative for visual disturbance.  Respiratory:  Negative for shortness of breath.   Cardiovascular:  Negative for palpitations.       She may be taking Inderal for hypertension  Gastrointestinal:  Negative for constipation and diarrhea.  Endocrine: Positive for fatigue. Negative for cold intolerance and heat intolerance.  Musculoskeletal:  Negative for joint pain.  Skin:  Negative for dry skin.  Neurological:  Negative for weakness and numbness.  Psychiatric/Behavioral:  Negative for insomnia.               Examination:    BP 138/90   Pulse 80   Ht 5' 5.5" (1.664 m)   Wt 177 lb 6.4 oz (80.5 kg)   SpO2 96%   BMI 29.07 kg/m   GENERAL:  Relatively large build, no cushingoid features.   No pallor.    Skin:  no rash or diaphoresis  EYES:  No prominence of the eyes or swelling of the eyelids  ENT: Oral exam not indicated  NECK: No lymphadenopathy  THYROID:  Not palpable.  HEART:  Normal  S1 and S2; no murmur or click.  CHEST:    Lungs: Vescicular breath sounds heard equally.  No crepitations/  wheeze.  ABDOMEN:  No distention.  Liver and spleen not palpable.  No other mass or tenderness.  NEUROLOGICAL: Reflexes are bilaterally brisk at biceps.  EXTREMITIES:  Normal peripheral joints.  No ankle edema present   Assessment:  HYPOTHYROIDISM, post radioactive iodine treatment for Graves' disease several years ago  For unknown reasons she has had variable requirements of her generic levothyroxine  Appears that with increasing her dose of levothyroxine progressively earlier this year her TSH finally became suppressed No labs available since June She thinks she feels better when she is hypothyroid by her labs and with normalization or low TSH she feels lethargic  Her exam today is unremarkable except for brisk reflexes  Previously also appears to have been taking her liothyronine in the morning but now for several weeks has been taking it with her calcium vitamin before dinner.  Also her calcium supplement was taken 30 minutes after her thyroid supplements in the mornings when her TSH was high.  Not clear if this was playing a role in her hypothyroidism  Current regimen is 100 mcg of levothyroxine and 12.5 mcg of liothyronine  PLAN:   Check thyroid levels today and decide on her doses Since usually her T3 levels are normal likely will not need to change her liothyronine dose Also will need to make sure her dose changes are not made more than every 6 weeks or so Follow-up   Elayne Snare 08/23/2021, 2:06 PM   Consultation note copy sent to the PCP  Note: This office note was prepared with Dragon voice recognition system technology. Any transcriptional errors that result from this process are unintentional.  Addendum: Labs as follows  T3 is above normal and free T4 is relatively lower at 1.07 Instead of 12.5 Cytomel daily she needs to now take 5 mcg  tablets, 1-1/2 daily, follow-up as scheduled  Also continue same dose of levothyroxine New prescription sent

## 2021-08-23 ENCOUNTER — Encounter: Payer: Self-pay | Admitting: Endocrinology

## 2021-08-23 ENCOUNTER — Other Ambulatory Visit: Payer: Self-pay

## 2021-08-23 ENCOUNTER — Encounter: Payer: Self-pay | Admitting: Family Medicine

## 2021-08-23 ENCOUNTER — Ambulatory Visit (INDEPENDENT_AMBULATORY_CARE_PROVIDER_SITE_OTHER): Payer: Medicare Other | Admitting: Endocrinology

## 2021-08-23 VITALS — BP 138/90 | HR 80 | Ht 65.5 in | Wt 177.4 lb

## 2021-08-23 DIAGNOSIS — E890 Postprocedural hypothyroidism: Secondary | ICD-10-CM

## 2021-08-23 NOTE — Patient Instructions (Signed)
Cytomel in also

## 2021-08-29 ENCOUNTER — Other Ambulatory Visit (INDEPENDENT_AMBULATORY_CARE_PROVIDER_SITE_OTHER): Payer: Medicare Other

## 2021-08-29 ENCOUNTER — Other Ambulatory Visit: Payer: Self-pay

## 2021-08-29 DIAGNOSIS — E890 Postprocedural hypothyroidism: Secondary | ICD-10-CM

## 2021-08-29 LAB — TSH: TSH: 0.02 u[IU]/mL — ABNORMAL LOW (ref 0.35–5.50)

## 2021-08-29 LAB — T3, FREE: T3, Free: 4.5 pg/mL — ABNORMAL HIGH (ref 2.3–4.2)

## 2021-08-29 LAB — T4, FREE: Free T4: 1.07 ng/dL (ref 0.60–1.60)

## 2021-09-04 ENCOUNTER — Telehealth: Payer: Self-pay | Admitting: Endocrinology

## 2021-09-04 MED ORDER — LIOTHYRONINE SODIUM 5 MCG PO TABS: 7.5000 ug | ORAL_TABLET | Freq: Every day | ORAL | 1 refills | Status: DC

## 2021-09-04 NOTE — Addendum Note (Signed)
Addended by: Elayne Snare on: 09/04/2021 02:58 PM   Modules accepted: Orders

## 2021-09-04 NOTE — Telephone Encounter (Signed)
Patient requests to be called at ph# [phone] to be given her lab results and discuss dosage change for Synthroid

## 2021-09-05 DIAGNOSIS — N1832 Chronic kidney disease, stage 3b: Secondary | ICD-10-CM | POA: Diagnosis not present

## 2021-09-05 DIAGNOSIS — N390 Urinary tract infection, site not specified: Secondary | ICD-10-CM | POA: Diagnosis not present

## 2021-09-05 DIAGNOSIS — I129 Hypertensive chronic kidney disease with stage 1 through stage 4 chronic kidney disease, or unspecified chronic kidney disease: Secondary | ICD-10-CM | POA: Diagnosis not present

## 2021-09-06 DIAGNOSIS — N189 Chronic kidney disease, unspecified: Secondary | ICD-10-CM | POA: Diagnosis not present

## 2021-09-06 DIAGNOSIS — N1832 Chronic kidney disease, stage 3b: Secondary | ICD-10-CM | POA: Diagnosis not present

## 2021-09-11 ENCOUNTER — Other Ambulatory Visit: Payer: Self-pay | Admitting: Nephrology

## 2021-09-11 DIAGNOSIS — N1832 Chronic kidney disease, stage 3b: Secondary | ICD-10-CM

## 2021-09-12 ENCOUNTER — Ambulatory Visit
Admission: RE | Admit: 2021-09-12 | Discharge: 2021-09-12 | Disposition: A | Discharge status: Home / Self Care | Payer: Medicare Other | Source: Ambulatory Visit | Attending: Nephrology | Admitting: Nephrology

## 2021-09-12 DIAGNOSIS — N1330 Unspecified hydronephrosis: Secondary | ICD-10-CM | POA: Diagnosis not present

## 2021-09-12 DIAGNOSIS — N1832 Chronic kidney disease, stage 3b: Secondary | ICD-10-CM

## 2021-09-14 ENCOUNTER — Encounter: Payer: Self-pay | Admitting: Family Medicine

## 2021-09-18 DIAGNOSIS — N1330 Unspecified hydronephrosis: Secondary | ICD-10-CM | POA: Diagnosis not present

## 2021-09-26 ENCOUNTER — Other Ambulatory Visit: Payer: Self-pay | Admitting: Endocrinology

## 2021-10-02 DIAGNOSIS — K449 Diaphragmatic hernia without obstruction or gangrene: Secondary | ICD-10-CM | POA: Diagnosis not present

## 2021-10-02 DIAGNOSIS — N200 Calculus of kidney: Secondary | ICD-10-CM | POA: Diagnosis not present

## 2021-10-02 DIAGNOSIS — M47816 Spondylosis without myelopathy or radiculopathy, lumbar region: Secondary | ICD-10-CM | POA: Diagnosis not present

## 2021-10-02 DIAGNOSIS — N1330 Unspecified hydronephrosis: Secondary | ICD-10-CM | POA: Diagnosis not present

## 2021-10-05 ENCOUNTER — Other Ambulatory Visit: Payer: Self-pay

## 2021-10-05 ENCOUNTER — Other Ambulatory Visit (INDEPENDENT_AMBULATORY_CARE_PROVIDER_SITE_OTHER): Payer: Medicare Other

## 2021-10-05 DIAGNOSIS — E890 Postprocedural hypothyroidism: Secondary | ICD-10-CM | POA: Diagnosis not present

## 2021-10-05 LAB — T4, FREE: Free T4: 0.89 ng/dL (ref 0.60–1.60)

## 2021-10-05 LAB — TSH: TSH: 0.03 u[IU]/mL — ABNORMAL LOW (ref 0.35–5.50)

## 2021-10-08 ENCOUNTER — Other Ambulatory Visit: Payer: Medicare Other

## 2021-10-09 DIAGNOSIS — N202 Calculus of kidney with calculus of ureter: Secondary | ICD-10-CM | POA: Diagnosis not present

## 2021-10-09 DIAGNOSIS — N1330 Unspecified hydronephrosis: Secondary | ICD-10-CM | POA: Diagnosis not present

## 2021-10-09 DIAGNOSIS — N261 Atrophy of kidney (terminal): Secondary | ICD-10-CM | POA: Diagnosis not present

## 2021-10-11 ENCOUNTER — Encounter: Payer: Self-pay | Admitting: Endocrinology

## 2021-10-11 ENCOUNTER — Other Ambulatory Visit: Payer: Self-pay

## 2021-10-11 ENCOUNTER — Ambulatory Visit (INDEPENDENT_AMBULATORY_CARE_PROVIDER_SITE_OTHER): Payer: Medicare Other | Admitting: Endocrinology

## 2021-10-11 VITALS — BP 160/110 | HR 85 | Ht 65.5 in | Wt 174.4 lb

## 2021-10-11 DIAGNOSIS — E890 Postprocedural hypothyroidism: Secondary | ICD-10-CM | POA: Diagnosis not present

## 2021-10-11 MED ORDER — AMLODIPINE BESYLATE 5 MG PO TABS: 5.0000 mg | ORAL_TABLET | Freq: Every day | ORAL | 2 refills | Status: DC

## 2021-10-11 NOTE — Progress Notes (Signed)
Patient ID: Alfredia Client, female   DOB: 01-Jul-1951, 70 y.o.   MRN: [id number]            Referring Provider: Dr. Thea Alken  Reason for Appointment:  Hypothyroidism, f/u  visit    History of Present Illness:   Hypothyroidism was first diagnosed in 2006  At the time of diagnosis patient had radioactive iron treatment for reportedly Graves' disease  With her Graves' disease she had symptoms of palpitations and increased blood pressure as well as some chest discomfort and was diagnosed during her hospitalization  Initially may have been given PTU but subsequently had I-131 treatment resulting in hypothyroidism  The patient has been treated with levothyroxine and liothyronine by her previous endocrinologist since she was diagnosed as hypothyroid Although she had been generally taking the same regimen for years she has had difficulty maintaining normal levels for the last 3 years or so She thinks this was subsequent to a respiratory viral infection  In early 2022 she was taking 75 mcg of levothyroxine along with 12.5 mcg of liothyronine However because of persistently higher TSH levels dose of levothyroxine was gradually increased all the way up to 125 mcg in March However at that time her TSH was high she was feeling good with normal energy level When her TSH was slightly low in 5/22 her dose of levothyroxine was reduced to 100 mcg; lab work about a month later showed continuously suppressed TSH She says that with increasing her levothyroxine dose she has had more feeling of fatigue and sometimes somnolence  RECENT history:  On her initial visit she was complaining of fatigue and some limitations but not palpitations  With her TSH being low and free T3 level high her red and was adjusted as follows: Instead of 12.5 Cytomel daily she was told to take 5 mcg tablets, 1-1/2 daily before breakfast Also the timing of her liothyronine was changed to before breakfast instead of suppertime with  her vitamin  However she now says that she feels worse than before and is complaining of more fatigue and somnolence She also has had more sweats, 3 pounds weight loss but no shakiness or palpitations  She has taken both her supplements before breakfast consistently Also taking 100 mcg levothyroxine  Patient's weight history is as follows:  Wt Readings from Last 3 Encounters:  10/11/21 174 lb 6.4 oz (79.1 kg)  08/23/21 177 lb 6.4 oz (80.5 kg)  06/28/21 173 lb (78.5 kg)    Thyroid function results have been as follows:  Lab Results  Component Value Date   TSH 0.03 (L) 10/05/2021   TSH 0.02 (L) 08/29/2021   TSH 0.09 (L) 05/30/2021   TSH 0.19 (L) 04/25/2021   FREET4 0.89 10/05/2021   FREET4 1.07 08/29/2021   FREET4 1.19 10/15/2018   FREET4 1.20 10/14/2018   T3FREE 4.5 (H) 08/29/2021   T3FREE 3.7 05/30/2021   T3FREE 3.9 01/30/2021     Past Medical History:  Diagnosis Date   Allergic rhinitis    Allergy    Anemia    b12 def.   Asthma    controlled w/ singulair   Herpes simplex without mention of complication    controlled w/ meds (no current fever blisters)   History of kidney stones    HLD (hyperlipidemia)    borderline-  no meds   HTN (hypertension)    echo- 2006   Hyperthyroidism hx ptu 2006   takes synthroid; had Graves disease, was treated for that- now  thyroid doesn't work   Leukocytosis, unspecified hx of 10 yrs ago   no problem since   Migraine    Mixed incontinence    urge and stress incontinence   Neuromuscular disorder ([place])    hiatal hernia   Renal cyst right    pt is unsure of this, has had kidney stones   Ureteral calculi right    s/p laser  litho w/ stone extraction 01-28-11    Past Surgical History:  Procedure Laterality Date   BIOPSY  10/16/2018   Procedure: BIOPSY;  Surgeon: Milus Banister, MD;  Location: [facility] ENDOSCOPY;  Service: Endoscopy;;   [place]   fusion c4-6   CERVICAL FUSION   2005   fusion c6-7 and plate removal E0-1   COLONOSCOPY     CYSTOSCOPY W/ RETROGRADES  10/21/2011   Procedure: CYSTOSCOPY WITH RETROGRADE PYELOGRAM;  Surgeon: Ailene Rud, MD;  Location: [place];  Service: Urology;  Laterality: Right;  CYSTOSCOPY RIGHT RETROGRADE, PYELOGRAM  URETEROSCOPY WITH HOLMIUM LASER AND STONE EXTRACTION   CYSTOSCOPY/RETROGRADE/URETEROSCOPY/STONE EXTRACTION WITH BASKET  01-28-11   right    ESOPHAGOGASTRODUODENOSCOPY (EGD) WITH PROPOFOL N/A 10/16/2018   Procedure: ESOPHAGOGASTRODUODENOSCOPY (EGD) WITH PROPOFOL;  Surgeon: Milus Banister, MD;  Location: [facility] ENDOSCOPY;  Service: Endoscopy;  Laterality: N/A;   REDUCTION MAMMAPLASTY Bilateral 1995   TONSILLECTOMY  1960   VAGINAL HYSTERECTOMY  1987   fibroids/anemia    Family History  Problem Relation Age of Onset   Osteoporosis Mother    Diabetes Mother    Heart disease Father    Heart failure Father    Diabetes Other        Grandfather   Coronary artery disease Other        Grandmother   Breast cancer Neg Hx    Colon cancer Neg Hx    Esophageal cancer Neg Hx    Stomach cancer Neg Hx    Rectal cancer Neg Hx    Thyroid disease Neg Hx     Social History:  reports that she has never smoked. She has never used smokeless tobacco. She reports that she does not drink alcohol and does not use drugs.  Allergies:  Allergies  Allergen Reactions   Contrast Media [Iodinated Diagnostic Agents] Anaphylaxis   Shellfish-Derived Products Anaphylaxis   Septra [Sulfamethoxazole-Trimethoprim]     Nausea     Allergies as of 10/11/2021       Reactions   Contrast Media [iodinated Diagnostic Agents] Anaphylaxis   Shellfish-derived Products Anaphylaxis   Septra [sulfamethoxazole-trimethoprim]    Nausea         Medication List        Accurate as of October 11, 2021  9:49 AM. If you have any questions, ask your nurse or doctor.          acyclovir 400 MG tablet Commonly known as:  ZOVIRAX TAKE 1 TABLET BY MOUTH  DAILY   calcium-vitamin D 500-200 MG-UNIT tablet Commonly known as: OSCAL WITH D Take 2 tablets by mouth daily.   fenofibrate 54 MG tablet TAKE 1 TABLET BY MOUTH  DAILY   levothyroxine 100 MCG tablet Commonly known as: SYNTHROID Take 1 tablet (100 mcg total) by mouth daily.   liothyronine 5 MCG tablet Commonly known as: CYTOMEL TAKE 1.5 TABLETS (7.5 MCG TOTAL) BY MOUTH DAILY.   montelukast 10 MG tablet Commonly known as: SINGULAIR Take 1 tablet (10 mg total) by mouth daily.  multivitamin tablet Take 1 tablet by mouth daily.   Myrbetriq 50 MG Tb24 tablet Generic drug: mirabegron ER TAKE 1 TABLET BY MOUTH  DAILY   propranolol ER 120 MG 24 hr capsule Commonly known as: INDERAL LA Take 1 capsule (120 mg total) by mouth daily.   SUMAtriptan 100 MG tablet Commonly known as: IMITREX TAKE 1 TABLET FOR HEADACHE MAY REPEAT ONCE IN 2 HOURS IF NEEDED. MAXIMUM OF 2    TABLETS IN ONE DAY. What changed:  how much to take how to take this when to take this reasons to take this additional instructions   vitamin B-12 500 MCG tablet Commonly known as: CYANOCOBALAMIN Take 500 mcg by mouth daily.           Review of Systems  Not clear if she has had a diagnosis of hypertension, only on propranolol      BP Readings from Last 3 Encounters:  10/11/21 (!) 170/118  08/23/21 138/90  06/28/21 118/84          She has hydronephrosis and right atrophic kidney, awaiting surgery   Examination:    BP (!) 170/118 (BP Location: Left Arm, Patient Position: Sitting, Cuff Size: Normal)   Pulse 85   Ht 5' 5.5" (1.664 m)   Wt 174 lb 6.4 oz (79.1 kg)   SpO2 97%   BMI 28.58 kg/m   GENERAL:  Does not appear unusually anxious or hyperkinetic  Hands are slightly warm but not diaphoretic  HEART:  Normal  S1 and S2; no murmur or click.  Heart rhythm regular   NEUROLOGICAL: Reflexes are slightly brisk at biceps. No  tremor    Assessment:  HYPOTHYROIDISM, post radioactive iodine treatment for Graves' disease several years ago  Current regimen is 100 mcg of levothyroxine and 7.5 mcg of liothyronine  She is complaining of fatigue and sweating as well as some weight loss and she feels like her thyroid levels are high Although her T3 is low her free T4 is normal and slightly lower than before  HYPERTENSION: Her blood pressure appears to be higher than usual Not clear if her renal function has changed recently, followed by urology now  PLAN:   She will stop her liothyronine completely Continue 100 mcg levothyroxine She will need to check her blood pressure regularly at home  Will at least temporarily add 5 mg of amlodipine to her Inderal She will start checking blood pressure regularly at home and call her PCP if consistently high  Follow-up 1 month   Elayne Snare 10/11/2021, 9:49 AM    Note: This office note was prepared with Dragon voice recognition system technology. Any transcriptional errors that result from this process are unintentional.

## 2021-10-11 NOTE — Patient Instructions (Addendum)
Stop Cytomel and keep levothyroxine

## 2021-10-19 ENCOUNTER — Other Ambulatory Visit: Payer: Self-pay | Admitting: Urology

## 2021-10-19 DIAGNOSIS — N200 Calculus of kidney: Secondary | ICD-10-CM | POA: Diagnosis not present

## 2021-10-23 ENCOUNTER — Encounter (HOSPITAL_COMMUNITY): Payer: Self-pay

## 2021-10-23 NOTE — Patient Instructions (Signed)
DUE TO COVID-19 ONLY ONE VISITOR IS ALLOWED TO COME WITH YOU AND STAY IN THE WAITING ROOM ONLY DURING PRE OP AND PROCEDURE DAY OF SURGERY.   Up to two visitors ages 16+ are allowed at one time in a patient's room.  The visitors may rotate out with other people throughout the day.  Additionally, up to two children between the ages of 79 and 98 are allowed and do not count toward the number of allowed visitors.  Children within this age range must be accompanied by an adult visitor.  One adult visitor may remain with the patient overnight and must be in the room by 8 PM.         Your procedure is scheduled on: 11-16-21   Report to [facility] Main  Entrance   Report to admitting at      1015 AM     Call this number if you have problems the morning of surgery [phone]   Remember: Do not eat food  :After Midnight.  YOU MAY HAVE CLEAR LIQUIDS UNTIL   0945 AM  THEN NOTHING BY MOUTH    CLEAR LIQUID DIET                                                                    water Black Coffee and tea, regular and decaf No Creamer                            Plain Jell-O any favor except red or purple                                  Fruit ices (not with fruit pulp)                                      Iced Popsicles                                     Carbonated beverages, regular and diet                                    Cranberry, grape and apple juices Sports drinks like Gatorade Lightly seasoned clear broth or consume(fat free) Sugar, honey syrup  _____________________________________________________________________     BRUSH YOUR TEETH MORNING OF SURGERY AND RINSE YOUR MOUTH OUT, NO CHEWING GUM CANDY OR MINTS.     Take these medicines the morning of surgery with A SIP OF WATER: PROPRANOLOL, MYRBETRIQ, SINGULAIR, LEVOTHYROXINE, FENOFIBRATE, AMLODIPINE                                You may not have any metal on your body including hair pins and              piercings  Do  not wear jewelry, make-up, lotions, powders,perfumes,  deodorant             Do not wear nail polish on your fingernails or toenails .  Do not shave  48 hours prior to surgery.           Do not bring valuables to the hospital. [place].  Contacts, dentures or bridgework may not be worn into surgery.       Patients discharged the day of surgery will not be allowed to drive home. IF YOU ARE HAVING SURGERY AND GOING HOME THE SAME DAY, YOU MUST HAVE AN ADULT TO DRIVE YOU HOME AND BE WITH YOU FOR 24 HOURS. YOU MAY GO HOME BY TAXI OR UBER OR ORTHERWISE, BUT AN ADULT MUST ACCOMPANY YOU HOME AND STAY WITH YOU FOR 24 HOURS.  Name and phone number of your driver:  Special Instructions: N/A              Please read over the following fact sheets you were given: _____________________________________________________________________             [facility] - Preparing for Surgery Before surgery, you can play an important role.  Because skin is not sterile, your skin needs to be as free of germs as possible.  You can reduce the number of germs on your skin by washing with CHG (chlorahexidine gluconate) soap before surgery.  CHG is an antiseptic cleaner which kills germs and bonds with the skin to continue killing germs even after washing. Please DO NOT use if you have an allergy to CHG or antibacterial soaps.  If your skin becomes reddened/irritated stop using the CHG and inform your nurse when you arrive at Short Stay. Do not shave (including legs and underarms) for at least 48 hours prior to the first CHG shower.  You may shave your face/neck. Please follow these instructions carefully:  1.  Shower with CHG Soap the night before surgery and the  morning of Surgery.  2.  If you choose to wash your hair, wash your hair first as usual with your  normal  shampoo.  3.  After you shampoo, rinse your hair and body thoroughly to remove the  shampoo.                            4.  Use CHG as you would any other liquid soap.  You can apply chg directly  to the skin and wash                       Gently with a scrungie or clean washcloth.  5.  Apply the CHG Soap to your body ONLY FROM THE NECK DOWN.   Do not use on face/ open                           Wound or open sores. Avoid contact with eyes, ears mouth and genitals (private parts).                       Wash face,  Genitals (private parts) with your normal soap.             6.  Wash thoroughly, paying special attention to the area where your surgery  will be performed.  7.  Thoroughly rinse  your body with warm water from the neck down.  8.  DO NOT shower/wash with your normal soap after using and rinsing off  the CHG Soap.                9.  Pat yourself dry with a clean towel.            10.  Wear clean pajamas.            11.  Place clean sheets on your bed the night of your first shower and do not  sleep with pets. Day of Surgery : Do not apply any lotions/deodorants the morning of surgery.  Please wear clean clothes to the hospital/surgery center.  FAILURE TO FOLLOW THESE INSTRUCTIONS MAY RESULT IN THE CANCELLATION OF YOUR SURGERY PATIENT SIGNATURE_________________________________  NURSE SIGNATURE__________________________________  ________________________________________________________________________

## 2021-10-23 NOTE — Progress Notes (Addendum)
PCP - Roque Lias Tower,MD Cardiologist - no Endocrinologist - Dr. Dwyane Dee  PPM/ICD -  Device Orders -  Rep Notified -   Chest x-ray -  EKG - On chart 10-24-21 Stress Test -  ECHO -  Cardiac Cath -   Sleep Study -  CPAP -   Fasting Blood Sugar -  Checks Blood Sugar _____ times a day  Blood Thinner Instructions: Aspirin Instructions:  ERAS Protcol - PRE-SURGERY Ensure or G2-   COVID TEST- N/A COVID vaccine -Fully vaccinated + boosters [place]   Activity--Able to walk a flight of stairs without SOB Anesthesia review: HTN ,asthma  ABN EKG Comparison EKG 5732 Epic, Diastolic BP 202 pt had not taken BP med   Patient denies shortness of breath, fever, cough and chest pain at PAT appointment   All instructions explained to the patient, with a verbal understanding of the material. Patient agrees to go over the instructions while at home for a better understanding. Patient also instructed to self quarantine after being tested for COVID-19. The opportunity to ask questions was provided.

## 2021-10-24 ENCOUNTER — Other Ambulatory Visit: Payer: Self-pay

## 2021-10-24 ENCOUNTER — Encounter (HOSPITAL_COMMUNITY)
Admission: RE | Admit: 2021-10-24 | Discharge: 2021-10-24 | Disposition: A | Discharge status: Home / Self Care | Payer: Medicare Other | Source: Ambulatory Visit | Attending: Urology | Admitting: Urology

## 2021-10-24 ENCOUNTER — Encounter (HOSPITAL_COMMUNITY): Payer: Self-pay

## 2021-10-24 VITALS — BP 141/100 | HR 92 | Temp 98.8°F | Resp 16 | Ht 65.5 in | Wt 177.0 lb

## 2021-10-24 DIAGNOSIS — Z01818 Encounter for other preprocedural examination: Secondary | ICD-10-CM | POA: Insufficient documentation

## 2021-10-24 DIAGNOSIS — I10 Essential (primary) hypertension: Secondary | ICD-10-CM | POA: Insufficient documentation

## 2021-10-24 HISTORY — DX: Personal history of other diseases of the digestive system: Z87.19

## 2021-10-24 LAB — BASIC METABOLIC PANEL
Anion gap: 7 (ref 5–15)
BUN: 20 mg/dL (ref 8–23)
CO2: 26 mmol/L (ref 22–32)
Calcium: 9.4 mg/dL (ref 8.9–10.3)
Chloride: 106 mmol/L (ref 98–111)
Creatinine, Ser: 1.1 mg/dL — ABNORMAL HIGH (ref 0.44–1.00)
GFR, Estimated: 54 mL/min — ABNORMAL LOW (ref >60)
Glucose, Bld: 109 mg/dL — ABNORMAL HIGH (ref 70–99)
Potassium: 4.3 mmol/L (ref 3.5–5.1)
Sodium: 139 mmol/L (ref 135–145)

## 2021-10-24 LAB — CBC
HCT: 41.6 % (ref 36.0–46.0)
Hemoglobin: 13.2 g/dL (ref 12.0–15.0)
MCH: 27.7 pg (ref 26.0–34.0)
MCHC: 31.7 g/dL (ref 30.0–36.0)
MCV: 87.4 fL (ref 80.0–100.0)
Platelets: 524 10*3/uL — ABNORMAL HIGH (ref 150–400)
RBC: 4.76 MIL/uL (ref 3.87–5.11)
RDW: 12.7 % (ref 11.5–15.5)
WBC: 9.5 10*3/uL (ref 4.0–10.5)
nRBC: 0 % (ref 0.0–0.2)

## 2021-11-09 ENCOUNTER — Other Ambulatory Visit (INDEPENDENT_AMBULATORY_CARE_PROVIDER_SITE_OTHER): Payer: Medicare Other

## 2021-11-09 ENCOUNTER — Other Ambulatory Visit: Payer: Self-pay

## 2021-11-09 DIAGNOSIS — E890 Postprocedural hypothyroidism: Secondary | ICD-10-CM

## 2021-11-09 LAB — T4, FREE: Free T4: 1.05 ng/dL (ref 0.60–1.60)

## 2021-11-09 LAB — TSH: TSH: 0.62 u[IU]/mL (ref 0.35–5.50)

## 2021-11-09 LAB — T3, FREE: T3, Free: 3.8 pg/mL (ref 2.3–4.2)

## 2021-11-12 ENCOUNTER — Other Ambulatory Visit: Payer: Medicare Other

## 2021-11-14 ENCOUNTER — Ambulatory Visit: Payer: Medicare Other | Admitting: Endocrinology

## 2021-11-14 ENCOUNTER — Other Ambulatory Visit: Payer: Self-pay

## 2021-11-14 ENCOUNTER — Encounter: Payer: Self-pay | Admitting: Endocrinology

## 2021-11-14 VITALS — BP 140/88 | HR 78 | Ht 65.5 in | Wt 182.2 lb

## 2021-11-14 DIAGNOSIS — E890 Postprocedural hypothyroidism: Secondary | ICD-10-CM | POA: Diagnosis not present

## 2021-11-14 DIAGNOSIS — I10 Essential (primary) hypertension: Secondary | ICD-10-CM | POA: Diagnosis not present

## 2021-11-14 NOTE — Progress Notes (Signed)
Patient ID: [name], female   DOB: 01-03-1951, 70 y.o.   MRN: [id number]            Referring Provider: Dr. Thea Alken  Reason for Appointment:  Hypothyroidism, f/u  visit    History of Present Illness:   Hypothyroidism was first diagnosed in 2006  At the time of diagnosis patient had radioactive iron treatment for reportedly Graves' disease  With her Graves' disease she had symptoms of palpitations and increased blood pressure as well as some chest discomfort and was diagnosed during her hospitalization  Initially may have been given PTU but subsequently had I-131 treatment resulting in hypothyroidism  The patient has been treated with levothyroxine and liothyronine by her previous endocrinologist since she was diagnosed as hypothyroid Although she had been generally taking the same regimen for years she has had difficulty maintaining normal levels for the last 3 years or so She thinks this was subsequent to a respiratory viral infection  In early 2022 she was taking 75 mcg of levothyroxine along with 12.5 mcg of liothyronine However because of persistently higher TSH levels dose of levothyroxine was gradually increased all the way up to 125 mcg in March However at that time her TSH was high she was feeling good with normal energy level When her TSH was slightly low in 5/22 her dose of levothyroxine was reduced to 100 mcg; lab work about a month later showed continuously suppressed TSH She says that with increasing her levothyroxine dose she has had more feeling of fatigue and sometimes somnolence  RECENT history:  On her initial visit she was complaining of fatigue and some limitations but not palpitations  With her TSH being low and free T3 level high her dose was adjusted as follows Instead of 12.5 Cytomel daily she was told to take 5 mcg tablets, 1-1/2 daily before breakfast Also the timing of her liothyronine was changed to before breakfast instead of suppertime with her  vitamin  However on her last visit she was having more fatigue sweating and and somnolence Because of her T3 level being high she was told to stop liothyronine completely About 3 weeks ago she started feeling her energy level come back and she has not had any excessive somnolence or weakness, also has somewhat decreased sweating, no shakiness Weight has come back up  She has been taking levothyroxine before breakfast consistently Currently only taking 100 mcg levothyroxine  Patient's weight history is as follows:  Wt Readings from Last 3 Encounters:  11/14/21 182 lb 3.2 oz (82.6 kg)  10/24/21 177 lb (80.3 kg)  10/11/21 174 lb 6.4 oz (79.1 kg)    Thyroid function results have been as follows:  Lab Results  Component Value Date   TSH 0.62 11/09/2021   TSH 0.03 (L) 10/05/2021   TSH 0.02 (L) 08/29/2021   TSH 0.09 (L) 05/30/2021   FREET4 1.05 11/09/2021   FREET4 0.89 10/05/2021   FREET4 1.07 08/29/2021   FREET4 1.19 10/15/2018   T3FREE 3.8 11/09/2021   T3FREE 4.5 (H) 08/29/2021   T3FREE 3.7 05/30/2021     Past Medical History:  Diagnosis Date   Allergic rhinitis    Allergy    Anemia    b12 def.pt. unaware   Asthma    controlled w/ singulair   Herpes simplex without mention of complication    controlled w/ meds (no current fever blisters)   History of hiatal hernia    History of kidney stones    HLD (  hyperlipidemia)    borderline-  no meds   HTN (hypertension)    echo- 2006   Hyperthyroidism hx ptu 2006   takes synthroid; had Graves disease, was treated for that- now thyroid doesn't work   Leukocytosis, unspecified hx of 10 yrs ago   no problem since   Migraine    Mixed incontinence    urge and stress incontinence   Renal cyst right    pt is unsure of this, has had kidney stones   Ureteral calculi right    s/p laser  litho w/ stone extraction 01-28-11    Past Surgical History:  Procedure Laterality Date   BIOPSY  10/16/2018   Procedure: BIOPSY;  Surgeon:  Milus Banister, MD;  Location: [facility] ENDOSCOPY;  Service: Endoscopy;;   [place]   fusion c4-6   CERVICAL FUSION  2005   fusion c6-7 and plate removal T0-6   COLONOSCOPY     CYSTOSCOPY W/ RETROGRADES  10/21/2011   Procedure: CYSTOSCOPY WITH RETROGRADE PYELOGRAM;  Surgeon: Ailene Rud, MD;  Location: [place];  Service: Urology;  Laterality: Right;  CYSTOSCOPY RIGHT RETROGRADE, PYELOGRAM  URETEROSCOPY WITH HOLMIUM LASER AND STONE EXTRACTION   CYSTOSCOPY/RETROGRADE/URETEROSCOPY/STONE EXTRACTION WITH BASKET  01-28-11   right    ESOPHAGOGASTRODUODENOSCOPY (EGD) WITH PROPOFOL N/A 10/16/2018   Procedure: ESOPHAGOGASTRODUODENOSCOPY (EGD) WITH PROPOFOL;  Surgeon: Milus Banister, MD;  Location[facility] ENDOSCOPY;  Service: Endoscopy;  Laterality: N/A;   REDUCTION MAMMAPLASTY Bilateral 1995   TONSILLECTOMY  1960   VAGINAL HYSTERECTOMY  1987   fibroids/anemia    Family History  Problem Relation Age of Onset   Osteoporosis Mother    Diabetes Mother    Heart disease Father    Heart failure Father    Diabetes Other        Grandfather   Coronary artery disease Other        Grandmother   Breast cancer Neg Hx    Colon cancer Neg Hx    Esophageal cancer Neg Hx    Stomach cancer Neg Hx    Rectal cancer Neg Hx    Thyroid disease Neg Hx     Social History:  reports that she has never smoked. She has never used smokeless tobacco. She reports that she does not drink alcohol and does not use drugs.  Allergies:  Allergies  Allergen Reactions   Contrast Media [Iodinated Diagnostic Agents] Anaphylaxis   Shellfish-Derived Products Anaphylaxis   Septra [Sulfamethoxazole-Trimethoprim] Nausea Only    Allergies as of 11/14/2021       Reactions   Contrast Media [iodinated Diagnostic Agents] Anaphylaxis   Shellfish-derived Products Anaphylaxis   Septra [sulfamethoxazole-trimethoprim] Nausea Only        Medication List         Accurate as of November 14, 2021 12:41 PM. If you have any questions, ask your nurse or doctor.          STOP taking these medications    acyclovir 400 MG tablet Commonly known as: ZOVIRAX Stopped by: Elayne Snare, MD   liothyronine 5 MCG tablet Commonly known as: CYTOMEL Stopped by: Elayne Snare, MD       TAKE these medications    acetaminophen 500 MG tablet Commonly known as: TYLENOL Take 1,000 mg by mouth every 6 (six) hours as needed for moderate pain.   amLODipine 5 MG tablet Commonly known as: NORVASC Take 1 tablet (5 mg total) by mouth daily.  B-12 PO Take 1 capsule by mouth daily.   CALCIUM 500/D PO Take 1 tablet by mouth daily.   fenofibrate 54 MG tablet TAKE 1 TABLET BY MOUTH  DAILY   levothyroxine 100 MCG tablet Commonly known as: SYNTHROID Take 1 tablet (100 mcg total) by mouth daily.   montelukast 10 MG tablet Commonly known as: SINGULAIR Take 1 tablet (10 mg total) by mouth daily.   multivitamin tablet Take 1 tablet by mouth daily.   Myrbetriq 50 MG Tb24 tablet Generic drug: mirabegron ER TAKE 1 TABLET BY MOUTH  DAILY   propranolol ER 120 MG 24 hr capsule Commonly known as: INDERAL LA Take 1 capsule (120 mg total) by mouth daily.   SUMAtriptan 100 MG tablet Commonly known as: IMITREX TAKE 1 TABLET FOR HEADACHE MAY REPEAT ONCE IN 2 HOURS IF NEEDED. MAXIMUM OF 2    TABLETS IN ONE DAY. What changed:  how much to take how to take this when to take this reasons to take this additional instructions           Review of Systems  HYPERTENSION: Now taking amlodipine 5 mg and also, previously only on propranolol She says her blood pressure is about 130/88 at home and she has not followed up with her PCP      BP Readings from Last 3 Encounters:  11/14/21 140/88  10/24/21 (!) 141/100  10/11/21 (!) 160/110          She has hydronephrosis and right atrophic kidney, stent placement pending  Lab Results  Component Value Date    CREATININE 1.10 (H) 10/24/2021   CREATININE 1.49 (H) 04/25/2021   CREATININE 1.48 (H) 01/30/2021      Examination:    BP 140/88   Pulse 78   Ht 5' 5.5" (1.664 m)   Wt 182 lb 3.2 oz (82.6 kg)   SpO2 97%   BMI 29.86 kg/m   GENERAL:  No tremor present  Biceps reflexes are normal No ankle edema present    Assessment:  HYPOTHYROIDISM, post radioactive iodine treatment for Graves' disease several years ago  Current regimen is 100 mcg of levothyroxine and 7.5 mcg of liothyronine  She is complaining of fatigue and sweating as well as some weight loss and she feels like her thyroid levels are high Although her T3 is low her free T4 is normal and slightly lower than before  HYPERTENSION: Her blood pressure appears to be higher than usual Not clear if her renal function has changed recently, followed by urology now  PLAN:    Continue 100 mcg levothyroxine as monotherapy Follow-up in 3 months again  She will need to follow-up for her blood pressure with her PCP In the meantime she will go up to 10 mg amlodipine  Discussed trying to get her diastolic down closer to [place] 11/14/2021, 12:41 PM    Note: This office note was prepared with Dragon voice recognition system technology. Any transcriptional errors that result from this process are unintentional.

## 2021-11-14 NOTE — Patient Instructions (Addendum)
Amlodipine 10mg  daily  See Dr Darene Lamer

## 2021-11-16 ENCOUNTER — Ambulatory Visit (HOSPITAL_COMMUNITY)
Admission: RE | Admit: 2021-11-16 | Discharge: 2021-11-16 | Disposition: A | Discharge status: Home / Self Care | Payer: Medicare Other | Source: Ambulatory Visit | Attending: Urology | Admitting: Urology

## 2021-11-16 ENCOUNTER — Ambulatory Visit (HOSPITAL_COMMUNITY): Payer: Medicare Other | Admitting: Physician Assistant

## 2021-11-16 ENCOUNTER — Encounter (HOSPITAL_COMMUNITY)
Admission: RE | Disposition: A | Discharge status: Home / Self Care | Payer: Self-pay | Source: Ambulatory Visit | Attending: Urology

## 2021-11-16 ENCOUNTER — Ambulatory Visit (HOSPITAL_COMMUNITY): Payer: Medicare Other | Admitting: Anesthesiology

## 2021-11-16 ENCOUNTER — Ambulatory Visit (HOSPITAL_COMMUNITY): Payer: Medicare Other

## 2021-11-16 ENCOUNTER — Encounter (HOSPITAL_COMMUNITY): Payer: Self-pay | Admitting: Urology

## 2021-11-16 DIAGNOSIS — J45909 Unspecified asthma, uncomplicated: Secondary | ICD-10-CM | POA: Insufficient documentation

## 2021-11-16 DIAGNOSIS — E0500 Thyrotoxicosis with diffuse goiter without thyrotoxic crisis or storm: Secondary | ICD-10-CM | POA: Diagnosis not present

## 2021-11-16 DIAGNOSIS — N201 Calculus of ureter: Secondary | ICD-10-CM

## 2021-11-16 DIAGNOSIS — I10 Essential (primary) hypertension: Secondary | ICD-10-CM | POA: Diagnosis not present

## 2021-11-16 DIAGNOSIS — Z79890 Hormone replacement therapy (postmenopausal): Secondary | ICD-10-CM | POA: Insufficient documentation

## 2021-11-16 DIAGNOSIS — Z8711 Personal history of peptic ulcer disease: Secondary | ICD-10-CM | POA: Diagnosis not present

## 2021-11-16 DIAGNOSIS — K449 Diaphragmatic hernia without obstruction or gangrene: Secondary | ICD-10-CM | POA: Insufficient documentation

## 2021-11-16 DIAGNOSIS — N261 Atrophy of kidney (terminal): Secondary | ICD-10-CM | POA: Insufficient documentation

## 2021-11-16 DIAGNOSIS — N132 Hydronephrosis with renal and ureteral calculous obstruction: Secondary | ICD-10-CM | POA: Diagnosis not present

## 2021-11-16 DIAGNOSIS — Z923 Personal history of irradiation: Secondary | ICD-10-CM | POA: Insufficient documentation

## 2021-11-16 HISTORY — PX: CYSTOSCOPY/URETEROSCOPY/HOLMIUM LASER/STENT PLACEMENT: SHX6546

## 2021-11-16 SURGERY — CYSTOSCOPY/URETEROSCOPY/HOLMIUM LASER/STENT PLACEMENT
Anesthesia: General | Site: Ureter | Laterality: Right

## 2021-11-16 MED ORDER — ACETAMINOPHEN 10 MG/ML IV SOLN: 1000.0000 mg | Freq: Once | INTRAVENOUS | Status: DC | PRN

## 2021-11-16 MED ORDER — SODIUM CHLORIDE 0.9 % IR SOLN
Status: DC | PRN
  Administered 2021-11-16: 6000 mL via INTRAVESICAL

## 2021-11-16 MED ORDER — FENTANYL CITRATE (PF) 100 MCG/2ML IJ SOLN
INTRAMUSCULAR | Status: DC | PRN
  Administered 2021-11-16 (×4): 25 ug via INTRAVENOUS

## 2021-11-16 MED ORDER — ACETAMINOPHEN 325 MG PO TABS: 325.0000 mg | ORAL_TABLET | ORAL | Status: DC | PRN

## 2021-11-16 MED ORDER — PHENYLEPHRINE HCL (PRESSORS) 10 MG/ML IV SOLN
INTRAVENOUS | Status: DC | PRN
  Administered 2021-11-16 (×2): 40 ug via INTRAVENOUS

## 2021-11-16 MED ORDER — FENTANYL CITRATE (PF) 100 MCG/2ML IJ SOLN
INTRAMUSCULAR | Status: AC
  Filled 2021-11-16: qty 2

## 2021-11-16 MED ORDER — ORAL CARE MOUTH RINSE
15.0000 mL | Freq: Once | OROMUCOSAL | Status: AC
  Administered 2021-11-16: 15 mL via OROMUCOSAL

## 2021-11-16 MED ORDER — DOCUSATE SODIUM 100 MG PO CAPS: 100.0000 mg | ORAL_CAPSULE | Freq: Every day | ORAL | 0 refills | Status: DC | PRN

## 2021-11-16 MED ORDER — CEFAZOLIN SODIUM-DEXTROSE 2-4 GM/100ML-% IV SOLN
2.0000 g | Freq: Once | INTRAVENOUS | Status: AC
  Administered 2021-11-16: 2 g via INTRAVENOUS
  Filled 2021-11-16: qty 100

## 2021-11-16 MED ORDER — CEPHALEXIN 500 MG PO CAPS: 500.0000 mg | ORAL_CAPSULE | Freq: Two times a day (BID) | ORAL | 0 refills | Status: AC

## 2021-11-16 MED ORDER — IOHEXOL 300 MG/ML  SOLN
INTRAMUSCULAR | Status: DC | PRN
  Administered 2021-11-16: 6 mL

## 2021-11-16 MED ORDER — LIDOCAINE HCL (CARDIAC) PF 100 MG/5ML IV SOSY
PREFILLED_SYRINGE | INTRAVENOUS | Status: DC | PRN
  Administered 2021-11-16: 80 mg via INTRAVENOUS

## 2021-11-16 MED ORDER — ACETAMINOPHEN 160 MG/5ML PO SOLN: 325.0000 mg | ORAL | Status: DC | PRN

## 2021-11-16 MED ORDER — CHLORHEXIDINE GLUCONATE 0.12 % MT SOLN: 15.0000 mL | Freq: Once | OROMUCOSAL | Status: AC

## 2021-11-16 MED ORDER — OXYCODONE-ACETAMINOPHEN 5-325 MG PO TABS: 1.0000 | ORAL_TABLET | ORAL | 0 refills | Status: DC | PRN

## 2021-11-16 MED ORDER — PROMETHAZINE HCL 25 MG/ML IJ SOLN: 6.2500 mg | INTRAMUSCULAR | Status: DC | PRN

## 2021-11-16 MED ORDER — FENTANYL CITRATE PF 50 MCG/ML IJ SOSY: 25.0000 ug | PREFILLED_SYRINGE | INTRAMUSCULAR | Status: DC | PRN

## 2021-11-16 MED ORDER — DEXAMETHASONE SODIUM PHOSPHATE 4 MG/ML IJ SOLN
INTRAMUSCULAR | Status: DC | PRN
  Administered 2021-11-16: 5 mg via INTRAVENOUS

## 2021-11-16 MED ORDER — LIDOCAINE HCL (PF) 2 % IJ SOLN
INTRAMUSCULAR | Status: AC
  Filled 2021-11-16: qty 10

## 2021-11-16 MED ORDER — ONDANSETRON HCL 4 MG/2ML IJ SOLN
INTRAMUSCULAR | Status: DC | PRN
  Administered 2021-11-16: 4 mg via INTRAVENOUS

## 2021-11-16 MED ORDER — PROPOFOL 10 MG/ML IV BOLUS
INTRAVENOUS | Status: DC | PRN
  Administered 2021-11-16: 150 mg via INTRAVENOUS
  Administered 2021-11-16: 50 mg via INTRAVENOUS

## 2021-11-16 MED ORDER — LACTATED RINGERS IV SOLN: INTRAVENOUS | Status: DC

## 2021-11-16 SURGICAL SUPPLY — 22 items
BAG URO CATCHER STRL LF (MISCELLANEOUS) ×2 IMPLANT
BASKET ZERO TIP NITINOL 2.4FR (BASKET) ×1 IMPLANT
BSKT STON RTRVL ZERO TP 2.4FR (BASKET) ×1
CATH URETERAL DUAL LUMEN 10F (MISCELLANEOUS) ×1 IMPLANT
CATH URETL OPEN 5X70 (CATHETERS) ×2 IMPLANT
CLOTH BEACON ORANGE TIMEOUT ST (SAFETY) ×2 IMPLANT
FIBER LASER MOSES 200 DFL (Laser) IMPLANT
GLOVE SURG ENC TEXT LTX SZ7 (GLOVE) ×7 IMPLANT
GOWN STRL REUS W/TWL LRG LVL3 (GOWN DISPOSABLE) ×4 IMPLANT
GUIDEWIRE STR DUAL SENSOR (WIRE) ×4 IMPLANT
GUIDEWIRE ZIPWRE .038 STRAIGHT (WIRE) IMPLANT
KIT TURNOVER KIT A (KITS) ×1 IMPLANT
LASER FIB FLEXIVA PULSE ID 365 (Laser) IMPLANT
MANIFOLD NEPTUNE II (INSTRUMENTS) ×2 IMPLANT
PACK CYSTO (CUSTOM PROCEDURE TRAY) ×2 IMPLANT
SHEATH DILATOR SET 8/10 (MISCELLANEOUS) ×1 IMPLANT
SHEATH URETERAL 12FR 45CM (SHEATH) IMPLANT
STENT URET 6FRX24 CONTOUR (STENTS) ×1 IMPLANT
TRACTIP FLEXIVA PULS ID 200XHI (Laser) IMPLANT
TRACTIP FLEXIVA PULSE ID 200 (Laser) ×2
TUBING CONNECTING 10 (TUBING) ×2 IMPLANT
TUBING UROLOGY SET (TUBING) ×2 IMPLANT

## 2021-11-16 NOTE — Anesthesia Procedure Notes (Signed)
Procedure Name: LMA Insertion Date/Time: 11/16/2021 12:15 PM Performed by: Lavina Hamman, CRNA Pre-anesthesia Checklist: Patient identified, Emergency Drugs available, Suction available and Patient being monitored Patient Re-evaluated:Patient Re-evaluated prior to induction Oxygen Delivery Method: Circle System Utilized Preoxygenation: Pre-oxygenation with 100% oxygen Induction Type: IV induction Ventilation: Mask ventilation without difficulty LMA: LMA inserted LMA Size: 4.0 Number of attempts: 1 Placement Confirmation: positive ETCO2 Tube secured with: Tape Dental Injury: Teeth and Oropharynx as per pre-operative assessment

## 2021-11-16 NOTE — Discharge Instructions (Addendum)
Activity:  You are encouraged to ambulate frequently (about every hour during waking hours) to help prevent blood clots from forming in your legs or lungs.    Diet: You should advance your diet as instructed by your physician.  It will be normal to have some bloating, nausea, and abdominal discomfort intermittently.  Prescriptions:  You will be provided a prescription for pain medication to take as needed.  If your pain is not severe enough to require the prescription pain medication, you may take extra strength Tylenol instead which will have less side effects.  You should also take a prescribed stool softener to avoid straining with bowel movements as the prescription pain medication may constipate you.  What to call us about: You should call the office [phone]) if you develop fever > 101 or develop persistent vomiting. Activity:  You are encouraged to ambulate frequently (about every hour during waking hours) to help prevent blood clots from forming in your legs or lungs.   You have a stent draining your kidney. [place] next Thursday at [place] in the clinic for stent removal.

## 2021-11-16 NOTE — Anesthesia Preprocedure Evaluation (Addendum)
Anesthesia Evaluation  Patient identified by MRN, date of birth, ID band Patient awake    Reviewed: Allergy & Precautions, NPO status , Patient's Chart, lab work & pertinent test results  Airway Mallampati: II  TM Distance: >3 FB Neck ROM: Full   Comment: Narrow palate. Dental  (+) Teeth Intact, Dental Advisory Given   Pulmonary asthma ,    breath sounds clear to auscultation       Cardiovascular hypertension, Pt. on medications  Rhythm:Regular Rate:Normal     Neuro/Psych  Headaches, negative psych ROS   GI/Hepatic Neg liver ROS, hiatal hernia, PUD,   Endo/Other  Hypothyroidism Hyperthyroidism   Renal/GU Renal disease     Musculoskeletal negative musculoskeletal ROS (+)   Abdominal Normal abdominal exam  (+)   Peds  Hematology   Anesthesia Other Findings   Reproductive/Obstetrics                            Anesthesia Physical Anesthesia Plan  ASA: 2  Anesthesia Plan: General   Post-op Pain Management:    Induction: Intravenous  PONV Risk Score and Plan: 4 or greater and Ondansetron, Dexamethasone and Treatment may vary due to age or medical condition  Airway Management Planned: LMA  Additional Equipment: None  Intra-op Plan:   Post-operative Plan: Extubation in OR  Informed Consent: I have reviewed the patients History and Physical, chart, labs and discussed the procedure including the risks, benefits and alternatives for the proposed anesthesia with the patient or authorized representative who has indicated his/her understanding and acceptance.     Dental advisory given  Plan Discussed with: CRNA  Anesthesia Plan Comments:        Anesthesia Quick Evaluation

## 2021-11-16 NOTE — Op Note (Signed)
Operative Note  Preoperative diagnosis:  1.  Right ureteral stone 2. Right hydronephrosis 3. Atrophic right kidney  Postoperative diagnosis: 1.  Impacted right ureteral stone 2. Right hydronephrosis 3. Atrophic right kidney  Procedure(s): 1.  Cystoscopy 2. Right ureteroscopy with laser lithotripsy and basket extraction of stones 3. Right retrograde pyelogram 4. Right ureteral stent placement 5. Fluoroscopy with intraoperative interpretation  Surgeon: Rexene Alberts, MD  Assistants:  None  Anesthesia:  General  Complications:  None  EBL:  Minimal  Specimens: 1. None  Drains/Catheters: 1.  Right 6Fr x 24cm ureteral stent WITHOUT a tether string  Intraoperative findings:   Cystoscopy demonstrated no suspicious bladder lesions. Bilateral ureteral orifices were in the normal orthotopic position. Right ureteroscopy demonstrated a distal right ureteral stricture at the level of the right intramural ureter as well as narrowing distal to the iliac vessels in the distal ureter.  Identified an impacted approximately 5 mm right ureteral stone at the level of the pelvic brim.  I fragmented the stone into 4 separate pieces and basket extracted this.  There is significant scar tissue in this location with stenosed urethra.  Just proximal to this, there ureter ballooned into an open collecting system.  Identified no stones within the right collecting system. Right retrograde pyelogram demonstrated severe right-sided hydroureteronephrosis to the level of the impacted right ureteral stone within the distal right ureter at the pelvic brim Successful right ureteral stent placement.  Indication:  [name] is a 70 y.o. female with a history of right ureteroscopy in 2012 for a 9 mm right ureteral stone.  CT A/P 10/02/2021 incidentally revealed an atrophic right kidney with marked right hydronephrosis in the distal 5 mm stone at the level of the right common iliac bifurcation.  There is also  found to be a 4 mm right renal stone.  Of note, she had been asymptomatic.  This likely has progressed for many years possibly since 2012 since her last intervention.  She is being brought back today for definitive treatment of her right ureteral and possible renal stone.  Description of procedure: After informed consent was obtained from the patient, the patient was identified and taken to the operating room and placed in the supine position.  General anesthesia was administered as well as perioperative IV antibiotics.  At the beginning of the case, a time-out was performed to properly identify the patient, the surgery to be performed, and the surgical site.  Sequential compression devices were applied to the lower extremities at the beginning of the case for DVT prophylaxis.  The patient was then placed in the dorsal lithotomy supine position, prepped and draped in sterile fashion.  Preliminary scout fluoroscopy revealed that there was a 5 mm calcification area at the right distal ureter at the pelvic brim, which corresponds to the stone found on the preoperative CT scan. We then passed the 21-French rigid cystoscope through the urethra and into the bladder under vision without any difficulty , noting a normal urethra without strictures.  A systematic evaluation of the bladder revealed no evidence of any suspicious bladder lesions.  Ureteral orifices were in normal position.    Under cystoscopic and flouroscopic guidance, we cannulated the right ureteral orifice with a 5-French open-ended ureteral catheter and a gentle retrograde pyelogram was performed, revealing significant narrowing within the distal right ureter up to the level of the expected site of impaction at the pelvic brim.  Proximal to this, there is significant hydronephrosis all the way to the kidney.  I attempted to pass a 0.03 sensor wire through this preference open-ended catheter however this would not advance beyond the ureter.  Elected  to place a semirigid ureteroscope and identified a very tight narrowing within the intramural ureter.  I was able to navigate a wire beyond this up to the level of the renal pelvis.  I did pass the ureteroscope approximately 5 cm further where I encountered severe narrowing of the ureter that would not accommodate the scope.  I elected to pass an 8 French/10 French coaxial ureteral dilator to dilate this area.  Following this, was able to pass a semirigid ureteroscope beyond the level of impaction and identified the stone near the level of the pelvic brim.  Using a 200 m holmium laser fiber, I was able to fragment the stone into 4 small pieces.  I then used a 0 tip basket to basket extract the stones and these were placed into the bladder.  I then advanced the ureteroscope this approximately could into the level of the proximal right ureter and encountered no further stones.  I withdrew the scope and noted evidence of scar tissue at the site of prior impaction that extended for course of approximately 3 cm.  I then passed a separate wire and attempted to pass a dual-lumen flexible ureteroscope however this met resistance at the area of impaction.  I then was able to pass a single-lumen flexible ureteroscope over this previously placed a second wire into the renal pelvis.  I then surveyed the kidney however I was unable to encounter any further stones.  The kidney was essentially a water sac with no real calyces and rather everything was hydronephrotic.  We then withdrew the ureteroscope back down the ureter along with the access sheath, noting no evidence of any stones along the course of the ureter.  Prior to removing the ureteroscope, we did pass the Glidewire back up to the ureter to the renal pelvis.  Once the ureteroscope was removed, the Glidewire was backloaded through the rigid cystoscope, which was then advanced down the urethra and into the bladder. We then used the Glidewire under direct vision through  the rigid cystoscope and under fluoroscopic guidance and passed up a 6-French, 24 cm double-pigtail ureteral stent up ureter, making sure that the proximal and distal ends coiled within the kidney and bladder respectively.  We did not leave a tether string.  The cystoscope was then advanced back into the bladder under vision.  We were able to see the distal stent coiling nicely within the bladder.  The bladder was then emptied with irrigation solution.  The cystoscope was then removed.    The patient tolerated the procedure well and there was no complication. Patient was awoken from anesthesia and taken to the recovery room in stable condition. I was present and scrubbed for the entirety of the case.  Plan:  Patient will be discharged home.  Follow up with me in 7 to 10 days for stent removal in the office.  I discussed the operative findings with her husband.   Matt [facility]  Pager: [phone]

## 2021-11-16 NOTE — H&P (Signed)
CC/HPI: [name] is a 70 year old female seen in consultation today for a right ureteral and renal stone, right hydronephrosis and atrophic right kidney.   1. Urolithiasis:  -Pre-2022: S/p R URS/LL 57mm R ureteral stone 01/2011 and 10/2011  -CT 10/02/2021 with atrophic right kidney with marked right hydronephrosis and distal 5 mm stone at the level of the right common iliac bifurcation as well as a 4 mm right renal stone. There is a punctate left upper pole stone.  -She denies abdominal pain or flank pain. She denies fevers or chills. She denies dysuria.   #2. Atrophic right kidney: CT A/P 10/02/2021 with atrophic right kidney with marked right hydronephrosis. This is chronic and likely due to presence of an obstructing asymptomatic ureteral stone for many years. Creatinine on 09/18/2021 was normal at 0.9.   #3. Right hydronephrosis: CT A/P 10/02/2021 with atrophic right kidney with marked right hydronephrosis. This is chronic and likely due to presence of an obstructing asymptomatic ureteral stone for many years. Creatinine on 09/18/2021 was normal at 0.9.   She does have a history of incontinence and prolapse and follows with Dr. Matilde Sprang.   She has a past medical history of Graves' disease status post radiation many years ago and is on thyroid medication.     ALLERGIES: Iodinated Contrast Media - Oral and IV Dye Iodine SOLN Shellfish Sulfa    MEDICATIONS: Myrbetriq 50 mg tablet, extended release 24 hr  Cytomel 25 mcg tablet  Fenofibrate 54 mg tablet  Imitrex  Propranolol Hcl Er 120 mg capsule, extended release 24hr  Singulair 10 mg tablet  Synthroid 100 mcg tablet     GU PSH: Cysto Uretero Lithotripsy - 2012, 2012 Cystoscopy Insert Stent - 2012, 2012 Hysterectomy Unilat SO - 2012       PSH Notes: Cystoscopy With Ureteroscopy With Lithotripsy, Cystoscopy With Insertion Of Ureteral Stent Right, Cystoscopy With Ureteroscopy With Lithotripsy, Cystoscopy With Insertion Of  Ureteral Stent Left, Tonsillectomy, Hysterectomy, Back Surgery, Back Surgery   NON-GU PSH: Remove Tonsils - 2012     GU PMH: Hydronephrosis Unspec - 10/02/2021, - 09/18/2021, Hydronephrosis, right, - 2014 Mixed incontinence - 2019 Nocturia - 2019 Urinary Frequency - 2019 Acute Cystitis/UTI, Acute cystitis without hematuria - 2014 Renal calculus, Nephrolithiasis - 2014 Stress Incontinence, Female stress incontinence - 2014 Ureteral calculus, Calculus of distal right ureter - 2014 Urge incontinence, Urge incontinence of urine - 2014 Urinary Tract Inf, Unspec site, Pyuria - 2014      PMH Notes:  2011-01-01 11:24:42 - Note: Graves' Disease   NON-GU PMH: Hyperthyroidism, Hyperthyroidism - 2014 Personal history of other diseases of the circulatory system, History of hypertension - 2014 Unspecified atrial fibrillation, Atrial Fibrillation - 2014 Asthma Encounter for general adult medical examination without abnormal findings, Encounter for preventive health examination GERD Heartburn Hypercholesterolemia Hypertension    FAMILY HISTORY: 1 son - Son Acute Myocardial Infarction - Father Dementia - Mother Diabetes - Mother Family Health Status - Mother's Age - Runs In Family Family Health Status Number - Runs In Family Father Deceased At [employer] ___ - Runs In Family   SOCIAL HISTORY: Marital Status: Married Current Smoking Status: Patient has never smoked.   Tobacco Use Assessment Completed: Used Tobacco in last 30 days? Does not use smokeless tobacco. Has never drank.  Drinks 1 caffeinated drink per day. Patient's occupation is/was Retired.     Notes: Never A Smoker, Alcohol Use, Occupation:, Marital History - Currently Married, Tobacco Use, Caffeine Use   REVIEW OF SYSTEMS:  GU Review Female:   Patient denies frequent urination, hard to postpone urination, burning /pain with urination, get up at night to urinate, leakage of urine, stream starts and stops, trouble starting  your stream, have to strain to urinate, and being pregnant.  Gastrointestinal (Upper):   Patient denies nausea, vomiting, and indigestion/ heartburn.  Gastrointestinal (Lower):   Patient denies diarrhea and constipation.  Constitutional:   Patient denies fever, night sweats, weight loss, and fatigue.  Skin:   Patient denies skin rash/ lesion and itching.  Eyes:   Patient denies blurred vision and double vision.  Ears/ Nose/ Throat:   Patient denies sore throat and sinus problems.  Hematologic/Lymphatic:   Patient denies swollen glands and easy bruising.  Cardiovascular:   Patient denies leg swelling and chest pains.  Respiratory:   Patient denies cough and shortness of breath.  Endocrine:   Patient denies excessive thirst.  Musculoskeletal:   Patient denies back pain and joint pain.  Neurological:   Patient denies dizziness and headaches.  Psychologic:   Patient denies depression and anxiety.   VITAL SIGNS: None   MULTI-SYSTEM PHYSICAL EXAMINATION:    Constitutional: Well-nourished. No physical deformities. Normally developed. Good grooming.  Respiratory: No labored breathing, no use of accessory muscles.   Cardiovascular: Normal temperature, normal extremity pulses, no swelling, no varicosities.  Gastrointestinal: No mass, no tenderness, no rigidity, non obese abdomen. No CVA tenderness     Complexity of Data:  Source Of History:  Patient, Medical Record Summary  Records Review:   Previous Doctor Records, Previous Hospital Records, Previous Patient Records  Urine Test Review:   Urinalysis  X-Ray Review: C.T. Abdomen/Pelvis: Reviewed Films. Reviewed Report. Discussed With Patient.    Notes:                     CLINICAL DATA: Evaluate hydronephrosis.   EXAM:  CT ABDOMEN AND PELVIS WITHOUT CONTRAST   TECHNIQUE:  Multidetector CT imaging of the abdomen and pelvis was performed  following the standard protocol without IV contrast.   COMPARISON: U/S renal 09/12/2021 and CT AP  08/25/2021   FINDINGS:  Lower chest: No acute abnormality.   Hepatobiliary: No focal liver abnormality is seen. No gallstones,  gallbladder wall thickening, or biliary dilatation.   Pancreas: Unremarkable. No pancreatic ductal dilatation or  surrounding inflammatory changes.   Spleen: Normal in size without focal abnormality.   Adrenals/Urinary Tract: Normal adrenal glands. Lobular contour the  left kidney likely reflects underlying scarring. Punctate stone  noted within upper pole of the left kidney, image 93/601. No left  kidney mass or hydronephrosis.   There is marked right hydronephrosis and hydroureter. Diffuse right  renal cortical volume loss is noted suggesting chronic obstructive  uropathy. Nonobstructing stone within the dependent portion of the  dilated right renal collecting system measures 4 mm. Dilated right  hydroureter extends to the level of the right common iliac  bifurcation. Here, there is a calcification measuring 5 x 3 mm,  image 87/601. A normal caliber ureter is noted beyond this point to  the level of the bladder. There is no left-sided hydronephrosis or  hydroureter. Urinary bladder is unremarkable.   Stomach/Bowel: Small hiatal hernia. Stomach is otherwise  unremarkable. No bowel wall thickening, inflammation, or distension.  Large stool burden noted within the rectum.   Vascular/Lymphatic: Aortic atherosclerosis. No aneurysm. No  abdominopelvic adenopathy.   Reproductive: Status post hysterectomy. No adnexal masses.   Other: No free fluid or fluid collections. Small fat  containing  umbilical hernia.   Musculoskeletal: No acute or significant osseous findings. There is  a 3 mm anterolisthesis of L3 on L4. Marked degenerative disc disease  noted at L3-4 and L4-5.   IMPRESSION:  1. Marked right hydronephrosis and hydroureter with diffuse right  renal cortical thinning. Dilated right hydroureter extends to the  level of the right common iliac  bifurcation where there is a 5 x 3  mm calcification. A normal caliber ureter is noted beyond this point  up to the level of the bladder.  2. Large stool burden noted within the rectum. Correlate for any  clinical signs or symptoms of constipation.  3. Lumbar spondylosis.  4. Aortic Atherosclerosis (ICD10-I70.0).    Electronically Signed  By: Kerby Moors M.D.  On: 10/03/2021 12:47   PROCEDURES:          Urinalysis w/Scope Dipstick Dipstick Cont'd Micro  Color: Yellow Bilirubin: Neg mg/dL WBC/hpf: 6 - 10/hpf  Appearance: Clear Ketones: Neg mg/dL RBC/hpf: 0 - 2/hpf  Specific Gravity: 1.025 Blood: Neg ery/uL Bacteria: Few (10-25/hpf)  pH: 6.0 Protein: 1+ mg/dL Cystals: NS (Not Seen)  Glucose: Neg mg/dL Urobilinogen: 0.2 mg/dL Casts: NS (Not Seen)    Nitrites: Neg Trichomonas: Not Present    Leukocyte Esterase: 2+ leu/uL Mucous: Not Present      Epithelial Cells: NS (Not Seen)      Yeast: NS (Not Seen)      Sperm: Not Present    Notes: qns to spin    ASSESSMENT:      ICD-10 Details  1 GU:   Hydronephrosis Unspec - N13.30   2   Renal and ureteral calculus - N20.2   3   Atrophy of kidney - N26.1    PLAN:           Orders Labs CULTURE, URINE          Document Letter(s):  Created for Patient: Clinical Summary         Notes:   1. Urolithiasis:  -CT 10/02/2021 with atrophic right kidney with marked right hydronephrosis and distal 5 mm stone at the level of the right common iliac bifurcation as well as a 4 mm right renal stone. There is a punctate left upper pole stone.  -Recommend cystoscopy, right retrograde pyelogram, right ureteroscopy with laser lithotripsy. Recent events discussed. We will send preoperative urine for culture today. Discussed hypertension as below   #2. Atrophic right kidney: CT A/P 10/02/2021 with atrophic right kidney with marked right hydronephrosis. This is chronic and likely due to presence of an obstructing asymptomatic ureteral stone for many  years. Creatinine on 09/18/2021 was normal at 0.9.  -We discussed that no intervention is needed including simple nephrectomy and when she develops recurrent urinary tract infections or pain. If she develops recurrent infections, we can consider simple nephrectomy.   #3. Right hydronephrosis: CT A/P 10/02/2021 with atrophic right kidney with marked right hydronephrosis. This is chronic and likely due to presence of an obstructing asymptomatic ureteral stone for many years. Creatinine on 09/18/2021 was normal at 0.9.  -We discussed that no intervention is needed including simple nephrectomy and when she develops recurrent urinary tract infections or pain. If she develops recurrent infections, we can consider simple nephrectomy.   Urology Preoperative H&P   Chief Complaint: Right ureteral and renal stone  History of Present Illness: [name] is a 70 y.o. female with a right ureteral and renal stone here for cysto, R URS/LL, R RPG, R stent.  Denies fevers, chills or dysuria. Preop Ucx NG.    Past Medical History:  Diagnosis Date   Allergic rhinitis    Allergy    Anemia    b12 def.pt. unaware   Asthma    controlled w/ singulair   Herpes simplex without mention of complication    controlled w/ meds (no current fever blisters)   History of hiatal hernia    History of kidney stones    HLD (hyperlipidemia)    borderline-  no meds   HTN (hypertension)    echo- 2006   Hyperthyroidism hx ptu 2006   takes synthroid; had Graves disease, was treated for that- now thyroid doesn't work   Leukocytosis, unspecified hx of 10 yrs ago   no problem since   Migraine    Mixed incontinence    urge and stress incontinence   Renal cyst right    pt is unsure of this, has had kidney stones   Ureteral calculi right    s/p laser  litho w/ stone extraction 01-28-11    Past Surgical History:  Procedure Laterality Date   BIOPSY  10/16/2018   Procedure: BIOPSY;  Surgeon: Milus Banister, MD;   Location: [facility];  Service: Endoscopy;;   [place]   fusion c4-6   CERVICAL FUSION  2005   fusion c6-7 and plate removal S3-4   COLONOSCOPY     CYSTOSCOPY W/ RETROGRADES  10/21/2011   Procedure: CYSTOSCOPY WITH RETROGRADE PYELOGRAM;  Surgeon: Ailene Rud, MD;  Location: [facility];  Service: Urology;  Laterality: Right;  CYSTOSCOPY RIGHT RETROGRADE, PYELOGRAM  URETEROSCOPY WITH HOLMIUM LASER AND STONE EXTRACTION   CYSTOSCOPY/RETROGRADE/URETEROSCOPY/STONE EXTRACTION WITH BASKET  01-28-11   right    ESOPHAGOGASTRODUODENOSCOPY (EGD) WITH PROPOFOL N/A 10/16/2018   Procedure: ESOPHAGOGASTRODUODENOSCOPY (EGD) WITH PROPOFOL;  Surgeon: Milus Banister, MD;  Location: [facility] ENDOSCOPY;  Service: Endoscopy;  Laterality: N/A;   REDUCTION MAMMAPLASTY Bilateral [place]   fibroids/anemia    Allergies:  Allergies  Allergen Reactions   Contrast Media [Iodinated Diagnostic Agents] Anaphylaxis   Shellfish-Derived Products Anaphylaxis   Septra [Sulfamethoxazole-Trimethoprim] Nausea Only    Family History  Problem Relation Age of Onset   Osteoporosis Mother    Diabetes Mother    Heart disease Father    Heart failure Father    Diabetes Other        Grandfather   Coronary artery disease Other        Grandmother   Breast cancer Neg Hx    Colon cancer Neg Hx    Esophageal cancer Neg Hx    Stomach cancer Neg Hx    Rectal cancer Neg Hx    Thyroid disease Neg Hx     Social History:  reports that she has never smoked. She has never used smokeless tobacco. She reports that she does not drink alcohol and does not use drugs.  ROS: A complete review of systems was performed.  All systems are negative except for pertinent findings as noted.  Physical Exam:  Vital signs in last 24 hours: Temp:  [98.3 F (36.8 C)] 98.3 F (36.8 C) (12/09 1012) Pulse Rate:  [69] 69 (12/09  1012) Resp:  [18] 18 (12/09 1012) BP: (157)/(89) 157/89 (12/09 1014) SpO2:  [98 %] 98 % (12/09 1012) Constitutional:  Alert and oriented, No acute distress Cardiovascular: Regular rate and rhythm Respiratory: Normal  respiratory effort, Lungs clear bilaterally GI: Abdomen is soft, nontender, nondistended, no abdominal masses GU: No CVA tenderness Lymphatic: No lymphadenopathy Neurologic: Grossly intact, no focal deficits Psychiatric: Normal mood and affect  Laboratory Data:  No results for input(s): WBC, HGB, HCT, PLT in the last 72 hours.  No results for input(s): NA, K, CL, GLUCOSE, BUN, CALCIUM, CREATININE in the last 72 hours.  Invalid input(s): CO3   No results found for this or any previous visit (from the past 24 hour(s)). No results found for this or any previous visit (from the past 240 hour(s)).  Renal Function: No results for input(s): CREATININE in the last 168 hours. CrCl cannot be calculated (Patient's most recent lab result is older than the maximum 21 days allowed.).  Radiologic Imaging: No results found.  I independently reviewed the above imaging studies.  Assessment and Plan Glinda B Norton is a 70 y.o. female with right ureteral and renal stone here for cysto, R URS/LL, R RPG, R stent.  -The risks, benefits and alternatives of cystoscopy with, R URS/LL, R RPG, R stent placement was discussed with the patient.  Risks include, but are not limited to: bleeding, urinary tract infection, ureteral injury, ureteral stricture disease, chronic pain, urinary symptoms, bladder injury, stent migration, the need for nephrostomy tube placement, MI, CVA, DVT, PE and the inherent risks with general anesthesia.  The patient voices understanding and wishes to proceed.    Matt R. Vijay Durflinger MD 11/16/2021, 11:01 AM  [facility] Pager: [phone]): [phone]

## 2021-11-16 NOTE — Transfer of Care (Signed)
Immediate Anesthesia Transfer of Care Note  Patient: [name]  Procedure(s) Performed: Procedure(s): CYSTOSCOPY/RETROGRADE/URETEROSCOPY/HOLMIUM LASER/STENT PLACEMENT (Right)  Patient Location: PACU  Anesthesia Type:General  Level of Consciousness:  sedated, patient cooperative and responds to stimulation  Airway & Oxygen Therapy:Patient Spontanous Breathing and Patient connected to face mask oxgen  Post-op Assessment:  Report given to PACU RN and Post -op Vital signs reviewed and stable  Post vital signs:  Reviewed and stable  Last Vitals:  Vitals:   11/16/21 1012 11/16/21 1014  BP:  (!) 157/89  Pulse: 69   Resp: 18   Temp: 36.8 C   SpO2: 92%     Complications: No apparent anesthesia complications

## 2021-11-16 NOTE — Anesthesia Postprocedure Evaluation (Signed)
Anesthesia Post Note  Patient: [name]  Procedure(s) Performed: CYSTOSCOPY/RETROGRADE/URETEROSCOPY/HOLMIUM LASER/STENT PLACEMENT (Right: Ureter)     Patient location during evaluation: PACU Anesthesia Type: General Level of consciousness: awake and alert Pain management: pain level controlled Vital Signs Assessment: post-procedure vital signs reviewed and stable Respiratory status: spontaneous breathing, nonlabored ventilation, respiratory function stable and patient connected to nasal cannula oxygen Cardiovascular status: blood pressure returned to baseline and stable Postop Assessment: no apparent nausea or vomiting Anesthetic complications: no   No notable events documented.  Last Vitals:  Vitals:   11/16/21 1412 11/16/21 1415  BP: (!) 154/75   Pulse: (!) 59   Resp: 16   Temp: 36.6 C   SpO2: 100% 94%    Last Pain:  Vitals:   11/16/21 1412  TempSrc:   PainSc: 0-No pain                 Effie Berkshire

## 2021-11-17 ENCOUNTER — Encounter (HOSPITAL_COMMUNITY): Payer: Self-pay | Admitting: Urology

## 2021-11-22 DIAGNOSIS — N1330 Unspecified hydronephrosis: Secondary | ICD-10-CM | POA: Diagnosis not present

## 2021-11-22 DIAGNOSIS — Z87442 Personal history of urinary calculi: Secondary | ICD-10-CM | POA: Diagnosis not present

## 2021-12-17 DIAGNOSIS — N261 Atrophy of kidney (terminal): Secondary | ICD-10-CM | POA: Diagnosis not present

## 2021-12-17 DIAGNOSIS — N200 Calculus of kidney: Secondary | ICD-10-CM | POA: Diagnosis not present

## 2021-12-17 DIAGNOSIS — N1330 Unspecified hydronephrosis: Secondary | ICD-10-CM | POA: Diagnosis not present

## 2021-12-19 DIAGNOSIS — N200 Calculus of kidney: Secondary | ICD-10-CM | POA: Diagnosis not present

## 2022-01-05 ENCOUNTER — Other Ambulatory Visit: Payer: Self-pay | Admitting: Endocrinology

## 2022-01-07 ENCOUNTER — Other Ambulatory Visit: Payer: Self-pay

## 2022-01-07 DIAGNOSIS — I10 Essential (primary) hypertension: Secondary | ICD-10-CM

## 2022-01-07 MED ORDER — AMLODIPINE BESYLATE 10 MG PO TABS: 10.0000 mg | ORAL_TABLET | Freq: Every day | ORAL | 0 refills | Status: DC

## 2022-01-14 DIAGNOSIS — H6123 Impacted cerumen, bilateral: Secondary | ICD-10-CM | POA: Diagnosis not present

## 2022-01-14 DIAGNOSIS — H903 Sensorineural hearing loss, bilateral: Secondary | ICD-10-CM | POA: Diagnosis not present

## 2022-01-29 DIAGNOSIS — Z87442 Personal history of urinary calculi: Secondary | ICD-10-CM | POA: Diagnosis not present

## 2022-02-11 ENCOUNTER — Telehealth: Payer: Self-pay

## 2022-02-11 ENCOUNTER — Other Ambulatory Visit: Payer: Self-pay

## 2022-02-11 ENCOUNTER — Other Ambulatory Visit (INDEPENDENT_AMBULATORY_CARE_PROVIDER_SITE_OTHER): Payer: Medicare Other

## 2022-02-11 DIAGNOSIS — E890 Postprocedural hypothyroidism: Secondary | ICD-10-CM | POA: Diagnosis not present

## 2022-02-11 LAB — TSH: TSH: 1.63 u[IU]/mL (ref 0.35–5.50)

## 2022-02-11 LAB — T3, FREE: T3, Free: 3.1 pg/mL (ref 2.3–4.2)

## 2022-02-11 LAB — T4, FREE: Free T4: 1.17 ng/dL (ref 0.60–1.60)

## 2022-02-11 NOTE — Progress Notes (Signed)
? ? ?Chronic Care Management ?Pharmacy Assistant  ? ?Name: TERESHA HANKS  MRN: [id number] DOB: [birth year] ? ?Reason for Encounter: CCM (Hypertension Disease State) ?  ?Recent office visits:  ?None since last CCM contact ? ?Recent consult visits:  ?11/14/2021 - Elayne Snare, MD - Endocrinology - Patient presented for postablative hypothyroidism. Stop as patient is not taking: acyclovir (ZOVIRAX) 400 MG tablet and liothyronine (CYTOMEL) 5 MCG tablet.  ?10/11/2021 - Elayne Snare, MD - Endocrinology - Patient presented for postablative hypothyroidism. Start: amLODipine (NORVASC) 5 MG tablet. Stop: Liothyronine completely.  ?10/09/2021 - Rexene Alberts - Urology - Patient presented for hydronephrosis. No other information.  ?10/02/2021 - [place] - Urology - Patient presented for hydronephrosis. No other information. ?10/02/2021 - Kerby Moors - Radiology - CT Scan Abdomen and Pelvis.  ?09/18/2021 - [place] - Urology - Patient presented for hydronephrosis. No other information.  ?09/05/2021 - [facility] - Nephrology - Patient presented for chronic kidney disease. No other information.  ?08/23/2021 - Elayne Snare, MD - Endocrinology - Patient presented for postablative hypothyroidism. Abnormal Labs: "Labs show T3 is above normal and low TSH. ?Instead of 12.5 liothyronine daily she needs to now take 5 mcg tablets, 1-1/2 daily, follow-up as scheduled. Also continue same dose of levothyroxine. New prescription sent." ? ?Hospital visits:  ?11/16/2021 - [facility] - Procedure - Cystoscopy/Retrograde/Ureteroscopy/Holmium laser/stent placement. Discharged same day.  ?Start: cephALEXin (KEFLEX) 500 MG capsule ?Start: docusate sodium (COLACE) 100 MG capsule ?Start: oxyCODONE-acetaminophen (PERCOCET) 5-325 MG tablet ? ?Medications: ?Outpatient Encounter Medications as of 02/11/2022  ?Medication Sig  ? acetaminophen (TYLENOL) 500 MG tablet Take 1,000 mg by mouth every 6 (six) hours as needed for moderate pain.  ?  amLODipine (NORVASC) 10 MG tablet Take 1 tablet (10 mg total) by mouth daily.  ? amLODipine (NORVASC) 5 MG tablet Take 1 tablet (5 mg total) by mouth daily.  ? Calcium Carb-Cholecalciferol (CALCIUM 500/D PO) Take 1 tablet by mouth daily.  ? Cyanocobalamin (B-12 PO) Take 1 capsule by mouth daily.  ? docusate sodium (COLACE) 100 MG capsule Take 1 capsule (100 mg total) by mouth daily as needed for up to 30 doses.  ? fenofibrate 54 MG tablet TAKE 1 TABLET BY MOUTH  DAILY  ? levothyroxine (SYNTHROID) 100 MCG tablet Take 1 tablet (100 mcg total) by mouth daily.  ? montelukast (SINGULAIR) 10 MG tablet Take 1 tablet (10 mg total) by mouth daily.  ? Multiple Vitamin (MULTIVITAMIN) tablet Take 1 tablet by mouth daily.  ? MYRBETRIQ 50 MG TB24 tablet TAKE 1 TABLET BY MOUTH  DAILY  ? oxyCODONE-acetaminophen (PERCOCET) 5-325 MG tablet Take 1 tablet by mouth every 4 (four) hours as needed for up to 12 doses for severe pain.  ? propranolol ER (INDERAL LA) 120 MG 24 hr capsule Take 1 capsule (120 mg total) by mouth daily.  ? SUMAtriptan (IMITREX) 100 MG tablet TAKE 1 TABLET FOR HEADACHE MAY REPEAT ONCE IN 2 HOURS IF NEEDED. MAXIMUM OF 2    TABLETS IN ONE DAY. (Patient taking differently: Take 100 mg by mouth as needed for migraine or headache. TAKE 100 mg TABLET FOR HEADACHE MAY REPEAT ONCE IN 2 HOURS IF NEEDED. MAXIMUM OF 200 mg    TABLETS IN ONE DAY.)  ? ?No facility-administered encounter medications on file as of 02/11/2022.  ? ?Recent Office Vitals: ?BP Readings from Last 3 Encounters:  ?11/16/21 (!) 154/75  ?11/14/21 140/88  ?10/24/21 (!) 141/100  ? ?Pulse Readings from Last 3 Encounters:  ?11/16/21 Marland Kitchen)  59  ?11/14/21 78  ?10/24/21 92  ?  ?Wt Readings from Last 3 Encounters:  ?11/14/21 182 lb 3.2 oz (82.6 kg)  ?10/24/21 177 lb (80.3 kg)  ?10/11/21 174 lb 6.4 oz (79.1 kg)  ?  ?Kidney Function ?Lab Results  ?Component Value Date/Time  ? CREATININE 1.10 (H) 10/24/2021 09:53 AM  ? CREATININE 1.49 (H) 04/25/2021 07:55 AM  ?  CREATININE 1.89 (H) 10/23/2018 04:26 PM  ? GFR 35.39 (L) 04/25/2021 07:55 AM  ? GFRNONAA 54 (L) 10/24/2021 09:53 AM  ? GFRAA 38 (L) 10/17/2018 06:39 AM  ? ?BMP Latest Ref Rng & Units 10/24/2021 04/25/2021 01/30/2021  ?Glucose 70 - 99 mg/dL 109(H) 107(H) 143(H)  ?BUN 8 - 23 mg/dL 20 32(H) 27(H)  ?Creatinine 0.44 - 1.00 mg/dL 1.10(H) 1.49(H) 1.48(H)  ?BUN/Creat Ratio 6 - 22 (calc) - - -  ?Sodium 135 - 145 mmol/L 139 139 137  ?Potassium 3.5 - 5.1 mmol/L 4.3 5.1 4.8  ?Chloride 98 - 111 mmol/L 106 104 101  ?CO2 22 - 32 mmol/L '26 24 26  '$ ?Calcium 8.9 - 10.3 mg/dL 9.4 10.1 10.1  ? ?Contacted patient on 02/12/2022 to discuss hypertension disease state ? ?Current antihypertensive regimen:  ?Propranolol ER 120 mg - 1 capsule daily ?Patient stated she is also taking Amlodipine 10 mg 1 tablet daily prescribed by Dr. Dwyane Dee - Endocrinology.  ?Patient verbally confirms she is taking the above medications as directed. Yes ? ?How often are you checking your Blood Pressure? weekly Patient checks her blood pressure midday. ? ?Patient did not have readings at the time of the call. I asked patient to take her blood pressure daily and keep a log. I advised patient I would call her on Friday 02/15/2022 for the log. Patient agreed and understood.  ? ?Wrist or arm cuff: Arm cuff ?Caffeine intake: Patient drinks 1 glass of tea daily. ?Salt intake: Patient watches how much salt she uses.  ?Over the counter medications including pseudoephedrine or NSAIDs? Patient has been using Mucinex daily. Patient will also use Extra Strength Tylenol due to sinus headaches.  ? ?Any readings above 180/120? No ? ?What recent interventions/DTPs have been made by any provider to improve Blood Pressure control since last CPP Visit:  No recent interventions  ? ?Any recent hospitalizations or ED visits since last visit with CPP? No ? ?What diet changes have been made to improve Blood Pressure Control?  ?Patient watches what she eats and watches how much salt she  uses.  ? ?What exercise is being done to improve your Blood Pressure Control?  ?Patient states she exercises, but did not go in to detail.  ? ?Adherence Review: ?Is the patient currently on ACE/ARB medication? No ?Does the patient have >5 day gap between last estimated fill dates? N/A ? ?Star Rating Drugs:  ?Medication:  Last Fill: Day Supply ?No star rating drugs noted ? ?Care Gaps: ?Annual wellness visit in last year? Yes 04/27/2021 ?Most Recent BP reading: 154/75 on 11/16/2021 ? ?Upcoming appointments: ?No appointments scheduled within the next 30 days. ? ?Charlene Brooke, CPP notified ? ?Marijean Niemann, RMA ?Clinical Pharmacy Assistant ?[phone] ? ?. ? ? ? ? ? ? ?

## 2022-02-14 ENCOUNTER — Other Ambulatory Visit: Payer: Self-pay

## 2022-02-14 ENCOUNTER — Encounter: Payer: Self-pay | Admitting: Endocrinology

## 2022-02-14 ENCOUNTER — Ambulatory Visit (INDEPENDENT_AMBULATORY_CARE_PROVIDER_SITE_OTHER): Payer: Medicare Other | Admitting: Endocrinology

## 2022-02-14 VITALS — BP 124/88 | HR 79 | Ht 65.5 in | Wt 186.2 lb

## 2022-02-14 DIAGNOSIS — E890 Postprocedural hypothyroidism: Secondary | ICD-10-CM | POA: Diagnosis not present

## 2022-02-14 NOTE — Progress Notes (Signed)
Patient ID: [name], female   DOB: [birth year], 71 y.o.   MRN: [id number]            Referring Provider: Dr. Thea Alken  Reason for Appointment:  Hypothyroidism, follow-up visit    History of Present Illness:   Hypothyroidism was first diagnosed in 2006  At the time of diagnosis patient had radioactive iron treatment for reportedly Graves' disease  With her Graves' disease she had symptoms of palpitations and increased blood pressure as well as some chest discomfort and was diagnosed during her hospitalization  Initially may have been given PTU but subsequently had I-131 treatment resulting in hypothyroidism  The patient has been treated with levothyroxine and liothyronine by her previous endocrinologist since she was diagnosed as hypothyroid Although she had been generally taking the same regimen for years she has had difficulty maintaining normal levels for the last 3 years or so She thinks this was subsequent to a respiratory viral infection  In early 2022 she was taking 75 mcg of levothyroxine along with 12.5 mcg of liothyronine However because of persistently higher TSH levels dose of levothyroxine was gradually increased all the way up to 125 mcg in March However at that time her TSH was high she was feeling good with normal energy level When her TSH was slightly low in 5/22 her dose of levothyroxine was reduced to 100 mcg; lab work about a month later showed continuously suppressed TSH She says that with increasing her levothyroxine dose she has had more feeling of fatigue and sometimes somnolence  RECENT history:  On her initial visit she was complaining of fatigue and some limitations but not palpitations  With her TSH being low and free T3 level high her dose was adjusted sequentially In 11/22 she was having more fatigue sweating and and somnolence Because of her T3 level being high she was told to stop liothyronine completely  Again since her last visit she has been  feeling quite well and has good energy level Also thinks she is having slightly better memory function She does tend to have periodic sweating as before  Weight has come back up  She has been taking levothyroxine before breakfast consistently Currently only taking 100 mcg levothyroxine  Thyroid levels are back to normal consistently now  Patient's weight history is as follows:  Wt Readings from Last 3 Encounters:  02/14/22 186 lb 3.2 oz (84.5 kg)  11/14/21 182 lb 3.2 oz (82.6 kg)  10/24/21 177 lb (80.3 kg)    Thyroid function results have been as follows:  Lab Results  Component Value Date   TSH 1.63 02/11/2022   TSH 0.62 11/09/2021   TSH 0.03 (L) 10/05/2021   TSH 0.02 (L) 08/29/2021   FREET4 1.17 02/11/2022   FREET4 1.05 11/09/2021   FREET4 0.89 10/05/2021   FREET4 1.07 08/29/2021   T3FREE 3.1 02/11/2022   T3FREE 3.8 11/09/2021   T3FREE 4.5 (H) 08/29/2021     Past Medical History:  Diagnosis Date   Allergic rhinitis    Allergy    Anemia    b12 def.pt. unaware   Asthma    controlled w/ singulair   Herpes simplex without mention of complication    controlled w/ meds (no current fever blisters)   History of hiatal hernia    History of kidney stones    HLD (hyperlipidemia)    borderline-  no meds   HTN (hypertension)    echo- 2006   Hyperthyroidism hx ptu 2006  takes synthroid; had Graves disease, was treated for that- now thyroid doesn't work   Leukocytosis, unspecified hx of 10 yrs ago   no problem since   Migraine    Mixed incontinence    urge and stress incontinence   Renal cyst right    pt is unsure of this, has had kidney stones   Ureteral calculi right    s/p laser  litho w/ stone extraction 01-28-11    Past Surgical History:  Procedure Laterality Date   BIOPSY  10/16/2018   Procedure: BIOPSY;  Surgeon: Milus Banister, MD;  Location: [facility] ENDOSCOPY;  Service: Endoscopy;;   [place]   fusion c4-6    CERVICAL FUSION  2005   fusion c6-7 and plate removal M0-1   COLONOSCOPY     CYSTOSCOPY W/ RETROGRADES  10/21/2011   Procedure: CYSTOSCOPY WITH RETROGRADE PYELOGRAM;  Surgeon: Ailene Rud, MD;  Location: [place];  Service: Urology;  Laterality: Right;  CYSTOSCOPY RIGHT RETROGRADE, PYELOGRAM  URETEROSCOPY WITH HOLMIUM LASER AND STONE EXTRACTION   CYSTOSCOPY/RETROGRADE/URETEROSCOPY/STONE EXTRACTION WITH BASKET  01-28-11   right    CYSTOSCOPY/URETEROSCOPY/HOLMIUM LASER/STENT PLACEMENT Right 11/16/2021   Procedure: CYSTOSCOPY/RETROGRADE/URETEROSCOPY/HOLMIUM LASER/STENT PLACEMENT;  Surgeon: Janith Lima, MD;  Location: [facility] ORS;  Service: Urology;  Laterality: Right;   ESOPHAGOGASTRODUODENOSCOPY (EGD) WITH PROPOFOL N/A 10/16/2018   Procedure: ESOPHAGOGASTRODUODENOSCOPY (EGD) WITH PROPOFOL;  Surgeon: Milus Banister, MD;  Location[facility] ENDOSCOPY;  Service: Endoscopy;  Laterality: N/A;   REDUCTION MAMMAPLASTY Bilateral 1995   TONSILLECTOMY  1960   VAGINAL HYSTERECTOMY  1987   fibroids/anemia    Family History  Problem Relation Age of Onset   Osteoporosis Mother    Diabetes Mother    Heart disease Father    Heart failure Father    Diabetes Other        Grandfather   Coronary artery disease Other        Grandmother   Breast cancer Neg Hx    Colon cancer Neg Hx    Esophageal cancer Neg Hx    Stomach cancer Neg Hx    Rectal cancer Neg Hx    Thyroid disease Neg Hx     Social History:  reports that she has never smoked. She has never used smokeless tobacco. She reports that she does not drink alcohol and does not use drugs.  Allergies:  Allergies  Allergen Reactions   Contrast Media [Iodinated Contrast Media] Anaphylaxis   Shellfish-Derived Products Anaphylaxis   Septra [Sulfamethoxazole-Trimethoprim] Nausea Only    Allergies as of 02/14/2022       Reactions   Contrast Media [iodinated Contrast Media] Anaphylaxis   Shellfish-derived Products Anaphylaxis    Septra [sulfamethoxazole-trimethoprim] Nausea Only        Medication List        Accurate as of February 14, 2022 10:19 AM. If you have any questions, ask your nurse or doctor.          acetaminophen 500 MG tablet Commonly known as: TYLENOL Take 1,000 mg by mouth every 6 (six) hours as needed for moderate pain.   amLODipine 5 MG tablet Commonly known as: NORVASC Take 1 tablet (5 mg total) by mouth daily.   amLODipine 10 MG tablet Commonly known as: NORVASC Take 1 tablet (10 mg total) by mouth daily.   B-12 PO Take 1 capsule by mouth daily.   CALCIUM 500/D PO Take 1 tablet by mouth daily.   docusate  sodium 100 MG capsule Commonly known as: Colace Take 1 capsule (100 mg total) by mouth daily as needed for up to 30 doses.   fenofibrate 54 MG tablet TAKE 1 TABLET BY MOUTH  DAILY   levothyroxine 100 MCG tablet Commonly known as: SYNTHROID Take 1 tablet (100 mcg total) by mouth daily.   montelukast 10 MG tablet Commonly known as: SINGULAIR Take 1 tablet (10 mg total) by mouth daily.   multivitamin tablet Take 1 tablet by mouth daily.   Myrbetriq 50 MG Tb24 tablet Generic drug: mirabegron ER TAKE 1 TABLET BY MOUTH  DAILY   oxyCODONE-acetaminophen 5-325 MG tablet Commonly known as: Percocet Take 1 tablet by mouth every 4 (four) hours as needed for up to 12 doses for severe pain.   propranolol ER 120 MG 24 hr capsule Commonly known as: INDERAL LA Take 1 capsule (120 mg total) by mouth daily.   SUMAtriptan 100 MG tablet Commonly known as: IMITREX TAKE 1 TABLET FOR HEADACHE MAY REPEAT ONCE IN 2 HOURS IF NEEDED. MAXIMUM OF 2    TABLETS IN ONE DAY. What changed:  how much to take how to take this when to take this reasons to take this additional instructions           Review of Systems  HYPERTENSION: Now taking amlodipine 10 mg and also propranolol 120 She says her blood pressure at home is in the 130s/80s      BP Readings from Last 3 Encounters:   02/14/22 124/88  11/16/21 (!) 154/75  11/14/21 140/88          History of hydronephrosis and right atrophic kidney  Lab Results  Component Value Date   CREATININE 1.10 (H) 10/24/2021   CREATININE 1.49 (H) 04/25/2021   CREATININE 1.48 (H) 01/30/2021      Examination:    BP 124/88    Pulse 79    Ht 5' 5.5" (1.664 m)    Wt 186 lb 3.2 oz (84.5 kg)    SpO2 98%    BMI 30.51 kg/m    No thyroid enlargement felt  Biceps reflexes are normal Skin appears normal    Assessment:  HYPOTHYROIDISM, post radioactive iodine treatment for Graves' disease several years ago  Current regimen is 100 mcg of levothyroxine alone  She is doing subjectively well, previously had symptoms of mild hyperthyroidism when taking liothyronine also  HYPERTENSION: Her blood pressure is high normal and she will continue to follow-up with PCP   PLAN:    Continue 100 mcg levothyroxine as monotherapy Follow-up in 6 months   Debbora Ang 02/14/2022, 10:19 AM    Note: This office note was prepared with Dragon voice recognition system technology. Any transcriptional errors that result from this process are unintentional.

## 2022-02-15 NOTE — Progress Notes (Signed)
Called patient for blood pressure log as previously discussed. ? ?Date  Blood Pressure ?03/10  140/82 ?03/09  122/88 ?03/08  133/84 ? ?[name], CPP notified ? ?[name], RMA ?Clinical Pharmacy Assistant ?[phone] ? ? ?

## 2022-02-19 ENCOUNTER — Other Ambulatory Visit: Payer: Self-pay | Admitting: Family Medicine

## 2022-02-19 DIAGNOSIS — Z1231 Encounter for screening mammogram for malignant neoplasm of breast: Secondary | ICD-10-CM

## 2022-03-08 ENCOUNTER — Other Ambulatory Visit: Payer: Self-pay | Admitting: Family Medicine

## 2022-03-22 ENCOUNTER — Other Ambulatory Visit: Payer: Self-pay | Admitting: Family Medicine

## 2022-04-05 ENCOUNTER — Ambulatory Visit
Admission: RE | Admit: 2022-04-05 | Discharge: 2022-04-05 | Disposition: A | Discharge status: Home / Self Care | Payer: Medicare Other | Source: Ambulatory Visit | Attending: Family Medicine | Admitting: Family Medicine

## 2022-04-05 DIAGNOSIS — Z1231 Encounter for screening mammogram for malignant neoplasm of breast: Secondary | ICD-10-CM | POA: Diagnosis not present

## 2022-04-24 ENCOUNTER — Other Ambulatory Visit: Payer: Self-pay

## 2022-04-24 DIAGNOSIS — I10 Essential (primary) hypertension: Secondary | ICD-10-CM

## 2022-04-24 MED ORDER — AMLODIPINE BESYLATE 10 MG PO TABS: 10.0000 mg | ORAL_TABLET | Freq: Every day | ORAL | 0 refills | Status: DC

## 2022-04-29 ENCOUNTER — Ambulatory Visit (INDEPENDENT_AMBULATORY_CARE_PROVIDER_SITE_OTHER): Payer: Medicare Other

## 2022-04-29 VITALS — Ht 65.5 in | Wt 186.0 lb

## 2022-04-29 DIAGNOSIS — Z0000 Encounter for general adult medical examination without abnormal findings: Secondary | ICD-10-CM

## 2022-04-29 NOTE — Progress Notes (Signed)
Subjective:   [name] is a 71 y.o. female who presents for Medicare Annual (Subsequent) preventive examination. Virtual Visit via Telephone Note  I connected with  [name] on 04/29/22 at  8:15 AM EDT by telephone and verified that I am speaking with the correct person using two identifiers.  Location: Patient: HOME Provider: [facility] Persons participating in the virtual visit: patient/Nurse Health Advisor   I discussed the limitations, risks, security and privacy concerns of performing an evaluation and management service by telephone and the availability of in person appointments. The patient expressed understanding and agreed to proceed.  Interactive audio and video telecommunications were attempted between this nurse and patient, however failed, due to patient having technical difficulties OR patient did not have access to video capability.  We continued and completed visit with audio only.  Some vital signs may be absent or patient reported.   Chriss Driver, LPN  Review of Systems     Cardiac Risk Factors include: advanced age (>68mn, >>70women);hypertension;obesity (BMI >30kg/m2);sedentary lifestyle     Objective:    Today's Vitals   04/29/22 0818  Weight: 186 lb (84.4 kg)  Height: 5' 5.5" (1.664 m)   Body mass index is 30.48 kg/m.     04/29/2022    8:25 AM 11/16/2021   10:20 AM 10/24/2021   10:11 AM 04/27/2021    9:51 AM 04/26/2020    9:46 AM 04/14/2019   10:41 AM 10/14/2018    6:48 PM  Advanced Directives  Does Patient Have a Medical Advance Directive? Yes Yes Yes Yes Yes Yes Yes  Type of AParamedicof ARutledgeLiving will HAntaresLiving will HBraggsLiving will HBurnsvilleLiving will HHendersonvilleLiving will HRowlesburgLiving will HWathaLiving will  Does patient want to make changes to medical advance  directive?  No - Patient declined No - Patient declined    No - Patient declined  Copy of HLetcherin Chart? No - copy requested No - copy requested Yes - validated most recent copy scanned in chart (See row information) No - copy requested Yes - validated most recent copy scanned in chart (See row information) Yes - validated most recent copy scanned in chart (See row information) Yes - validated most recent copy scanned in chart (See row information)    Current Medications (verified) Outpatient Encounter Medications as of 04/29/2022  Medication Sig   levothyroxine (SYNTHROID) 100 MCG tablet Take 1 tablet (100 mcg total) by mouth daily before breakfast.   acetaminophen (TYLENOL) 500 MG tablet Take 1,000 mg by mouth every 6 (six) hours as needed for moderate pain.   amLODipine (NORVASC) 10 MG tablet Take 1 tablet (10 mg total) by mouth daily.   Calcium Carb-Cholecalciferol (CALCIUM 500/D PO) Take 1 tablet by mouth daily.   Cyanocobalamin (B-12 PO) Take 1 capsule by mouth daily.   fenofibrate 54 MG tablet TAKE 1 TABLET BY MOUTH  DAILY   montelukast (SINGULAIR) 10 MG tablet TAKE 1 TABLET BY MOUTH  DAILY   Multiple Vitamin (MULTIVITAMIN) tablet Take 1 tablet by mouth daily.   MYRBETRIQ 50 MG TB24 tablet TAKE 1 TABLET BY MOUTH  DAILY   Potassium Citrate 15 MEQ (1620 MG) TBCR Take 1 tablet by mouth 2 (two) times daily.   propranolol ER (INDERAL LA) 120 MG 24 hr capsule TAKE 1 CAPSULE BY MOUTH  DAILY   SUMAtriptan (IMITREX)  100 MG tablet TAKE 1 TABLET FOR HEADACHE MAY REPEAT ONCE IN 2 HOURS IF NEEDED. MAXIMUM OF 2    TABLETS IN ONE DAY. (Patient taking differently: Take 100 mg by mouth as needed for migraine or headache. TAKE 100 mg TABLET FOR HEADACHE MAY REPEAT ONCE IN 2 HOURS IF NEEDED. MAXIMUM OF 200 mg    TABLETS IN ONE DAY.)   [DISCONTINUED] amLODipine (NORVASC) 5 MG tablet Take 1 tablet (5 mg total) by mouth daily.   [DISCONTINUED] docusate sodium (COLACE) 100 MG capsule Take  1 capsule (100 mg total) by mouth daily as needed for up to 30 doses.   [DISCONTINUED] oxyCODONE-acetaminophen (PERCOCET) 5-325 MG tablet Take 1 tablet by mouth every 4 (four) hours as needed for up to 12 doses for severe pain.   No facility-administered encounter medications on file as of 04/29/2022.    Allergies (verified) Contrast media [iodinated contrast media], Shellfish-derived products, and Septra [sulfamethoxazole-trimethoprim]   History: Past Medical History:  Diagnosis Date   Allergic rhinitis    Allergy    Anemia    b12 def.pt. unaware   Asthma    controlled w/ singulair   Herpes simplex without mention of complication    controlled w/ meds (no current fever blisters)   History of hiatal hernia    History of kidney stones    HLD (hyperlipidemia)    borderline-  no meds   HTN (hypertension)    echo- 2006   Hyperthyroidism hx ptu 2006   takes synthroid; had Graves disease, was treated for that- now thyroid doesn't work   Leukocytosis, unspecified hx of 10 yrs ago   no problem since   Migraine    Mixed incontinence    urge and stress incontinence   Renal cyst right    pt is unsure of this, has had kidney stones   Ureteral calculi right    s/p laser  litho w/ stone extraction 01-28-11   Past Surgical History:  Procedure Laterality Date   BIOPSY  10/16/2018   Procedure: BIOPSY;  Surgeon: Milus Banister, MD;  Location: [place];  Service: Endoscopy;;   Media   fusion c4-6   CERVICAL FUSION  2005   fusion c6-7 and plate removal Y3-0   COLONOSCOPY     CYSTOSCOPY W/ RETROGRADES  10/21/2011   Procedure: CYSTOSCOPY WITH RETROGRADE PYELOGRAM;  Surgeon: Ailene Rud, MD;  Location: [place];  Service: Urology;  Laterality: Right;  CYSTOSCOPY RIGHT RETROGRADE, PYELOGRAM  URETEROSCOPY WITH HOLMIUM LASER AND STONE EXTRACTION   CYSTOSCOPY/RETROGRADE/URETEROSCOPY/STONE EXTRACTION WITH BASKET   01-28-11   right    CYSTOSCOPY/URETEROSCOPY/HOLMIUM LASER/STENT PLACEMENT Right 11/16/2021   Procedure: CYSTOSCOPY/RETROGRADE/URETEROSCOPY/HOLMIUM LASER/STENT PLACEMENT;  Surgeon: Janith Lima, MD;  Location: [facility];  Service: Urology;  Laterality: Right;   ESOPHAGOGASTRODUODENOSCOPY (EGD) WITH PROPOFOL N/A 10/16/2018   Procedure: ESOPHAGOGASTRODUODENOSCOPY (EGD) WITH PROPOFOL;  Surgeon: Milus Banister, MD;  [facility];  Service: Endoscopy;  Laterality: N/A;   REDUCTION MAMMAPLASTY Bilateral 1995   TONSILLECTOMY  1960   VAGINAL HYSTERECTOMY  1987   fibroids/anemia   Family History  Problem Relation Age of Onset   Osteoporosis Mother    Diabetes Mother    Heart disease Father    Heart failure Father    Diabetes Other        Grandfather   Coronary artery disease Other        Grandmother   Breast cancer Neg  Hx    Colon cancer Neg Hx    Esophageal cancer Neg Hx    Stomach cancer Neg Hx    Rectal cancer Neg Hx    Thyroid disease Neg Hx    Social History   Socioeconomic History   Marital status: Married    Spouse name: Nathaneil Canary   Number of children: 1   Years of education: Not on file   Highest education level: Not on file  Occupational History   Occupation: Publishing rights manager: [employer]  Tobacco Use   Smoking status: Never   Smokeless tobacco: Never  Vaping Use   Vaping Use: Never used  Substance and Sexual Activity   Alcohol use: No    Alcohol/week: 0.0 standard drinks   Drug use: No   Sexual activity: Yes  Other Topics Concern   Not on file  Social History Narrative   Married  74 yrs 2023.   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   Social Determinants of Health   Financial Resource Strain: Low Risk    Difficulty of Paying Living Expenses: Not hard at all  Food Insecurity: No Food Insecurity   Worried About Charity fundraiser in the Last Year: Never true   Arboriculturist in the Last Year: Never true  Transportation  Needs: No Transportation Needs   Lack of Transportation (Medical): No   Lack of Transportation (Non-Medical): No  Physical Activity: Sufficiently Active   Days of Exercise per Week: 5 days   Minutes of Exercise per Session: 30 min  Stress: No Stress Concern Present   Feeling of Stress : Not at all  Social Connections: Socially Integrated   Frequency of Communication with Friends and Family: More than three times a week   Frequency of Social Gatherings with Friends and Family: More than three times a week   Attends Religious Services: More than 4 times per year   Active Member of [employer] or Organizations: Yes   Attends Music therapist: More than 4 times per year   Marital Status: Married    Tobacco Counseling Counseling given: Not Answered   Clinical Intake:  Pre-visit preparation completed: Yes  Pain : No/denies pain     BMI - recorded: 30.48 Nutritional Status: BMI > 30  Obese Nutritional Risks: None Diabetes: No  How often do you need to have someone help you when you read instructions, pamphlets, or other written materials from your doctor or pharmacy?: 1 - Never  Diabetic?NO  Interpreter Needed?: No  Information entered by :: mj Kaydense Rizo, lpn   Activities of Daily Living    04/29/2022    8:25 AM 10/24/2021   10:12 AM  In your present state of health, do you have any difficulty performing the following activities:  Hearing? 0   Vision? 0   Difficulty concentrating or making decisions? 0   Walking or climbing stairs? 0   Dressing or bathing? 0   Doing errands, shopping? 0 0  Preparing Food and eating ? N   Using the Toilet? N   In the past six months, have you accidently leaked urine? Y   Comment Currently on Mybretiq   Do you have problems with loss of bowel control? N   Managing your Medications? N   Managing your Finances? N   Housekeeping or managing your Housekeeping? N     Patient Care Team: [place], Wynelle Fanny, MD as PCP - Trina Ao,  Sharyn Lull, [facility] as Pharmacist (Pharmacist)  Indicate any recent Medical Services you may have received from other than [facility] providers in the past year (date may be approximate).     Assessment:   This is a routine wellness examination for [name].  Hearing/Vision screen Hearing Screening - Comments:: No hearing issues.  Vision Screening - Comments:: Readers. Dr. Nicki Reaper 05/01/22.   Dietary issues and exercise activities discussed: Current Exercise Habits: Home exercise routine, Type of exercise: walking, Time (Minutes): 30, Frequency (Times/Week): 5, Weekly Exercise (Minutes/Week): 150, Intensity: Mild, Exercise limited by: cardiac condition(s)   Goals Addressed             This Visit's Progress    Patient Stated   On track    04/26/2020, I will continue to walk 3 days a week for 45 minutes.      Patient Stated   On track    04/27/2021, I will continue to walk daily and work in my garden.       Depression Screen    04/29/2022    8:22 AM 04/27/2021    9:52 AM 04/26/2020    9:47 AM 04/14/2019   10:51 AM 04/08/2018    9:59 AM 04/04/2017    9:15 AM 04/03/2016    2:24 PM  PHQ 2/9 Scores  PHQ - 2 Score 0 0 0 0 0 0 0  PHQ- 9 Score  0 0 0 0      Fall Risk    04/29/2022    8:25 AM 04/27/2021    9:51 AM 04/26/2020    9:46 AM 04/14/2019   10:51 AM 04/08/2018    9:59 AM  Fall Risk   Falls in the past year? 0 0 0 0 No  Number falls in past yr: 0 0 0    Injury with Fall? 0 0 0    Risk for fall due to : No Fall Risks Medication side effect Medication side effect    Follow up Falls prevention discussed Falls evaluation completed;Falls prevention discussed Falls evaluation completed;Falls prevention discussed      FALL RISK PREVENTION PERTAINING TO THE HOME:  Any stairs in or around the home? No  If so, are there any without handrails? No  Home free of loose throw rugs in walkways, pet beds, electrical cords, etc? Yes  Adequate lighting in your home to reduce risk of falls? Yes   ASSISTIVE  DEVICES UTILIZED TO PREVENT FALLS:  Life alert? No  Use of a cane, walker or w/c? No  Grab bars in the bathroom? Yes  Shower chair or bench in shower? Yes  Elevated toilet seat or a handicapped toilet? Yes   TIMED UP AND GO:  Was the test performed? No . PHONE VISIT.  Cognitive Function:    04/27/2021   10:00 AM 04/26/2020    9:50 AM 04/14/2019   10:51 AM 04/08/2018   10:00 AM 04/04/2017    9:32 AM  MMSE - Mini Mental State Exam  Orientation to time '5 5 5 5 5  '$ Orientation to Place '5 5 5 5 5  '$ Registration '3 3 3 3 3  '$ Attention/ Calculation 5 5 0 0 0  Recall '3 3 3 3 3  '$ Language- name 2 objects   0 0 0  Language- repeat '1 1 1 1 1  '$ Language- follow 3 step command   0 3 3  Language- read & follow direction   0 0 0  Write a sentence   0 0 0  Copy design   0 0 0  Total score   '17 20 20        '$ 04/29/2022    8:26 AM  6CIT Screen  What Year? 0 points  What month? 0 points  What time? 0 points  Count back from 20 0 points  Months in reverse 0 points  Repeat phrase 0 points  Total Score 0 points    Immunizations Immunization History  Administered Date(s) Administered   Influenza Split 08/10/2011   Influenza Whole 10/10/2007, 08/17/2008, 08/31/2009, 08/09/2010   Influenza, High Dose Seasonal PF 07/18/2017, 07/09/2018, 08/06/2019, 08/16/2020, 08/16/2020, 08/17/2020, 07/13/2021   Influenza,inj,Quad PF,6+ Mos 08/04/2015, 09/02/2016   Influenza-Unspecified 08/09/2014   PFIZER Comirnaty(Gray Top)Covid-19 Tri-Sucrose Vaccine 03/09/2021   PFIZER(Purple Top)SARS-COV-2 Vaccination 01/17/2020, 02/09/2020, 09/07/2020, 09/14/2021   PNEUMOCOCCAL CONJUGATE-20 05/24/2021   Pfizer Covid-19 Vaccine Bivalent Booster 47yr & up 09/14/2021   Pneumococcal Conjugate-13 11/28/2015   Pneumococcal Polysaccharide-23 08/25/2003, 10/10/2007   Pneumococcal-Unspecified 11/27/2016   Td 10/21/2006   Tdap 03/20/2015   Zoster Recombinat (Shingrix) 09/04/2018, 12/09/2018   Zoster, Live 03/25/2012    TDAP  status: Up to date  Flu Vaccine status: Up to date  Pneumococcal vaccine status: Up to date  Covid-19 vaccine status: Completed vaccines  Qualifies for Shingles Vaccine? Yes   Zostavax completed Yes   Shingrix Completed?: Yes  Screening Tests Health Maintenance  Topic Date Due   INFLUENZA VACCINE  07/09/2022   MAMMOGRAM  04/06/2023   TETANUS/TDAP  03/19/2025   COLONOSCOPY (Pts 45-464yrInsurance coverage will need to be confirmed)  05/19/2028   Pneumonia Vaccine 6579Years old  Completed   DEXA SCAN  Completed   COVID-19 Vaccine  Completed   Hepatitis C Screening  Completed   Zoster Vaccines- Shingrix  Completed   HPV VACCINES  Aged Out    Health Maintenance  There are no preventive care reminders to display for this patient.  Colorectal cancer screening: Type of screening: Colonoscopy. Completed 05/19/2018. Repeat every 10 years  Mammogram status: Completed 04/05/2022. Repeat every year  Bone Density status: Completed 05/25/2018. Results reflect: Bone density results: NORMAL. Repeat every 2 years.  Lung Cancer Screening: (Low Dose CT Chest recommended if Age 72-80ears, 30 pack-year currently smoking OR have quit w/in 15years.) does not qualify.   Additional Screening:  Hepatitis C Screening: does qualify; Completed 03/17/2007  Vision Screening: Recommended annual ophthalmology exams for early detection of glaucoma and other disorders of the eye. Is the patient up to date with their annual eye exam?  Yes  Who is the provider or what is the name of the office in which the patient attends annual eye exams? Dr. ScNicki Reaperf pt is not established with a provider, would they like to be referred to a provider to establish care? No .   Dental Screening: Recommended annual dental exams for proper oral hygiene  Community Resource Referral / Chronic Care Management: CRR required this visit?  No   CCM required this visit?  No      Plan:     I have personally reviewed and  noted the following in the patient's chart:   Medical and social history Use of alcohol, tobacco or illicit drugs  Current medications and supplements including opioid prescriptions.  Functional ability and status Nutritional status Physical activity Advanced directives List of other physicians Hospitalizations, surgeries, and ER visits in previous 12 months Vitals Screenings to include cognitive, depression, and falls Referrals and appointments  In addition, I have reviewed and discussed  with patient certain preventive protocols, quality metrics, and best practice recommendations. A written personalized care plan for preventive services as well as general preventive health recommendations were provided to patient.     Chriss Driver, LPN   1/94/1740   Nurse Notes: Discussed DEXA. Pt will discuss scheduling with Endocrinologist.

## 2022-04-29 NOTE — Patient Instructions (Signed)
[name] , Thank you for taking time to come for your Medicare Wellness Visit. I appreciate your ongoing commitment to your health goals. Please review the following plan we discussed and let me know if I can assist you in the future.   Screening recommendations/referrals: Colonoscopy: Done 05/19/2018 Repeat in 10 years  Mammogram: Done 04/05/2022 Repeat annually  Bone Density: Done 05/25/2018 Repeat every 2 years  Recommended yearly ophthalmology/optometry visit for glaucoma screening and checkup Recommended yearly dental visit for hygiene and checkup  Vaccinations: Influenza vaccine: Done 07/13/2021 Repeat annually  Pneumococcal vaccine: Done 05/24/2021 and 11/10/2015 Tdap vaccine: Done 03/20/2015 Repeat in 10 years  Shingles vaccine: Done 12/09/2018, 09/04/2018 and 03/25/2012   Covid-19:Done 03/09/2021, 09/14/2021, 09/07/2020, 02/09/2020 and 01/17/2020  Advanced directives: Please bring a copy of your health care power of attorney and living will to the office to be added to your chart at your convenience.   Conditions/risks identified: KEEP UP THE GOOD WORK!!  Next appointment: Follow up in one year for your annual wellness visit 2024.   Preventive Care 72 Years and Older, Female Preventive care refers to lifestyle choices and visits with your health care provider that can promote health and wellness. What does preventive care include? A yearly physical exam. This is also called an annual well check. Dental exams once or twice a year. Routine eye exams. Ask your health care provider how often you should have your eyes checked. Personal lifestyle choices, including: Daily care of your teeth and gums. Regular physical activity. Eating a healthy diet. Avoiding tobacco and drug use. Limiting alcohol use. Practicing safe sex. Taking low-dose aspirin every day. Taking vitamin and mineral supplements as recommended by your health care provider. What happens during an annual well check? The  services and screenings done by your health care provider during your annual well check will depend on your age, overall health, lifestyle risk factors, and family history of disease. Counseling  Your health care provider may ask you questions about your: Alcohol use. Tobacco use. Drug use. Emotional well-being. Home and relationship well-being. Sexual activity. Eating habits. History of falls. Memory and ability to understand (cognition). Work and work Statistician. Reproductive health. Screening  You may have the following tests or measurements: Height, weight, and BMI. Blood pressure. Lipid and cholesterol levels. These may be checked every 5 years, or more frequently if you are over 72 years old. Skin check. Lung cancer screening. You may have this screening every year starting at age 72 if you have a 30-pack-year history of smoking and currently smoke or have quit within the past 15 years. Fecal occult blood test (FOBT) of the stool. You may have this test every year starting at age 72. Flexible sigmoidoscopy or colonoscopy. You may have a sigmoidoscopy every 5 years or a colonoscopy every 10 years starting at age 72. Hepatitis C blood test. Hepatitis B blood test. Sexually transmitted disease (STD) testing. Diabetes screening. This is done by checking your blood sugar (glucose) after you have not eaten for a while (fasting). You may have this done every 1-3 years. Bone density scan. This is done to screen for osteoporosis. You may have this done starting at age 72. Mammogram. This may be done every 1-2 years. Talk to your health care provider about how often you should have regular mammograms. Talk with your health care provider about your test results, treatment options, and if necessary, the need for more tests. Vaccines  Your health care provider may recommend certain vaccines, such  as: Influenza vaccine. This is recommended every year. Tetanus, diphtheria, and acellular  pertussis (Tdap, Td) vaccine. You may need a Td booster every 10 years. Zoster vaccine. You may need this after age 8. Pneumococcal 13-valent conjugate (PCV13) vaccine. One dose is recommended after age 72. Pneumococcal polysaccharide (PPSV23) vaccine. One dose is recommended after age 72. Talk to your health care provider about which screenings and vaccines you need and how often you need them. This information is not intended to replace advice given to you by your health care provider. Make sure you discuss any questions you have with your health care provider. Document Released: 12/22/2015 Document Revised: 08/14/2016 Document Reviewed: 09/26/2015 Elsevier Interactive Patient Education  2017 [place] Prevention in the Home Falls can cause injuries. They can happen to people of all ages. There are many things you can do to make your home safe and to help prevent falls. What can I do on the outside of my home? Regularly fix the edges of walkways and driveways and fix any cracks. Remove anything that might make you trip as you walk through a door, such as a raised step or threshold. Trim any bushes or trees on the path to your home. Use bright outdoor lighting. Clear any walking paths of anything that might make someone trip, such as rocks or tools. Regularly check to see if handrails are loose or broken. Make sure that both sides of any steps have handrails. Any raised decks and porches should have guardrails on the edges. Have any leaves, snow, or ice cleared regularly. Use sand or salt on walking paths during winter. Clean up any spills in your garage right away. This includes oil or grease spills. What can I do in the bathroom? Use night lights. Install grab bars by the toilet and in the tub and shower. Do not use towel bars as grab bars. Use non-skid mats or decals in the tub or shower. If you need to sit down in the shower, use a plastic, non-slip stool. Keep the floor  dry. Clean up any water that spills on the floor as soon as it happens. Remove soap buildup in the tub or shower regularly. Attach bath mats securely with double-sided non-slip rug tape. Do not have throw rugs and other things on the floor that can make you trip. What can I do in the bedroom? Use night lights. Make sure that you have a light by your bed that is easy to reach. Do not use any sheets or blankets that are too big for your bed. They should not hang down onto the floor. Have a firm chair that has side arms. You can use this for support while you get dressed. Do not have throw rugs and other things on the floor that can make you trip. What can I do in the kitchen? Clean up any spills right away. Avoid walking on wet floors. Keep items that you use a lot in easy-to-reach places. If you need to reach something above you, use a strong step stool that has a grab bar. Keep electrical cords out of the way. Do not use floor polish or wax that makes floors slippery. If you must use wax, use non-skid floor wax. Do not have throw rugs and other things on the floor that can make you trip. What can I do with my stairs? Do not leave any items on the stairs. Make sure that there are handrails on both sides of the stairs and use  them. Fix handrails that are broken or loose. Make sure that handrails are as long as the stairways. Check any carpeting to make sure that it is firmly attached to the stairs. Fix any carpet that is loose or worn. Avoid having throw rugs at the top or bottom of the stairs. If you do have throw rugs, attach them to the floor with carpet tape. Make sure that you have a light switch at the top of the stairs and the bottom of the stairs. If you do not have them, ask someone to add them for you. What else can I do to help prevent falls? Wear shoes that: Do not have high heels. Have rubber bottoms. Are comfortable and fit you well. Are closed at the toe. Do not wear  sandals. If you use a stepladder: Make sure that it is fully opened. Do not climb a closed stepladder. Make sure that both sides of the stepladder are locked into place. Ask someone to hold it for you, if possible. Clearly mark and make sure that you can see: Any grab bars or handrails. First and last steps. Where the edge of each step is. Use tools that help you move around (mobility aids) if they are needed. These include: Canes. Walkers. Scooters. Crutches. Turn on the lights when you go into a dark area. Replace any light bulbs as soon as they burn out. Set up your furniture so you have a clear path. Avoid moving your furniture around. If any of your floors are uneven, fix them. If there are any pets around you, be aware of where they are. Review your medicines with your doctor. Some medicines can make you feel dizzy. This can increase your chance of falling. Ask your doctor what other things that you can do to help prevent falls. This information is not intended to replace advice given to you by your health care provider. Make sure you discuss any questions you have with your health care provider. Document Released: 09/21/2009 Document Revised: 05/02/2016 Document Reviewed: 12/30/2014 Elsevier Interactive Patient Education  2017 [employer].

## 2022-04-30 ENCOUNTER — Telehealth: Payer: Self-pay | Admitting: Family Medicine

## 2022-04-30 DIAGNOSIS — R7303 Prediabetes: Secondary | ICD-10-CM

## 2022-04-30 DIAGNOSIS — E039 Hypothyroidism, unspecified: Secondary | ICD-10-CM

## 2022-04-30 DIAGNOSIS — D75839 Thrombocytosis, unspecified: Secondary | ICD-10-CM

## 2022-04-30 DIAGNOSIS — E781 Pure hyperglyceridemia: Secondary | ICD-10-CM

## 2022-04-30 DIAGNOSIS — I10 Essential (primary) hypertension: Secondary | ICD-10-CM

## 2022-04-30 NOTE — Telephone Encounter (Signed)
-----   Message from Velna Hatchet, RT sent at 04/15/2022  4:31 PM EDT ----- Regarding: Lab Thu 05/02/22 Patient is scheduled for cpx, please order future labs.  Thanks, Anda Kraft

## 2022-05-01 DIAGNOSIS — H2513 Age-related nuclear cataract, bilateral: Secondary | ICD-10-CM | POA: Diagnosis not present

## 2022-05-02 ENCOUNTER — Other Ambulatory Visit (INDEPENDENT_AMBULATORY_CARE_PROVIDER_SITE_OTHER): Payer: Medicare Other

## 2022-05-02 DIAGNOSIS — D75839 Thrombocytosis, unspecified: Secondary | ICD-10-CM

## 2022-05-02 DIAGNOSIS — E781 Pure hyperglyceridemia: Secondary | ICD-10-CM | POA: Diagnosis not present

## 2022-05-02 DIAGNOSIS — E039 Hypothyroidism, unspecified: Secondary | ICD-10-CM

## 2022-05-02 DIAGNOSIS — R7303 Prediabetes: Secondary | ICD-10-CM

## 2022-05-02 DIAGNOSIS — I10 Essential (primary) hypertension: Secondary | ICD-10-CM

## 2022-05-02 LAB — COMPREHENSIVE METABOLIC PANEL
ALT: 11 U/L (ref 0–35)
AST: 15 U/L (ref 0–37)
Albumin: 4.5 g/dL (ref 3.5–5.2)
Alkaline Phosphatase: 70 U/L (ref 39–117)
BUN: 36 mg/dL — ABNORMAL HIGH (ref 6–23)
CO2: 28 mEq/L (ref 19–32)
Calcium: 10 mg/dL (ref 8.4–10.5)
Chloride: 100 mEq/L (ref 96–112)
Creatinine, Ser: 1.54 mg/dL — ABNORMAL HIGH (ref 0.40–1.20)
GFR: 33.78 mL/min — ABNORMAL LOW (ref >60.00)
Glucose, Bld: 119 mg/dL — ABNORMAL HIGH (ref 70–99)
Potassium: 5.2 mEq/L — ABNORMAL HIGH (ref 3.5–5.1)
Sodium: 138 mEq/L (ref 135–145)
Total Bilirubin: 0.6 mg/dL (ref 0.2–1.2)
Total Protein: 7 g/dL (ref 6.0–8.3)

## 2022-05-02 LAB — CBC WITH DIFFERENTIAL/PLATELET
Basophils Absolute: 0.1 10*3/uL (ref 0.0–0.1)
Basophils Relative: 1.3 % (ref 0.0–3.0)
Eosinophils Absolute: 0.3 10*3/uL (ref 0.0–0.7)
Eosinophils Relative: 4.1 % (ref 0.0–5.0)
HCT: 40.3 % (ref 36.0–46.0)
Hemoglobin: 13.3 g/dL (ref 12.0–15.0)
Lymphocytes Relative: 36.6 % (ref 12.0–46.0)
Lymphs Abs: 2.3 10*3/uL (ref 0.7–4.0)
MCHC: 32.9 g/dL (ref 30.0–36.0)
MCV: 86.8 fl (ref 78.0–100.0)
Monocytes Absolute: 0.4 10*3/uL (ref 0.1–1.0)
Monocytes Relative: 5.8 % (ref 3.0–12.0)
Neutro Abs: 3.2 10*3/uL (ref 1.4–7.7)
Neutrophils Relative %: 52.2 % (ref 43.0–77.0)
Platelets: 468 10*3/uL — ABNORMAL HIGH (ref 150.0–400.0)
RBC: 4.64 Mil/uL (ref 3.87–5.11)
RDW: 13.7 % (ref 11.5–15.5)
WBC: 6.2 10*3/uL (ref 4.0–10.5)

## 2022-05-02 LAB — LIPID PANEL
Cholesterol: 206 mg/dL — ABNORMAL HIGH (ref 0–200)
HDL: 70.1 mg/dL (ref >39.00)
NonHDL: 135.9
Total CHOL/HDL Ratio: 3
Triglycerides: 270 mg/dL — ABNORMAL HIGH (ref 0.0–149.0)
VLDL: 54 mg/dL — ABNORMAL HIGH (ref 0.0–40.0)

## 2022-05-02 LAB — TSH: TSH: 3.02 u[IU]/mL (ref 0.35–5.50)

## 2022-05-02 LAB — HEMOGLOBIN A1C: Hgb A1c MFr Bld: 5.8 % (ref 4.6–6.5)

## 2022-05-02 LAB — LDL CHOLESTEROL, DIRECT: Direct LDL: 104 mg/dL

## 2022-05-03 ENCOUNTER — Encounter: Payer: Self-pay | Admitting: Family Medicine

## 2022-05-04 ENCOUNTER — Other Ambulatory Visit: Payer: Self-pay | Admitting: Family Medicine

## 2022-05-08 ENCOUNTER — Encounter: Payer: Self-pay | Admitting: Family Medicine

## 2022-05-08 ENCOUNTER — Ambulatory Visit (INDEPENDENT_AMBULATORY_CARE_PROVIDER_SITE_OTHER): Payer: Medicare Other | Admitting: Family Medicine

## 2022-05-08 VITALS — BP 125/88 | HR 71 | Temp 97.7°F | Ht 65.5 in | Wt 185.5 lb

## 2022-05-08 DIAGNOSIS — Z0000 Encounter for general adult medical examination without abnormal findings: Secondary | ICD-10-CM

## 2022-05-08 DIAGNOSIS — R7303 Prediabetes: Secondary | ICD-10-CM | POA: Diagnosis not present

## 2022-05-08 DIAGNOSIS — I10 Essential (primary) hypertension: Secondary | ICD-10-CM

## 2022-05-08 DIAGNOSIS — N289 Disorder of kidney and ureter, unspecified: Secondary | ICD-10-CM

## 2022-05-08 DIAGNOSIS — D75839 Thrombocytosis, unspecified: Secondary | ICD-10-CM

## 2022-05-08 DIAGNOSIS — E781 Pure hyperglyceridemia: Secondary | ICD-10-CM | POA: Diagnosis not present

## 2022-05-08 DIAGNOSIS — E039 Hypothyroidism, unspecified: Secondary | ICD-10-CM

## 2022-05-08 DIAGNOSIS — E875 Hyperkalemia: Secondary | ICD-10-CM

## 2022-05-08 NOTE — Assessment & Plan Note (Signed)
Hypothyroidism  Pt has no clinical changes No change in energy level/ hair or skin/ edema and no tremor Lab Results  Component Value Date   TSH 3.02 05/02/2022    No longer on cytomel Taking levothyroxine 100 mcg daily  Under care of endo Dr Dwyane Dee Doing well

## 2022-05-08 NOTE — Assessment & Plan Note (Signed)
Lab Results  Component Value Date   HGBA1C 5.8 05/02/2022   Stable disc imp of low glycemic diet and wt loss to prevent DM2

## 2022-05-08 NOTE — Assessment & Plan Note (Signed)
bp in fair control at this time  BP Readings from Last 1 Encounters:  05/08/22 125/88   No changes needed Most recent labs reviewed  Disc lifstyle change with low sodium diet and exercise  Plan to continue Propranolol ER 120 mg daily  Amlodipine 10 mg daily

## 2022-05-08 NOTE — Patient Instructions (Addendum)
Hold the potassium  Contact urology for further advisement  Re check labs in 1-2 week   Avoid fatty foods  Try to get most of your carbohydrates from produce (with the exception of white potatoes)  Eat less bread/pasta/rice/snack foods/cereals/sweets and other items from the middle of the grocery store (processed carbs)   Take care of yourself  Keep exercising

## 2022-05-08 NOTE — Assessment & Plan Note (Signed)
Reviewed health habits including diet and exercise and skin cancer prevention Reviewed appropriate screening tests for age  Also reviewed health mt list, fam hx and immunization status , as well as social and family history   See HPI Labs reviewed  Mammogram utd dexa utd   No falls or fx Colonoscopy utd  Encourage exercise as tolerated

## 2022-05-08 NOTE — Assessment & Plan Note (Signed)
K is 5.2 Cr 1.54 Taking K ditrate 15 meq bid from urology for renal stones  inst to hold Re check lab 1-2 wk

## 2022-05-08 NOTE — Assessment & Plan Note (Signed)
Sees urology for renal stones/did loose fxn of one kidney  Drinks at least 10 glasses of water eaily  K level up at 5.2 (on K citrate)  Cr 1.54  Has seen nephrologist in the past/ Dr Jonnie Finner   Plan to re check bmet in 1-2 weeks

## 2022-05-08 NOTE — Assessment & Plan Note (Signed)
Disc goals for lipids and reasons to control them Rev last labs with pt Rev low sat fat diet in detail Stable trig at 270 Working on diet  Plan to continue fenofibrate 54 mg daily

## 2022-05-08 NOTE — Progress Notes (Signed)
Subjective:    Patient ID: [name], female    DOB: 11/11/1951, 71 y.o.   MRN: [id number]  HPI Here for health maintenance exam and to review chronic medical problems   Wt Readings from Last 3 Encounters:  05/08/22 185 lb 8 oz (84.1 kg)  04/29/22 186 lb (84.4 kg)  02/14/22 186 lb 3.2 oz (84.5 kg)   30.40 kg/m  Feeling great  Better than in years  Thyroid is better - sees Dr Dwyane Dee   Taking care of herself  Working in garden and walking    Immunization History  Administered Date(s) Administered   Influenza Split 08/10/2011   Influenza Whole 10/10/2007, 08/17/2008, 08/31/2009, 08/09/2010   Influenza, High Dose Seasonal PF 07/18/2017, 07/09/2018, 08/06/2019, 08/16/2020, 08/16/2020, 08/17/2020, 07/13/2021   Influenza,inj,Quad PF,6+ Mos 08/04/2015, 09/02/2016   Influenza-Unspecified 08/09/2014   PFIZER Comirnaty(Gray Top)Covid-19 Tri-Sucrose Vaccine 03/09/2021   PFIZER(Purple Top)SARS-COV-2 Vaccination 01/17/2020, 02/09/2020, 09/07/2020, 09/14/2021   PNEUMOCOCCAL CONJUGATE-20 05/24/2021   Pfizer Covid-19 Vaccine Bivalent Booster 22yr & up 09/14/2021   Pneumococcal Conjugate-13 11/28/2015   Pneumococcal Polysaccharide-23 08/25/2003, 10/10/2007   Pneumococcal-Unspecified 11/27/2016   Td 10/21/2006   Tdap 03/20/2015   Unspecified SARS-COV-2 Vaccination 04/24/2022   Zoster Recombinat (Shingrix) 09/04/2018, 12/09/2018   Zoster, Live 03/25/2012   Mammogram 03/2022  Self breast exam- no lumps   Colonoscopy 05/2018 with 10 y recall  Dexa 05/2018-nl bmd   Falls/fx  -none  Supplements  - ca and D   HTN bp is stable today  No cp or palpitations or headaches or edema  No side effects to medicines  BP Readings from Last 3 Encounters:  05/08/22 (!) 142/90  02/14/22 124/88  11/16/21 (!) 154/75     Propranolol ER 120 mg daily  Amlodipine 10 mg   At home 127/76 - well controlled   Pulse Readings from Last 3 Encounters:  05/08/22 71  02/14/22 79  11/16/21 (!) 522   Hypothyroid with h/o hyperthyroidism Hypothyroidism  Pt has no clinical changes No change in energy level/ hair or skin/ edema and no tremor Lab Results  Component Value Date   TSH 3.02 05/02/2022    Levothyroxine 100 mcg daily  Takes is appropriately  No longer on cytomel   Renal insufficiency Lab Results  Component Value Date   CREATININE 1.54 (H) 05/02/2022   BUN 36 (H) 05/02/2022   NA 138 05/02/2022   K 5.2 No hemolysis seen (H) 05/02/2022   CL 100 05/02/2022   CO2 28 05/02/2022  Urologist is giving her potassium - K citrate to prevent kidney stones   Now R kidney does not work after a stone Sees urologist   Drinks lots of water -at least 10 glasses  Saw Dr GJonnie Finnerfor renal in the paste    Thrombocytosis Lab Results  Component Value Date   WBC 6.2 05/02/2022   HGB 13.3 05/02/2022   HCT 40.3 05/02/2022   MCV 86.8 05/02/2022   PLT 468.0 (H) 05/02/2022  No bleeding or bruising   Hyperlipidemia/triglycerides Lab Results  Component Value Date   CHOL 206 (H) 05/02/2022   CHOL 178 07/24/2021   CHOL 176 05/30/2021   Lab Results  Component Value Date   HDL 70.10 05/02/2022   HDL 68.70 07/24/2021   HDL 67.80 05/30/2021   Lab Results  Component Value Date   LDLCALC 68 06/01/2020   LDLCALC 56 04/15/2019   LDLCALC 78 04/04/2017   Lab Results  Component Value Date   TRIG 270.0 (H)  05/02/2022   TRIG 256.0 (H) 07/24/2021   TRIG 283.0 (H) 05/30/2021   Lab Results  Component Value Date   CHOLHDL 3 05/02/2022   CHOLHDL 3 07/24/2021   CHOLHDL 3 05/30/2021   Lab Results  Component Value Date   LDLDIRECT 104.0 05/02/2022   LDLDIRECT 84.0 07/24/2021   LDLDIRECT 77.0 05/30/2021   Fenofibrate 54 mg daily  Diet has been fair-she is trying  Avoids red meat  Some french fries  No shellfish   Prediabetes Lab Results  Component Value Date   HGBA1C 5.8 05/02/2022   Stable - trying to watch the carbs   Patient Active Problem List   Diagnosis  Date Noted   Otitis media 06/28/2021   Hyperkalemia 10/29/2018   Hemorrhoids 10/23/2018   Renal insufficiency 10/23/2018   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    Elevated serum creatinine 04/19/2018   Estrogen deficiency 04/15/2018   Bladder prolapse, female, 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   Dyspepsia 04/14/2017   Colon cancer screening 04/09/2017   Prediabetes 03/30/2017   Medicare annual wellness visit, initial 04/04/2016   Other screening mammogram 12/31/2011   Post-menopausal 12/31/2011   Routine general medical examination at a health care facility 12/23/2011   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   Hypothyroidism 11/08/2008   HSV (herpes simplex virus) infection 09/23/2007   Hypertriglyceridemia 09/23/2007   Essential hypertension 09/23/2007   ALLERGIC RHINITIS 09/23/2007   ASTHMA 09/23/2007   HYPOKALEMIA 04/16/2007   Thrombocytosis ([place]) 04/09/2007   Past Medical History:  Diagnosis Date   Allergic rhinitis    Allergy    Anemia    b12 def.pt. unaware   Asthma    controlled w/ singulair   Herpes simplex without mention of complication    controlled w/ meds (no current fever blisters)   History of hiatal hernia    History of kidney stones    HLD (hyperlipidemia)    borderline-  no meds   HTN (hypertension)    echo- 2006   Hyperthyroidism hx ptu 2006   takes synthroid; had Graves disease, was treated for that- now thyroid doesn't work   Leukocytosis, unspecified hx of 10 yrs ago   no problem since   Migraine    Mixed incontinence    urge and stress incontinence   Renal cyst right    pt is unsure of this, has had kidney stones   Ureteral calculi right    s/p laser  litho w/ stone extraction 01-28-11   Past Surgical History:  Procedure Laterality Date   BIOPSY  10/16/2018   Procedure: BIOPSY;  Surgeon: Milus Banister, MD;  Location: [facility] ENDOSCOPY;  Service: Endoscopy;;   [place]   fusion c4-6   CERVICAL FUSION   2005   fusion c6-7 and plate removal C3-7   COLONOSCOPY     CYSTOSCOPY W/ RETROGRADES  10/21/2011   Procedure: CYSTOSCOPY WITH RETROGRADE PYELOGRAM;  Surgeon: Ailene Rud, MD;  Location: [place];  Service: Urology;  Laterality: Right;  CYSTOSCOPY RIGHT RETROGRADE, PYELOGRAM  URETEROSCOPY WITH HOLMIUM LASER AND STONE EXTRACTION   CYSTOSCOPY/RETROGRADE/URETEROSCOPY/STONE EXTRACTION WITH BASKET  01-28-11   right    CYSTOSCOPY/URETEROSCOPY/HOLMIUM LASER/STENT PLACEMENT Right 11/16/2021   Procedure: CYSTOSCOPY/RETROGRADE/URETEROSCOPY/HOLMIUM LASER/STENT PLACEMENT;  Surgeon: Janith Lima, MD;  Location: [facility] ORS;  Service: Urology;  Laterality: Right;   ESOPHAGOGASTRODUODENOSCOPY (EGD) WITH PROPOFOL N/A 10/16/2018   Procedure: ESOPHAGOGASTRODUODENOSCOPY (EGD) WITH PROPOFOL;  Surgeon: Milus Banister, MD;  Location:  [facility] ENDOSCOPY;  Service: Endoscopy;  Laterality: N/A;   REDUCTION MAMMAPLASTY Bilateral 1995   TONSILLECTOMY  1960   VAGINAL HYSTERECTOMY  1987   fibroids/anemia   Social History   Tobacco Use   Smoking status: Never   Smokeless tobacco: Never  Vaping Use   Vaping Use: Never used  Substance Use Topics   Alcohol use: No    Alcohol/week: 0.0 standard drinks   Drug use: No   Family History  Problem Relation Age of Onset   Osteoporosis Mother    Diabetes Mother    Heart disease Father    Heart failure Father    Diabetes Other        Grandfather   Coronary artery disease Other        Grandmother   Breast cancer Neg Hx    Colon cancer Neg Hx    Esophageal cancer Neg Hx    Stomach cancer Neg Hx    Rectal cancer Neg Hx    Thyroid disease Neg Hx    Allergies  Allergen Reactions   Contrast Media [Iodinated Contrast Media] Anaphylaxis   Shellfish-Derived Products Anaphylaxis   Septra [Sulfamethoxazole-Trimethoprim] Nausea Only   Current Outpatient Medications on File Prior to Visit  Medication Sig Dispense Refill   acetaminophen (TYLENOL) 500  MG tablet Take 1,000 mg by mouth every 6 (six) hours as needed for moderate pain.     amLODipine (NORVASC) 10 MG tablet Take 1 tablet (10 mg total) by mouth daily. 90 tablet 0   Calcium Carb-Cholecalciferol (CALCIUM 500/D PO) Take 1 tablet by mouth daily.     Cyanocobalamin (B-12 PO) Take 1 capsule by mouth daily.     fenofibrate 54 MG tablet TAKE 1 TABLET BY MOUTH  DAILY 90 tablet 0   levothyroxine (SYNTHROID) 100 MCG tablet Take 1 tablet (100 mcg total) by mouth daily before breakfast. 90 tablet 2   montelukast (SINGULAIR) 10 MG tablet TAKE 1 TABLET BY MOUTH  DAILY 90 tablet 3   Multiple Vitamin (MULTIVITAMIN) tablet Take 1 tablet by mouth daily.     MYRBETRIQ 50 MG TB24 tablet TAKE 1 TABLET BY MOUTH  DAILY 90 tablet 3   Potassium Citrate 15 MEQ (1620 MG) TBCR Take 1 tablet by mouth 2 (two) times daily.     propranolol ER (INDERAL LA) 120 MG 24 hr capsule TAKE 1 CAPSULE BY MOUTH  DAILY 90 capsule 3   SUMAtriptan (IMITREX) 100 MG tablet TAKE 1 TABLET FOR HEADACHE MAY REPEAT ONCE IN 2 HOURS IF NEEDED. MAXIMUM OF 2    TABLETS IN ONE DAY. (Patient taking differently: Take 100 mg by mouth as needed for migraine or headache. TAKE 100 mg TABLET FOR HEADACHE MAY REPEAT ONCE IN 2 HOURS IF NEEDED. MAXIMUM OF 200 mg    TABLETS IN ONE DAY.) 27 tablet 3   No current facility-administered medications on file prior to visit.    Review of Systems  Constitutional:  Negative for activity change, appetite change, fatigue, fever and unexpected weight change.  HENT:  Negative for congestion, ear pain, rhinorrhea, sinus pressure and sore throat.   Eyes:  Negative for pain, redness and visual disturbance.  Respiratory:  Negative for cough, shortness of breath and wheezing.   Cardiovascular:  Negative for chest pain and palpitations.  Gastrointestinal:  Negative for abdominal pain, blood in stool, constipation and diarrhea.  Endocrine: Negative for polydipsia and polyuria.  Genitourinary:  Negative for dysuria,  frequency and urgency.  Musculoskeletal:  Negative for arthralgias, back  pain and myalgias.  Skin:  Negative for pallor and rash.  Allergic/Immunologic: Negative for environmental allergies.  Neurological:  Negative for dizziness, syncope and headaches.  Hematological:  Negative for adenopathy. Does not bruise/bleed easily.  Psychiatric/Behavioral:  Negative for decreased concentration and dysphoric mood. The patient is not nervous/anxious.       Objective:   Physical Exam Constitutional:      General: She is not in acute distress.    Appearance: Normal appearance. She is well-developed. She is obese. She is not ill-appearing or diaphoretic.  HENT:     Head: Normocephalic and atraumatic.     Right Ear: Tympanic membrane, ear canal and external ear normal.     Left Ear: Tympanic membrane, ear canal and external ear normal.     Nose: Nose normal. No congestion.     Mouth/Throat:     Mouth: Mucous membranes are moist.     Pharynx: Oropharynx is clear. No posterior oropharyngeal erythema.  Eyes:     General: No scleral icterus.    Extraocular Movements: Extraocular movements intact.     Conjunctiva/sclera: Conjunctivae normal.     Pupils: Pupils are equal, round, and reactive to light.  Neck:     Thyroid: No thyromegaly.     Vascular: No carotid bruit or JVD.  Cardiovascular:     Rate and Rhythm: Normal rate and regular rhythm.     Pulses: Normal pulses.     Heart sounds: Normal heart sounds.    No gallop.  Pulmonary:     Effort: Pulmonary effort is normal. No respiratory distress.     Breath sounds: Normal breath sounds. No wheezing.     Comments: Good air exch Chest:     Chest wall: No tenderness.  Abdominal:     General: Bowel sounds are normal. There is no distension or abdominal bruit.     Palpations: Abdomen is soft. There is no mass.     Tenderness: There is no abdominal tenderness.     Hernia: No hernia is present.  Genitourinary:    Comments: Breast exam: No mass,  nodules, thickening, tenderness, bulging, retraction, inflamation, nipple discharge or skin changes noted.  No axillary or clavicular LA.     Musculoskeletal:        General: No tenderness. Normal range of motion.     Cervical back: Normal range of motion and neck supple. No rigidity. No muscular tenderness.     Right lower leg: No edema.     Left lower leg: No edema.     Comments: No kyphosis   Lymphadenopathy:     Cervical: No cervical adenopathy.  Skin:    General: Skin is warm and dry.     Coloration: Skin is not pale.     Findings: No erythema or rash.     Comments: Solar lentigines diffusely Some sks   Neurological:     Mental Status: She is alert. Mental status is at baseline.     Cranial Nerves: No cranial nerve deficit.     Motor: No abnormal muscle tone.     Coordination: Coordination normal.     Gait: Gait normal.     Deep Tendon Reflexes: Reflexes are normal and symmetric. Reflexes normal.  Psychiatric:        Mood and Affect: Mood normal.        Cognition and Memory: Cognition and memory normal.          Assessment & Plan:   Problem List Items Addressed  This Visit       Cardiovascular and Mediastinum   Essential hypertension    bp in fair control at this time  BP Readings from Last 1 Encounters:  05/08/22 125/88  No changes needed Most recent labs reviewed  Disc lifstyle change with low sodium diet and exercise  Plan to continue Propranolol ER 120 mg daily  Amlodipine 10 mg daily          Endocrine   Hypothyroidism    Hypothyroidism  Pt has no clinical changes No change in energy level/ hair or skin/ edema and no tremor Lab Results  Component Value Date   TSH 3.02 05/02/2022    No longer on cytomel Taking levothyroxine 100 mcg daily  Under care of endo Dr Dwyane Dee Doing well        Genitourinary   Renal insufficiency    Sees urology for renal stones/did loose fxn of one kidney  Drinks at least 10 glasses of water eaily  K level up at  5.2 (on K citrate)  Cr 1.54  Has seen nephrologist in the past/ Dr Jonnie Finner   Plan to re check bmet in 1-2 weeks          Hematopoietic and Hemostatic   Thrombocytosis (HCC)    Improved pl ct at 468 No excess bruising or bleeding         Other   Hyperkalemia    K is 5.2 Cr 1.54 Taking K ditrate 15 meq bid from urology for renal stones  inst to hold Re check lab 1-2 wk        Hypertriglyceridemia    Disc goals for lipids and reasons to control them Rev last labs with pt Rev low sat fat diet in detail Stable trig at 270 Working on diet  Plan to continue fenofibrate 54 mg daily       Prediabetes    Lab Results  Component Value Date   HGBA1C 5.8 05/02/2022  Stable disc imp of low glycemic diet and wt loss to prevent DM2       Routine general medical examination at a health care facility - Primary    Reviewed health habits including diet and exercise and skin cancer prevention Reviewed appropriate screening tests for age  Also reviewed health mt list, fam hx and immunization status , as well as social and family history   See HPI Labs reviewed  Mammogram utd dexa utd   No falls or fx Colonoscopy utd  Encourage exercise as tolerated

## 2022-05-08 NOTE — Assessment & Plan Note (Signed)
Improved pl ct at 468 No excess bruising or bleeding

## 2022-05-15 ENCOUNTER — Other Ambulatory Visit (INDEPENDENT_AMBULATORY_CARE_PROVIDER_SITE_OTHER): Payer: Medicare Other

## 2022-05-15 DIAGNOSIS — E875 Hyperkalemia: Secondary | ICD-10-CM

## 2022-05-15 LAB — BASIC METABOLIC PANEL
BUN: 19 mg/dL (ref 6–23)
CO2: 24 mEq/L (ref 19–32)
Calcium: 9 mg/dL (ref 8.4–10.5)
Chloride: 104 mEq/L (ref 96–112)
Creatinine, Ser: 1.28 mg/dL — ABNORMAL HIGH (ref 0.40–1.20)
GFR: 42.16 mL/min — ABNORMAL LOW (ref >60.00)
Glucose, Bld: 97 mg/dL (ref 70–99)
Potassium: 4.3 mEq/L (ref 3.5–5.1)
Sodium: 139 mEq/L (ref 135–145)

## 2022-06-24 DIAGNOSIS — N261 Atrophy of kidney (terminal): Secondary | ICD-10-CM | POA: Diagnosis not present

## 2022-06-24 DIAGNOSIS — N200 Calculus of kidney: Secondary | ICD-10-CM | POA: Diagnosis not present

## 2022-07-09 DIAGNOSIS — N1832 Chronic kidney disease, stage 3b: Secondary | ICD-10-CM | POA: Diagnosis not present

## 2022-07-09 DIAGNOSIS — N390 Urinary tract infection, site not specified: Secondary | ICD-10-CM | POA: Diagnosis not present

## 2022-07-09 DIAGNOSIS — N200 Calculus of kidney: Secondary | ICD-10-CM | POA: Diagnosis not present

## 2022-07-09 DIAGNOSIS — I129 Hypertensive chronic kidney disease with stage 1 through stage 4 chronic kidney disease, or unspecified chronic kidney disease: Secondary | ICD-10-CM | POA: Diagnosis not present

## 2022-07-15 DIAGNOSIS — H902 Conductive hearing loss, unspecified: Secondary | ICD-10-CM | POA: Diagnosis not present

## 2022-07-15 DIAGNOSIS — H6123 Impacted cerumen, bilateral: Secondary | ICD-10-CM | POA: Diagnosis not present

## 2022-07-21 ENCOUNTER — Other Ambulatory Visit: Payer: Self-pay | Admitting: Endocrinology

## 2022-07-21 DIAGNOSIS — I10 Essential (primary) hypertension: Secondary | ICD-10-CM

## 2022-07-24 ENCOUNTER — Other Ambulatory Visit: Payer: Self-pay | Admitting: Family Medicine

## 2022-08-19 ENCOUNTER — Other Ambulatory Visit (INDEPENDENT_AMBULATORY_CARE_PROVIDER_SITE_OTHER): Payer: Medicare Other

## 2022-08-19 DIAGNOSIS — E890 Postprocedural hypothyroidism: Secondary | ICD-10-CM

## 2022-08-19 LAB — TSH: TSH: 0.81 u[IU]/mL (ref 0.35–5.50)

## 2022-08-19 LAB — T4, FREE: Free T4: 1.15 ng/dL (ref 0.60–1.60)

## 2022-08-22 ENCOUNTER — Ambulatory Visit: Payer: Medicare Other | Admitting: Endocrinology

## 2022-08-22 ENCOUNTER — Encounter: Payer: Self-pay | Admitting: Endocrinology

## 2022-08-22 VITALS — BP 118/70 | HR 70 | Ht 65.5 in | Wt 191.2 lb

## 2022-08-22 DIAGNOSIS — E890 Postprocedural hypothyroidism: Secondary | ICD-10-CM | POA: Diagnosis not present

## 2022-08-22 NOTE — Progress Notes (Signed)
Patient ID: [name], female   DOB: 12-05-1951, 71 y.o.   MRN: [id number]            Referring Provider: Dr. Thea Alken  Reason for Appointment:  Hypothyroidism, follow-up visit    History of Present Illness:   Hypothyroidism was first diagnosed in 2006  At the time of diagnosis patient had radioactive iron treatment for reportedly Graves' disease  With her Graves' disease she had symptoms of palpitations and increased blood pressure as well as some chest discomfort and was diagnosed during her hospitalization  Initially may have been given PTU but subsequently had I-131 treatment resulting in hypothyroidism  The patient has been treated with levothyroxine and liothyronine by her previous endocrinologist since she was diagnosed as hypothyroid Although she had been generally taking the same regimen for years she has had difficulty maintaining normal levels for the last 3 years or so She thinks this was subsequent to a respiratory viral infection  In early 2022 she was taking 75 mcg of levothyroxine along with 12.5 mcg of liothyronine However because of persistently higher TSH levels dose of levothyroxine was gradually increased all the way up to 125 mcg in March However at that time her TSH was high she was feeling good with normal energy level When her TSH was slightly low in 5/22 her dose of levothyroxine was reduced to 100 mcg; lab work about a month later showed continuously suppressed TSH She says that with increasing her levothyroxine dose she has had more feeling of fatigue and sometimes somnolence  RECENT history:  On her initial visit she was complaining of fatigue and some limitations but not palpitations  With her TSH being low and free T3 level high her dose was adjusted sequentially In 11/22 she was having more fatigue sweating and and somnolence Because of her T3 level being high she was told to stop liothyronine completely  Subsequently she felt significantly  better Again recently she has been feeling quite well with no fatigue or lethargy Has some fluctuation of her weight No palpitations  She has been taking levothyroxine before breakfast consistently Continues to be taking 100 mcg levothyroxine  Thyroid levels are back to normal consistently now  Patient's weight history is as follows:  Wt Readings from Last 3 Encounters:  08/22/22 191 lb 3.2 oz (86.7 kg)  05/08/22 185 lb 8 oz (84.1 kg)  04/29/22 186 lb (84.4 kg)    Thyroid function results have been as follows:  Lab Results  Component Value Date   TSH 0.81 08/19/2022   TSH 3.02 05/02/2022   TSH 1.63 02/11/2022   TSH 0.62 11/09/2021   FREET4 1.15 08/19/2022   FREET4 1.17 02/11/2022   FREET4 1.05 11/09/2021   FREET4 0.89 10/05/2021   T3FREE 3.1 02/11/2022   T3FREE 3.8 11/09/2021   T3FREE 4.5 (H) 08/29/2021     Past Medical History:  Diagnosis Date   Allergic rhinitis    Allergy    Anemia    b12 def.pt. unaware   Asthma    controlled w/ singulair   Herpes simplex without mention of complication    controlled w/ meds (no current fever blisters)   History of hiatal hernia    History of kidney stones    HLD (hyperlipidemia)    borderline-  no meds   HTN (hypertension)    echo- 2006   Hyperthyroidism hx ptu 2006   takes synthroid; had Graves disease, was treated for that- now thyroid doesn't work  Leukocytosis, unspecified hx of 10 yrs ago   no problem since   Migraine    Mixed incontinence    urge and stress incontinence   Renal cyst right    pt is unsure of this, has had kidney stones   Ureteral calculi right    s/p laser  litho w/ stone extraction 01-28-11    Past Surgical History:  Procedure Laterality Date   BIOPSY  10/16/2018   Procedure: BIOPSY;  Surgeon: Milus Banister, MD;  Location: [facility] ENDOSCOPY;  Service: Endoscopy;;   [place]   fusion c4-6   CERVICAL FUSION  2005   fusion c6-7 and plate  removal W0-9   COLONOSCOPY     CYSTOSCOPY W/ RETROGRADES  10/21/2011   Procedure: CYSTOSCOPY WITH RETROGRADE PYELOGRAM;  Surgeon: Ailene Rud, MD;  Location: [facility];  Service: Urology;  Laterality: Right;  CYSTOSCOPY RIGHT RETROGRADE, PYELOGRAM  URETEROSCOPY WITH HOLMIUM LASER AND STONE EXTRACTION   CYSTOSCOPY/RETROGRADE/URETEROSCOPY/STONE EXTRACTION WITH BASKET  01-28-11   right    CYSTOSCOPY/URETEROSCOPY/HOLMIUM LASER/STENT PLACEMENT Right 11/16/2021   Procedure: CYSTOSCOPY/RETROGRADE/URETEROSCOPY/HOLMIUM LASER/STENT PLACEMENT;  Surgeon: Janith Lima, MD;  Location: [facility];  Service: Urology;  Laterality: Right;   ESOPHAGOGASTRODUODENOSCOPY (EGD) WITH PROPOFOL N/A 10/16/2018   Procedure: ESOPHAGOGASTRODUODENOSCOPY (EGD) WITH PROPOFOL;  Surgeon: Milus Banister, MD;  Location: [facility] ENDOSCOPY;  Service: Endoscopy;  Laterality: N/A;   REDUCTION MAMMAPLASTY Bilateral 1995   TONSILLECTOMY  1960   VAGINAL HYSTERECTOMY  1987   fibroids/anemia    Family History  Problem Relation Age of Onset   Osteoporosis Mother    Diabetes Mother    Heart disease Father    Heart failure Father    Diabetes Other        Grandfather   Coronary artery disease Other        Grandmother   Breast cancer Neg Hx    Colon cancer Neg Hx    Esophageal cancer Neg Hx    Stomach cancer Neg Hx    Rectal cancer Neg Hx    Thyroid disease Neg Hx     Social History:  reports that she has never smoked. She has never used smokeless tobacco. She reports that she does not drink alcohol and does not use drugs.  Allergies:  Allergies  Allergen Reactions   Contrast Media [Iodinated Contrast Media] Anaphylaxis   Shellfish-Derived Products Anaphylaxis   Septra [Sulfamethoxazole-Trimethoprim] Nausea Only    Allergies as of 08/22/2022       Reactions   Contrast Media [iodinated Contrast Media] Anaphylaxis   Shellfish-derived Products Anaphylaxis   Septra [sulfamethoxazole-trimethoprim] Nausea  Only        Medication List        Accurate as of August 22, 2022 10:30 AM. If you have any questions, ask your nurse or doctor.          acetaminophen 500 MG tablet Commonly known as: TYLENOL Take 1,000 mg by mouth every 6 (six) hours as needed for moderate pain.   amLODipine 10 MG tablet Commonly known as: NORVASC TAKE 1 TABLET BY MOUTH EVERY DAY   B-12 PO Take 1 capsule by mouth daily.   CALCIUM 500/D PO Take 1 tablet by mouth daily.   fenofibrate 54 MG tablet TAKE 1 TABLET BY MOUTH DAILY   levothyroxine 100 MCG tablet Commonly known as: Synthroid Take 1 tablet (100 mcg total) by mouth daily before breakfast.   montelukast 10 MG tablet  Commonly known as: SINGULAIR TAKE 1 TABLET BY MOUTH  DAILY   multivitamin tablet Take 1 tablet by mouth daily.   Myrbetriq 50 MG Tb24 tablet Generic drug: mirabegron ER TAKE 1 TABLET BY MOUTH  DAILY   Potassium Citrate 15 MEQ (1620 MG) Tbcr Take 1 tablet by mouth 2 (two) times daily.   propranolol ER 120 MG 24 hr capsule Commonly known as: INDERAL LA TAKE 1 CAPSULE BY MOUTH  DAILY   SUMAtriptan 100 MG tablet Commonly known as: IMITREX TAKE 1 TABLET FOR HEADACHE MAY REPEAT ONCE IN 2 HOURS IF NEEDED. MAXIMUM OF 2    TABLETS IN ONE DAY. What changed:  how much to take how to take this when to take this reasons to take this additional instructions           Review of Systems  HYPERTENSION: Managed by PCP, taking amlodipine 10 mg and also propranolol 120       BP Readings from Last 3 Encounters:  08/22/22 118/70  05/08/22 125/88  02/14/22 124/88          History of hydronephrosis and right atrophic kidney  Lab Results  Component Value Date   CREATININE 1.28 (H) 05/15/2022   CREATININE 1.54 (H) 05/02/2022   CREATININE 1.10 (H) 10/24/2021      Examination:    BP 118/70   Pulse 70   Ht 5' 5.5" (1.664 m)   Wt 191 lb 3.2 oz (86.7 kg)   SpO2 98%   BMI 31.33 kg/m     Assessment:  HYPOTHYROIDISM, post radioactive iodine treatment for Graves' disease several years ago  Current regimen is 100 mcg of Synthroid With this she has had no unusual fatigue or lethargy, previously had not done as well with combination with Cytomel She is quite regular with her supplement and takes it by itself in the morning before eating   PLAN:    Continue 100 mcg SYNTHROID as monotherapy She feels like she wants to continue on brand-name Synthroid Since she is likely playing significant co-pay she was given information on Synthroid delivers program and she will look into this  For now she can follow-up with PCP and come back as needed   Elayne Snare 08/22/2022, 10:30 AM    Note: This office note was prepared with Dragon voice recognition system technology. Any transcriptional errors that result from this process are unintentional.

## 2022-08-26 ENCOUNTER — Telehealth: Payer: Self-pay | Admitting: *Deleted

## 2022-08-26 ENCOUNTER — Encounter: Payer: Self-pay | Admitting: Family Medicine

## 2022-08-26 NOTE — Telephone Encounter (Signed)
Pt wants to know if she should get the RSV vaccine, please advise

## 2022-08-26 NOTE — Telephone Encounter (Signed)
It is an optional vaccine over 60.  More important for folks with immune issues or with lung disease but she can get it if she wants to.  I don't think it is a bad idea, the virus is fairly common.

## 2022-08-26 NOTE — Telephone Encounter (Signed)
Sent mychart letting pt know  ?

## 2022-08-26 NOTE — Telephone Encounter (Signed)
Chart updated and message sent to PCP asking about RSV vaccine

## 2022-10-13 ENCOUNTER — Encounter: Payer: Self-pay | Admitting: Family Medicine

## 2022-11-17 IMAGING — MG MM DIGITAL SCREENING BILAT W/ TOMO AND CAD
8 series · 8 of 24 positions shown · non-contrast
Comparison: Previous exam(s).

CLINICAL DATA: Screening.

EXAM:
DIGITAL SCREENING BILATERAL MAMMOGRAM WITH TOMOSYNTHESIS AND CAD
TECHNIQUE: Bilateral screening digital craniocaudal and mediolateral oblique
mammograms were obtained. Bilateral screening digital breast
tomosynthesis was performed. The images were evaluated with
computer-aided detection.

[L MLO synth-2D]
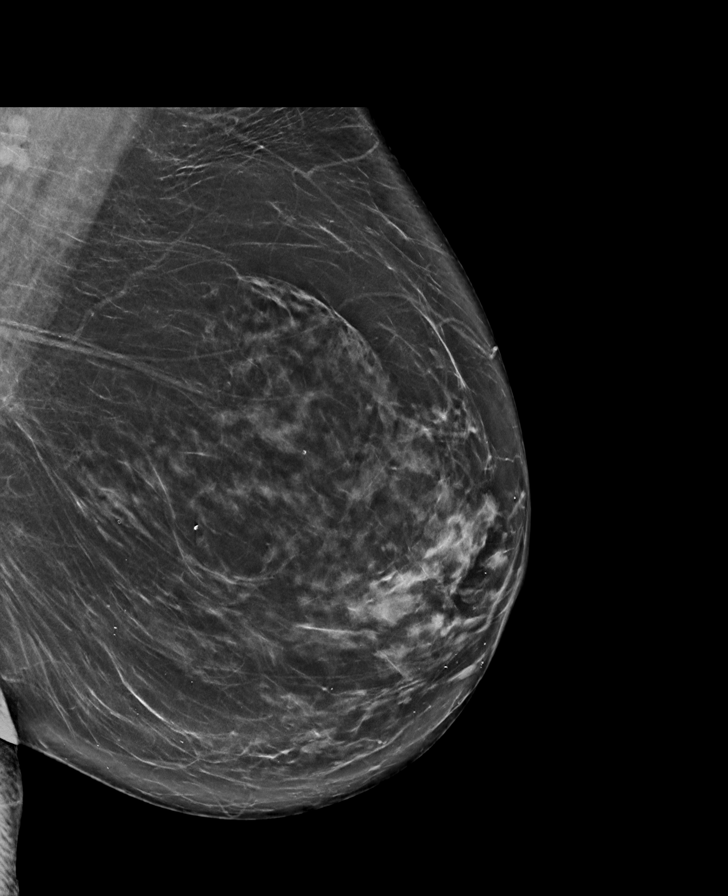

[R MLO synth-2D]
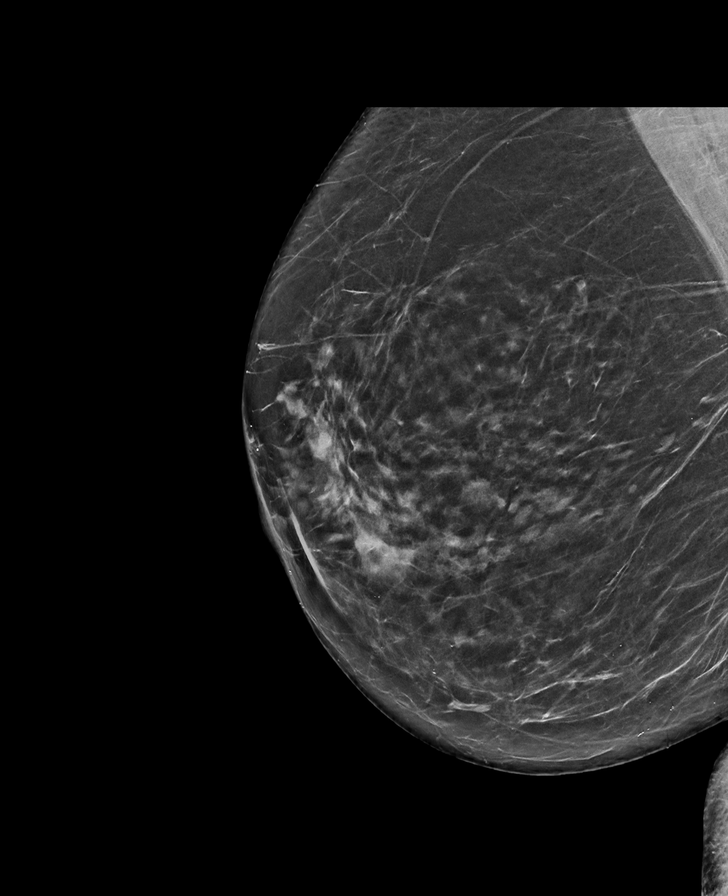

[R CC synth-2D]
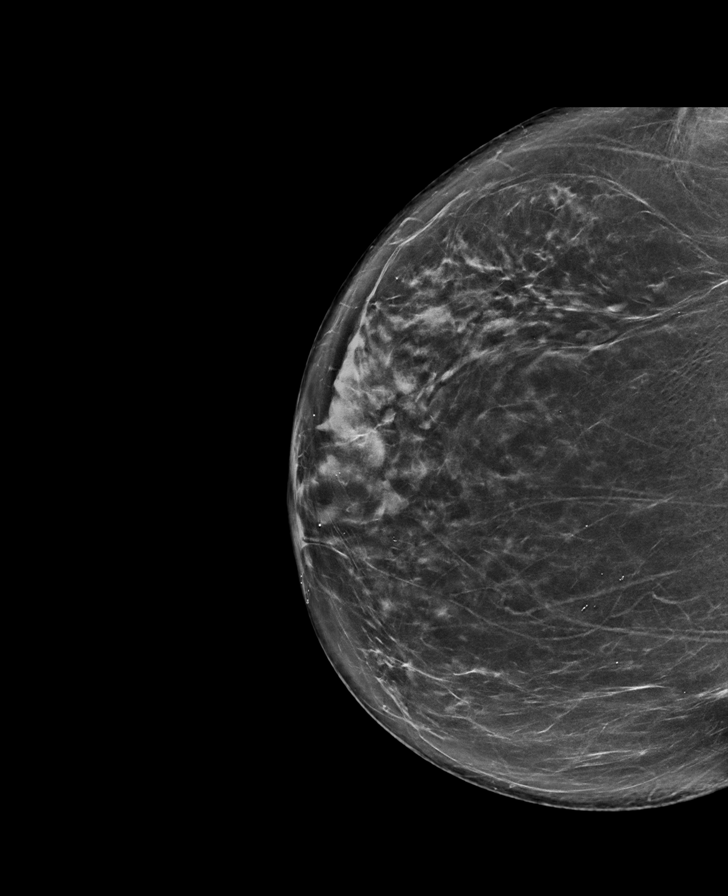

[L CC synth-2D]
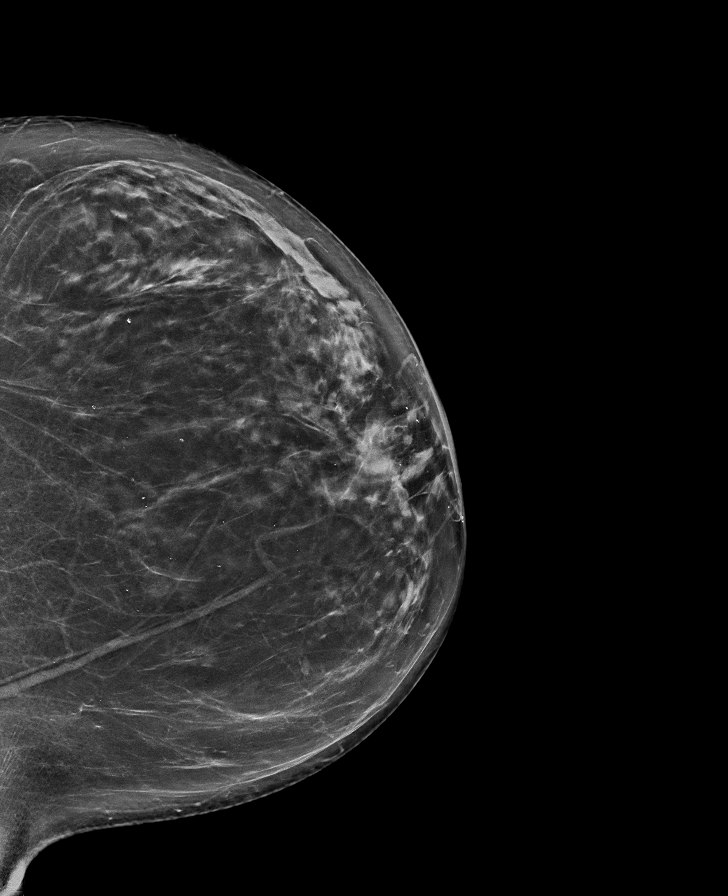

[R MLO tomo · tomo slice 39/78.0]
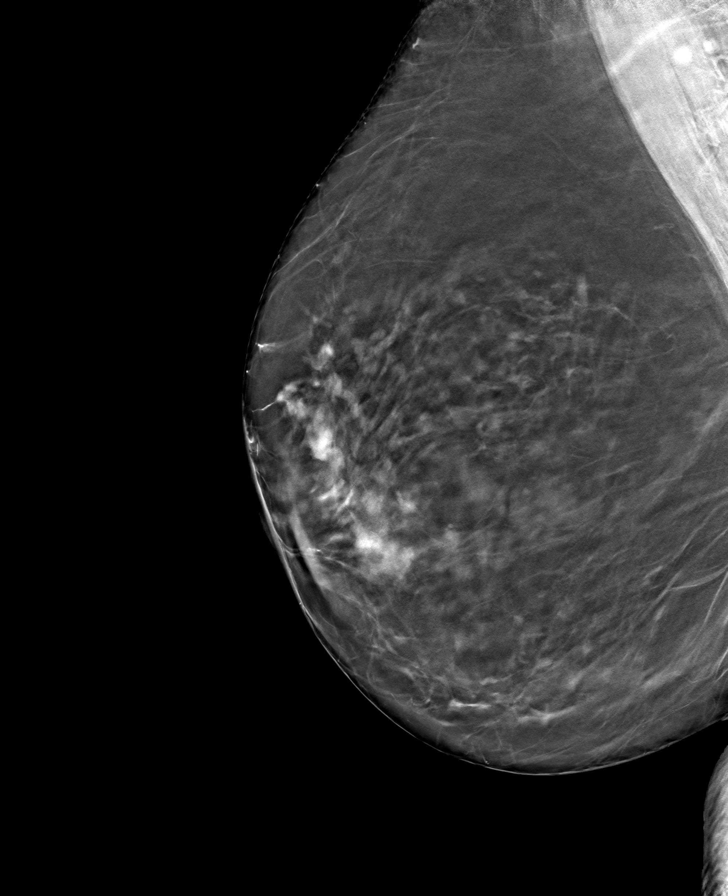

[L CC tomo · tomo slice 45/88.0]
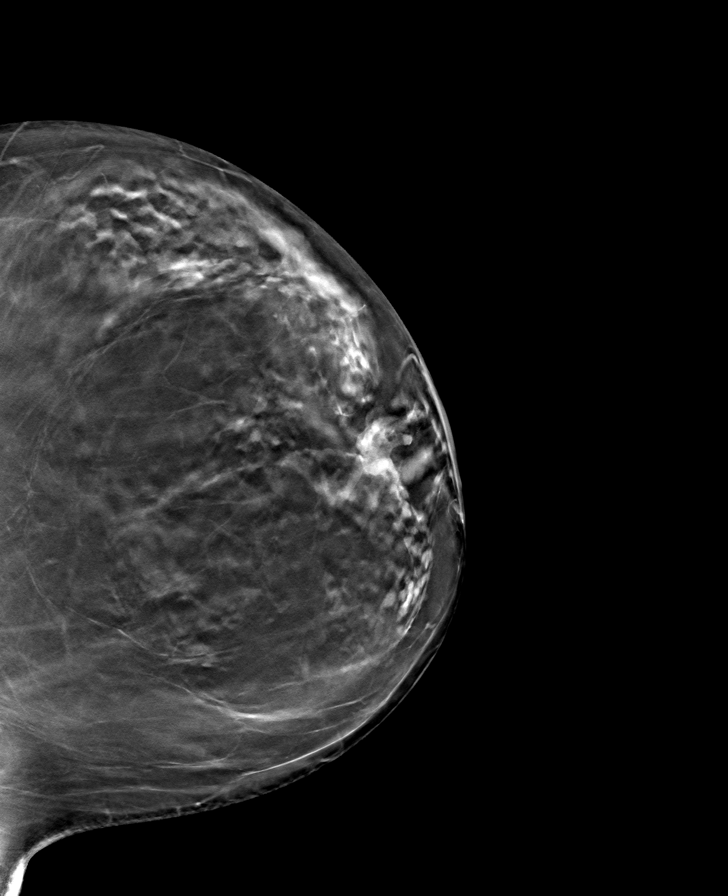

[L MLO tomo · tomo slice 43/86.0]
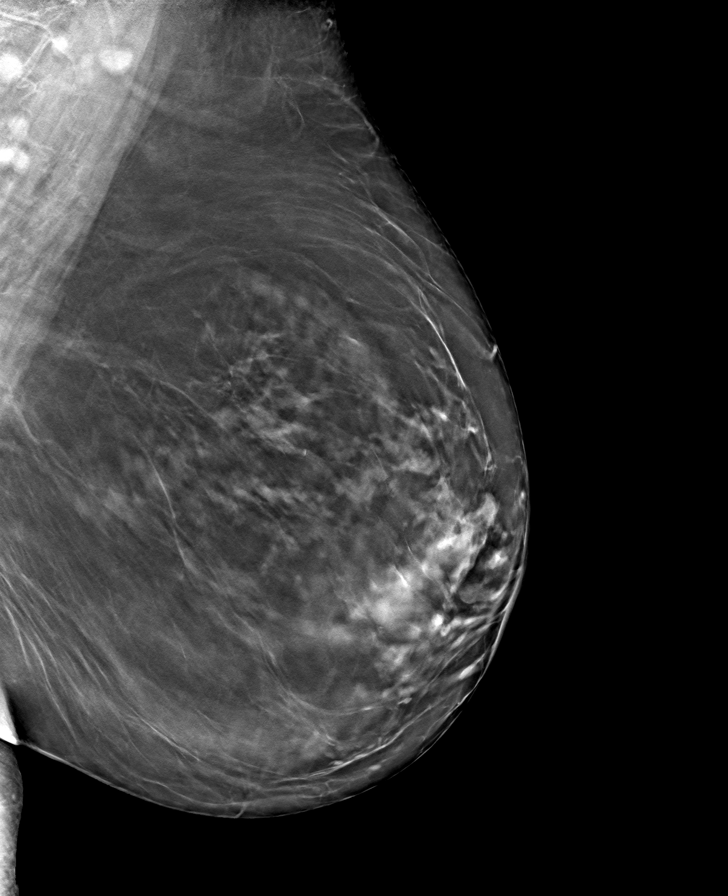

[R CC tomo · tomo slice 42/83.0]
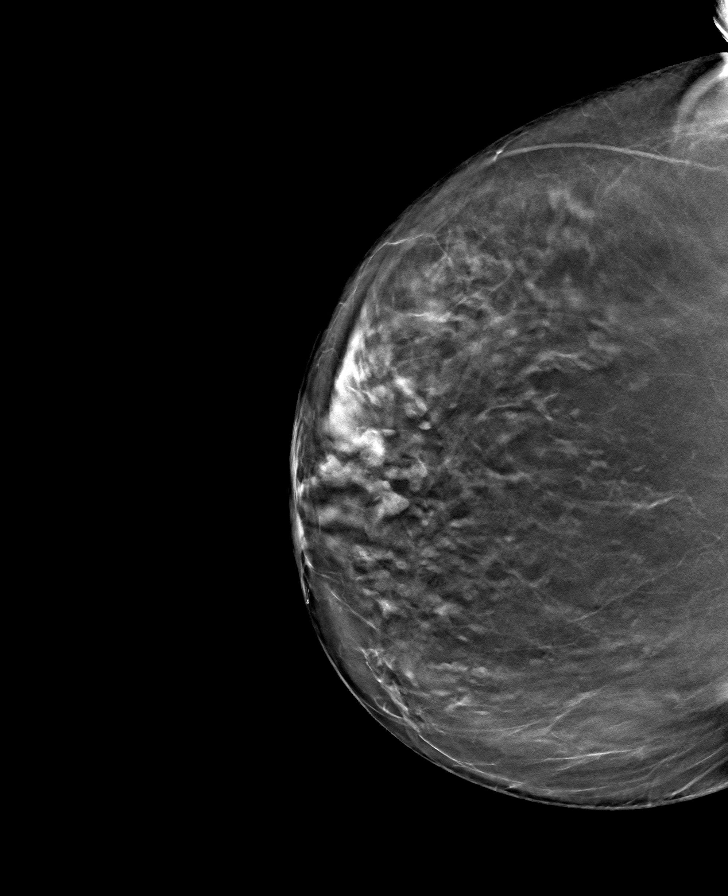

[8 of 24 positions shown; findings below may reference images not displayed]

ACR Breast Density Category c: The breast tissue is heterogeneously
dense, which may obscure small masses.
FINDINGS: There are no findings suspicious for malignancy. The images were
evaluated with computer-aided detection.
IMPRESSION: No mammographic evidence of malignancy. A result letter of this
screening mammogram will be mailed directly to the patient.

RECOMMENDATION:
Screening mammogram in one year. (Code:T4-5-GWO)

BI-RADS CATEGORY  1: Negative.

## 2022-11-27 ENCOUNTER — Other Ambulatory Visit: Payer: Self-pay | Admitting: Family Medicine

## 2023-01-30 DIAGNOSIS — H902 Conductive hearing loss, unspecified: Secondary | ICD-10-CM | POA: Diagnosis not present

## 2023-01-30 DIAGNOSIS — H6123 Impacted cerumen, bilateral: Secondary | ICD-10-CM | POA: Diagnosis not present

## 2023-02-02 ENCOUNTER — Other Ambulatory Visit: Payer: Self-pay | Admitting: Family Medicine

## 2023-02-19 DIAGNOSIS — D485 Neoplasm of uncertain behavior of skin: Secondary | ICD-10-CM | POA: Diagnosis not present

## 2023-02-19 DIAGNOSIS — L820 Inflamed seborrheic keratosis: Secondary | ICD-10-CM | POA: Diagnosis not present

## 2023-03-03 ENCOUNTER — Other Ambulatory Visit: Payer: Self-pay | Admitting: Family Medicine

## 2023-03-03 DIAGNOSIS — Z1231 Encounter for screening mammogram for malignant neoplasm of breast: Secondary | ICD-10-CM

## 2023-03-19 DIAGNOSIS — C44321 Squamous cell carcinoma of skin of nose: Secondary | ICD-10-CM | POA: Diagnosis not present

## 2023-03-26 DIAGNOSIS — C44321 Squamous cell carcinoma of skin of nose: Secondary | ICD-10-CM | POA: Diagnosis not present

## 2023-04-06 ENCOUNTER — Other Ambulatory Visit: Payer: Self-pay | Admitting: Family Medicine

## 2023-04-08 DIAGNOSIS — C44321 Squamous cell carcinoma of skin of nose: Secondary | ICD-10-CM | POA: Diagnosis not present

## 2023-04-09 HISTORY — PX: MOHS SURGERY: SUR867

## 2023-04-10 ENCOUNTER — Other Ambulatory Visit: Payer: Self-pay | Admitting: Endocrinology

## 2023-04-10 DIAGNOSIS — I10 Essential (primary) hypertension: Secondary | ICD-10-CM

## 2023-04-15 ENCOUNTER — Ambulatory Visit
Admission: RE | Admit: 2023-04-15 | Discharge: 2023-04-15 | Disposition: A | Discharge status: Home / Self Care | Payer: Medicare Other | Source: Ambulatory Visit | Attending: Family Medicine | Admitting: Family Medicine

## 2023-04-15 DIAGNOSIS — Z1231 Encounter for screening mammogram for malignant neoplasm of breast: Secondary | ICD-10-CM | POA: Diagnosis not present

## 2023-04-16 ENCOUNTER — Other Ambulatory Visit: Payer: Self-pay | Admitting: Family Medicine

## 2023-04-28 ENCOUNTER — Other Ambulatory Visit: Payer: Self-pay | Admitting: Family Medicine

## 2023-04-29 DIAGNOSIS — C44321 Squamous cell carcinoma of skin of nose: Secondary | ICD-10-CM | POA: Diagnosis not present

## 2023-04-29 DIAGNOSIS — L989 Disorder of the skin and subcutaneous tissue, unspecified: Secondary | ICD-10-CM | POA: Diagnosis not present

## 2023-05-01 DIAGNOSIS — Z85828 Personal history of other malignant neoplasm of skin: Secondary | ICD-10-CM | POA: Diagnosis not present

## 2023-05-01 DIAGNOSIS — Z481 Encounter for planned postprocedural wound closure: Secondary | ICD-10-CM | POA: Diagnosis not present

## 2023-05-01 DIAGNOSIS — C44311 Basal cell carcinoma of skin of nose: Secondary | ICD-10-CM | POA: Diagnosis not present

## 2023-05-01 DIAGNOSIS — Z483 Aftercare following surgery for neoplasm: Secondary | ICD-10-CM | POA: Diagnosis not present

## 2023-05-01 DIAGNOSIS — M950 Acquired deformity of nose: Secondary | ICD-10-CM | POA: Diagnosis not present

## 2023-05-01 DIAGNOSIS — Z9889 Other specified postprocedural states: Secondary | ICD-10-CM | POA: Diagnosis not present

## 2023-05-05 ENCOUNTER — Telehealth: Payer: Self-pay | Admitting: Family Medicine

## 2023-05-05 DIAGNOSIS — I10 Essential (primary) hypertension: Secondary | ICD-10-CM

## 2023-05-05 DIAGNOSIS — D75839 Thrombocytosis, unspecified: Secondary | ICD-10-CM

## 2023-05-05 DIAGNOSIS — E039 Hypothyroidism, unspecified: Secondary | ICD-10-CM

## 2023-05-05 DIAGNOSIS — E781 Pure hyperglyceridemia: Secondary | ICD-10-CM

## 2023-05-05 DIAGNOSIS — R7303 Prediabetes: Secondary | ICD-10-CM

## 2023-05-05 NOTE — Telephone Encounter (Signed)
-----   Message from Alvina Chou sent at 04/21/2023 12:48 PM EDT ----- Regarding: Lab orders for Tuesday, 5.28.24 Patient is scheduled for CPX labs, please order future labs, Thanks , Camelia Eng

## 2023-05-06 ENCOUNTER — Other Ambulatory Visit (INDEPENDENT_AMBULATORY_CARE_PROVIDER_SITE_OTHER): Payer: Medicare Other

## 2023-05-06 DIAGNOSIS — E781 Pure hyperglyceridemia: Secondary | ICD-10-CM | POA: Diagnosis not present

## 2023-05-06 DIAGNOSIS — R7303 Prediabetes: Secondary | ICD-10-CM

## 2023-05-06 DIAGNOSIS — I10 Essential (primary) hypertension: Secondary | ICD-10-CM | POA: Diagnosis not present

## 2023-05-06 DIAGNOSIS — D75839 Thrombocytosis, unspecified: Secondary | ICD-10-CM | POA: Diagnosis not present

## 2023-05-06 DIAGNOSIS — E039 Hypothyroidism, unspecified: Secondary | ICD-10-CM | POA: Diagnosis not present

## 2023-05-06 DIAGNOSIS — C44321 Squamous cell carcinoma of skin of nose: Secondary | ICD-10-CM | POA: Diagnosis not present

## 2023-05-06 LAB — LIPID PANEL
Cholesterol: 198 mg/dL (ref 0–200)
HDL: 62.2 mg/dL (ref >39.00)
NonHDL: 135.36
Total CHOL/HDL Ratio: 3
Triglycerides: 326 mg/dL — ABNORMAL HIGH (ref 0.0–149.0)
VLDL: 65.2 mg/dL — ABNORMAL HIGH (ref 0.0–40.0)

## 2023-05-06 LAB — CBC WITH DIFFERENTIAL/PLATELET
Basophils Absolute: 0.1 10*3/uL (ref 0.0–0.1)
Basophils Relative: 1 % (ref 0.0–3.0)
Eosinophils Absolute: 0.3 10*3/uL (ref 0.0–0.7)
Eosinophils Relative: 3.4 % (ref 0.0–5.0)
HCT: 42.2 % (ref 36.0–46.0)
Hemoglobin: 13.8 g/dL (ref 12.0–15.0)
Lymphocytes Relative: 26.4 % (ref 12.0–46.0)
Lymphs Abs: 2.5 10*3/uL (ref 0.7–4.0)
MCHC: 32.6 g/dL (ref 30.0–36.0)
MCV: 86.4 fl (ref 78.0–100.0)
Monocytes Absolute: 0.5 10*3/uL (ref 0.1–1.0)
Monocytes Relative: 5.7 % (ref 3.0–12.0)
Neutro Abs: 6 10*3/uL (ref 1.4–7.7)
Neutrophils Relative %: 63.5 % (ref 43.0–77.0)
Platelets: 561 10*3/uL — ABNORMAL HIGH (ref 150.0–400.0)
RBC: 4.89 Mil/uL (ref 3.87–5.11)
RDW: 13 % (ref 11.5–15.5)
WBC: 9.5 10*3/uL (ref 4.0–10.5)

## 2023-05-06 LAB — COMPREHENSIVE METABOLIC PANEL
ALT: 10 U/L (ref 0–35)
AST: 15 U/L (ref 0–37)
Albumin: 4.2 g/dL (ref 3.5–5.2)
Alkaline Phosphatase: 70 U/L (ref 39–117)
BUN: 13 mg/dL (ref 6–23)
CO2: 27 mEq/L (ref 19–32)
Calcium: 9.6 mg/dL (ref 8.4–10.5)
Chloride: 102 mEq/L (ref 96–112)
Creatinine, Ser: 1.09 mg/dL (ref 0.40–1.20)
GFR: 50.78 mL/min — ABNORMAL LOW (ref >60.00)
Glucose, Bld: 117 mg/dL — ABNORMAL HIGH (ref 70–99)
Potassium: 4.6 mEq/L (ref 3.5–5.1)
Sodium: 140 mEq/L (ref 135–145)
Total Bilirubin: 0.7 mg/dL (ref 0.2–1.2)
Total Protein: 6.6 g/dL (ref 6.0–8.3)

## 2023-05-06 LAB — LDL CHOLESTEROL, DIRECT: Direct LDL: 102 mg/dL

## 2023-05-06 LAB — TSH: TSH: 0.27 u[IU]/mL — ABNORMAL LOW (ref 0.35–5.50)

## 2023-05-06 LAB — HEMOGLOBIN A1C: Hgb A1c MFr Bld: 5.7 % (ref 4.6–6.5)

## 2023-05-07 DIAGNOSIS — C44321 Squamous cell carcinoma of skin of nose: Secondary | ICD-10-CM | POA: Diagnosis not present

## 2023-05-07 DIAGNOSIS — M950 Acquired deformity of nose: Secondary | ICD-10-CM | POA: Diagnosis not present

## 2023-05-12 ENCOUNTER — Ambulatory Visit (INDEPENDENT_AMBULATORY_CARE_PROVIDER_SITE_OTHER): Payer: Medicare Other | Admitting: Family Medicine

## 2023-05-12 ENCOUNTER — Encounter: Payer: Self-pay | Admitting: Family Medicine

## 2023-05-12 VITALS — BP 116/74 | HR 76 | Temp 98.0°F | Ht 65.5 in | Wt 178.0 lb

## 2023-05-12 DIAGNOSIS — E039 Hypothyroidism, unspecified: Secondary | ICD-10-CM | POA: Diagnosis not present

## 2023-05-12 DIAGNOSIS — N1832 Chronic kidney disease, stage 3b: Secondary | ICD-10-CM

## 2023-05-12 DIAGNOSIS — N289 Disorder of kidney and ureter, unspecified: Secondary | ICD-10-CM

## 2023-05-12 DIAGNOSIS — E781 Pure hyperglyceridemia: Secondary | ICD-10-CM | POA: Diagnosis not present

## 2023-05-12 DIAGNOSIS — D75839 Thrombocytosis, unspecified: Secondary | ICD-10-CM

## 2023-05-12 DIAGNOSIS — Z0000 Encounter for general adult medical examination without abnormal findings: Secondary | ICD-10-CM | POA: Diagnosis not present

## 2023-05-12 DIAGNOSIS — E2839 Other primary ovarian failure: Secondary | ICD-10-CM

## 2023-05-12 DIAGNOSIS — I10 Essential (primary) hypertension: Secondary | ICD-10-CM

## 2023-05-12 DIAGNOSIS — R7303 Prediabetes: Secondary | ICD-10-CM

## 2023-05-12 MED ORDER — LEVOTHYROXINE SODIUM 88 MCG PO TABS: 88.0000 ug | ORAL_TABLET | Freq: Every day | ORAL | 3 refills | Status: DC

## 2023-05-12 NOTE — Progress Notes (Signed)
Subjective:    Patient ID: [name], female    DOB: 10-14-1951, 72 y.o.   MRN: [id number]  HPI Here for health maintenance exam and to review chronic medical problems    Wt Readings from Last 3 Encounters:  05/12/23 178 lb (80.7 kg)  08/22/22 191 lb 3.2 oz (86.7 kg)  05/08/22 185 lb 8 oz (84.1 kg)   29.17 kg/m  Vitals:   05/12/23 0758  BP: 116/74  Pulse: 76  Temp: 98 F (36.7 C)  SpO2: 98%    Immunization History  Administered Date(s) Administered   Fluad Quad(high Dose 65+) 08/26/2022   Influenza Split 08/10/2011   Influenza Whole 10/10/2007, 08/17/2008, 08/31/2009, 08/09/2010   Influenza, High Dose Seasonal PF 07/18/2017, 07/09/2018, 08/06/2019, 08/16/2020, 08/16/2020, 08/17/2020, 07/13/2021   Influenza,inj,Quad PF,6+ Mos 08/04/2015, 09/02/2016   Influenza-Unspecified 08/09/2014, 08/26/2022   PFIZER Comirnaty(Gray Top)Covid-19 Tri-Sucrose Vaccine 03/09/2021, 08/26/2022   PFIZER(Purple Top)SARS-COV-2 Vaccination 01/17/2020, 02/09/2020, 09/07/2020, 09/14/2021   PNEUMOCOCCAL CONJUGATE-20 05/24/2021   Pfizer Covid-19 Vaccine Bivalent Booster 12yrs & up 09/14/2021, 08/26/2022   Pneumococcal Conjugate-13 11/28/2015   Pneumococcal Polysaccharide-23 08/25/2003, 10/10/2007   Pneumococcal-Unspecified 11/27/2016   Respiratory Syncytial Virus Vaccine,Recomb Aduvanted(Arexvy) 10/11/2022   Td 10/21/2006   Tdap 03/20/2015   Unspecified SARS-COV-2 Vaccination 04/24/2022   Zoster Recombinat (Shingrix) 09/04/2018, 12/09/2018   Zoster, Live 03/25/2012   Health Maintenance Due  Topic Date Due   Medicare Annual Wellness (AWV)  04/30/2023    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  Still bandaged    Mammogram 04/2023 Self breast exam- no lumps    Colonoscopy 05/2018  Dexa  05/2018- in the normal range  Falls-none Fractures-none  Supplements ca and D  Exercise - walking   bp is stable today  No cp or palpitations or headaches or edema  No side effects  to medicines  BP Readings from Last 3 Encounters:  05/12/23 116/74  08/22/22 118/70  05/08/22 125/88    Propranolol ER 120 mg daily  Amlodipine 10 mg daily   Pulse Readings from Last 3 Encounters:  05/12/23 76  08/22/22 70  05/08/22 71     H/o renal insuff Last metabolic panel Lab Results  Component Value Date   GLUCOSE 117 (H) 05/06/2023   NA 140 05/06/2023   K 4.6 05/06/2023   CL 102 05/06/2023   CO2 27 05/06/2023   BUN 13 05/06/2023   CREATININE 1.09 05/06/2023   GFRNONAA 54 (L) 10/24/2021   CALCIUM 9.6 05/06/2023   PHOS 3.6 10/30/2018   PROT 6.6 05/06/2023   ALBUMIN 4.2 05/06/2023   BILITOT 0.7 05/06/2023   ALKPHOS 70 05/06/2023   AST 15 05/06/2023   ALT 10 05/06/2023   ANIONGAP 7 10/24/2021   GFR 50.7  Not taking K    Hypothyroidism  Pt has no clinical changes Has felt jittery  Lab Results  Component Value Date   TSH 0.27 (L) 05/06/2023     Levothyroxine 100 mcg daily   Used to be on cytomel   Prediabetes Lab Results  Component Value Date   HGBA1C 5.7 05/06/2023    Lab Results  Component Value Date   WBC 9.5 05/06/2023   HGB 13.8 05/06/2023   HCT 42.2 05/06/2023   MCV 86.4 05/06/2023   PLT 561.0 (H) 05/06/2023   No bleeding or bruising or clotting   Cholesterol Lab Results  Component Value Date   CHOL 198 05/06/2023   CHOL 206 (H) 05/02/2022   CHOL 178 07/24/2021  Lab Results  Component Value Date   HDL 62.20 05/06/2023   HDL 70.10 05/02/2022   HDL 68.70 07/24/2021   Lab Results  Component Value Date   LDLCALC 68 06/01/2020   LDLCALC 56 04/15/2019   LDLCALC 78 04/04/2017   Lab Results  Component Value Date   TRIG 326.0 (H) 05/06/2023   TRIG 270.0 (H) 05/02/2022   TRIG 256.0 (H) 07/24/2021   Lab Results  Component Value Date   CHOLHDL 3 05/06/2023   CHOLHDL 3 05/02/2022   CHOLHDL 3 07/24/2021   Lab Results  Component Value Date   LDLDIRECT 102.0 05/06/2023   LDLDIRECT 104.0 05/02/2022   LDLDIRECT 84.0  07/24/2021   Fenofibrate 54 mg daily  No missed doses  Diet is good/ avoids fats as a rule   Patient Active Problem List   Diagnosis Date Noted   Hemorrhoids 10/23/2018   Renal insufficiency 10/23/2018   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    Estrogen deficiency 04/15/2018   Bladder prolapse, female, 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   Dyspepsia 04/14/2017   Colon cancer screening 04/09/2017   Prediabetes 03/30/2017   Medicare annual wellness visit, initial 04/04/2016   Other screening mammogram 12/31/2011   Post-menopausal 12/31/2011   Routine general medical examination at a health care facility 12/23/2011   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   Hypothyroidism 11/08/2008   HSV (herpes simplex virus) infection 09/23/2007   Hypertriglyceridemia 09/23/2007   Essential hypertension 09/23/2007   ALLERGIC RHINITIS 09/23/2007   ASTHMA 09/23/2007   HYPOKALEMIA 04/16/2007   Thrombocytosis (HCC) 04/09/2007   Past Medical History:  Diagnosis Date   Allergic rhinitis    Allergy    Anemia    b12 def.pt. unaware   Asthma    controlled w/ singulair   Herpes simplex without mention of complication    controlled w/ meds (no current fever blisters)   History of hiatal hernia    History of kidney stones    HLD (hyperlipidemia)    borderline-  no meds   HTN (hypertension)    echo- 2006   Hyperthyroidism hx ptu 2006   takes synthroid; had Graves disease, was treated for that- now thyroid doesn't work   Leukocytosis, unspecified hx of 10 yrs ago   no problem since   Migraine    Mixed incontinence    urge and stress incontinence   Renal cyst right    pt is unsure of this, has had kidney stones   Ureteral calculi right    s/p laser  litho w/ stone extraction 01-28-11   Past Surgical History:  Procedure Laterality Date   BIOPSY  10/16/2018   Procedure: BIOPSY;  Surgeon: Rachael Fee, MD;  Location: [facility] ENDOSCOPY;  Service: Endoscopy;;   BREAST REDUCTION SURGERY  1994   CERVICAL  FUSION  2000   fusion c4-6   CERVICAL FUSION  2005   fusion c6-7 and plate removal Z6-1   COLONOSCOPY     CYSTOSCOPY W/ RETROGRADES  10/21/2011   Procedure: CYSTOSCOPY WITH RETROGRADE PYELOGRAM;  Surgeon: Kathi Ludwig, MD;  Location: [facility] [place];  Service: Urology;  Laterality: Right;  CYSTOSCOPY RIGHT RETROGRADE, PYELOGRAM  URETEROSCOPY WITH HOLMIUM LASER AND STONE EXTRACTION   CYSTOSCOPY/RETROGRADE/URETEROSCOPY/STONE EXTRACTION WITH BASKET  01/28/2011   right    CYSTOSCOPY/URETEROSCOPY/HOLMIUM LASER/STENT PLACEMENT Right 11/16/2021   Procedure: CYSTOSCOPY/RETROGRADE/URETEROSCOPY/HOLMIUM LASER/STENT PLACEMENT;  Surgeon: Jannifer Hick, MD;  Location: [facility] ORS;  Service: Urology;  Laterality: Right;   ESOPHAGOGASTRODUODENOSCOPY (EGD) WITH PROPOFOL N/A 10/16/2018  Procedure: ESOPHAGOGASTRODUODENOSCOPY (EGD) WITH PROPOFOL;  Surgeon: Rachael Fee, MD;  Location[facility];  Service: Endoscopy;  Laterality: N/A;   MOHS SURGERY  04/2023   REDUCTION MAMMAPLASTY Bilateral 1995   TONSILLECTOMY  1960   VAGINAL HYSTERECTOMY  1987   fibroids/anemia   Social History   Tobacco Use   Smoking status: Never   Smokeless tobacco: Never  Vaping Use   Vaping Use: Never used  Substance Use Topics   Alcohol use: No    Alcohol/week: 0.0 standard drinks of alcohol   Drug use: No   Family History  Problem Relation Age of Onset   Osteoporosis Mother    Diabetes Mother    Heart disease Father    Heart failure Father    Diabetes Other        Grandfather   Coronary artery disease Other        Grandmother   Breast cancer Neg Hx    Colon cancer Neg Hx    Esophageal cancer Neg Hx    Stomach cancer Neg Hx    Rectal cancer Neg Hx    Thyroid disease Neg Hx    Allergies  Allergen Reactions   Contrast Media [Iodinated Contrast Media] Anaphylaxis   Shellfish-Derived Products Anaphylaxis   Septra [Sulfamethoxazole-Trimethoprim] Nausea Only   Current Outpatient Medications  on File Prior to Visit  Medication Sig Dispense Refill   acetaminophen (TYLENOL) 500 MG tablet Take 1,000 mg by mouth every 6 (six) hours as needed for moderate pain.     amLODipine (NORVASC) 10 MG tablet TAKE 1 TABLET BY MOUTH EVERY DAY 90 tablet 2   Calcium Carb-Cholecalciferol (CALCIUM 500/D PO) Take 1 tablet by mouth daily.     Cyanocobalamin (B-12 PO) Take 1 capsule by mouth daily.     fenofibrate 54 MG tablet TAKE 1 TABLET BY MOUTH DAILY 90 tablet 3   mirabegron ER (MYRBETRIQ) 50 MG TB24 tablet TAKE 1 TABLET BY MOUTH DAILY 90 tablet 0   montelukast (SINGULAIR) 10 MG tablet TAKE 1 TABLET BY MOUTH DAILY 90 tablet 0   Multiple Vitamin (MULTIVITAMIN) tablet Take 1 tablet by mouth daily.     propranolol ER (INDERAL LA) 120 MG 24 hr capsule TAKE 1 CAPSULE BY MOUTH DAILY 90 capsule 0   SUMAtriptan (IMITREX) 100 MG tablet TAKE 1 TABLET FOR HEADACHE MAY REPEAT ONCE IN 2 HOURS IF NEEDED. MAXIMUM OF 2    TABLETS IN ONE DAY. (Patient taking differently: Take 100 mg by mouth as needed for migraine or headache. TAKE 100 mg TABLET FOR HEADACHE MAY REPEAT ONCE IN 2 HOURS IF NEEDED. MAXIMUM OF 200 mg    TABLETS IN ONE DAY.) 27 tablet 3   No current facility-administered medications on file prior to visit.     Review of Systems  Constitutional:  Negative for activity change, appetite change, fatigue, fever and unexpected weight change.  HENT:  Negative for congestion, ear pain, rhinorrhea, sinus pressure and sore throat.   Eyes:  Negative for pain, redness and visual disturbance.  Respiratory:  Negative for cough, shortness of breath and wheezing.   Cardiovascular:  Negative for chest pain and palpitations.  Gastrointestinal:  Negative for abdominal pain, blood in stool, constipation and diarrhea.  Endocrine: Negative for polydipsia and polyuria.  Genitourinary:  Negative for dysuria, frequency and urgency.  Musculoskeletal:  Negative for arthralgias, back pain and myalgias.  Skin:  Negative for pallor  and rash.  Allergic/Immunologic: Negative for environmental allergies.  Neurological:  Negative for dizziness,  syncope and headaches.  Hematological:  Negative for adenopathy. Does not bruise/bleed easily.  Psychiatric/Behavioral:  Negative for decreased concentration and dysphoric mood. The patient is not nervous/anxious.        More jittery/shaky       Objective:   Physical Exam Constitutional:      General: She is not in acute distress.    Appearance: Normal appearance. She is well-developed. She is obese. She is not ill-appearing or diaphoretic.  HENT:     Head: Normocephalic and atraumatic.     Right Ear: Tympanic membrane, ear canal and external ear normal.     Left Ear: Tympanic membrane, ear canal and external ear normal.     Nose: Nose normal. No congestion.     Mouth/Throat:     Mouth: Mucous membranes are moist.     Pharynx: Oropharynx is clear. No posterior oropharyngeal erythema.  Eyes:     General: No scleral icterus.    Extraocular Movements: Extraocular movements intact.     Conjunctiva/sclera: Conjunctivae normal.     Pupils: Pupils are equal, round, and reactive to light.  Neck:     Thyroid: No thyromegaly.     Vascular: No carotid bruit or JVD.  Cardiovascular:     Rate and Rhythm: Normal rate and regular rhythm.     Pulses: Normal pulses.     Heart sounds: Normal heart sounds.     No gallop.  Pulmonary:     Effort: Pulmonary effort is normal. No respiratory distress.     Breath sounds: Normal breath sounds. No wheezing.     Comments: Good air exch Chest:     Chest wall: No tenderness.  Abdominal:     General: Bowel sounds are normal. There is no distension or abdominal bruit.     Palpations: Abdomen is soft. There is no mass.     Tenderness: There is no abdominal tenderness.     Hernia: No hernia is present.  Genitourinary:    Comments: Breast exam: No mass, nodules, thickening, tenderness, bulging, retraction, inflamation, nipple discharge or skin  changes noted.  No axillary or clavicular LA.     Musculoskeletal:        General: No tenderness. Normal range of motion.     Cervical back: Normal range of motion and neck supple. No rigidity. No muscular tenderness.     Right lower leg: No edema.     Left lower leg: No edema.     Comments: No kyphosis   Lymphadenopathy:     Cervical: No cervical adenopathy.  Skin:    General: Skin is warm and dry.     Coloration: Skin is not pale.     Findings: No erythema or rash.     Comments: Solar lentigines diffusely   Neurological:     Mental Status: She is alert. Mental status is at baseline.     Cranial Nerves: No cranial nerve deficit.     Motor: No abnormal muscle tone.     Coordination: Coordination normal.     Gait: Gait normal.     Deep Tendon Reflexes: Reflexes are normal and symmetric. Reflexes normal.  Psychiatric:        Mood and Affect: Mood normal.        Cognition and Memory: Cognition and memory normal.           Assessment & Plan:   Problem List Items Addressed This Visit       Cardiovascular and Mediastinum   Essential  hypertension    bp in fair control at this time  BP Readings from Last 1 Encounters:  05/12/23 116/74  No changes needed Most recent labs reviewed  Disc lifstyle change with low sodium diet and exercise  Plan to continue Propranolol ER 120 mg daily  Amlodipine 10 mg daily          Endocrine   Hypothyroidism    Lab Results  Component Value Date   TSH 0.27 (L) 05/06/2023  Some jitteriness Changing levothyroxine from 100 to 88 Re check in 4-6 weeks      Relevant Medications   levothyroxine (SYNTHROID) 88 MCG tablet     Genitourinary   Renal insufficiency    Seeing nephrology GFR of 50.7  No longer needs K         Hematopoietic and Hemostatic   Thrombocytosis (HCC)    Platelet ct is 561- up  No symptoms or changes in clotting Re check 4-6 wk Consider heme ref        Other   Routine general medical examination at a  health care facility - Primary    Reviewed health habits including diet and exercise and skin cancer prevention Reviewed appropriate screening tests for age  Also reviewed health mt list, fam hx and immunization status , as well as social and family history   See HPI Labs reviewed and ordered Mammogram utd 04/2023 Colonoscopy 05/2018 Dexa 5 y ago/normal- ordered another  No falls or fracture  PHQ 0 Encouraged to add more strength building exercise      Prediabetes    Lab Results  Component Value Date   HGBA1C 5.7 05/06/2023  Stable disc imp of low glycemic diet and wt loss to prevent DM2       Hypertriglyceridemia    Trig up to 326 Disc goals for lipids and reasons to control them Rev last labs with pt Rev low sat fat diet in detail Diet is fair  Re check 4-6 weeks/ low fat diet If not improved will consider increase fenofibrate      Estrogen deficiency    Dexa ordered      Relevant Orders   DG Bone Density

## 2023-05-12 NOTE — Assessment & Plan Note (Signed)
Dexa ordered

## 2023-05-12 NOTE — Assessment & Plan Note (Signed)
bp in fair control at this time  BP Readings from Last 1 Encounters:  05/12/23 116/74   No changes needed Most recent labs reviewed  Disc lifstyle change with low sodium diet and exercise  Plan to continue Propranolol ER 120 mg daily  Amlodipine 10 mg daily

## 2023-05-12 NOTE — Assessment & Plan Note (Signed)
Sees nephrology  GFR of 50.7- improved  Encouraged good fluid intake

## 2023-05-12 NOTE — Assessment & Plan Note (Signed)
Reviewed health habits including diet and exercise and skin cancer prevention Reviewed appropriate screening tests for age  Also reviewed health mt list, fam hx and immunization status , as well as social and family history   See HPI Labs reviewed and ordered Mammogram utd 04/2023 Colonoscopy 05/2018 Dexa 5 y ago/normal- ordered another  No falls or fracture  PHQ 0 Encouraged to add more strength building exercise

## 2023-05-12 NOTE — Assessment & Plan Note (Signed)
Trig up to 326 Disc goals for lipids and reasons to control them Rev last labs with pt Rev low sat fat diet in detail Diet is fair  Re check 4-6 weeks/ low fat diet If not improved will consider increase fenofibrate

## 2023-05-12 NOTE — Assessment & Plan Note (Signed)
Lab Results  Component Value Date   TSH 0.27 (L) 05/06/2023   Some jitteriness Changing levothyroxine from 100 to 88 Re check in 4-6 weeks

## 2023-05-12 NOTE — Patient Instructions (Addendum)
Go down on levothyroxine to 88 mcg daily  We will re check TSH in 4-6 weeks   Keep walking  Add some strength training to your routine, this is important for bone and brain health and can reduce your risk of falls and help your body use insulin properly and regulate weight  Light weights, exercise bands , and internet videos are a good way to start  Yoga (chair or regular), machines , floor exercises or a gym with machines are also good options    Call and schedule your bone density test You have an order for:  []   2D Mammogram  []   3D Mammogram  [x]   Bone Density     Please call for appointment:   []   [facility]  [address] [place] [address]  [phone]  []   [facility])   [address]  [place], [place] [address]  [phone]  [x]   [facility]      [address] [place], [place]        [phone]         []   [facility]  [address] [place], [place]  [phone]  []  [facility]   [address]   [place], [place] [address]  [phone]  []  [facility]  [address] [place], [place] [address] [phone]    Make sure to wear two piece clothing  No lotions powders or deodorants the day of the appointment Make sure to bring picture ID and insurance card.  Bring list of medications you are currently taking including any supplements.   Schedule your screening mammogram through MyChart!   Select [place] imaging sites can now be scheduled through MyChart.  Log into your MyChart account.  Go to 'Visit' (or 'Appointments' if  on mobile App) --> Schedule an  Appointment  Under 'Select a Reason for Visit' choose the Mammogram  Screening option.  Complete the pre-visit questions  and select  the time and place that  best fits your schedule

## 2023-05-12 NOTE — Assessment & Plan Note (Signed)
Seeing nephrology GFR of 50.7  No longer needs K

## 2023-05-12 NOTE — Assessment & Plan Note (Signed)
Platelet ct is 561- up  No symptoms or changes in clotting Re check 4-6 wk Consider heme ref

## 2023-05-12 NOTE — Assessment & Plan Note (Signed)
Lab Results  Component Value Date   HGBA1C 5.7 05/06/2023   Stable disc imp of low glycemic diet and wt loss to prevent DM2

## 2023-05-13 DIAGNOSIS — C44321 Squamous cell carcinoma of skin of nose: Secondary | ICD-10-CM | POA: Diagnosis not present

## 2023-05-13 DIAGNOSIS — M950 Acquired deformity of nose: Secondary | ICD-10-CM | POA: Diagnosis not present

## 2023-05-27 DIAGNOSIS — M950 Acquired deformity of nose: Secondary | ICD-10-CM | POA: Diagnosis not present

## 2023-05-27 DIAGNOSIS — C44321 Squamous cell carcinoma of skin of nose: Secondary | ICD-10-CM | POA: Diagnosis not present

## 2023-05-28 ENCOUNTER — Ambulatory Visit (INDEPENDENT_AMBULATORY_CARE_PROVIDER_SITE_OTHER): Payer: Medicare Other

## 2023-05-28 VITALS — Ht 65.5 in | Wt 181.0 lb

## 2023-05-28 DIAGNOSIS — Z0000 Encounter for general adult medical examination without abnormal findings: Secondary | ICD-10-CM

## 2023-05-28 NOTE — Progress Notes (Signed)
Subjective:   [name] is a 72 y.o. female who presents for Medicare Annual (Subsequent) preventive examination.  Visit Complete: Virtual  I connected with  [name] on 05/28/23 by a audio enabled telemedicine application and verified that I am speaking with the correct person using two identifiers.  Patient Location: Home  Provider Location: Office/Clinic  I discussed the limitations of evaluation and management by telemedicine. The patient expressed understanding and agreed to proceed.   Review of Systems      Cardiac Risk Factors include: advanced age (>46men, >96 women);hypertension     Objective:    Today's Vitals   05/28/23 1129  Weight: 181 lb (82.1 kg)  Height: 5' 5.5" (1.664 m)   Body mass index is 29.66 kg/m.     05/28/2023   11:33 AM 04/29/2022    8:25 AM 11/16/2021   10:20 AM 10/24/2021   10:11 AM 04/27/2021    9:51 AM 04/26/2020    9:46 AM 04/14/2019   10:41 AM  Advanced Directives  Does Patient Have a Medical Advance Directive? Yes Yes Yes Yes Yes Yes Yes  Type of Estate agent of [place];Living will Healthcare Power of [place];Living will Healthcare Power of [place];Living will Healthcare Power of [place];Living will Healthcare Power of [place];Living will Healthcare Power of [place];Living will Healthcare Power of [place];Living will  Does patient want to make changes to medical advance directive?   No - Patient declined No - Patient declined     Copy of Healthcare Power of Attorney in Chart? No - copy requested No - copy requested No - copy requested Yes - validated most recent copy scanned in chart (See row information) No - copy requested Yes - validated most recent copy scanned in chart (See row information) Yes - validated most recent copy scanned in chart (See row information)    Current Medications (verified) Outpatient Encounter Medications as of 05/28/2023  Medication Sig   acetaminophen (TYLENOL) 500 MG  tablet Take 1,000 mg by mouth every 6 (six) hours as needed for moderate pain.   amLODipine (NORVASC) 10 MG tablet TAKE 1 TABLET BY MOUTH EVERY DAY   Calcium Carb-Cholecalciferol (CALCIUM 500/D PO) Take 1 tablet by mouth daily.   Cyanocobalamin (B-12 PO) Take 1 capsule by mouth daily.   fenofibrate 54 MG tablet TAKE 1 TABLET BY MOUTH DAILY   levothyroxine (SYNTHROID) 88 MCG tablet Take 1 tablet (88 mcg total) by mouth daily.   mirabegron ER (MYRBETRIQ) 50 MG TB24 tablet TAKE 1 TABLET BY MOUTH DAILY   montelukast (SINGULAIR) 10 MG tablet TAKE 1 TABLET BY MOUTH DAILY   Multiple Vitamin (MULTIVITAMIN) tablet Take 1 tablet by mouth daily.   propranolol ER (INDERAL LA) 120 MG 24 hr capsule TAKE 1 CAPSULE BY MOUTH DAILY   SUMAtriptan (IMITREX) 100 MG tablet TAKE 1 TABLET FOR HEADACHE MAY REPEAT ONCE IN 2 HOURS IF NEEDED. MAXIMUM OF 2    TABLETS IN ONE DAY. (Patient taking differently: Take 100 mg by mouth as needed for migraine or headache. TAKE 100 mg TABLET FOR HEADACHE MAY REPEAT ONCE IN 2 HOURS IF NEEDED. MAXIMUM OF 200 mg    TABLETS IN ONE DAY.)   No facility-administered encounter medications on file as of 05/28/2023.    Allergies (verified) Contrast media [iodinated contrast media], Shellfish-derived products, and Septra [sulfamethoxazole-trimethoprim]   History: Past Medical History:  Diagnosis Date   Allergic rhinitis    Allergy    Anemia    b12 def.pt. unaware  Asthma    controlled w/ singulair   Herpes simplex without mention of complication    controlled w/ meds (no current fever blisters)   History of hiatal hernia    History of kidney stones    HLD (hyperlipidemia)    borderline-  no meds   HTN (hypertension)    echo- 2006   Hyperthyroidism hx ptu 2006   takes synthroid; had Graves disease, was treated for that- now thyroid doesn't work   Leukocytosis, unspecified hx of 10 yrs ago   no problem since   Migraine    Mixed incontinence    urge and stress incontinence    Renal cyst right    pt is unsure of this, has had kidney stones   Ureteral calculi right    s/p laser  litho w/ stone extraction 01-28-11   Past Surgical History:  Procedure Laterality Date   BIOPSY  10/16/2018   Procedure: BIOPSY;  Surgeon: Rachael Fee, MD;  Location: [facility] ENDOSCOPY;  Service: Endoscopy;;   BREAST REDUCTION SURGERY  1994   CERVICAL FUSION  2000   fusion c4-6   CERVICAL FUSION  2005   fusion c6-7 and plate removal N8-2   COLONOSCOPY     CYSTOSCOPY W/ RETROGRADES  10/21/2011   Procedure: CYSTOSCOPY WITH RETROGRADE PYELOGRAM;  Surgeon: Kathi Ludwig, MD;  Location: [facility] [place];  Service: Urology;  Laterality: Right;  CYSTOSCOPY RIGHT RETROGRADE, PYELOGRAM  URETEROSCOPY WITH HOLMIUM LASER AND STONE EXTRACTION   CYSTOSCOPY/RETROGRADE/URETEROSCOPY/STONE EXTRACTION WITH BASKET  01/28/2011   right    CYSTOSCOPY/URETEROSCOPY/HOLMIUM LASER/STENT PLACEMENT Right 11/16/2021   Procedure: CYSTOSCOPY/RETROGRADE/URETEROSCOPY/HOLMIUM LASER/STENT PLACEMENT;  Surgeon: Jannifer Hick, MD;  Location: [facility] ORS;  Service: Urology;  Laterality: Right;   ESOPHAGOGASTRODUODENOSCOPY (EGD) WITH PROPOFOL N/A 10/16/2018   Procedure: ESOPHAGOGASTRODUODENOSCOPY (EGD) WITH PROPOFOL;  Surgeon: Rachael Fee, MD;  Location: [facility] ENDOSCOPY;  Service: Endoscopy;  Laterality: N/A;   MOHS SURGERY  04/2023   REDUCTION MAMMAPLASTY Bilateral 1995   TONSILLECTOMY  1960   VAGINAL HYSTERECTOMY  1987   fibroids/anemia   Family History  Problem Relation Age of Onset   Osteoporosis Mother    Diabetes Mother    Heart disease Father    Heart failure Father    Diabetes Other        Grandfather   Coronary artery disease Other        Grandmother   Breast cancer Neg Hx    Colon cancer Neg Hx    Esophageal cancer Neg Hx    Stomach cancer Neg Hx    Rectal cancer Neg Hx    Thyroid disease Neg Hx    Social History   Socioeconomic History   Marital status: Married    Spouse name:  Riley Lam   Number of children: 1   Years of education: Not on file   Highest education level: Not on file  Occupational History   Occupation: Press photographer: [employer]  Tobacco Use   Smoking status: Never   Smokeless tobacco: Never  Vaping Use   Vaping Use: Never used  Substance and Sexual Activity   Alcohol use: No    Alcohol/week: 0.0 standard drinks of alcohol   Drug use: No   Sexual activity: Yes  Other Topics Concern   Not on file  Social History Narrative   Married  51 yrs 2023.   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   Social Determinants of Health  Financial Resource Strain: Low Risk  (05/28/2023)   Overall Financial Resource Strain (CARDIA)    Difficulty of Paying Living Expenses: Not hard at all  Food Insecurity: No Food Insecurity (05/28/2023)   Hunger Vital Sign    Worried About Running Out of Food in the Last Year: Never true    Ran Out of Food in the Last Year: Never true  Transportation Needs: No Transportation Needs (05/28/2023)   PRAPARE - Administrator, Civil Service (Medical): No    Lack of Transportation (Non-Medical): No  Physical Activity: Insufficiently Active (05/28/2023)   Exercise Vital Sign    Days of Exercise per Week: 3 days    Minutes of Exercise per Session: 30 min  Stress: No Stress Concern Present (05/28/2023)   [employer] of Occupational Health - Occupational Stress Questionnaire    Feeling of Stress : Not at all  Social Connections: Moderately Integrated (05/28/2023)   Social Connection and Isolation Panel [NHANES]    Frequency of Communication with Friends and Family: More than three times a week    Frequency of Social Gatherings with Friends and Family: More than three times a week    Attends Religious Services: More than 4 times per year    Active Member of [employer] or Organizations: No    Attends Engineer, structural: Never    Marital Status: Married    Tobacco  Counseling Counseling given: Not Answered   Clinical Intake:  Pre-visit preparation completed: Yes  Pain : No/denies pain     Nutritional Risks: None Diabetes: No  How often do you need to have someone help you when you read instructions, pamphlets, or other written materials from your doctor or pharmacy?: 1 - Never  Interpreter Needed?: No  Information entered by :: C.Otho Michalik LPN   Activities of Daily Living    05/28/2023   11:34 AM 05/27/2023    4:05 PM  In your present state of health, do you have any difficulty performing the following activities:  Hearing? 0 0  Vision? 0 0  Difficulty concentrating or making decisions? 0 0  Walking or climbing stairs? 0 0  Dressing or bathing? 0 0  Doing errands, shopping? 0 0  Preparing Food and eating ? N N  Using the Toilet? N N  In the past six months, have you accidently leaked urine? N N  Do you have problems with loss of bowel control? N N  Managing your Medications? N N  Managing your Finances? N N  Housekeeping or managing your Housekeeping? N N    Patient Care Team: [place], Audrie Gallus, MD as PCP - General Adams, Isabella Bowens, [facility] (Inactive) as Pharmacist (Pharmacist)  Indicate any recent Medical Services you may have received from other than [facility] providers in the past year (date may be approximate).     Assessment:   This is a routine wellness examination for Raneshia.  Hearing/Vision screen Hearing Screening - Comments:: No hearing issues Vision Screening - Comments:: Readers- Dr.Scott   Dietary issues and exercise activities discussed:     Goals Addressed             This Visit's Progress    Patient Stated       No new goals       Depression Screen    05/28/2023   11:33 AM 05/12/2023    7:58 AM 04/29/2022    8:22 AM 04/27/2021    9:52 AM 04/26/2020    9:47 AM 04/14/2019  10:51 AM 04/08/2018    9:59 AM  PHQ 2/9 Scores  PHQ - 2 Score 0 0 0 0 0 0 0  PHQ- 9 Score    0 0 0 0    Fall Risk    05/28/2023    11:34 AM 05/27/2023    4:05 PM 05/12/2023    7:57 AM 04/29/2022    8:25 AM 04/27/2021    9:51 AM  Fall Risk   Falls in the past year? 0 0 0 0 0  Number falls in past yr: 0 0 0 0 0  Injury with Fall? 0 0 0 0 0  Risk for fall due to : No Fall Risks  No Fall Risks No Fall Risks Medication side effect  Follow up Falls prevention discussed;Falls evaluation completed  Falls evaluation completed Falls prevention discussed Falls evaluation completed;Falls prevention discussed    MEDICARE RISK AT HOME:  Medicare Risk at Home - 05/28/23 1136     Any stairs in or around the home? Yes    If so, are there any without handrails? No    Home free of loose throw rugs in walkways, pet beds, electrical cords, etc? Yes    Adequate lighting in your home to reduce risk of falls? Yes    Life alert? No    Use of a cane, walker or w/c? No    Grab bars in the bathroom? Yes    Shower chair or bench in shower? Yes    Elevated toilet seat or a handicapped toilet? Yes               Cognitive Function:    04/27/2021   10:00 AM 04/26/2020    9:50 AM 04/14/2019   10:51 AM 04/08/2018   10:00 AM 04/04/2017    9:32 AM  MMSE - Mini Mental State Exam  Orientation to time 5 5 5 5 5   Orientation to Place 5 5 5 5 5   Registration 3 3 3 3 3   Attention/ Calculation 5 5 0 0 0  Recall 3 3 3 3 3   Language- name 2 objects   0 0 0  Language- repeat 1 1 1 1 1   Language- follow 3 step command   0 3 3  Language- read & follow direction   0 0 0  Write a sentence   0 0 0  Copy design   0 0 0  Total score   17 20 20         05/28/2023   11:35 AM 04/29/2022    8:26 AM  6CIT Screen  What Year? 0 points 0 points  What month? 0 points 0 points  What time? 0 points 0 points  Count back from 20 0 points 0 points  Months in reverse 0 points 0 points  Repeat phrase 0 points 0 points  Total Score 0 points 0 points    Immunizations Immunization History  Administered Date(s) Administered   Fluad Quad(high Dose 65+)  08/26/2022   Influenza Split 08/10/2011   Influenza Whole 10/10/2007, 08/17/2008, 08/31/2009, 08/09/2010   Influenza, High Dose Seasonal PF 07/18/2017, 07/09/2018, 08/06/2019, 08/16/2020, 08/16/2020, 08/17/2020, 07/13/2021   Influenza,inj,Quad PF,6+ Mos 08/04/2015, 09/02/2016   Influenza-Unspecified 08/09/2014, 08/26/2022   PFIZER Comirnaty(Gray Top)Covid-19 Tri-Sucrose Vaccine 03/09/2021, 08/26/2022   PFIZER(Purple Top)SARS-COV-2 Vaccination 01/17/2020, 02/09/2020, 09/07/2020, 09/14/2021   PNEUMOCOCCAL CONJUGATE-20 05/24/2021   Pfizer Covid-19 Vaccine Bivalent Booster 26yrs & up 09/14/2021, 08/26/2022   Pneumococcal Conjugate-13 11/28/2015   Pneumococcal Polysaccharide-23 08/25/2003, 10/10/2007  Pneumococcal-Unspecified 11/27/2016   Respiratory Syncytial Virus Vaccine,Recomb Aduvanted(Arexvy) 10/11/2022   Td 10/21/2006   Tdap 03/20/2015   Unspecified SARS-COV-2 Vaccination 04/24/2022   Zoster Recombinat (Shingrix) 09/04/2018, 12/09/2018   Zoster, Live 03/25/2012    TDAP status: Up to date  Flu Vaccine status: Up to date  Pneumococcal vaccine status: Up to date  Covid-19 vaccine status: Completed vaccines  Qualifies for Shingles Vaccine? Yes   Zostavax completed  unknown   Shingrix Completed?: Yes  Screening Tests Health Maintenance  Topic Date Due   COVID-19 Vaccine (8 - 2023-24 season) 05/28/2023 (Originally 10/21/2022)   INFLUENZA VACCINE  07/10/2023   MAMMOGRAM  04/14/2024   Medicare Annual Wellness (AWV)  05/27/2024   DTaP/Tdap/Td (3 - Td or Tdap) 03/19/2025   Colonoscopy  05/19/2028   Pneumonia Vaccine 99+ Years old  Completed   DEXA SCAN  Completed   Hepatitis C Screening  Completed   Zoster Vaccines- Shingrix  Completed   HPV VACCINES  Aged Out    Health Maintenance  There are no preventive care reminders to display for this patient.   Colorectal cancer screening: Type of screening: Colonoscopy. Completed 05/19/18. Repeat every 10 years  Mammogram  status: Completed 04/15/23. Repeat every year  Bone Density status: Completed 05/25/18. Results reflect: Bone density results: NORMAL. Repeat every 5 years. Order placed 05/12/2023  Lung Cancer Screening: (Low Dose CT Chest recommended if Age 57-80 years, 20 pack-year currently smoking OR have quit w/in 15years.) does not qualify.   Lung Cancer Screening Referral: no  Additional Screening:  Hepatitis C Screening: does qualify; Completed 04/06/2007  Vision Screening: Recommended annual ophthalmology exams for early detection of glaucoma and other disorders of the eye. Is the patient up to date with their annual eye exam?  Yes  Who is the provider or what is the name of the office in which the patient attends annual eye exams? Dr.Scott If pt is not established with a provider, would they like to be referred to a provider to establish care? Yes .   Dental Screening: Recommended annual dental exams for proper oral hygiene    Community Resource Referral / Chronic Care Management: CRR required this visit?  No   CCM required this visit?  No     Plan:     I have personally reviewed and noted the following in the patient's chart:   Medical and social history Use of alcohol, tobacco or illicit drugs  Current medications and supplements including opioid prescriptions. Patient is not currently taking opioid prescriptions. Functional ability and status Nutritional status Physical activity Advanced directives List of other physicians Hospitalizations, surgeries, and ER visits in previous 12 months Vitals Screenings to include cognitive, depression, and falls Referrals and appointments  In addition, I have reviewed and discussed with patient certain preventive protocols, quality metrics, and best practice recommendations. A written personalized care plan for preventive services as well as general preventive health recommendations were provided to patient.     Maryan Puls,  LPN   0/09/2724   After Visit Summary: (Declined) Due to this being a telephonic visit, with patients personalized plan was offered to patient but patient Declined AVS at this time   Nurse Notes: none

## 2023-05-28 NOTE — Patient Instructions (Addendum)
[name] , Thank you for taking time to come for your Medicare Wellness Visit. I appreciate your ongoing commitment to your health goals. Please review the following plan we discussed and let me know if I can assist you in the future.   These are the goals we discussed:  Goals      Patient Stated     Starting 04/14/2019, I will continue to take medications as prescribed.      Patient Stated     04/26/2020, I will continue to walk 3 days a week for 45 minutes.      Patient Stated     04/27/2021, I will continue to walk daily and work in my garden.     Patient Stated     No new goals     Pharmacy Care Plan     CARE PLAN ENTRY  Current Barriers:  Chronic Disease Management support, education, and care coordination needs related to Hypertension and Chronic Kidney Disease   Hypertension Pharmacist Clinical Goal(s): Over the next 6 months, patient will work with PharmD and providers to maintain BP goal <130/80 mmHg Current regimen:  Propranolol ER 120 mg - 1 capsule daily Interventions: Recommend more frequent home monitoring to ensure BP < 130/80 to prevent decline in kidney function Patient self care activities - Over the next 6 months, patient will: Check BP at least once monthly, document, and provide at future appointments Ensure daily salt intake < 2000 mg/day to reduce BP   CKD Pharmacist Clinical Goal(s): Over the next 6 months, patient will work with PharmD and providers to slow decline of kidney function Current regimen:  No pharmacotherapy Interventions: Recommend more frequent home monitoring to ensure BP < 130/80 to prevent decline in kidney function Recommend discussing Farxiga with Dr. Milinda Antis at next follow up visit Patient self care activities - Over the next 6 months, patient will: Check BP at least once monthly, document, and provide at future appointments Ensure daily salt intake < 2000 mg/day  Increase exercise with goal of 30 minutes, 5 days per  week Incorporate a healthy diet high in vegetables, fruits and whole grains with low-fat dairy products, chicken, fish, legumes, non-tropical vegetable oils and nuts. Limit intake of sweets, sugar-sweetened beverages and red meats.  Please see past updates related to this goal by clicking on the "Past Updates" button in the selected goal         This is a list of the screening recommended for you and due dates:  Health Maintenance  Topic Date Due   COVID-19 Vaccine (8 - 2023-24 season) 05/28/2023*   Flu Shot  07/10/2023   Mammogram  04/14/2024   Medicare Annual Wellness Visit  05/27/2024   DTaP/Tdap/Td vaccine (3 - Td or Tdap) 03/19/2025   Colon Cancer Screening  05/19/2028   Pneumonia Vaccine  Completed   DEXA scan (bone density measurement)  Completed   Hepatitis C Screening  Completed   Zoster (Shingles) Vaccine  Completed   HPV Vaccine  Aged Out  *Topic was postponed. The date shown is not the original due date.    Advanced directives: Please bring a copy of your health care power of attorney and living will to the office to be added to your chart at your convenience.   Conditions/risks identified: Aim for 30 minutes of exercise or brisk walking, 6-8 glasses of water, and 5 servings of fruits and vegetables each day.   Next appointment: Follow up in one year for your annual wellness visit  05/31/24 @ 9:45 telephone   Preventive Care 65 Years and Older, Female Preventive care refers to lifestyle choices and visits with your health care provider that can promote health and wellness. What does preventive care include? A yearly physical exam. This is also called an annual well check. Dental exams once or twice a year. Routine eye exams. Ask your health care provider how often you should have your eyes checked. Personal lifestyle choices, including: Daily care of your teeth and gums. Regular physical activity. Eating a healthy diet. Avoiding tobacco and drug use. Limiting  alcohol use. Practicing safe sex. Taking low-dose aspirin every day. Taking vitamin and mineral supplements as recommended by your health care provider. What happens during an annual well check? The services and screenings done by your health care provider during your annual well check will depend on your age, overall health, lifestyle risk factors, and family history of disease. Counseling  Your health care provider may ask you questions about your: Alcohol use. Tobacco use. Drug use. Emotional well-being. Home and relationship well-being. Sexual activity. Eating habits. History of falls. Memory and ability to understand (cognition). Work and work Astronomer. Reproductive health. Screening  You may have the following tests or measurements: Height, weight, and BMI. Blood pressure. Lipid and cholesterol levels. These may be checked every 5 years, or more frequently if you are over 50 years old. Skin check. Lung cancer screening. You may have this screening every year starting at age 66 if you have a 30-pack-year history of smoking and currently smoke or have quit within the past 15 years. Fecal occult blood test (FOBT) of the stool. You may have this test every year starting at age 69. Flexible sigmoidoscopy or colonoscopy. You may have a sigmoidoscopy every 5 years or a colonoscopy every 10 years starting at age 49. Hepatitis C blood test. Hepatitis B blood test. Sexually transmitted disease (STD) testing. Diabetes screening. This is done by checking your blood sugar (glucose) after you have not eaten for a while (fasting). You may have this done every 1-3 years. Bone density scan. This is done to screen for osteoporosis. You may have this done starting at age 58. Mammogram. This may be done every 1-2 years. Talk to your health care provider about how often you should have regular mammograms. Talk with your health care provider about your test results, treatment options, and if  necessary, the need for more tests. Vaccines  Your health care provider may recommend certain vaccines, such as: Influenza vaccine. This is recommended every year. Tetanus, diphtheria, and acellular pertussis (Tdap, Td) vaccine. You may need a Td booster every 10 years. Zoster vaccine. You may need this after age 51. Pneumococcal 13-valent conjugate (PCV13) vaccine. One dose is recommended after age 62. Pneumococcal polysaccharide (PPSV23) vaccine. One dose is recommended after age 38. Talk to your health care provider about which screenings and vaccines you need and how often you need them. This information is not intended to replace advice given to you by your health care provider. Make sure you discuss any questions you have with your health care provider. Document Released: 12/22/2015 Document Revised: 08/14/2016 Document Reviewed: 09/26/2015 Elsevier Interactive Patient Education  2017 [employer].  Fall Prevention in the Home Falls can cause injuries. They can happen to people of all ages. There are many things you can do to make your home safe and to help prevent falls. What can I do on the outside of my home? Regularly fix the edges of walkways and  driveways and fix any cracks. Remove anything that might make you trip as you walk through a door, such as a raised step or threshold. Trim any bushes or trees on the path to your home. Use bright outdoor lighting. Clear any walking paths of anything that might make someone trip, such as rocks or tools. Regularly check to see if handrails are loose or broken. Make sure that both sides of any steps have handrails. Any raised decks and porches should have guardrails on the edges. Have any leaves, snow, or ice cleared regularly. Use sand or salt on walking paths during winter. Clean up any spills in your garage right away. This includes oil or grease spills. What can I do in the bathroom? Use night lights. Install grab bars by the toilet  and in the tub and shower. Do not use towel bars as grab bars. Use non-skid mats or decals in the tub or shower. If you need to sit down in the shower, use a plastic, non-slip stool. Keep the floor dry. Clean up any water that spills on the floor as soon as it happens. Remove soap buildup in the tub or shower regularly. Attach bath mats securely with double-sided non-slip rug tape. Do not have throw rugs and other things on the floor that can make you trip. What can I do in the bedroom? Use night lights. Make sure that you have a light by your bed that is easy to reach. Do not use any sheets or blankets that are too big for your bed. They should not hang down onto the floor. Have a firm chair that has side arms. You can use this for support while you get dressed. Do not have throw rugs and other things on the floor that can make you trip. What can I do in the kitchen? Clean up any spills right away. Avoid walking on wet floors. Keep items that you use a lot in easy-to-reach places. If you need to reach something above you, use a strong step stool that has a grab bar. Keep electrical cords out of the way. Do not use floor polish or wax that makes floors slippery. If you must use wax, use non-skid floor wax. Do not have throw rugs and other things on the floor that can make you trip. What can I do with my stairs? Do not leave any items on the stairs. Make sure that there are handrails on both sides of the stairs and use them. Fix handrails that are broken or loose. Make sure that handrails are as long as the stairways. Check any carpeting to make sure that it is firmly attached to the stairs. Fix any carpet that is loose or worn. Avoid having throw rugs at the top or bottom of the stairs. If you do have throw rugs, attach them to the floor with carpet tape. Make sure that you have a light switch at the top of the stairs and the bottom of the stairs. If you do not have them, ask someone to add  them for you. What else can I do to help prevent falls? Wear shoes that: Do not have high heels. Have rubber bottoms. Are comfortable and fit you well. Are closed at the toe. Do not wear sandals. If you use a stepladder: Make sure that it is fully opened. Do not climb a closed stepladder. Make sure that both sides of the stepladder are locked into place. Ask someone to hold it for you, if possible. Clearly  mark and make sure that you can see: Any grab bars or handrails. First and last steps. Where the edge of each step is. Use tools that help you move around (mobility aids) if they are needed. These include: Canes. Walkers. Scooters. Crutches. Turn on the lights when you go into a dark area. Replace any light bulbs as soon as they burn out. Set up your furniture so you have a clear path. Avoid moving your furniture around. If any of your floors are uneven, fix them. If there are any pets around you, be aware of where they are. Review your medicines with your doctor. Some medicines can make you feel dizzy. This can increase your chance of falling. Ask your doctor what other things that you can do to help prevent falls. This information is not intended to replace advice given to you by your health care provider. Make sure you discuss any questions you have with your health care provider. Document Released: 09/21/2009 Document Revised: 05/02/2016 Document Reviewed: 12/30/2014 Elsevier Interactive Patient Education  2017 [employer].

## 2023-05-29 DIAGNOSIS — M950 Acquired deformity of nose: Secondary | ICD-10-CM | POA: Diagnosis not present

## 2023-06-03 DIAGNOSIS — M950 Acquired deformity of nose: Secondary | ICD-10-CM | POA: Diagnosis not present

## 2023-06-03 DIAGNOSIS — C44321 Squamous cell carcinoma of skin of nose: Secondary | ICD-10-CM | POA: Diagnosis not present

## 2023-06-16 ENCOUNTER — Other Ambulatory Visit (INDEPENDENT_AMBULATORY_CARE_PROVIDER_SITE_OTHER): Payer: Medicare Other

## 2023-06-16 ENCOUNTER — Telehealth: Payer: Self-pay | Admitting: Family Medicine

## 2023-06-16 DIAGNOSIS — E781 Pure hyperglyceridemia: Secondary | ICD-10-CM

## 2023-06-16 DIAGNOSIS — D75839 Thrombocytosis, unspecified: Secondary | ICD-10-CM | POA: Diagnosis not present

## 2023-06-16 DIAGNOSIS — E039 Hypothyroidism, unspecified: Secondary | ICD-10-CM

## 2023-06-16 LAB — LIPID PANEL
Cholesterol: 189 mg/dL (ref 0–200)
HDL: 63.8 mg/dL (ref >39.00)
NonHDL: 125.31
Total CHOL/HDL Ratio: 3
Triglycerides: 235 mg/dL — ABNORMAL HIGH (ref 0.0–149.0)
VLDL: 47 mg/dL — ABNORMAL HIGH (ref 0.0–40.0)

## 2023-06-16 LAB — CBC WITH DIFFERENTIAL/PLATELET
Basophils Relative: 0.9 %
Eosinophils Absolute: 180 cells/uL (ref 15–500)
MCH: 28.2 pg (ref 27.0–33.0)
MPV: 9.8 fL (ref 7.5–12.5)
Monocytes Relative: 7.9 %
RBC: 4.51 10*6/uL (ref 3.80–5.10)
WBC: 8.2 10*3/uL (ref 3.8–10.8)

## 2023-06-16 LAB — LDL CHOLESTEROL, DIRECT: Direct LDL: 88 mg/dL

## 2023-06-16 LAB — TSH: TSH: 3.86 u[IU]/mL (ref 0.35–5.50)

## 2023-06-16 NOTE — Telephone Encounter (Signed)
Called patient reviewed all information and repeated back to me. Will call if any questions.  ? ?

## 2023-06-16 NOTE — Addendum Note (Signed)
Addended by: Alvina Chou on: 06/16/2023 07:34 AM   Modules accepted: Orders

## 2023-06-16 NOTE — Telephone Encounter (Signed)
Cholesterol is improved TSH in the normal range now   Continue current medicines   (I tried to do a result note and had a technical issue- so this is a phone note

## 2023-06-17 LAB — CBC WITH DIFFERENTIAL/PLATELET
Absolute Monocytes: 648 cells/uL (ref 200–950)
Basophils Absolute: 74 cells/uL (ref 0–200)
Eosinophils Relative: 2.2 %
HCT: 38.5 % (ref 35.0–45.0)
Hemoglobin: 12.7 g/dL (ref 11.7–15.5)
Lymphs Abs: 2214 cells/uL (ref 850–3900)
MCHC: 33 g/dL (ref 32.0–36.0)
MCV: 85.4 fL (ref 80.0–100.0)
Neutro Abs: 5084 cells/uL (ref 1500–7800)
Neutrophils Relative %: 62 %
Platelets: 496 10*3/uL — ABNORMAL HIGH (ref 140–400)
RDW: 12.8 % (ref 11.0–15.0)
Total Lymphocyte: 27 %

## 2023-06-17 LAB — PATHOLOGIST SMEAR REVIEW

## 2023-06-17 NOTE — Progress Notes (Signed)
Labs look improved  Platelet count came down back into the 400s  I want to re check this in 2-3 months with iron level  TSH is in the normal range now  Cholesterol is improved

## 2023-06-26 DIAGNOSIS — N1330 Unspecified hydronephrosis: Secondary | ICD-10-CM | POA: Diagnosis not present

## 2023-06-26 DIAGNOSIS — N261 Atrophy of kidney (terminal): Secondary | ICD-10-CM | POA: Diagnosis not present

## 2023-07-02 ENCOUNTER — Other Ambulatory Visit: Payer: Self-pay | Admitting: Family Medicine

## 2023-07-22 DIAGNOSIS — N1832 Chronic kidney disease, stage 3b: Secondary | ICD-10-CM | POA: Diagnosis not present

## 2023-07-22 DIAGNOSIS — N390 Urinary tract infection, site not specified: Secondary | ICD-10-CM | POA: Diagnosis not present

## 2023-07-22 DIAGNOSIS — N200 Calculus of kidney: Secondary | ICD-10-CM | POA: Diagnosis not present

## 2023-07-22 DIAGNOSIS — I129 Hypertensive chronic kidney disease with stage 1 through stage 4 chronic kidney disease, or unspecified chronic kidney disease: Secondary | ICD-10-CM | POA: Diagnosis not present

## 2023-07-28 ENCOUNTER — Encounter: Payer: Self-pay | Admitting: Nephrology

## 2023-07-31 DIAGNOSIS — H6123 Impacted cerumen, bilateral: Secondary | ICD-10-CM | POA: Diagnosis not present

## 2023-07-31 DIAGNOSIS — H902 Conductive hearing loss, unspecified: Secondary | ICD-10-CM | POA: Diagnosis not present

## 2023-08-10 ENCOUNTER — Encounter: Payer: Self-pay | Admitting: Family Medicine

## 2023-08-12 NOTE — Telephone Encounter (Signed)
Please schedule appt in office.

## 2023-08-13 NOTE — Telephone Encounter (Signed)
Pt scheduled for 08/19/23 @ 11:30a

## 2023-08-19 ENCOUNTER — Ambulatory Visit (INDEPENDENT_AMBULATORY_CARE_PROVIDER_SITE_OTHER): Payer: Medicare Other | Admitting: Family Medicine

## 2023-08-19 ENCOUNTER — Encounter: Payer: Self-pay | Admitting: Family Medicine

## 2023-08-19 VITALS — BP 141/65 | HR 76 | Temp 97.9°F | Ht 65.5 in | Wt 184.0 lb

## 2023-08-19 DIAGNOSIS — D75839 Thrombocytosis, unspecified: Secondary | ICD-10-CM

## 2023-08-19 DIAGNOSIS — N1832 Chronic kidney disease, stage 3b: Secondary | ICD-10-CM

## 2023-08-19 DIAGNOSIS — I10 Essential (primary) hypertension: Secondary | ICD-10-CM

## 2023-08-19 DIAGNOSIS — J301 Allergic rhinitis due to pollen: Secondary | ICD-10-CM | POA: Diagnosis not present

## 2023-08-19 DIAGNOSIS — E039 Hypothyroidism, unspecified: Secondary | ICD-10-CM | POA: Diagnosis not present

## 2023-08-19 DIAGNOSIS — R61 Generalized hyperhidrosis: Secondary | ICD-10-CM

## 2023-08-19 LAB — CBC WITH DIFFERENTIAL/PLATELET
Basophils Absolute: 0.1 10*3/uL (ref 0.0–0.1)
Basophils Relative: 0.7 % (ref 0.0–3.0)
Eosinophils Absolute: 0.1 10*3/uL (ref 0.0–0.7)
Eosinophils Relative: 1.6 % (ref 0.0–5.0)
HCT: 40.4 % (ref 36.0–46.0)
Hemoglobin: 13 g/dL (ref 12.0–15.0)
Lymphocytes Relative: 22.7 % (ref 12.0–46.0)
Lymphs Abs: 2 10*3/uL (ref 0.7–4.0)
MCHC: 32.1 g/dL (ref 30.0–36.0)
MCV: 88.2 fl (ref 78.0–100.0)
Monocytes Absolute: 0.5 10*3/uL (ref 0.1–1.0)
Monocytes Relative: 6.3 % (ref 3.0–12.0)
Neutro Abs: 5.9 10*3/uL (ref 1.4–7.7)
Neutrophils Relative %: 68.7 % (ref 43.0–77.0)
Platelets: 501 10*3/uL — ABNORMAL HIGH (ref 150.0–400.0)
RBC: 4.58 Mil/uL (ref 3.87–5.11)
RDW: 13.7 % (ref 11.5–15.5)
WBC: 8.6 10*3/uL (ref 4.0–10.5)

## 2023-08-19 LAB — BASIC METABOLIC PANEL
BUN: 22 mg/dL (ref 6–23)
CO2: 25 meq/L (ref 19–32)
Calcium: 9.7 mg/dL (ref 8.4–10.5)
Chloride: 103 meq/L (ref 96–112)
Creatinine, Ser: 1.21 mg/dL — ABNORMAL HIGH (ref 0.40–1.20)
GFR: 44.71 mL/min — ABNORMAL LOW (ref >60.00)
Glucose, Bld: 89 mg/dL (ref 70–99)
Potassium: 4.9 meq/L (ref 3.5–5.1)
Sodium: 136 meq/L (ref 135–145)

## 2023-08-19 LAB — IRON: Iron: 102 ug/dL (ref 42–145)

## 2023-08-19 LAB — TSH: TSH: 2.98 u[IU]/mL (ref 0.35–5.50)

## 2023-08-19 LAB — FERRITIN: Ferritin: 78.5 ng/mL (ref 10.0–291.0)

## 2023-08-19 NOTE — Assessment & Plan Note (Signed)
Some cough- scant clear phlegm in am/pm  Discussed symptom care Tries to avoid weed pollen  Reassuring exam Will call if this worsens or changes

## 2023-08-19 NOTE — Assessment & Plan Note (Signed)
Hot flases and heat intolerance has worsened  Normal HR but on BB Some jitteriness  Went down on levothyroxine to 88 mcg this spring and TSH improved in July  Re check this today

## 2023-08-19 NOTE — Assessment & Plan Note (Addendum)
Last Plt count was 496 Reviewed path report   Cbc today with iron studies   No bleeding or bruising

## 2023-08-19 NOTE — Assessment & Plan Note (Signed)
Lab today  One functioning kidney  Is drinking fluids  Sees nephrology

## 2023-08-19 NOTE — Progress Notes (Signed)
Subjective:    Patient ID: [name], female    DOB: Feb 16, 1951, 72 y.o.   MRN: [id number]  HPI  Wt Readings from Last 3 Encounters:  08/19/23 184 lb (83.5 kg)  05/28/23 181 lb (82.1 kg)  05/12/23 178 lb (80.7 kg)   30.15 kg/m  Vitals:   08/19/23 1115 08/19/23 1139  BP: (!) 148/64 (!) 141/65  Pulse: 76   Temp: 97.9 F (36.6 C)   SpO2: 95%     Pt presents for c/o hot flashes and night sweats   No weight loss  A little anxious and jittery feeling still   Spells of feeling hot  Starts in mid body and extends out  Hair /head - sweats  1-3 times per day  Lasts about 30 minutes   Wakes up at night soaked   No fevers   Really tired also   When she saw nephrology- had uti she did not know she had  Treated for that   No new stress  Is retired   Some cough from allergies- am and pm     HTN bp is stable today  No cp or palpitations or headaches or edema  No side effects to medicines  BP Readings from Last 3 Encounters:  08/19/23 (!) 141/65  05/12/23 116/74  08/22/22 118/70    Propanolol ER 120 mg daily  Amlodipine 10 mg daily   Pulse Readings from Last 3 Encounters:  08/19/23 76  05/12/23 76  08/22/22 70      Hypothyroid Lab Results  Component Value Date   TSH 3.86 06/16/2023   Taking 88 mcg daily  (had changed from 100)   History of past hyperthyroidism and thyroid storm   Lab Results  Component Value Date   NA 140 05/06/2023   K 4.6 05/06/2023   CO2 27 05/06/2023   GLUCOSE 117 (H) 05/06/2023   BUN 13 05/06/2023   CREATININE 1.09 05/06/2023   CALCIUM 9.6 05/06/2023   GFR 50.78 (L) 05/06/2023   GFRNONAA 54 (L) 10/24/2021   Lab Results  Component Value Date   WBC 8.2 06/16/2023   HGB 12.7 06/16/2023   HCT 38.5 06/16/2023   MCV 85.4 06/16/2023   PLT 496 (H) 06/16/2023   Plan to check iron studies with next labs    Patient Active Problem List   Diagnosis Date Noted   Stage 3b chronic kidney disease (HCC) 05/12/2023    Hemorrhoids 10/23/2018   Renal insufficiency 10/23/2018   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    Estrogen deficiency 04/15/2018   Bladder prolapse, female, 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   Dyspepsia 04/14/2017   Colon cancer screening 04/09/2017   Prediabetes 03/30/2017   Medicare annual wellness visit, initial 04/04/2016   Other screening mammogram 12/31/2011   Post-menopausal 12/31/2011   Routine general medical examination at a health care facility 12/23/2011   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 unaware   Asthma    controlled w/ singulair   Herpes simplex without mention of complication    controlled w/ meds (no current fever blisters)   History of hiatal hernia    History  of kidney stones    HLD (hyperlipidemia)    borderline-  no meds   HTN (hypertension)    echo- 2006   Hyperthyroidism hx ptu 2006   takes synthroid; had Graves disease, was treated for that- now thyroid doesn't work   Leukocytosis, unspecified hx of 10 yrs ago   no problem since   Migraine    Mixed incontinence    urge and stress incontinence   Renal cyst right    pt is unsure of this, has had kidney stones   Ureteral calculi right    s/p laser  litho w/ stone extraction 01-28-11   Past Surgical History:  Procedure Laterality Date   BIOPSY  10/16/2018   Procedure: BIOPSY;  Surgeon: Rachael Fee, MD;  Location: [facility] ENDOSCOPY;  Service: Endoscopy;;   BREAST REDUCTION SURGERY  1994   CERVICAL FUSION  2000   fusion c4-6   CERVICAL FUSION  2005   fusion c6-7 and plate removal U0-4   COLONOSCOPY     CYSTOSCOPY W/ RETROGRADES  10/21/2011    Procedure: CYSTOSCOPY WITH RETROGRADE PYELOGRAM;  Surgeon: Kathi Ludwig, MD;  Location: [facility];  Service: Urology;  Laterality: Right;  CYSTOSCOPY RIGHT RETROGRADE, PYELOGRAM  URETEROSCOPY WITH HOLMIUM LASER AND STONE EXTRACTION   CYSTOSCOPY/RETROGRADE/URETEROSCOPY/STONE EXTRACTION WITH BASKET  01/28/2011   right    CYSTOSCOPY/URETEROSCOPY/HOLMIUM LASER/STENT PLACEMENT Right 11/16/2021   Procedure: CYSTOSCOPY/RETROGRADE/URETEROSCOPY/HOLMIUM LASER/STENT PLACEMENT;  Surgeon: Jannifer Hick, MD;  Location: [facility] ORS;  Service: Urology;  Laterality: Right;   ESOPHAGOGASTRODUODENOSCOPY (EGD) WITH PROPOFOL N/A 10/16/2018   Procedure: ESOPHAGOGASTRODUODENOSCOPY (EGD) WITH PROPOFOL;  Surgeon: Rachael Fee, MD;  Location: [facility] ENDOSCOPY;  Service: Endoscopy;  Laterality: N/A;   MOHS SURGERY  04/2023   REDUCTION MAMMAPLASTY Bilateral 1995   TONSILLECTOMY  1960   VAGINAL HYSTERECTOMY  1987   fibroids/anemia   Social History   Tobacco Use   Smoking status: Never   Smokeless tobacco: Never  Vaping Use   Vaping status: Never Used  Substance Use Topics   Alcohol use: No    Alcohol/week: 0.0 standard drinks of alcohol   Drug use: No   Family History  Problem Relation Age of Onset   Osteoporosis Mother    Diabetes Mother    Heart disease Father    Heart failure Father    Diabetes Other        Grandfather   Coronary artery disease Other        Grandmother   Breast cancer Neg Hx    Colon cancer Neg Hx    Esophageal cancer Neg Hx    Stomach cancer Neg Hx    Rectal cancer Neg Hx    Thyroid disease Neg Hx    Allergies  Allergen Reactions   Contrast Media [Iodinated Contrast Media] Anaphylaxis   Shellfish-Derived Products Anaphylaxis   Septra [Sulfamethoxazole-Trimethoprim] Nausea Only   Current Outpatient Medications on File Prior to Visit  Medication Sig Dispense Refill   acetaminophen (TYLENOL) 500 MG tablet Take 1,000 mg by mouth every 6 (six) hours as needed  for moderate pain.     amLODipine (NORVASC) 10 MG tablet TAKE 1 TABLET BY MOUTH EVERY DAY 90 tablet 2   Calcium Carb-Cholecalciferol (CALCIUM 500/D PO) Take 1 tablet by mouth daily.     Cyanocobalamin (B-12 PO) Take 1 capsule by mouth daily.     fenofibrate 54 MG tablet TAKE 1 TABLET BY MOUTH DAILY 90 tablet 1   levothyroxine (SYNTHROID) 88 MCG tablet Take  1 tablet (88 mcg total) by mouth daily. 90 tablet 3   mirabegron ER (MYRBETRIQ) 50 MG TB24 tablet TAKE 1 TABLET BY MOUTH DAILY 90 tablet 0   montelukast (SINGULAIR) 10 MG tablet TAKE 1 TABLET BY MOUTH DAILY 90 tablet 0   Multiple Vitamin (MULTIVITAMIN) tablet Take 1 tablet by mouth daily.     propranolol ER (INDERAL LA) 120 MG 24 hr capsule TAKE 1 CAPSULE BY MOUTH DAILY 90 capsule 0   SUMAtriptan (IMITREX) 100 MG tablet TAKE 1 TABLET FOR HEADACHE MAY REPEAT ONCE IN 2 HOURS IF NEEDED. MAXIMUM OF 2    TABLETS IN ONE DAY. (Patient taking differently: Take 100 mg by mouth as needed for migraine or headache. TAKE 100 mg TABLET FOR HEADACHE MAY REPEAT ONCE IN 2 HOURS IF NEEDED. MAXIMUM OF 200 mg    TABLETS IN ONE DAY.) 27 tablet 3   No current facility-administered medications on file prior to visit.    Review of Systems  Constitutional:  Negative for activity change, appetite change, fatigue, fever and unexpected weight change.  HENT:  Positive for postnasal drip, rhinorrhea and sneezing. Negative for congestion, ear pain, sinus pressure, sore throat and trouble swallowing.   Eyes:  Negative for pain, redness and visual disturbance.  Respiratory:  Negative for cough, shortness of breath and wheezing.   Cardiovascular:  Negative for chest pain and palpitations.  Gastrointestinal:  Negative for abdominal pain, blood in stool, constipation and diarrhea.  Endocrine: Positive for heat intolerance. Negative for polydipsia, polyphagia and polyuria.  Genitourinary:  Negative for dysuria, frequency and urgency.  Musculoskeletal:  Negative for  arthralgias, back pain and myalgias.  Skin:  Negative for pallor and rash.  Allergic/Immunologic: Negative for environmental allergies.  Neurological:  Negative for dizziness, syncope, facial asymmetry, light-headedness and headaches.  Hematological:  Negative for adenopathy. Does not bruise/bleed easily.  Psychiatric/Behavioral:  Negative for decreased concentration and dysphoric mood. The patient is not nervous/anxious.        Objective:   Physical Exam Constitutional:      General: She is not in acute distress.    Appearance: Normal appearance. She is well-developed. She is obese. She is not ill-appearing or diaphoretic.  HENT:     Head: Normocephalic and atraumatic.     Right Ear: Tympanic membrane and ear canal normal.     Left Ear: Tympanic membrane and ear canal normal.     Mouth/Throat:     Mouth: Mucous membranes are moist.  Eyes:     Conjunctiva/sclera: Conjunctivae normal.     Pupils: Pupils are equal, round, and reactive to light.  Neck:     Thyroid: No thyromegaly.     Vascular: No carotid bruit or JVD.     Comments: No change in thyroid exam Cardiovascular:     Rate and Rhythm: Normal rate and regular rhythm.     Heart sounds: Normal heart sounds.     No gallop.  Pulmonary:     Effort: Pulmonary effort is normal. No respiratory distress.     Breath sounds: Normal breath sounds. No stridor. No wheezing, rhonchi or rales.  Abdominal:     General: There is no distension or abdominal bruit.     Palpations: Abdomen is soft.     Tenderness: There is no abdominal tenderness.  Musculoskeletal:     Cervical back: Normal range of motion and neck supple. No tenderness.     Right lower leg: No edema.     Left lower leg: No edema.  Lymphadenopathy:     Cervical: No cervical adenopathy.  Skin:    General: Skin is warm and dry.     Coloration: Skin is not jaundiced or pale.     Findings: No bruising or rash.  Neurological:     Mental Status: She is alert.      Coordination: Coordination normal.     Deep Tendon Reflexes: Reflexes are normal and symmetric. Reflexes normal.     Comments: No significant tremor   Psychiatric:        Mood and Affect: Mood normal.           Assessment & Plan:   Problem List Items Addressed This Visit       Cardiovascular and Mediastinum   Essential hypertension - Primary    Blood pressure is above her baseline today  Is having hot flashes as well  BP: (!) 141/65   Propranolol ER 120 mg daily  Amlodipine 10 mg daily   Rate is controlled   Lab today  Did have recent levothyroxine dose change       Relevant Orders   TSH   CBC with Differential/Platelet     Respiratory   Allergic rhinitis    Some cough- scant clear phlegm in am/pm  Discussed symptom care Tries to avoid weed pollen  Reassuring exam Will call if this worsens or changes          Endocrine   Hypothyroidism    Hot flases and heat intolerance has worsened  Normal HR but on BB Some jitteriness  Went down on levothyroxine to 88 mcg this spring and TSH improved in July  Re check this today         Relevant Orders   TSH     Genitourinary   Stage 3b chronic kidney disease (HCC)    Lab today  One functioning kidney  Is drinking fluids  Sees nephrology      Relevant Orders   Basic metabolic panel     Hematopoietic and Hemostatic   Thrombocytosis (HCC)    Last Plt count was 496 Reviewed path report   Cbc today with iron studies   No bleeding or bruising       Relevant Orders   CBC with Differential/Platelet   Ferritin   Iron

## 2023-08-19 NOTE — Patient Instructions (Addendum)
Labs today   Thyroid / iron/ cbc/ renal    Then we will make a plan   Take care of yourself    If the cough worsens also let us know

## 2023-08-19 NOTE — Assessment & Plan Note (Signed)
Blood pressure is above her baseline today  Is having hot flashes as well  BP: (!) 141/65   Propranolol ER 120 mg daily  Amlodipine 10 mg daily   Rate is controlled   Lab today  Did have recent levothyroxine dose change

## 2023-08-21 DIAGNOSIS — R61 Generalized hyperhidrosis: Secondary | ICD-10-CM | POA: Insufficient documentation

## 2023-08-21 NOTE — Addendum Note (Signed)
Addended by: Roxy Manns A on: 08/21/2023 05:06 PM   Modules accepted: Orders

## 2023-09-01 ENCOUNTER — Telehealth: Payer: Self-pay | Admitting: Family Medicine

## 2023-09-01 DIAGNOSIS — E781 Pure hyperglyceridemia: Secondary | ICD-10-CM

## 2023-09-01 NOTE — Telephone Encounter (Signed)
-----   Message from Lovena Neighbours sent at 08/22/2023 11:44 AM EDT ----- Regarding: Labs for Wednesday 9.25.24 Please put lab orders in future. Thank you, Denny Peon

## 2023-09-03 ENCOUNTER — Other Ambulatory Visit (INDEPENDENT_AMBULATORY_CARE_PROVIDER_SITE_OTHER): Payer: Medicare Other

## 2023-09-03 DIAGNOSIS — E781 Pure hyperglyceridemia: Secondary | ICD-10-CM

## 2023-09-04 LAB — LIPID PANEL
Cholesterol: 189 mg/dL (ref 0–200)
HDL: 67.3 mg/dL (ref >39.00)
LDL Cholesterol: 68 mg/dL (ref 0–99)
NonHDL: 121.81
Total CHOL/HDL Ratio: 3
Triglycerides: 270 mg/dL — ABNORMAL HIGH (ref 0.0–149.0)
VLDL: 54 mg/dL — ABNORMAL HIGH (ref 0.0–40.0)

## 2023-09-05 ENCOUNTER — Other Ambulatory Visit: Payer: Self-pay

## 2023-09-05 MED ORDER — FENOFIBRATE 145 MG PO TABS: 145.0000 mg | ORAL_TABLET | Freq: Every day | ORAL | 3 refills | Status: DC

## 2023-09-12 NOTE — Progress Notes (Unsigned)
[facility] [phone]   [phone]  CONSULT NOTE  REFERRING PHYSICIAN:   REASON FOR CONSULTATION:  Thrombocytosis  HPI Kollins B Maske is a 72 y.o. female with a past medical history significant for hypertension, hemorrhoids, asthma, hypothyroidism, bladder prolapse, kidney disease, thrombocytosis, HSV, sturgeon deficiency, and prediabetes is referred to the clinic for thrombocytosis.  The patient had a follow-up visit with her PCP on 08/19/2023.  At that point in time she reported hot flashes and night sweats.  CBC showed normal white blood cell count at 8.6, normal hemoglobin at 13; however, she had elevated platelet count at 501K.  Per chart review, it appears that she has had elevated platelet count for many years.  Oldest labs I have are dating back to 16 years ago to 2008 and she has had elevated platelet count persistently since that time, although the values have fluctuated.  The highest platelet count AC is 784.  PCP also ordered iron studies to ensure she did not have reactive thrombocytosis and her iron was normal at 102 and her ferritin was normal at 78.5.   Today, the patient is feeling fairly well without any concerning complaints except for ***. She states that she has been doing well. She denies any fever, chills, or lymphadenopathy. ***NS. Health screenings***. She denies any nausea, vomiting, diarrhea, constipation, abdominal pain, or abdominal fullness. She denies any abnormal bleeding or bruising, specifically, she denies melena, hematuria, hematochezia, abnormal vaginal bleeding, or gingival bleeding. She denies recent steroids or hormone use***. She denies any recent infections. She denies any rashes. She denies any unusual headaches, lightheadedness (except when she does not eat she may get lightheaded), syncope, or visual changes.  She denies numbness or tingling in the hands or feet. She denies any numbness and tingling in her hands and  feet.  She denies history of DVT, MI, PE, or CVA. Denies splenectomy. Denies IBS/IBD. Denies any chronic kidney disease***.  She denies any particular dietary habits such as being a vegan or vegetarian except she does *** eat a lot of red meat due to preference.  She denies any history of bariatric surgery.   She denies vitamin deficiencies. She does report she takes a ***    HPI  Past Medical History:  Diagnosis Date   Allergic rhinitis    Allergy    Anemia    b12 def.pt. unaware   Asthma    controlled w/ singulair   Herpes simplex without mention of complication    controlled w/ meds (no current fever blisters)   History of hiatal hernia    History of kidney stones    HLD (hyperlipidemia)    borderline-  no meds   HTN (hypertension)    echo- 2006   Hyperthyroidism hx ptu 2006   takes synthroid; had Graves disease, was treated for that- now thyroid doesn't work   Leukocytosis, unspecified hx of 10 yrs ago   no problem since   Migraine    Mixed incontinence    urge and stress incontinence   Renal cyst right    pt is unsure of this, has had kidney stones   Ureteral calculi right    s/p laser  litho w/ stone extraction 01-28-11    Past Surgical History:  Procedure Laterality Date   BIOPSY  10/16/2018   Procedure: BIOPSY;  Surgeon: Rachael Fee, MD;  Location: [facility] ENDOSCOPY;  Service: Endoscopy;;   BREAST REDUCTION SURGERY  1994   CERVICAL FUSION  2000   fusion c4-6   CERVICAL FUSION  2005   fusion c6-7 and plate removal W1-0   COLONOSCOPY     CYSTOSCOPY W/ RETROGRADES  10/21/2011   Procedure: CYSTOSCOPY WITH RETROGRADE PYELOGRAM;  Surgeon: Kathi Ludwig, MD;  Location: [facility];  Service: Urology;  Laterality: Right;  CYSTOSCOPY RIGHT RETROGRADE, PYELOGRAM  URETEROSCOPY WITH HOLMIUM LASER AND STONE EXTRACTION   CYSTOSCOPY/RETROGRADE/URETEROSCOPY/STONE EXTRACTION WITH BASKET  01/28/2011   right    CYSTOSCOPY/URETEROSCOPY/HOLMIUM LASER/STENT  PLACEMENT Right 11/16/2021   Procedure: CYSTOSCOPY/RETROGRADE/URETEROSCOPY/HOLMIUM LASER/STENT PLACEMENT;  Surgeon: Jannifer Hick, MD;  Location: [facility];  Service: Urology;  Laterality: Right;   ESOPHAGOGASTRODUODENOSCOPY (EGD) WITH PROPOFOL N/A 10/16/2018   Procedure: ESOPHAGOGASTRODUODENOSCOPY (EGD) WITH PROPOFOL;  Surgeon: Rachael Fee, MD;  Location: [facility] ENDOSCOPY;  Service: Endoscopy;  Laterality: N/A;   MOHS SURGERY  04/2023   REDUCTION MAMMAPLASTY Bilateral 1995   TONSILLECTOMY  1960   VAGINAL HYSTERECTOMY  1987   fibroids/anemia    Family History  Problem Relation Age of Onset   Osteoporosis Mother    Diabetes Mother    Heart disease Father    Heart failure Father    Diabetes Other        Grandfather   Coronary artery disease Other        Grandmother   Breast cancer Neg Hx    Colon cancer Neg Hx    Esophageal cancer Neg Hx    Stomach cancer Neg Hx    Rectal cancer Neg Hx    Thyroid disease Neg Hx     Social History Social History   Tobacco Use   Smoking status: Never   Smokeless tobacco: Never  Vaping Use   Vaping status: Never Used  Substance Use Topics   Alcohol use: No    Alcohol/week: 0.0 standard drinks of alcohol   Drug use: No    Allergies  Allergen Reactions   Contrast Media [Iodinated Contrast Media] Anaphylaxis   Shellfish-Derived Products Anaphylaxis   Septra [Sulfamethoxazole-Trimethoprim] Nausea Only    Current Outpatient Medications  Medication Sig Dispense Refill   acetaminophen (TYLENOL) 500 MG tablet Take 1,000 mg by mouth every 6 (six) hours as needed for moderate pain.     amLODipine (NORVASC) 10 MG tablet TAKE 1 TABLET BY MOUTH EVERY DAY 90 tablet 2   Calcium Carb-Cholecalciferol (CALCIUM 500/D PO) Take 1 tablet by mouth daily.     Cyanocobalamin (B-12 PO) Take 1 capsule by mouth daily.     fenofibrate (TRICOR) 145 MG tablet Take 1 tablet (145 mg total) by mouth daily. 90 tablet 3   levothyroxine (SYNTHROID) 88 MCG tablet Take 1  tablet (88 mcg total) by mouth daily. 90 tablet 3   mirabegron ER (MYRBETRIQ) 50 MG TB24 tablet TAKE 1 TABLET BY MOUTH DAILY 90 tablet 0   montelukast (SINGULAIR) 10 MG tablet TAKE 1 TABLET BY MOUTH DAILY 90 tablet 0   Multiple Vitamin (MULTIVITAMIN) tablet Take 1 tablet by mouth daily.     propranolol ER (INDERAL LA) 120 MG 24 hr capsule TAKE 1 CAPSULE BY MOUTH DAILY 90 capsule 0   SUMAtriptan (IMITREX) 100 MG tablet TAKE 1 TABLET FOR HEADACHE MAY REPEAT ONCE IN 2 HOURS IF NEEDED. MAXIMUM OF 2    TABLETS IN ONE DAY. (Patient taking differently: Take 100 mg by mouth as needed for migraine or headache. TAKE 100 mg TABLET FOR HEADACHE MAY REPEAT ONCE IN 2 HOURS IF NEEDED. MAXIMUM OF 200 mg    TABLETS  IN ONE DAY.) 27 tablet 3   No current facility-administered medications for this visit.    REVIEW OF SYSTEMS:   Review of Systems  Constitutional: Negative for appetite change, chills, fatigue, fever and unexpected weight change.  HENT:   Negative for mouth sores, nosebleeds, sore throat and trouble swallowing.   Eyes: Negative for eye problems and icterus.  Respiratory: Negative for cough, hemoptysis, shortness of breath and wheezing.   Cardiovascular: Negative for chest pain and leg swelling.  Gastrointestinal: Negative for abdominal pain, constipation, diarrhea, nausea and vomiting.  Genitourinary: Negative for bladder incontinence, difficulty urinating, dysuria, frequency and hematuria.   Musculoskeletal: Negative for back pain, gait problem, neck pain and neck stiffness.  Skin: Negative for itching and rash.  Neurological: Negative for dizziness, extremity weakness, gait problem, headaches, light-headedness and seizures.  Hematological: Negative for adenopathy. Does not bruise/bleed easily.  Psychiatric/Behavioral: Negative for confusion, depression and sleep disturbance. The patient is not nervous/anxious.     PHYSICAL EXAMINATION:  There were no vitals taken for this visit.  ECOG  PERFORMANCE STATUS: {CHL ONC ECOG [id number]  Physical Exam  Constitutional: Oriented to person, place, and time and well-developed, well-nourished, and in no distress. No distress.  HENT:  Head: Normocephalic and atraumatic.  Mouth/Throat: Oropharynx is clear and moist. No oropharyngeal exudate.  Eyes: Conjunctivae are normal. Right eye exhibits no discharge. Left eye exhibits no discharge. No scleral icterus.  Neck: Normal range of motion. Neck supple.  Cardiovascular: Normal rate, regular rhythm, normal heart sounds and intact distal pulses.   Pulmonary/Chest: Effort normal and breath sounds normal. No respiratory distress. No wheezes. No rales.  Abdominal: Soft. Bowel sounds are normal. Exhibits no distension and no mass. There is no tenderness.  Musculoskeletal: Normal range of motion. Exhibits no edema.  Lymphadenopathy:    No cervical adenopathy.  Neurological: Alert and oriented to person, place, and time. Exhibits normal muscle tone. Gait normal. Coordination normal.  Skin: Skin is warm and dry. No rash noted. Not diaphoretic. No erythema. No pallor.  Psychiatric: Mood, memory and judgment normal.  Vitals reviewed.  LABORATORY DATA: Lab Results  Component Value Date   WBC 8.6 08/19/2023   HGB 13.0 08/19/2023   HCT 40.4 08/19/2023   MCV 88.2 08/19/2023   PLT 501.0 (H) 08/19/2023      Chemistry      Component Value Date/Time   NA 136 08/19/2023 1148   K 4.9 08/19/2023 1148   CL 103 08/19/2023 1148   CO2 25 08/19/2023 1148   BUN 22 08/19/2023 1148   CREATININE 1.21 (H) 08/19/2023 1148   CREATININE 1.89 (H) 10/23/2018 1626      Component Value Date/Time   CALCIUM 9.7 08/19/2023 1148   ALKPHOS 70 05/06/2023 0727   AST 15 05/06/2023 0727   ALT 10 05/06/2023 0727   BILITOT 0.7 05/06/2023 0727       RADIOGRAPHIC STUDIES: No results found.  ASSESSMENT: This is a very pleasant 72 year old female referred to the clinic for thrombocytosis   The patient had  several lab studies today including CBC, CMP, iron studies, ferritin, and JAK2 to mutation testing.  Labs today show elevated platelet count of ***k.    The patient was seen with Dr. Arbutus Ped today.   Since she has had elevated platelet count for several years, we will wait for the JAK2 mutation testing to rule out myeloproliferative disorder.   I will call her with the results of the JAK2 mutation testing. If positive, we will bring her  back to the clinic to discuss treatment. If negative, we will arrange for follow up in a few months for surveillance.     The patient voices understanding of current disease status and treatment options and is in agreement with the current care plan.  All questions were answered. The patient knows to call the clinic with any problems, questions or concerns. We can certainly see the patient much sooner if necessary.  Thank you so much for allowing me to participate in the care of Merve B Nauert. I will continue to follow up the patient with you and assist in her care.  I spent {CHL ONC TIME VISIT - JXBJY:[id number]} counseling the patient face to face. The total time spent in the appointment was {CHL ONC TIME VISIT - QMVHQ:[id number]}.  Disclaimer: This note was dictated with voice recognition software. Similar sounding words can inadvertently be transcribed and may not be corrected upon review.   Dequincy Born L Brenisha Tsui September 12, 2023, 11:01 AM

## 2023-09-14 ENCOUNTER — Other Ambulatory Visit: Payer: Self-pay | Admitting: Family Medicine

## 2023-09-15 ENCOUNTER — Other Ambulatory Visit: Payer: Self-pay | Admitting: Physician Assistant

## 2023-09-15 DIAGNOSIS — D75839 Thrombocytosis, unspecified: Secondary | ICD-10-CM

## 2023-09-16 ENCOUNTER — Other Ambulatory Visit: Payer: Self-pay

## 2023-09-16 ENCOUNTER — Inpatient Hospital Stay: Payer: Medicare Other | Attending: Physician Assistant | Admitting: Physician Assistant

## 2023-09-16 ENCOUNTER — Inpatient Hospital Stay: Payer: Medicare Other

## 2023-09-16 VITALS — BP 164/107 | HR 74 | Temp 98.4°F | Resp 14 | Wt 181.1 lb

## 2023-09-16 DIAGNOSIS — I129 Hypertensive chronic kidney disease with stage 1 through stage 4 chronic kidney disease, or unspecified chronic kidney disease: Secondary | ICD-10-CM | POA: Diagnosis not present

## 2023-09-16 DIAGNOSIS — D75839 Thrombocytosis, unspecified: Secondary | ICD-10-CM

## 2023-09-16 DIAGNOSIS — N189 Chronic kidney disease, unspecified: Secondary | ICD-10-CM | POA: Diagnosis not present

## 2023-09-16 DIAGNOSIS — Z79899 Other long term (current) drug therapy: Secondary | ICD-10-CM | POA: Insufficient documentation

## 2023-09-16 LAB — CBC WITH DIFFERENTIAL (CANCER CENTER ONLY)
Abs Immature Granulocytes: 0.03 10*3/uL (ref 0.00–0.07)
Basophils Absolute: 0.1 10*3/uL (ref 0.0–0.1)
Basophils Relative: 1 %
Eosinophils Absolute: 0.1 10*3/uL (ref 0.0–0.5)
Eosinophils Relative: 2 %
HCT: 44.3 % (ref 36.0–46.0)
Hemoglobin: 14.4 g/dL (ref 12.0–15.0)
Immature Granulocytes: 0 %
Lymphocytes Relative: 29 %
Lymphs Abs: 2.7 10*3/uL (ref 0.7–4.0)
MCH: 28.9 pg (ref 26.0–34.0)
MCHC: 32.5 g/dL (ref 30.0–36.0)
MCV: 88.8 fL (ref 80.0–100.0)
Monocytes Absolute: 0.6 10*3/uL (ref 0.1–1.0)
Monocytes Relative: 6 %
Neutro Abs: 5.9 10*3/uL (ref 1.7–7.7)
Neutrophils Relative %: 62 %
Platelet Count: 506 10*3/uL — ABNORMAL HIGH (ref 150–400)
RBC: 4.99 MIL/uL (ref 3.87–5.11)
RDW: 12.7 % (ref 11.5–15.5)
WBC Count: 9.5 10*3/uL (ref 4.0–10.5)
nRBC: 0 % (ref 0.0–0.2)

## 2023-09-16 LAB — CMP (CANCER CENTER ONLY)
ALT: 12 U/L (ref 0–44)
AST: 19 U/L (ref 15–41)
Albumin: 4.5 g/dL (ref 3.5–5.0)
Alkaline Phosphatase: 72 U/L (ref 38–126)
Anion gap: 8 (ref 5–15)
BUN: 23 mg/dL (ref 8–23)
CO2: 24 mmol/L (ref 22–32)
Calcium: 9.9 mg/dL (ref 8.9–10.3)
Chloride: 106 mmol/L (ref 98–111)
Creatinine: 1.2 mg/dL — ABNORMAL HIGH (ref 0.44–1.00)
GFR, Estimated: 48 mL/min — ABNORMAL LOW (ref >60)
Glucose, Bld: 121 mg/dL — ABNORMAL HIGH (ref 70–99)
Potassium: 4.7 mmol/L (ref 3.5–5.1)
Sodium: 138 mmol/L (ref 135–145)
Total Bilirubin: 0.4 mg/dL (ref 0.3–1.2)
Total Protein: 7.5 g/dL (ref 6.5–8.1)

## 2023-09-16 LAB — LACTATE DEHYDROGENASE: LDH: 118 U/L (ref 98–192)

## 2023-09-24 LAB — JAK2 (INCLUDING V617F AND EXON 12), MPL,& CALR W/RFL MPN PANEL (NGS)

## 2023-10-13 DIAGNOSIS — C44321 Squamous cell carcinoma of skin of nose: Secondary | ICD-10-CM | POA: Diagnosis not present

## 2023-10-13 DIAGNOSIS — M950 Acquired deformity of nose: Secondary | ICD-10-CM | POA: Diagnosis not present

## 2023-10-13 NOTE — Progress Notes (Signed)
[facility] OFFICE PROGRESS NOTE  Tower, Audrie Gallus, MD [address] [place] [address]  DIAGNOSIS: Thrombocytosis, mutation testing showed DDX41 which can put her at risk for leukemia or MDS in the future  PRIOR THERAPY: None  CURRENT THERAPY: Observation  INTERVAL HISTORY: Ricca B Meara 72 y.o. female returns to the clinic today for a follow up visit. The patient was seen in the clinic to establish care about 1 month ago. She was referred for thrombocytosis which had been occurring since at least 2008 (earliest lab records). Therefore, we recommended Jak 2 mutation testing to assess for myeloproliferative disorder. At her last appointment, she was endorsing feeling well except for hot flashes.   worsening hot flashes and memory concerns. She reports experiencing hot flashes multiple times a week, which have been increasing in frequency. These episodes are characterized by intense sweating, even soaking her hair, and are often accompanied by chills. The patient also experiences night sweats, waking up drenched. She has been off hormone replacement therapy for several years due to a kidney stone complication and subsequent loss of one kidney.  The patient also reports a history of occasional nosebleeds, described as blood-tinged, originating from the inside of the nostril. She denies any recent infections, unusual lymph node enlargement, fever, nausea, vomiting, abdominal pain, or fullness. She also denies any abnormal bleeding or bruising, except for the occasional nosebleeds.  The patient is currently on aspirin, fenofibrate, Synthroid, Singulair, propranolol, and a headache medication. She has a history of thyroid problems, requiring her to take her thyroid medication in the morning and other medications in the afternoon. She has the ability to monitor her blood pressure at home, which she reports is usually controlled.  Blood pressure is high today but she was tearful  and nervous at that time.  She states that she checks her blood pressure closely at home and her blood pressure is typically well-controlled.   She is up to date on her routine health screenings.  She is expected to have a DEXA scan tomorrow.  She reports that she had a mammogram in May.  Her last colonoscopy was approximately 3 years ago when she was given 10 years. She denies recent steroids or hormone use.  She denies history of DVT, MI, PE, or CVA. She is here to review her molecular studies.   MEDICAL HISTORY: Past Medical History:  Diagnosis Date   Allergic rhinitis    Allergy    Anemia    b12 def.pt. unaware   Asthma    controlled w/ singulair   Herpes simplex without mention of complication    controlled w/ meds (no current fever blisters)   History of hiatal hernia    History of kidney stones    HLD (hyperlipidemia)    borderline-  no meds   HTN (hypertension)    echo- 2006   Hyperthyroidism hx ptu 2006   takes synthroid; had Graves disease, was treated for that- now thyroid doesn't work   Leukocytosis, unspecified hx of 10 yrs ago   no problem since   Migraine    Mixed incontinence    urge and stress incontinence   Renal cyst right    pt is unsure of this, has had kidney stones   Ureteral calculi right    s/p laser  litho w/ stone extraction 01-28-11    ALLERGIES:  is allergic to contrast media [iodinated contrast media], shellfish-derived products, and septra [sulfamethoxazole-trimethoprim].  MEDICATIONS:  Current Outpatient Medications  Medication Sig Dispense Refill   acetaminophen (TYLENOL) 500 MG tablet Take 1,000 mg by mouth every 6 (six) hours as needed for moderate pain.     amLODipine (NORVASC) 10 MG tablet TAKE 1 TABLET BY MOUTH EVERY DAY 90 tablet 2   Calcium Carb-Cholecalciferol (CALCIUM 500/D PO) Take 1 tablet by mouth daily.     Cyanocobalamin (B-12 PO) Take 1 capsule by mouth daily.     fenofibrate (TRICOR) 145 MG tablet Take 1 tablet (145 mg  total) by mouth daily. 90 tablet 3   levothyroxine (SYNTHROID) 88 MCG tablet Take 1 tablet (88 mcg total) by mouth daily. 90 tablet 3   mirabegron ER (MYRBETRIQ) 50 MG TB24 tablet TAKE 1 TABLET BY MOUTH DAILY 90 tablet 0   montelukast (SINGULAIR) 10 MG tablet TAKE 1 TABLET BY MOUTH DAILY 90 tablet 0   Multiple Vitamin (MULTIVITAMIN) tablet Take 1 tablet by mouth daily.     propranolol ER (INDERAL LA) 120 MG 24 hr capsule TAKE 1 CAPSULE BY MOUTH DAILY 90 capsule 0   SUMAtriptan (IMITREX) 100 MG tablet TAKE 1 TABLET FOR HEADACHE MAY REPEAT ONCE IN 2 HOURS IF NEEDED. MAXIMUM OF 2    TABLETS IN ONE DAY. (Patient taking differently: Take 100 mg by mouth as needed for migraine or headache. TAKE 100 mg TABLET FOR HEADACHE MAY REPEAT ONCE IN 2 HOURS IF NEEDED. MAXIMUM OF 200 mg    TABLETS IN ONE DAY.) 27 tablet 3   No current facility-administered medications for this visit.    SURGICAL HISTORY:  Past Surgical History:  Procedure Laterality Date   BIOPSY  10/16/2018   Procedure: BIOPSY;  Surgeon: Rachael Fee, MD;  Location: [facility] ENDOSCOPY;  Service: Endoscopy;;   BREAST REDUCTION SURGERY  1994   CERVICAL FUSION  2000   fusion c4-6   CERVICAL FUSION  2005   fusion c6-7 and plate removal Z6-1   COLONOSCOPY     CYSTOSCOPY W/ RETROGRADES  10/21/2011   Procedure: CYSTOSCOPY WITH RETROGRADE PYELOGRAM;  Surgeon: Kathi Ludwig, MD;  Location: [facility];  Service: Urology;  Laterality: Right;  CYSTOSCOPY RIGHT RETROGRADE, PYELOGRAM  URETEROSCOPY WITH HOLMIUM LASER AND STONE EXTRACTION   CYSTOSCOPY/RETROGRADE/URETEROSCOPY/STONE EXTRACTION WITH BASKET  01/28/2011   right    CYSTOSCOPY/URETEROSCOPY/HOLMIUM LASER/STENT PLACEMENT Right 11/16/2021   Procedure: CYSTOSCOPY/RETROGRADE/URETEROSCOPY/HOLMIUM LASER/STENT PLACEMENT;  Surgeon: Jannifer Hick, MD;  Location: [facility];  Service: Urology;  Laterality: Right;   ESOPHAGOGASTRODUODENOSCOPY (EGD) WITH PROPOFOL N/A 10/16/2018    Procedure: ESOPHAGOGASTRODUODENOSCOPY (EGD) WITH PROPOFOL;  Surgeon: Rachael Fee, MD;  Location: [facility] ENDOSCOPY;  Service: Endoscopy;  Laterality: N/A;   MOHS SURGERY  04/2023   REDUCTION MAMMAPLASTY Bilateral 1995   TONSILLECTOMY  1960   VAGINAL HYSTERECTOMY  1987   fibroids/anemia    REVIEW OF SYSTEMS:   Review of Systems  Constitutional: Positive for fatigue, hot flashes, and generalized weakness.  Negative for appetite change, chills, fever and unexpected weight change.  HENT:   Negative for mouth sores, nosebleeds, sore throat and trouble swallowing.   Eyes: Negative for eye problems and icterus.  Respiratory: Negative for cough, hemoptysis, shortness of breath and wheezing.   Cardiovascular: Negative for chest pain and leg swelling.  Gastrointestinal: Negative for abdominal pain, constipation, diarrhea, nausea and vomiting.  Genitourinary: Negative for bladder incontinence, difficulty urinating, dysuria, frequency and hematuria.   Musculoskeletal: Negative for back pain, gait problem, neck pain and neck stiffness.  Skin: Negative for itching and rash.  Neurological: Negative for dizziness,  extremity weakness, gait problem, headaches, light-headedness and seizures.  Hematological: Negative for adenopathy. Does not bruise/bleed easily.  Psychiatric/Behavioral: Positive for anxious mood. Positive for memory concerns. negative for confusion, depression and sleep disturbance.    PHYSICAL EXAMINATION:  There were no vitals taken for this visit.  ECOG PERFORMANCE STATUS: 1  Physical Exam  Constitutional: Oriented to person, place, and time and well-developed, well-nourished, and in no distress. HENT:  Head: Normocephalic and atraumatic.  Mouth/Throat: Oropharynx is clear and moist. No oropharyngeal exudate.  Eyes: Conjunctivae are normal. Right eye exhibits no discharge. Left eye exhibits no discharge. No scleral icterus.  Neck: positive for hot flash and damp hair/neck. Normal  range of motion. Neck supple.  Cardiovascular: Normal rate, regular rhythm, normal heart sounds and intact distal pulses.   Pulmonary/Chest: Effort normal and breath sounds normal. No respiratory distress. No wheezes. No rales.  Abdominal: Soft. Bowel sounds are normal. Exhibits no distension and no mass. There is no tenderness.  Musculoskeletal: Normal range of motion. Exhibits no edema.  Lymphadenopathy:    No cervical adenopathy.  Neurological: Alert and oriented to person, place, and time. Exhibits normal muscle tone. Gait normal. Coordination normal.  Skin: Skin is warm and dry. No rash noted. Not diaphoretic. No erythema. No pallor.  Psychiatric: Mood, memory and judgment normal.  Vitals reviewed.  LABORATORY DATA: Lab Results  Component Value Date   WBC 9.5 09/16/2023   HGB 14.4 09/16/2023   HCT 44.3 09/16/2023   MCV 88.8 09/16/2023   PLT 506 (H) 09/16/2023      Chemistry      Component Value Date/Time   NA 138 09/16/2023 1338   K 4.7 09/16/2023 1338   CL 106 09/16/2023 1338   CO2 24 09/16/2023 1338   BUN 23 09/16/2023 1338   CREATININE 1.20 (H) 09/16/2023 1338   CREATININE 1.89 (H) 10/23/2018 1626      Component Value Date/Time   CALCIUM 9.9 09/16/2023 1338   ALKPHOS 72 09/16/2023 1338   AST 19 09/16/2023 1338   ALT 12 09/16/2023 1338   BILITOT 0.4 09/16/2023 1338       RADIOGRAPHIC STUDIES:  No results found.   ASSESSMENT/PLAN:  This is a very pleasant 71 year old female referred to the clinic for thrombocytosis She had molecular studies that showed DDX 41 has been associated with leukemia and MDS.   The patient was seen with Dr. Arbutus Ped. Labs were reviewed. Dr. Arbutus Ped discussed that this mutation is typically associated with leukemia/MDS and he does not feel that this is contributing to her thrombocytosis.  He would not recommend starting her on any medication such as Hydrea for her elevated platelet count at this time unless her platelet count continues  to elevate in the future.  However given that she is at high risk for leukemia or MDS, which she does not have any evidence of at this time, Arbutus Ped does recommend close surveillance and follow-up with the patient.  Dr. Laverle Patter does not feel that her night sweats and hot flashes are related to her platelet count.  Her CBC is otherwise normal with slightly elevated platelet count today.  We will call her PCPs office as she needs a refill of her fenofibrate.  We also strongly encouraged her to follow-up with her PCP about nonhormonal options regarding her hot flashes.   She will continue taking a baby aspirin.    The patient was advised to call immediately if she has any concerning symptoms in the interval. The patient voices understanding  of current disease status and treatment options and is in agreement with the current care plan. All questions were answered. The patient knows to call the clinic with any problems, questions or concerns. We can certainly see the patient much sooner if necessary    No orders of the defined types were placed in this encounter.    Kauri Garson L Athea Haley, PA-C 10/13/23  ADDENDUM: Hematology/Oncology Attending: I had a face-to-face encounter with the patient today.  I reviewed her records, lab and recommended her care plan.  This is a very pleasant 72 years old white female with history of mild thrombocytosis of unclear etiology with possibility of underlying myeloproliferative disorder.  The patient had JAK2 mutation panel performed recently that showed DDX41 of unknown clinical significance but can put her at risk for leukemia or MDS in the future. She had repeat CBC today that showed platelet count of 480,000.  This is lower than what we had seen in the past. I discussed the lab results with the patient and her husband and recommended for her to continue on observation with repeat blood work in 6 months.  She was advised to continue on baby aspirin. The  patient was advised to call immediately if she has any other concerning symptoms in the interval. The total time spent in the appointment was 20 minutes. Disclaimer: This note was dictated with voice recognition software. Similar sounding words can inadvertently be transcribed and may be missed upon review. Lajuana Matte, MD

## 2023-10-14 ENCOUNTER — Encounter: Payer: Self-pay | Admitting: Family Medicine

## 2023-10-15 ENCOUNTER — Other Ambulatory Visit: Payer: Self-pay | Admitting: Physician Assistant

## 2023-10-15 DIAGNOSIS — D75839 Thrombocytosis, unspecified: Secondary | ICD-10-CM

## 2023-10-16 ENCOUNTER — Inpatient Hospital Stay: Payer: Medicare Other | Attending: Physician Assistant

## 2023-10-16 ENCOUNTER — Inpatient Hospital Stay: Payer: Medicare Other | Admitting: Physician Assistant

## 2023-10-16 ENCOUNTER — Telehealth: Payer: Self-pay | Admitting: Family Medicine

## 2023-10-16 VITALS — BP 157/105 | HR 89 | Temp 98.4°F | Resp 17 | Wt 180.6 lb

## 2023-10-16 DIAGNOSIS — D75839 Thrombocytosis, unspecified: Secondary | ICD-10-CM | POA: Insufficient documentation

## 2023-10-16 DIAGNOSIS — Z79899 Other long term (current) drug therapy: Secondary | ICD-10-CM | POA: Diagnosis not present

## 2023-10-16 DIAGNOSIS — Z7982 Long term (current) use of aspirin: Secondary | ICD-10-CM | POA: Diagnosis not present

## 2023-10-16 LAB — CBC WITH DIFFERENTIAL (CANCER CENTER ONLY)
Abs Immature Granulocytes: 0.02 10*3/uL (ref 0.00–0.07)
Basophils Absolute: 0.1 10*3/uL (ref 0.0–0.1)
Basophils Relative: 1 %
Eosinophils Absolute: 0.3 10*3/uL (ref 0.0–0.5)
Eosinophils Relative: 3 %
HCT: 44 % (ref 36.0–46.0)
Hemoglobin: 14.3 g/dL (ref 12.0–15.0)
Immature Granulocytes: 0 %
Lymphocytes Relative: 25 %
Lymphs Abs: 2.2 10*3/uL (ref 0.7–4.0)
MCH: 28.9 pg (ref 26.0–34.0)
MCHC: 32.5 g/dL (ref 30.0–36.0)
MCV: 88.9 fL (ref 80.0–100.0)
Monocytes Absolute: 0.5 10*3/uL (ref 0.1–1.0)
Monocytes Relative: 6 %
Neutro Abs: 5.6 10*3/uL (ref 1.7–7.7)
Neutrophils Relative %: 65 %
Platelet Count: 480 10*3/uL — ABNORMAL HIGH (ref 150–400)
RBC: 4.95 MIL/uL (ref 3.87–5.11)
RDW: 12.7 % (ref 11.5–15.5)
WBC Count: 8.7 10*3/uL (ref 4.0–10.5)
nRBC: 0 % (ref 0.0–0.2)

## 2023-10-16 LAB — CMP (CANCER CENTER ONLY)
ALT: 11 U/L (ref 0–44)
AST: 16 U/L (ref 15–41)
Albumin: 4.3 g/dL (ref 3.5–5.0)
Alkaline Phosphatase: 62 U/L (ref 38–126)
Anion gap: 9 (ref 5–15)
BUN: 19 mg/dL (ref 8–23)
CO2: 22 mmol/L (ref 22–32)
Calcium: 9.4 mg/dL (ref 8.9–10.3)
Chloride: 108 mmol/L (ref 98–111)
Creatinine: 1.23 mg/dL — ABNORMAL HIGH (ref 0.44–1.00)
GFR, Estimated: 47 mL/min — ABNORMAL LOW (ref >60)
Glucose, Bld: 118 mg/dL — ABNORMAL HIGH (ref 70–99)
Potassium: 4.1 mmol/L (ref 3.5–5.1)
Sodium: 139 mmol/L (ref 135–145)
Total Bilirubin: 0.5 mg/dL (ref <1.2)
Total Protein: 7.2 g/dL (ref 6.5–8.1)

## 2023-10-16 MED ORDER — FENOFIBRATE 145 MG PO TABS: 145.0000 mg | ORAL_TABLET | Freq: Every day | ORAL | 2 refills | Status: DC

## 2023-10-16 NOTE — Telephone Encounter (Signed)
Message- mail order has fenofibrate on back order Sent to [facility]

## 2023-10-17 ENCOUNTER — Ambulatory Visit
Admission: RE | Admit: 2023-10-17 | Discharge: 2023-10-17 | Disposition: A | Discharge status: Home / Self Care | Payer: Medicare Other | Source: Ambulatory Visit | Attending: Family Medicine | Admitting: Family Medicine

## 2023-10-17 ENCOUNTER — Encounter: Payer: Self-pay | Admitting: Family Medicine

## 2023-10-17 ENCOUNTER — Ambulatory Visit (INDEPENDENT_AMBULATORY_CARE_PROVIDER_SITE_OTHER): Payer: Medicare Other | Admitting: Family Medicine

## 2023-10-17 VITALS — BP 151/88 | HR 90 | Temp 98.5°F | Ht 65.5 in

## 2023-10-17 DIAGNOSIS — R232 Flushing: Secondary | ICD-10-CM | POA: Diagnosis not present

## 2023-10-17 DIAGNOSIS — E0500 Thyrotoxicosis with diffuse goiter without thyrotoxic crisis or storm: Secondary | ICD-10-CM | POA: Diagnosis not present

## 2023-10-17 DIAGNOSIS — M8588 Other specified disorders of bone density and structure, other site: Secondary | ICD-10-CM | POA: Diagnosis not present

## 2023-10-17 DIAGNOSIS — D75839 Thrombocytosis, unspecified: Secondary | ICD-10-CM

## 2023-10-17 DIAGNOSIS — I10 Essential (primary) hypertension: Secondary | ICD-10-CM

## 2023-10-17 DIAGNOSIS — R61 Generalized hyperhidrosis: Secondary | ICD-10-CM | POA: Diagnosis not present

## 2023-10-17 DIAGNOSIS — N1832 Chronic kidney disease, stage 3b: Secondary | ICD-10-CM

## 2023-10-17 DIAGNOSIS — R29890 Loss of height: Secondary | ICD-10-CM | POA: Diagnosis not present

## 2023-10-17 DIAGNOSIS — R7303 Prediabetes: Secondary | ICD-10-CM

## 2023-10-17 DIAGNOSIS — E2839 Other primary ovarian failure: Secondary | ICD-10-CM

## 2023-10-17 LAB — BASIC METABOLIC PANEL
BUN: 23 mg/dL (ref 6–23)
CO2: 23 meq/L (ref 19–32)
Calcium: 9.7 mg/dL (ref 8.4–10.5)
Chloride: 106 meq/L (ref 96–112)
Creatinine, Ser: 1.21 mg/dL — ABNORMAL HIGH (ref 0.40–1.20)
GFR: 44.65 mL/min — ABNORMAL LOW (ref >60.00)
Glucose, Bld: 129 mg/dL — ABNORMAL HIGH (ref 70–99)
Potassium: 4.2 meq/L (ref 3.5–5.1)
Sodium: 140 meq/L (ref 135–145)

## 2023-10-17 LAB — T3, FREE: T3, Free: 3.4 pg/mL (ref 2.3–4.2)

## 2023-10-17 LAB — T4, FREE: Free T4: 1.2 ng/dL (ref 0.60–1.60)

## 2023-10-17 LAB — TSH: TSH: 4.94 u[IU]/mL (ref 0.35–5.50)

## 2023-10-17 MED ORDER — GABAPENTIN 100 MG PO CAPS: 200.0000 mg | ORAL_CAPSULE | Freq: Two times a day (BID) | ORAL | 1 refills | Status: DC

## 2023-10-17 NOTE — Progress Notes (Unsigned)
Subjective:    Patient ID: [name], female    DOB: 1951-01-17, 72 y.o.   MRN: [id number]  HPI  Wt Readings from Last 3 Encounters:  10/16/23 180 lb 9.6 oz (81.9 kg)  09/16/23 181 lb 1.6 oz (82.1 kg)  08/19/23 184 lb (83.5 kg)   29.60 kg/m  Vitals:   10/17/23 0926 10/17/23 0927  BP: (!) 151/88   Pulse:  90  Temp:    SpO2:     Appetite is low    Pt presents for c/o hot flashes   Sweats are getting worse and worse  All day  Most of the night  Soaking sweats   Does not feel comfortable walking  Looses balance easily  Sleeps all the time   Short term memory is not as good   Puts ice packs on back of neck   Body is hot and feet are cold   No new stress except this    Cannot have HRT  This has all been since stopping hormones   Mother had life long hot flashes  She got leukemia at end of life    Thyroid history   Lab Results  Component Value Date   TSH 4.94 10/17/2023   Levothyroxine 88 mcg daily    Past Dr [name] was last endocrinologist    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 at risk of MDS in future  Sees hematology Lab Results  Component Value Date   WBC 8.7 10/16/2023   HGB 14.3 10/16/2023   HCT 44.0 10/16/2023   MCV 88.9 10/16/2023   PLT 480 (H) 10/16/2023   Lab Results  Component Value Date   NA 140 10/17/2023   K 4.2 10/17/2023   CO2 23 10/17/2023   GLUCOSE 129 (H) 10/17/2023   BUN 23 10/17/2023   CREATININE 1.21 (H) 10/17/2023   CALCIUM 9.7 10/17/2023   GFR 44.65 (L) 10/17/2023   GFRNONAA 47 (L) 10/16/2023  Taking asa every day   Renal stone caused her to loose one kidney    HTN bp is up in setting of very anxious mood today No cp or palpitations or headaches or edema  No side effects to medicines  BP Readings from Last 3 Encounters:  10/17/23 (!) 151/88  10/16/23 (!) 157/105  09/16/23 (!) 164/107    Amlodiine 10 mg daily  Propranolol ER 120 mg daily  Lab Results  Component Value Date    CHOL 189 09/03/2023   HDL 67.30 09/03/2023   LDLCALC 68 09/03/2023   LDLDIRECT 88.0 06/16/2023   TRIG 270.0 (H) 09/03/2023   CHOLHDL 3 09/03/2023     Patient Active Problem List   Diagnosis Date Noted   Hot flashes 10/17/2023   Graves disease 10/17/2023   Night sweats 08/21/2023   Stage 3b chronic kidney disease (HCC) 05/12/2023   Hemorrhoids 10/23/2018   Renal insufficiency 10/23/2018   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    Estrogen deficiency 04/15/2018   Bladder prolapse, female, 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   Dyspepsia 04/14/2017   Colon cancer screening 04/09/2017   Prediabetes 03/30/2017   Medicare annual wellness visit, initial 04/04/2016   Other screening mammogram 12/31/2011   Post-menopausal 12/31/2011   Routine general medical examination at a health care facility 12/23/2011   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   Hypothyroidism 11/08/2008   HSV (herpes simplex virus) infection 09/23/2007   Hypertriglyceridemia 09/23/2007   Essential hypertension 09/23/2007   Allergic rhinitis 09/23/2007   Asthma 09/23/2007  HYPOKALEMIA 04/16/2007   Thrombocytosis (HCC) 04/09/2007   Past Medical History:  Diagnosis Date   Allergic rhinitis    Allergy    Anemia    b12 def.pt. unaware   Asthma    controlled w/ singulair   Herpes simplex without mention of complication    controlled w/ meds (no current fever blisters)   History of hiatal hernia    History of kidney stones    HLD (hyperlipidemia)    borderline-  no meds   HTN (hypertension)    echo- 2006   Hyperthyroidism hx ptu 2006   takes synthroid; had Graves disease, was treated for that- now thyroid doesn't work   Leukocytosis, unspecified hx of 10 yrs ago   no problem since   Migraine    Mixed incontinence    urge and stress incontinence   Renal cyst right    pt is unsure of this, has had kidney stones   Ureteral calculi right    s/p laser  litho w/ stone extraction 01-28-11   Past Surgical History:   Procedure Laterality Date   BIOPSY  10/16/2018   Procedure: BIOPSY;  Surgeon: Rachael Fee, MD;  Location: [facility]COPY;  Service: Endoscopy;;   BREAST REDUCTION SURGERY  1994   CERVICAL FUSION  2000   fusion c4-6   CERVICAL FUSION  2005   fusion c6-7 and plate removal Z6-1   COLONOSCOPY     CYSTOSCOPY W/ RETROGRADES  10/21/2011   Procedure: CYSTOSCOPY WITH RETROGRADE PYELOGRAM;  Surgeon: Kathi Ludwig, MD;  Location: [facility];  Service: Urology;  Laterality: Right;  CYSTOSCOPY RIGHT RETROGRADE, PYELOGRAM  URETEROSCOPY WITH HOLMIUM LASER AND STONE EXTRACTION   CYSTOSCOPY/RETROGRADE/URETEROSCOPY/STONE EXTRACTION WITH BASKET  01/28/2011   right    CYSTOSCOPY/URETEROSCOPY/HOLMIUM LASER/STENT PLACEMENT Right 11/16/2021   Procedure: CYSTOSCOPY/RETROGRADE/URETEROSCOPY/HOLMIUM LASER/STENT PLACEMENT;  Surgeon: Jannifer Hick, MD;  Location: [facility];  Service: Urology;  Laterality: Right;   ESOPHAGOGASTRODUODENOSCOPY (EGD) WITH PROPOFOL N/A 10/16/2018   Procedure: ESOPHAGOGASTRODUODENOSCOPY (EGD) WITH PROPOFOL;  Surgeon: Rachael Fee, MD;  Location: [facility] ENDOSCOPY;  Service: Endoscopy;  Laterality: N/A;   MOHS SURGERY  04/2023   REDUCTION MAMMAPLASTY Bilateral 1995   TONSILLECTOMY  1960   VAGINAL HYSTERECTOMY  1987   fibroids/anemia   Social History   Tobacco Use   Smoking status: Never   Smokeless tobacco: Never  Vaping Use   Vaping status: Never Used  Substance Use Topics   Alcohol use: No    Alcohol/week: 0.0 standard drinks of alcohol   Drug use: No   Family History  Problem Relation Age of Onset   Osteoporosis Mother    Diabetes Mother    Heart disease Father    Heart failure Father    Diabetes Other        Grandfather   Coronary artery disease Other        Grandmother   Breast cancer Neg Hx    Colon cancer Neg Hx    Esophageal cancer Neg Hx    Stomach cancer Neg Hx    Rectal cancer Neg Hx    Thyroid disease Neg Hx    Allergies   Allergen Reactions   Contrast Media [Iodinated Contrast Media] Anaphylaxis   Shellfish-Derived Products Anaphylaxis   Septra [Sulfamethoxazole-Trimethoprim] Nausea Only   Current Outpatient Medications on File Prior to Visit  Medication Sig Dispense Refill   acetaminophen (TYLENOL) 500 MG tablet Take 1,000 mg by mouth every 6 (six) hours as needed for moderate pain.  amLODipine (NORVASC) 10 MG tablet TAKE 1 TABLET BY MOUTH EVERY DAY 90 tablet 2   Calcium Carb-Cholecalciferol (CALCIUM 500/D PO) Take 1 tablet by mouth daily.     Cyanocobalamin (B-12 PO) Take 1 capsule by mouth daily.     fenofibrate (TRICOR) 145 MG tablet Take 1 tablet (145 mg total) by mouth daily. 90 tablet 2   levothyroxine (SYNTHROID) 88 MCG tablet Take 1 tablet (88 mcg total) by mouth daily. 90 tablet 3   mirabegron ER (MYRBETRIQ) 50 MG TB24 tablet TAKE 1 TABLET BY MOUTH DAILY 90 tablet 0   montelukast (SINGULAIR) 10 MG tablet TAKE 1 TABLET BY MOUTH DAILY 90 tablet 0   Multiple Vitamin (MULTIVITAMIN) tablet Take 1 tablet by mouth daily.     propranolol ER (INDERAL LA) 120 MG 24 hr capsule TAKE 1 CAPSULE BY MOUTH DAILY 90 capsule 0   SUMAtriptan (IMITREX) 100 MG tablet TAKE 1 TABLET FOR HEADACHE MAY REPEAT ONCE IN 2 HOURS IF NEEDED. MAXIMUM OF 2    TABLETS IN ONE DAY. (Patient taking differently: Take 100 mg by mouth as needed for migraine or headache. TAKE 100 mg TABLET FOR HEADACHE MAY REPEAT ONCE IN 2 HOURS IF NEEDED. MAXIMUM OF 200 mg    TABLETS IN ONE DAY.) 27 tablet 3   No current facility-administered medications on file prior to visit.    Review of Systems  Constitutional:  Positive for diaphoresis and fatigue. Negative for activity change, appetite change, fever and unexpected weight change.       Sweats without fever   HENT:  Negative for congestion, ear pain, rhinorrhea, sinus pressure and sore throat.   Eyes:  Negative for pain, redness and visual disturbance.  Respiratory:  Negative for cough, shortness  of breath and wheezing.   Cardiovascular:  Negative for chest pain and palpitations.  Gastrointestinal:  Negative for abdominal pain, blood in stool, constipation and diarrhea.  Endocrine: Positive for heat intolerance. Negative for cold intolerance, polydipsia, polyphagia and polyuria.  Genitourinary:  Negative for dysuria, frequency and urgency.  Musculoskeletal:  Negative for arthralgias, back pain and myalgias.  Skin:  Negative for pallor and rash.  Allergic/Immunologic: Negative for environmental allergies.  Neurological:  Negative for dizziness, syncope and headaches.  Hematological:  Negative for adenopathy. Does not bruise/bleed easily.  Psychiatric/Behavioral:  Positive for dysphoric mood and sleep disturbance. Negative for decreased concentration. The patient is nervous/anxious.        Objective:   Physical Exam Constitutional:      General: She is not in acute distress.    Appearance: Normal appearance. She is well-developed. She is obese. She is diaphoretic. She is not ill-appearing.     Comments: Hair line is damp from sweats    HENT:     Head: Normocephalic and atraumatic.     Mouth/Throat:     Mouth: Mucous membranes are moist.     Pharynx: Oropharynx is clear. No posterior oropharyngeal erythema.  Eyes:     General: No scleral icterus.       Right eye: No discharge.        Left eye: No discharge.     Conjunctiva/sclera: Conjunctivae normal.     Pupils: Pupils are equal, round, and reactive to light.  Neck:     Thyroid: No thyromegaly.     Vascular: No carotid bruit or JVD.  Cardiovascular:     Rate and Rhythm: Regular rhythm. Tachycardia present.     Heart sounds: Normal heart sounds.  No gallop.  Pulmonary:     Effort: Pulmonary effort is normal. No respiratory distress.     Breath sounds: Normal breath sounds. No stridor. No wheezing, rhonchi or rales.  Chest:     Chest wall: No tenderness.  Abdominal:     General: There is no distension or abdominal  bruit.     Palpations: Abdomen is soft. There is no mass.  Musculoskeletal:     Cervical back: Normal range of motion and neck supple.     Right lower leg: No edema.     Left lower leg: No edema.  Lymphadenopathy:     Cervical: No cervical adenopathy.  Skin:    General: Skin is warm.     Coloration: Skin is not pale.     Findings: No rash.  Neurological:     Mental Status: She is alert.     Cranial Nerves: No cranial nerve deficit.     Coordination: Coordination normal.     Deep Tendon Reflexes: Reflexes are normal and symmetric. Reflexes normal.  Psychiatric:        Attention and Perception: Attention normal.        Mood and Affect: Mood is anxious and depressed. Affect is tearful.        Speech: Speech normal.        Behavior: Behavior normal.        Cognition and Memory: Cognition and memory normal.     Comments: Distressed over sweats and heat intolerance   Candidly discusses symptoms and stressors             Assessment & Plan:   Problem List Items Addressed This Visit       Cardiovascular and Mediastinum   Essential hypertension    BP: (!) 151/88  In setting of anxious mood  Will re check this on day when she feels better   Continue Proprnolol ER 120 mg daily  Malodipine 10 mg daily      Hot flashes - Primary    See a/p for night sweats  Suspect menopause hormone related  Re check thyroid labs today      Relevant Orders   TSH (Completed)   T4, free (Completed)   T3, Free (Completed)   Thyroid Peroxidase Antibodies (TPO) (REFL)-[facility]     Endocrine   Graves disease    Thyroid labs today   More sweats/anxiety but no weight loss or change in exam      Relevant Orders   TSH (Completed)   T4, free (Completed)   T3, Free (Completed)   Thyroid Peroxidase Antibodies (TPO) (REFL)-[facility]     Genitourinary   Stage 3b chronic kidney disease (HCC)    Renal stone caused her to loose one kidney  GFR recent 44.6 Sees nephrology      Relevant  Orders   Basic metabolic panel (Completed)     Hematopoietic and Hemostatic   Thrombocytosis (HCC)    Seen by hematology  Plt stable  In setting of mutation increase risk of MDS in future Will continue to follow         Other   Night sweats    Ongoing  With moodiness/anxiety and trouble concentrating  Family history of menopausal hot flashes (mother had for her whole life)   Thyroid labs today   Trial of gabapentin 100 mg bid to increase to 200 mg bid as tolerated and close follow up   Consider additional endocrin eval if needed  No signs and symptoms of  infection   Follow up 2-3 wk       Relevant Orders   TSH (Completed)   T4, free (Completed)   T3, Free (Completed)   Thyroid Peroxidase Antibodies (TPO) (REFL)-[facility]   Prediabetes    Lab Results  Component Value Date   HGBA1C 5.7 05/06/2023   disc imp of low glycemic diet and wt loss to prevent DM2

## 2023-10-17 NOTE — Patient Instructions (Addendum)
Gabapentin -take 100 mg twice daily for a week  If tolerating well go up to 200 mg twice daily   If side effects or problems hold it and  let me know   Follow up here in 2-3 weeks    Labs now for thryoid   Try and get protein in diet and stay hydrated

## 2023-10-19 ENCOUNTER — Encounter: Payer: Self-pay | Admitting: Family Medicine

## 2023-10-19 DIAGNOSIS — M8580 Other specified disorders of bone density and structure, unspecified site: Secondary | ICD-10-CM | POA: Insufficient documentation

## 2023-10-19 NOTE — Assessment & Plan Note (Signed)
BP: (!) 151/88  In setting of anxious mood  Will re check this on day when she feels better   Continue Proprnolol ER 120 mg daily  Malodipine 10 mg daily

## 2023-10-19 NOTE — Assessment & Plan Note (Signed)
Seen by hematology  Plt stable  In setting of mutation increase risk of MDS in future Will continue to follow

## 2023-10-19 NOTE — Assessment & Plan Note (Signed)
See a/p for night sweats  Suspect menopause hormone related  Re check thyroid labs today

## 2023-10-19 NOTE — Assessment & Plan Note (Signed)
Lab Results  Component Value Date   HGBA1C 5.7 05/06/2023   disc imp of low glycemic diet and wt loss to prevent DM2

## 2023-10-19 NOTE — Assessment & Plan Note (Addendum)
Ongoing  With moodiness/anxiety and trouble concentrating  Family history of menopausal hot flashes (mother had for her whole life)   Thyroid labs today   Trial of gabapentin 100 mg bid to increase to 200 mg bid as tolerated and close follow up   Consider additional endocrin eval if needed  No signs and symptoms of infection   Follow up 2-3 wk

## 2023-10-19 NOTE — Assessment & Plan Note (Signed)
Thyroid labs today   More sweats/anxiety but no weight loss or change in exam

## 2023-10-19 NOTE — Assessment & Plan Note (Signed)
Renal stone caused her to loose one kidney  GFR recent 44.6 Sees nephrology

## 2023-10-20 ENCOUNTER — Telehealth: Payer: Self-pay | Admitting: Family Medicine

## 2023-10-20 DIAGNOSIS — E781 Pure hyperglyceridemia: Secondary | ICD-10-CM

## 2023-10-20 LAB — THYROID PEROXIDASE ANTIBODIES (TPO) (REFL): Thyroperoxidase Ab SerPl-aCnc: <1 [IU]/mL (ref <9)

## 2023-10-20 NOTE — Telephone Encounter (Signed)
-----   Message from Alvina Chou sent at 10/10/2023 11:53 AM EDT ----- Regarding: Lab orders for Tue, 11.12.24 Lab orders, thanks

## 2023-10-21 ENCOUNTER — Encounter: Payer: Self-pay | Admitting: Family Medicine

## 2023-10-21 ENCOUNTER — Other Ambulatory Visit: Payer: Medicare Other

## 2023-10-31 ENCOUNTER — Ambulatory Visit (INDEPENDENT_AMBULATORY_CARE_PROVIDER_SITE_OTHER): Payer: Medicare Other | Admitting: Family Medicine

## 2023-10-31 ENCOUNTER — Encounter: Payer: Self-pay | Admitting: Family Medicine

## 2023-10-31 VITALS — BP 122/64 | HR 78 | Temp 98.1°F | Ht 65.0 in | Wt 184.2 lb

## 2023-10-31 DIAGNOSIS — I10 Essential (primary) hypertension: Secondary | ICD-10-CM | POA: Diagnosis not present

## 2023-10-31 DIAGNOSIS — R232 Flushing: Secondary | ICD-10-CM

## 2023-10-31 DIAGNOSIS — R0683 Snoring: Secondary | ICD-10-CM | POA: Diagnosis not present

## 2023-10-31 DIAGNOSIS — N1832 Chronic kidney disease, stage 3b: Secondary | ICD-10-CM | POA: Diagnosis not present

## 2023-10-31 DIAGNOSIS — R053 Chronic cough: Secondary | ICD-10-CM | POA: Diagnosis not present

## 2023-10-31 DIAGNOSIS — E039 Hypothyroidism, unspecified: Secondary | ICD-10-CM

## 2023-10-31 MED ORDER — GABAPENTIN 300 MG PO CAPS: 300.0000 mg | ORAL_CAPSULE | Freq: Two times a day (BID) | ORAL | 1 refills | Status: DC

## 2023-10-31 NOTE — Progress Notes (Unsigned)
Subjective:    Patient ID: [name], female    DOB: [birth year], 72 y.o.   MRN: [id number]  HPI  Wt Readings from Last 3 Encounters:  10/31/23 184 lb 4 oz (83.6 kg)  10/16/23 180 lb 9.6 oz (81.9 kg)  09/16/23 181 lb 1.6 oz (82.1 kg)   30.66 kg/m  Vitals:   10/31/23 0851  BP: 122/64  Pulse: 78  Temp: 98.1 F (36.7 C)  SpO2: 98%   Pt presents for 2 week follow up of hot flashes/sweats and other chronic problems  Still has hot flashes but not as often (day and night)  Probably 2 during the day  One at night   Does not feel loopy  Brain fog is still there    Not getting lost or confused  Has not become lost  Some word finding   Mood -in general is pretty good  Not anxious   Sleeps a lot  Has post covid cough that wakes her up  Ever since 2019  Still no smell or taste   Has always snored  Husband watches this   Able to be more active lately      Office Visit on 10/17/2023  Component Date Value Ref Range Status   TSH 10/17/2023 4.94  0.35 - 5.50 uIU/mL Final   Free T4 10/17/2023 1.20  0.60 - 1.60 ng/dL Final   Comment: Specimens from patients who are undergoing biotin therapy and /or ingesting biotin supplements may contain high levels of biotin.  The higher biotin concentration in these specimens interferes with this Free T4 assay.  Specimens that contain high levels  of biotin may cause false high results for this Free T4 assay.  Please interpret results in light of the total clinical presentation of the patient.     T3, Free 10/17/2023 3.4  2.3 - 4.2 pg/mL Final   Thyroperoxidase Ab SerPl-aCnc 10/17/2023 <1  <9 IU/mL Final   Sodium 10/17/2023 140  135 - 145 mEq/L Final   Potassium 10/17/2023 4.2  3.5 - 5.1 mEq/L Final   Chloride 10/17/2023 106  96 - 112 mEq/L Final   CO2 10/17/2023 23  19 - 32 mEq/L Final   Glucose, Bld 10/17/2023 129 (H)  70 - 99 mg/dL Final   BUN 57/84/6962 23  6 - 23 mg/dL Final   Creatinine, Ser 10/17/2023 1.21 (H)  0.40 -  1.20 mg/dL Final   GFR 95/28/4132 44.65 (L)  >60.00 mL/min Final   Calculated using the CKD-EPI Creatinine Equation (2021)   Calcium 10/17/2023 9.7  8.4 - 10.5 mg/dL Final        Patient Active Problem List   Diagnosis Date Noted   Snoring 10/31/2023   Chronic cough 10/31/2023   Osteopenia 10/19/2023   Hot flashes 10/17/2023   Graves disease 10/17/2023   Night sweats 08/21/2023   Stage 3b chronic kidney disease (HCC) 05/12/2023   Hemorrhoids 10/23/2018   Renal insufficiency 10/23/2018   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    Estrogen deficiency 04/15/2018   Bladder prolapse, female, 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   Dyspepsia 04/14/2017   Colon cancer screening 04/09/2017   Prediabetes 03/30/2017   Medicare annual wellness visit, initial 04/04/2016   Other screening mammogram 12/31/2011   Post-menopausal 12/31/2011   Routine general medical examination at a health care facility 12/23/2011   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   Hypothyroidism 11/08/2008   HSV (herpes simplex virus) infection 09/23/2007   Hypertriglyceridemia 09/23/2007   Essential hypertension 09/23/2007  Allergic rhinitis 09/23/2007   Asthma 09/23/2007   HYPOKALEMIA 04/16/2007   Thrombocytosis (HCC) 04/09/2007   Past Medical History:  Diagnosis Date   Allergic rhinitis    Allergy    Anemia    b12 def.pt. unaware   Asthma    controlled w/ singulair   Herpes simplex without mention of complication    controlled w/ meds (no current fever blisters)   History of hiatal hernia    History of kidney stones    HLD (hyperlipidemia)    borderline-  no meds   HTN (hypertension)    echo- 2006   Hyperthyroidism hx ptu 2006   takes synthroid; had Graves disease, was treated for that- now thyroid doesn't work   Leukocytosis, unspecified hx of 10 yrs ago   no problem since   Migraine    Mixed incontinence    urge and stress incontinence   Renal cyst right    pt is unsure of this, has had kidney stones    Ureteral calculi right    s/p laser  litho w/ stone extraction 01-28-11   Past Surgical History:  Procedure Laterality Date   BIOPSY  10/16/2018   Procedure: BIOPSY;  Surgeon: Rachael Fee, MD;  Location: [facility] ENDOSCOPY;  Service: Endoscopy;;   BREAST REDUCTION SURGERY  1994   CERVICAL FUSION  2000   fusion c4-6   CERVICAL FUSION  2005   fusion c6-7 and plate removal N8-2   COLONOSCOPY     CYSTOSCOPY W/ RETROGRADES  10/21/2011   Procedure: CYSTOSCOPY WITH RETROGRADE PYELOGRAM;  Surgeon: Kathi Ludwig, MD;  Location: [facility];  Service: Urology;  Laterality: Right;  CYSTOSCOPY RIGHT RETROGRADE, PYELOGRAM  URETEROSCOPY WITH HOLMIUM LASER AND STONE EXTRACTION   CYSTOSCOPY/RETROGRADE/URETEROSCOPY/STONE EXTRACTION WITH BASKET  01/28/2011   right    CYSTOSCOPY/URETEROSCOPY/HOLMIUM LASER/STENT PLACEMENT Right 11/16/2021   Procedure: CYSTOSCOPY/RETROGRADE/URETEROSCOPY/HOLMIUM LASER/STENT PLACEMENT;  Surgeon: Jannifer Hick, MD;  Location: [facility];  Service: Urology;  Laterality: Right;   ESOPHAGOGASTRODUODENOSCOPY (EGD) WITH PROPOFOL N/A 10/16/2018   Procedure: ESOPHAGOGASTRODUODENOSCOPY (EGD) WITH PROPOFOL;  Surgeon: Rachael Fee, MD;  Location: [facility] ENDOSCOPY;  Service: Endoscopy;  Laterality: N/A;   MOHS SURGERY  04/2023   REDUCTION MAMMAPLASTY Bilateral 1995   TONSILLECTOMY  1960   VAGINAL HYSTERECTOMY  1987   fibroids/anemia   Social History   Tobacco Use   Smoking status: Never   Smokeless tobacco: Never  Vaping Use   Vaping status: Never Used  Substance Use Topics   Alcohol use: No    Alcohol/week: 0.0 standard drinks of alcohol   Drug use: No   Family History  Problem Relation Age of Onset   Osteoporosis Mother    Diabetes Mother    Heart disease Father    Heart failure Father    Diabetes Other        Grandfather   Coronary artery disease Other        Grandmother   Breast cancer Neg Hx    Colon cancer Neg Hx    Esophageal cancer Neg Hx     Stomach cancer Neg Hx    Rectal cancer Neg Hx    Thyroid disease Neg Hx    Allergies  Allergen Reactions   Contrast Media [Iodinated Contrast Media] Anaphylaxis   Shellfish-Derived Products Anaphylaxis   Septra [Sulfamethoxazole-Trimethoprim] Nausea Only   Current Outpatient Medications on File Prior to Visit  Medication Sig Dispense Refill   acetaminophen (TYLENOL) 500 MG tablet Take 1,000 mg by mouth  every 6 (six) hours as needed for moderate pain.     amLODipine (NORVASC) 10 MG tablet TAKE 1 TABLET BY MOUTH EVERY DAY 90 tablet 2   Calcium Carb-Cholecalciferol (CALCIUM 500/D PO) Take 1 tablet by mouth daily.     Cyanocobalamin (B-12 PO) Take 1 capsule by mouth daily.     fenofibrate (TRICOR) 145 MG tablet Take 1 tablet (145 mg total) by mouth daily. 90 tablet 2   levothyroxine (SYNTHROID) 88 MCG tablet Take 1 tablet (88 mcg total) by mouth daily. 90 tablet 3   mirabegron ER (MYRBETRIQ) 50 MG TB24 tablet TAKE 1 TABLET BY MOUTH DAILY 90 tablet 0   montelukast (SINGULAIR) 10 MG tablet TAKE 1 TABLET BY MOUTH DAILY 90 tablet 0   Multiple Vitamin (MULTIVITAMIN) tablet Take 1 tablet by mouth daily.     propranolol ER (INDERAL LA) 120 MG 24 hr capsule TAKE 1 CAPSULE BY MOUTH DAILY 90 capsule 0   SUMAtriptan (IMITREX) 100 MG tablet TAKE 1 TABLET FOR HEADACHE MAY REPEAT ONCE IN 2 HOURS IF NEEDED. MAXIMUM OF 2    TABLETS IN ONE DAY. (Patient taking differently: Take 100 mg by mouth as needed for migraine or headache. TAKE 100 mg TABLET FOR HEADACHE MAY REPEAT ONCE IN 2 HOURS IF NEEDED. MAXIMUM OF 200 mg    TABLETS IN ONE DAY.) 27 tablet 3   No current facility-administered medications on file prior to visit.    Review of Systems  Constitutional:  Negative for activity change, appetite change, fatigue, fever and unexpected weight change.  HENT:  Negative for congestion, ear pain, rhinorrhea, sinus pressure and sore throat.   Eyes:  Negative for pain, redness and visual disturbance.   Respiratory:  Negative for cough, shortness of breath and wheezing.   Cardiovascular:  Negative for chest pain and palpitations.  Gastrointestinal:  Negative for abdominal pain, blood in stool, constipation and diarrhea.  Endocrine: Positive for heat intolerance. Negative for polydipsia and polyuria.       Hot flashes have improved   Genitourinary:  Negative for dysuria, frequency and urgency.  Musculoskeletal:  Negative for arthralgias, back pain and myalgias.  Skin:  Negative for pallor and rash.  Allergic/Immunologic: Negative for environmental allergies.  Neurological:  Negative for dizziness, syncope and headaches.  Hematological:  Negative for adenopathy. Does not bruise/bleed easily.  Psychiatric/Behavioral:  Negative for decreased concentration and dysphoric mood. The patient is not nervous/anxious.        Objective:   Physical Exam Constitutional:      General: She is not in acute distress.    Appearance: Normal appearance. She is well-developed. She is obese. She is not ill-appearing or diaphoretic.  HENT:     Head: Normocephalic and atraumatic.  Eyes:     Conjunctiva/sclera: Conjunctivae normal.     Pupils: Pupils are equal, round, and reactive to light.  Neck:     Thyroid: No thyromegaly.     Vascular: No carotid bruit or JVD.  Cardiovascular:     Rate and Rhythm: Normal rate and regular rhythm.     Heart sounds: Normal heart sounds.     No gallop.  Pulmonary:     Effort: Pulmonary effort is normal. No respiratory distress.     Breath sounds: Normal breath sounds. No wheezing or rales.  Abdominal:     General: There is no distension or abdominal bruit.     Palpations: Abdomen is soft.  Musculoskeletal:     Cervical back: Normal range of motion and  neck supple.     Right lower leg: No edema.     Left lower leg: No edema.  Lymphadenopathy:     Cervical: No cervical adenopathy.  Skin:    General: Skin is warm and dry.     Coloration: Skin is not pale.      Findings: No rash.  Neurological:     Mental Status: She is alert.     Coordination: Coordination normal.     Deep Tendon Reflexes: Reflexes are normal and symmetric. Reflexes normal.  Psychiatric:        Mood and Affect: Mood normal.     Comments: Pleasant  Feeling better   Not anxious at all            Assessment & Plan:   Problem List Items Addressed This Visit       Cardiovascular and Mediastinum   Hot flashes - Primary    Improving with gabapentin 200 mg bid  Down to 3 daily now  Pleased with this  Tolerating well Some brain fog  No sedation or loopy feeling   Plan to increase to 300 mg bid Discussed possible side effects/will update Follow up 2 months        Essential hypertension    BP: 122/64  Down to normal now without active hot flash and anxiety  Will re check this on day when she feels better   Continue Proprnolol ER 120 mg daily  Malodipine 10 mg daily        Endocrine   Hypothyroidism    Lab Results  Component Value Date   TSH 4.94 10/17/2023   Will contoinue levothyroxine 88 mcg daily        Genitourinary   Stage 3b chronic kidney disease (HCC)    Last GFR here was 44.6 Sees nephrology        Other   Snoring    With some fatigue  Need to consider eval for sleep apnea in the future if not improved  Husband watches for apnea       Chronic cough    Recommend plain antihistamine over the counter to help pnd  Avoid D products due to side effect

## 2023-10-31 NOTE — Patient Instructions (Addendum)
Go up to 300 mg of gabapentin twice daily  If any intolerable side effects let us know   I hope it improves cough as well as hot flashes   For cough  Stop tylenol sinus  Change to claritin or allegra or xyzal or zyrtec as directed (no D)   Follow up here in 2 months

## 2023-10-31 NOTE — Assessment & Plan Note (Signed)
BP: 122/64  Down to normal now without active hot flash and anxiety  Will re check this on day when she feels better   Continue Proprnolol ER 120 mg daily  Malodipine 10 mg daily

## 2023-10-31 NOTE — Assessment & Plan Note (Signed)
Improving with gabapentin 200 mg bid  Down to 3 daily now  Pleased with this  Tolerating well Some brain fog  No sedation or loopy feeling   Plan to increase to 300 mg bid Discussed possible side effects/will update Follow up 2 months

## 2023-11-02 NOTE — Assessment & Plan Note (Signed)
Last GFR here was 44.6 Sees nephrology

## 2023-11-02 NOTE — Assessment & Plan Note (Signed)
Recommend plain antihistamine over the counter to help pnd  Avoid D products due to side effect

## 2023-11-02 NOTE — Assessment & Plan Note (Signed)
Lab Results  Component Value Date   TSH 4.94 10/17/2023   Will contoinue levothyroxine 88 mcg daily

## 2023-11-02 NOTE — Assessment & Plan Note (Signed)
With some fatigue  Need to consider eval for sleep apnea in the future if not improved  Husband watches for apnea

## 2023-12-31 ENCOUNTER — Ambulatory Visit (INDEPENDENT_AMBULATORY_CARE_PROVIDER_SITE_OTHER): Payer: Medicare Other | Admitting: Family Medicine

## 2023-12-31 ENCOUNTER — Encounter: Payer: Self-pay | Admitting: Family Medicine

## 2023-12-31 ENCOUNTER — Ambulatory Visit: Payer: Medicare Other | Admitting: Family Medicine

## 2023-12-31 VITALS — BP 133/80 | HR 76 | Temp 98.1°F | Ht 65.0 in | Wt 184.5 lb

## 2023-12-31 DIAGNOSIS — R232 Flushing: Secondary | ICD-10-CM | POA: Diagnosis not present

## 2023-12-31 DIAGNOSIS — I10 Essential (primary) hypertension: Secondary | ICD-10-CM

## 2023-12-31 DIAGNOSIS — R61 Generalized hyperhidrosis: Secondary | ICD-10-CM

## 2023-12-31 DIAGNOSIS — N1832 Chronic kidney disease, stage 3b: Secondary | ICD-10-CM | POA: Diagnosis not present

## 2023-12-31 MED ORDER — GABAPENTIN 300 MG PO CAPS: 300.0000 mg | ORAL_CAPSULE | Freq: Two times a day (BID) | ORAL | 2 refills | Status: DC

## 2023-12-31 NOTE — Assessment & Plan Note (Signed)
Well controlled with gabapentin  See a/p for hot flashes

## 2023-12-31 NOTE — Assessment & Plan Note (Signed)
Both hot flashes and night sweats are totally resolved with increase of gabapentin to 300 mg bid  Tolerating well Discussed word finding / brain fog side effect- is tolerable  No balance issues Mood/anxiety is better also   Will plan to continue Encouraged healthy habits

## 2023-12-31 NOTE — Patient Instructions (Addendum)
Keep walking   Add some strength training to your routine, this is important for bone and brain health and can reduce your risk of falls and help your body use insulin properly and regulate weight  Light weights, exercise bands , and internet videos are a good way to start  Yoga (chair or regular), machines , floor exercises or a gym with machines are also good options   Continue your current dose of gabapentin  If side effects or concerns let us know   Take care of yourself

## 2023-12-31 NOTE — Progress Notes (Signed)
Subjective:    Patient ID: [name], female    DOB: 1951-02-05, 73 y.o.   MRN: [id number]  HPI  Wt Readings from Last 3 Encounters:  12/31/23 184 lb 8 oz (83.7 kg)  10/31/23 184 lb 4 oz (83.6 kg)  10/16/23 180 lb 9.6 oz (81.9 kg)   30.70 kg/m  Vitals:   12/31/23 1238 12/31/23 1301  BP: (!) 146/94 133/80  Pulse: 76   Temp: 98.1 F (36.7 C)   SpO2: 96%     Pt presents for follow up of hot flashes and HTN and chronic medical problems  Last visit we increased gabapentin to 300 mg bid for hot flashes and night sweats   Doing much better  Helps the hot flashes and anxiety --absolutely no hot flash or night sweats at all  Feeling like her normal self  No dizziness    Some word finding issues  No other memory issues-nothing concerning  A little brain fog-not affecting day to day life      HTN bp is stable today  No cp or palpitations or headaches or edema  No side effects to medicines  BP Readings from Last 3 Encounters:  12/31/23 133/80  10/31/23 122/64  10/17/23 (!) 151/88    Propranolol ER 120 mg daily  Amlodipine 10 mg daily  No issues at home with blood pressure-has been normal  Always lower at home   Is eating healthy  Exercise - walking    Pulse Readings from Last 3 Encounters:  12/31/23 76  10/31/23 78  10/17/23 90   Lab Results  Component Value Date   NA 140 10/17/2023   K 4.2 10/17/2023   CO2 23 10/17/2023   GLUCOSE 129 (H) 10/17/2023   BUN 23 10/17/2023   CREATININE 1.21 (H) 10/17/2023   CALCIUM 9.7 10/17/2023   GFR 44.65 (L) 10/17/2023   GFRNONAA 47 (L) 10/16/2023   Sees nephrology   Patient Active Problem List   Diagnosis Date Noted   Snoring 10/31/2023   Chronic cough 10/31/2023   Osteopenia 10/19/2023   Hot flashes 10/17/2023   Graves disease 10/17/2023   Night sweats 08/21/2023   Stage 3b chronic kidney disease (HCC) 05/12/2023   Hemorrhoids 10/23/2018   Renal insufficiency 10/23/2018   Ulcer of esophagus  without bleeding    Estrogen deficiency 04/15/2018   Bladder prolapse, female, 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   Dyspepsia 04/14/2017   Colon cancer screening 04/09/2017   Prediabetes 03/30/2017   Medicare annual wellness visit, initial 04/04/2016   Other screening mammogram 12/31/2011   Post-menopausal 12/31/2011   Routine general medical examination at a health care facility 12/23/2011   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   Hypothyroidism 11/08/2008   HSV (herpes simplex virus) infection 09/23/2007   Hypertriglyceridemia 09/23/2007   Essential hypertension 09/23/2007   Allergic rhinitis 09/23/2007   Asthma 09/23/2007   HYPOKALEMIA 04/16/2007   Thrombocytosis (HCC) 04/09/2007   Past Medical History:  Diagnosis Date   Allergic rhinitis    Allergy    Anemia    b12 def.pt. unaware   Asthma    controlled w/ singulair   Herpes simplex without mention of complication    controlled w/ meds (no current fever blisters)   History of hiatal hernia    History of kidney stones    HLD (hyperlipidemia)    borderline-  no meds   HTN (hypertension)    echo- 2006   Hyperthyroidism hx ptu 2006   takes synthroid; had Graves disease,  was treated for that- now thyroid doesn't work   Leukocytosis, unspecified hx of 10 yrs ago   no problem since   Migraine    Mixed incontinence    urge and stress incontinence   Renal cyst right    pt is unsure of this, has had kidney stones   Ureteral calculi right    s/p laser  litho w/ stone extraction 01-28-11   Past Surgical History:  Procedure Laterality Date   BIOPSY  10/16/2018   Procedure: BIOPSY;  Surgeon: Rachael Fee, MD;  Location: [facility] ENDOSCOPY;  Service: Endoscopy;;   BREAST REDUCTION SURGERY  1994   CERVICAL FUSION  2000   fusion c4-6   CERVICAL FUSION  2005   fusion c6-7 and plate removal U1-3   COLONOSCOPY     CYSTOSCOPY W/ RETROGRADES  10/21/2011   Procedure: CYSTOSCOPY WITH RETROGRADE PYELOGRAM;  Surgeon: Kathi Ludwig,  MD;  Location: [facility];  Service: Urology;  Laterality: Right;  CYSTOSCOPY RIGHT RETROGRADE, PYELOGRAM  URETEROSCOPY WITH HOLMIUM LASER AND STONE EXTRACTION   CYSTOSCOPY/RETROGRADE/URETEROSCOPY/STONE EXTRACTION WITH BASKET  01/28/2011   right    CYSTOSCOPY/URETEROSCOPY/HOLMIUM LASER/STENT PLACEMENT Right 11/16/2021   Procedure: CYSTOSCOPY/RETROGRADE/URETEROSCOPY/HOLMIUM LASER/STENT PLACEMENT;  Surgeon: Jannifer Hick, MD;  Location: [facility];  Service: Urology;  Laterality: Right;   ESOPHAGOGASTRODUODENOSCOPY (EGD) WITH PROPOFOL N/A 10/16/2018   Procedure: ESOPHAGOGASTRODUODENOSCOPY (EGD) WITH PROPOFOL;  Surgeon: Rachael Fee, MD;  Location: [facility] ENDOSCOPY;  Service: Endoscopy;  Laterality: N/A;   MOHS SURGERY  04/2023   REDUCTION MAMMAPLASTY Bilateral 1995   TONSILLECTOMY  1960   VAGINAL HYSTERECTOMY  1987   fibroids/anemia   Social History   Tobacco Use   Smoking status: Never   Smokeless tobacco: Never  Vaping Use   Vaping status: Never Used  Substance Use Topics   Alcohol use: No    Alcohol/week: 0.0 standard drinks of alcohol   Drug use: No   Family History  Problem Relation Age of Onset   Osteoporosis Mother    Diabetes Mother    Heart disease Father    Heart failure Father    Diabetes Other        Grandfather   Coronary artery disease Other        Grandmother   Breast cancer Neg Hx    Colon cancer Neg Hx    Esophageal cancer Neg Hx    Stomach cancer Neg Hx    Rectal cancer Neg Hx    Thyroid disease Neg Hx    Allergies  Allergen Reactions   Contrast Media [Iodinated Contrast Media] Anaphylaxis   Shellfish-Derived Products Anaphylaxis   Septra [Sulfamethoxazole-Trimethoprim] Nausea Only   Current Outpatient Medications on File Prior to Visit  Medication Sig Dispense Refill   acetaminophen (TYLENOL) 500 MG tablet Take 1,000 mg by mouth every 6 (six) hours as needed for moderate pain.     amLODipine (NORVASC) 10 MG tablet TAKE 1 TABLET BY MOUTH  EVERY DAY 90 tablet 2   Calcium Carb-Cholecalciferol (CALCIUM 500/D PO) Take 1 tablet by mouth daily.     Cyanocobalamin (B-12 PO) Take 1 capsule by mouth daily.     fenofibrate (TRICOR) 145 MG tablet Take 1 tablet (145 mg total) by mouth daily. 90 tablet 2   levothyroxine (SYNTHROID) 88 MCG tablet Take 1 tablet (88 mcg total) by mouth daily. 90 tablet 3   mirabegron ER (MYRBETRIQ) 50 MG TB24 tablet TAKE 1 TABLET BY MOUTH DAILY 90 tablet 0   montelukast (SINGULAIR)  10 MG tablet TAKE 1 TABLET BY MOUTH DAILY 90 tablet 0   Multiple Vitamin (MULTIVITAMIN) tablet Take 1 tablet by mouth daily.     propranolol ER (INDERAL LA) 120 MG 24 hr capsule TAKE 1 CAPSULE BY MOUTH DAILY 90 capsule 0   SUMAtriptan (IMITREX) 100 MG tablet TAKE 1 TABLET FOR HEADACHE MAY REPEAT ONCE IN 2 HOURS IF NEEDED. MAXIMUM OF 2    TABLETS IN ONE DAY. (Patient taking differently: Take 100 mg by mouth as needed for migraine or headache. TAKE 100 mg TABLET FOR HEADACHE MAY REPEAT ONCE IN 2 HOURS IF NEEDED. MAXIMUM OF 200 mg    TABLETS IN ONE DAY.) 27 tablet 3   No current facility-administered medications on file prior to visit.    Review of Systems  Constitutional:  Negative for activity change, appetite change, fatigue, fever and unexpected weight change.  HENT:  Negative for congestion, ear pain, rhinorrhea, sinus pressure and sore throat.   Eyes:  Negative for pain, redness and visual disturbance.  Respiratory:  Negative for cough, shortness of breath and wheezing.   Cardiovascular:  Negative for chest pain and palpitations.  Gastrointestinal:  Negative for abdominal pain, blood in stool, constipation and diarrhea.  Endocrine: Negative for heat intolerance, polydipsia and polyuria.  Genitourinary:  Negative for dysuria, frequency and urgency.  Musculoskeletal:  Negative for arthralgias, back pain and myalgias.  Skin:  Negative for pallor and rash.  Allergic/Immunologic: Negative for environmental allergies.  Neurological:   Negative for dizziness, syncope and headaches.  Hematological:  Negative for adenopathy. Does not bruise/bleed easily.  Psychiatric/Behavioral:  Negative for decreased concentration and dysphoric mood. The patient is not nervous/anxious.        Occational word finding issues / brain fog   Overall was worse when she was anxious        Objective:   Physical Exam Constitutional:      General: She is not in acute distress.    Appearance: Normal appearance. She is well-developed. She is obese. She is not ill-appearing or diaphoretic.  HENT:     Head: Normocephalic and atraumatic.  Eyes:     Conjunctiva/sclera: Conjunctivae normal.     Pupils: Pupils are equal, round, and reactive to light.  Neck:     Thyroid: No thyromegaly.     Vascular: No carotid bruit or JVD.  Cardiovascular:     Rate and Rhythm: Normal rate and regular rhythm.     Heart sounds: Normal heart sounds.     No gallop.  Pulmonary:     Effort: Pulmonary effort is normal. No respiratory distress.     Breath sounds: Normal breath sounds. No wheezing or rales.  Abdominal:     General: There is no distension or abdominal bruit.     Palpations: Abdomen is soft.  Musculoskeletal:     Cervical back: Normal range of motion and neck supple.     Right lower leg: No edema.     Left lower leg: No edema.  Lymphadenopathy:     Cervical: No cervical adenopathy.  Skin:    General: Skin is warm and dry.     Coloration: Skin is not jaundiced or pale.     Findings: No bruising, erythema or rash.  Neurological:     Mental Status: She is alert.     Cranial Nerves: No cranial nerve deficit.     Motor: No weakness.     Coordination: Coordination normal.     Deep Tendon Reflexes: Reflexes are  normal and symmetric. Reflexes normal.     Comments: No tremor   Psychiatric:        Mood and Affect: Mood normal.           Assessment & Plan:   Problem List Items Addressed This Visit       Cardiovascular and Mediastinum    Hot flashes - Primary   Both hot flashes and night sweats are totally resolved with increase of gabapentin to 300 mg bid  Tolerating well Discussed word finding / brain fog side effect- is tolerable  No balance issues Mood/anxiety is better also   Will plan to continue Encouraged healthy habits       Essential hypertension   BP: 133/80  Down to normal now without active hot flash and anxiety  Will re check this on day when she feels better   Continue Proprnolol ER 120 mg daily  Amlodipine 5 mg daily   Runs lower at home Encouraged good fluid intake for renal health        Genitourinary   Stage 3b chronic kidney disease (HCC)   Last GFR here was 44.6 Sees nephrology Reviewed last note from November Encouraged fluids  Encouraged avoidance of nsaid   Blood pressure is controlled  Nephrology may add ace or arb at next visit         Other   Night sweats   Well controlled with gabapentin  See a/p for hot flashes

## 2023-12-31 NOTE — Assessment & Plan Note (Signed)
BP: 133/80  Down to normal now without active hot flash and anxiety  Will re check this on day when she feels better   Continue Proprnolol ER 120 mg daily  Amlodipine 5 mg daily   Runs lower at home Encouraged good fluid intake for renal health

## 2023-12-31 NOTE — Assessment & Plan Note (Signed)
Last GFR here was 44.6 Sees nephrology Reviewed last note from November Encouraged fluids  Encouraged avoidance of nsaid   Blood pressure is controlled  Nephrology may add ace or arb at next visit

## 2024-01-20 ENCOUNTER — Encounter: Payer: Self-pay | Admitting: Family Medicine

## 2024-01-20 ENCOUNTER — Other Ambulatory Visit: Payer: Self-pay | Admitting: Family Medicine

## 2024-01-21 MED ORDER — PROPRANOLOL HCL ER 120 MG PO CP24: 120.0000 mg | ORAL_CAPSULE | Freq: Every day | ORAL | 1 refills | Status: DC

## 2024-01-21 MED ORDER — MIRABEGRON ER 50 MG PO TB24: 50.0000 mg | ORAL_TABLET | Freq: Every day | ORAL | 1 refills | Status: DC

## 2024-01-21 MED ORDER — MONTELUKAST SODIUM 10 MG PO TABS: 10.0000 mg | ORAL_TABLET | Freq: Every day | ORAL | 1 refills | Status: DC

## 2024-01-26 MED ORDER — LEVOTHYROXINE SODIUM 88 MCG PO TABS: 88.0000 ug | ORAL_TABLET | Freq: Every day | ORAL | 0 refills | Status: DC

## 2024-01-26 NOTE — Addendum Note (Signed)
Addended by: Shon Millet on: 01/26/2024 08:25 AM   Modules accepted: Orders

## 2024-02-10 ENCOUNTER — Ambulatory Visit: Payer: Self-pay | Admitting: Family Medicine

## 2024-02-10 NOTE — Telephone Encounter (Signed)
 Chief Complaint: Medication effects Symptoms: Dizziness, worsened memory issues, sweats Frequency: exacerbation over last few weeks Pertinent Negatives: Patient denies numbness or tingling of arms or legs Disposition: [] ED /[] Urgent Care (no appt availability in office) / [x] Appointment(In office/virtual)/ []  [facility]/ [] Home Care/ [] Refused Recommended Disposition /[] [facility]/ []  Follow-up with PCP Additional Notes: Patient called in stating she would like to be seen in office to discuss Gabapentin medication side effects. This RN felt appropriate for patient to get in asap for evaluation due to patient stating she is having weakness and dizziness/balance issues. Patient's husband also on phone stated patient lost balance and fell, but did not hit her head or have injuries. Patient states she believes this is related to the Gabapentin medication increase because patient and husband said these are all same symptoms that patient was seen and evaluated for last time she was in the office, but the increase in Gabapentin has exacerbated the symptoms. Patient appt made for tomorrow for earliest evaluation with HCP.    Copied from CRM [id number]. Topic: Clinical - Red Word Triage >> Feb 10, 2024  1:59 PM Sonny Dandy B wrote: [facility] that prompted transfer to Nurse Triage: allergic reaction to medication Additional Information  Commented on: Medicine patch causing local rash or itching    Medication causing concerns - would like to review with HCP  Answer Assessment - Initial Assessment Questions 1. NAME of MEDICINE: "What medicine(s) are you calling about?"     Gabapentin 300 mg BID 2. QUESTION: "What is your question?" (e.g., double dose of medicine, side effect)     Patient wants to review medication due to side effects: sweats, memory loss, balance issues, dizziness 3. PRESCRIBER: "Who prescribed the medicine?" Reason: if prescribed by specialist, call should be referred to that  group.     [facility] 4. SYMPTOMS: "Do you have any symptoms?" If Yes, ask: "What symptoms are you having?"  "How bad are the symptoms (e.g., mild, moderate, severe)     Balance issues, dizziness, weakness, memory issues worsening  Protocols used: Medication Question Call-A-AH

## 2024-02-10 NOTE — Telephone Encounter (Signed)
 Does she want me to send in the 100 mg pills so we can start dialing back the dose from 300 mg to 200?

## 2024-02-10 NOTE — Telephone Encounter (Signed)
 Noted, will evaluate for acute issue... will leave long term plan for her to discuss with PCP

## 2024-02-10 NOTE — Telephone Encounter (Signed)
 Pt said she didn't tell the nurse the med needs to be decreased she said she thinks it needs to be increased because all of her sxs are back. Pt and spouse said they will just wait and discuss this with Dr. Ermalene Searing tomorrow. FYI to PCP

## 2024-02-11 ENCOUNTER — Ambulatory Visit: Admitting: Family Medicine

## 2024-02-11 ENCOUNTER — Encounter: Payer: Self-pay | Admitting: Family Medicine

## 2024-02-11 VITALS — BP 138/64 | HR 79 | Temp 99.3°F | Ht 65.0 in

## 2024-02-11 DIAGNOSIS — R531 Weakness: Secondary | ICD-10-CM

## 2024-02-11 DIAGNOSIS — R232 Flushing: Secondary | ICD-10-CM

## 2024-02-11 LAB — CBC WITH DIFFERENTIAL/PLATELET
Basophils Absolute: 0.1 10*3/uL (ref 0.0–0.1)
Basophils Relative: 0.7 % (ref 0.0–3.0)
Eosinophils Absolute: 0.1 10*3/uL (ref 0.0–0.7)
Eosinophils Relative: 1.4 % (ref 0.0–5.0)
HCT: 46.1 % — ABNORMAL HIGH (ref 36.0–46.0)
Hemoglobin: 14.8 g/dL (ref 12.0–15.0)
Lymphocytes Relative: 29 % (ref 12.0–46.0)
Lymphs Abs: 2.7 10*3/uL (ref 0.7–4.0)
MCHC: 32.2 g/dL (ref 30.0–36.0)
MCV: 90.5 fl (ref 78.0–100.0)
Monocytes Absolute: 0.6 10*3/uL (ref 0.1–1.0)
Monocytes Relative: 6.6 % (ref 3.0–12.0)
Neutro Abs: 5.8 10*3/uL (ref 1.4–7.7)
Neutrophils Relative %: 62.3 % (ref 43.0–77.0)
Platelets: 569 10*3/uL — ABNORMAL HIGH (ref 150.0–400.0)
RBC: 5.1 Mil/uL (ref 3.87–5.11)
RDW: 13.6 % (ref 11.5–15.5)
WBC: 9.4 10*3/uL (ref 4.0–10.5)

## 2024-02-11 LAB — T4, FREE: Free T4: 1.43 ng/dL (ref 0.60–1.60)

## 2024-02-11 LAB — COMPREHENSIVE METABOLIC PANEL
ALT: 14 U/L (ref 0–35)
AST: 20 U/L (ref 0–37)
Albumin: 4.7 g/dL (ref 3.5–5.2)
Alkaline Phosphatase: 72 U/L (ref 39–117)
BUN: 26 mg/dL — ABNORMAL HIGH (ref 6–23)
CO2: 24 meq/L (ref 19–32)
Calcium: 10 mg/dL (ref 8.4–10.5)
Chloride: 104 meq/L (ref 96–112)
Creatinine, Ser: 1.39 mg/dL — ABNORMAL HIGH (ref 0.40–1.20)
GFR: 37.72 mL/min — ABNORMAL LOW (ref >60.00)
Glucose, Bld: 101 mg/dL — ABNORMAL HIGH (ref 70–99)
Potassium: 5.2 meq/L — ABNORMAL HIGH (ref 3.5–5.1)
Sodium: 139 meq/L (ref 135–145)
Total Bilirubin: 0.6 mg/dL (ref 0.2–1.2)
Total Protein: 7.3 g/dL (ref 6.0–8.3)

## 2024-02-11 LAB — TSH: TSH: 7.84 u[IU]/mL — ABNORMAL HIGH (ref 0.35–5.50)

## 2024-02-11 LAB — T3, FREE: T3, Free: 4.3 pg/mL — ABNORMAL HIGH (ref 2.3–4.2)

## 2024-02-11 LAB — VITAMIN B12: Vitamin B-12: 674 pg/mL (ref 211–911)

## 2024-02-11 MED ORDER — GABAPENTIN 300 MG PO CAPS: 300.0000 mg | ORAL_CAPSULE | Freq: Three times a day (TID) | ORAL | 2 refills | Status: DC

## 2024-02-11 NOTE — Progress Notes (Signed)
 Patient ID: [name], female    DOB: Jul 26, 1951, 73 y.o.   MRN: [id number]  This visit was conducted in person.  BP 138/64   Pulse 79   Temp 99.3 F (37.4 C) (Oral)   Ht 5\' 5"  (1.651 m)   SpO2 96%   BMI 30.70 kg/m    CC:  Chief Complaint  Patient presents with   Medication Problem    Patient states that the gabapentin was working but now it does not seem to be working. She believes that it may need to be increased.     Subjective:   HPI: [name] is a 73 y.o. female  patient of Dr. Milinda Antis with [facility] presenting on 02/11/2024 for Medication Problem (Patient states that the gabapentin was working but now it does not seem to be working. She believes that it may need to be increased. )    Last year she had been having hot flashes,  anxiety, night sweats, sleepiness, jitteriness, leg weakness, memory loss, several fall Dr. Milinda Antis felt symptoms likely due to menopause and hormone.  She was doing very well with  gabapentin until 2 weeks ago.  Gabapentin 300 mg BID. Now symptoms ave returned.. now back in wheelchair.  Dizziness, legs weak, crumpled to floor yesterday  Hot flashes again, clammy... all symptoms occur when she is having a hot flash.     Does have poor appetite... has post COVID decreased taste.   Not  a great eater but  stable. Drinking Boost. Good water intake.  No vomiting, diarrhea.  No blood loss. No fever.  Has been having allergy related cough.  No SOB. No chest palpitations. No heart racing.     Has seen oncologist for thrombocytosis  BMET, TSH wnl on synthroid.  Relevant past medical, surgical, family and social history reviewed and updated as indicated. Interim medical history since our last visit reviewed. Allergies and medications reviewed and updated. Outpatient Medications Prior to Visit  Medication Sig Dispense Refill   acetaminophen (TYLENOL) 500 MG tablet Take 1,000 mg by mouth every 6 (six) hours as needed for moderate pain.      amLODipine (NORVASC) 10 MG tablet TAKE 1 TABLET BY MOUTH EVERY DAY 90 tablet 2   Calcium Carb-Cholecalciferol (CALCIUM 500/D PO) Take 1 tablet by mouth daily.     Cyanocobalamin (B-12 PO) Take 1 capsule by mouth daily.     fenofibrate (TRICOR) 145 MG tablet Take 1 tablet (145 mg total) by mouth daily. 90 tablet 2   levothyroxine (SYNTHROID) 88 MCG tablet Take 1 tablet (88 mcg total) by mouth daily before breakfast. 90 tablet 0   mirabegron ER (MYRBETRIQ) 50 MG TB24 tablet Take 1 tablet (50 mg total) by mouth daily. 90 tablet 1   montelukast (SINGULAIR) 10 MG tablet Take 1 tablet (10 mg total) by mouth daily. 90 tablet 1   Multiple Vitamin (MULTIVITAMIN) tablet Take 1 tablet by mouth daily.     propranolol ER (INDERAL LA) 120 MG 24 hr capsule Take 1 capsule (120 mg total) by mouth daily. 90 capsule 1   SUMAtriptan (IMITREX) 100 MG tablet TAKE 1 TABLET FOR HEADACHE MAY REPEAT ONCE IN 2 HOURS IF NEEDED. MAXIMUM OF 2    TABLETS IN ONE DAY. (Patient taking differently: Take 100 mg by mouth as needed for migraine or headache. TAKE 100 mg TABLET FOR HEADACHE MAY REPEAT ONCE IN 2 HOURS IF NEEDED. MAXIMUM OF 200 mg    TABLETS IN ONE DAY.)  27 tablet 3   gabapentin (NEURONTIN) 300 MG capsule Take 1 capsule (300 mg total) by mouth 2 (two) times daily. 180 capsule 2   No facility-administered medications prior to visit.     Per HPI unless specifically indicated in ROS section below Review of Systems  All other systems reviewed and are negative.  Objective:  BP 138/64   Pulse 79   Temp 99.3 F (37.4 C) (Oral)   Ht 5\' 5"  (1.651 m)   SpO2 96%   BMI 30.70 kg/m   Wt Readings from Last 3 Encounters:  02/19/24 180 lb 6 oz (81.8 kg)  12/31/23 184 lb 8 oz (83.7 kg)  10/31/23 184 lb 4 oz (83.6 kg)      Physical Exam Constitutional:      General: She is not in acute distress.    Appearance: Normal appearance. She is well-developed. She is not ill-appearing or toxic-appearing.     Comments: In  wheelchair  HENT:     Head: Normocephalic.     Right Ear: Hearing, tympanic membrane, ear canal and external ear normal. Tympanic membrane is not erythematous, retracted or bulging.     Left Ear: Hearing, tympanic membrane, ear canal and external ear normal. Tympanic membrane is not erythematous, retracted or bulging.     Nose: No mucosal edema or rhinorrhea.     Right Sinus: No maxillary sinus tenderness or frontal sinus tenderness.     Left Sinus: No maxillary sinus tenderness or frontal sinus tenderness.     Mouth/Throat:     Pharynx: Uvula midline.  Eyes:     General: Lids are normal. Lids are everted, no foreign bodies appreciated.     Conjunctiva/sclera: Conjunctivae normal.     Pupils: Pupils are equal, round, and reactive to light.  Neck:     Thyroid: No thyroid mass or thyromegaly.     Vascular: No carotid bruit.     Trachea: Trachea normal.  Cardiovascular:     Rate and Rhythm: Normal rate and regular rhythm.     Pulses: Normal pulses.     Heart sounds: Normal heart sounds, S1 normal and S2 normal. No murmur heard.    No friction rub. No gallop.  Pulmonary:     Effort: Pulmonary effort is normal. No tachypnea or respiratory distress.     Breath sounds: Normal breath sounds. No decreased breath sounds, wheezing, rhonchi or rales.  Abdominal:     General: Bowel sounds are normal.     Palpations: Abdomen is soft.     Tenderness: There is no abdominal tenderness.  Musculoskeletal:     Cervical back: Normal range of motion and neck supple.  Skin:    General: Skin is warm and dry.     Findings: No rash.  Neurological:     Mental Status: She is alert.     Cranial Nerves: Cranial nerves 2-12 are intact.     Sensory: Sensation is intact.     Motor: Weakness present.     Coordination: Coordination abnormal.     Gait: Gait abnormal.     Comments: Diffuse weakness and unable to stand  Psychiatric:        Mood and Affect: Mood is not anxious or depressed.        Speech:  Speech normal.        Behavior: Behavior normal. Behavior is cooperative.        Thought Content: Thought content normal.        Judgment: Judgment normal.  Results for orders placed or performed in visit on 02/11/24  Comprehensive metabolic panel   Collection Time: 02/11/24 12:13 PM  Result Value Ref Range   Sodium 139 135 - 145 mEq/L   Potassium 5.2 No hemolysis seen (H) 3.5 - 5.1 mEq/L   Chloride 104 96 - 112 mEq/L   CO2 24 19 - 32 mEq/L   Glucose, Bld 101 (H) 70 - 99 mg/dL   BUN 26 (H) 6 - 23 mg/dL   Creatinine, Ser 9.52 (H) 0.40 - 1.20 mg/dL   Total Bilirubin 0.6 0.2 - 1.2 mg/dL   Alkaline Phosphatase 72 39 - 117 U/L   AST 20 0 - 37 U/L   ALT 14 0 - 35 U/L   Total Protein 7.3 6.0 - 8.3 g/dL   Albumin 4.7 3.5 - 5.2 g/dL   GFR 84.13 (L) >24.40 mL/min   Calcium 10.0 8.4 - 10.5 mg/dL  CBC with Differential/Platelet   Collection Time: 02/11/24 12:13 PM  Result Value Ref Range   WBC 9.4 4.0 - 10.5 K/uL   RBC 5.10 3.87 - 5.11 Mil/uL   Hemoglobin 14.8 12.0 - 15.0 g/dL   HCT 10.2 (H) 72.5 - 36.6 %   MCV 90.5 78.0 - 100.0 fl   MCHC 32.2 30.0 - 36.0 g/dL   RDW 44.0 34.7 - 42.5 %   Platelets 569.0 (H) 150.0 - 400.0 K/uL   Neutrophils Relative % 62.3 43.0 - 77.0 %   Lymphocytes Relative 29.0 12.0 - 46.0 %   Monocytes Relative 6.6 3.0 - 12.0 %   Eosinophils Relative 1.4 0.0 - 5.0 %   Basophils Relative 0.7 0.0 - 3.0 %   Neutro Abs 5.8 1.4 - 7.7 K/uL   Lymphs Abs 2.7 0.7 - 4.0 K/uL   Monocytes Absolute 0.6 0.1 - 1.0 K/uL   Eosinophils Absolute 0.1 0.0 - 0.7 K/uL   Basophils Absolute 0.1 0.0 - 0.1 K/uL  Vitamin B12   Collection Time: 02/11/24 12:13 PM  Result Value Ref Range   Vitamin B-12 674 211 - 911 pg/mL  TSH   Collection Time: 02/11/24 12:13 PM  Result Value Ref Range   TSH 7.84 (H) 0.35 - 5.50 uIU/mL  T4, free   Collection Time: 02/11/24 12:13 PM  Result Value Ref Range   Free T4 1.43 0.60 - 1.60 ng/dL  T3, free   Collection Time: 02/11/24 12:13 PM   Result Value Ref Range   T3, Free 4.3 (H) 2.3 - 4.2 pg/mL    Assessment and Plan  Hot flashes  Weakness -     Comprehensive metabolic panel -     CBC with Differential/Platelet -     Vitamin B12 -     TSH -     T4, free -     T3, free   Discussed unusual patient case with patient's PCP.  She has had a significant workup of hot flashes and weakness in the past.  She did improve remarkably with initiation of gabapentin.  Current symptoms do not clearly seem to be a side effect of gabapentin.  Will evaluate with labs but if labs unremarkable she will increase gabapentin to 300 mg 3 times daily.  She will make close follow-up with PCP in 1 week.    Return in about 1 week (around 02/18/2024) for  WITH PCP follow up hot flaskes and weakness.Kerby Nora, MD

## 2024-02-11 NOTE — Patient Instructions (Signed)
 If labs unremarkable.. increase gabapentin to 300 mg three times daily.  Please stop at the lab to have labs drawn.

## 2024-02-17 ENCOUNTER — Ambulatory Visit: Admitting: Family Medicine

## 2024-02-18 DIAGNOSIS — H903 Sensorineural hearing loss, bilateral: Secondary | ICD-10-CM | POA: Diagnosis not present

## 2024-02-18 DIAGNOSIS — H6123 Impacted cerumen, bilateral: Secondary | ICD-10-CM | POA: Diagnosis not present

## 2024-02-19 ENCOUNTER — Ambulatory Visit (INDEPENDENT_AMBULATORY_CARE_PROVIDER_SITE_OTHER): Admitting: Family Medicine

## 2024-02-19 ENCOUNTER — Encounter: Payer: Self-pay | Admitting: Family Medicine

## 2024-02-19 VITALS — BP 110/78 | HR 83 | Temp 98.3°F | Ht 65.0 in | Wt 180.4 lb

## 2024-02-19 DIAGNOSIS — R8290 Unspecified abnormal findings in urine: Secondary | ICD-10-CM

## 2024-02-19 DIAGNOSIS — R531 Weakness: Secondary | ICD-10-CM | POA: Insufficient documentation

## 2024-02-19 DIAGNOSIS — E039 Hypothyroidism, unspecified: Secondary | ICD-10-CM

## 2024-02-19 DIAGNOSIS — N1832 Chronic kidney disease, stage 3b: Secondary | ICD-10-CM | POA: Diagnosis not present

## 2024-02-19 DIAGNOSIS — D75839 Thrombocytosis, unspecified: Secondary | ICD-10-CM

## 2024-02-19 DIAGNOSIS — R232 Flushing: Secondary | ICD-10-CM | POA: Diagnosis not present

## 2024-02-19 LAB — BASIC METABOLIC PANEL
BUN: 34 mg/dL — ABNORMAL HIGH (ref 6–23)
CO2: 22 meq/L (ref 19–32)
Calcium: 9.6 mg/dL (ref 8.4–10.5)
Chloride: 108 meq/L (ref 96–112)
Creatinine, Ser: 1.15 mg/dL (ref 0.40–1.20)
GFR: 47.35 mL/min — ABNORMAL LOW (ref >60.00)
Glucose, Bld: 103 mg/dL — ABNORMAL HIGH (ref 70–99)
Potassium: 4.8 meq/L (ref 3.5–5.1)
Sodium: 139 meq/L (ref 135–145)

## 2024-02-19 LAB — CBC WITH DIFFERENTIAL/PLATELET
Basophils Absolute: 0.1 10*3/uL (ref 0.0–0.1)
Basophils Relative: 1.1 % (ref 0.0–3.0)
Eosinophils Absolute: 0.3 10*3/uL (ref 0.0–0.7)
Eosinophils Relative: 3.6 % (ref 0.0–5.0)
HCT: 41.2 % (ref 36.0–46.0)
Hemoglobin: 13.4 g/dL (ref 12.0–15.0)
Lymphocytes Relative: 25 % (ref 12.0–46.0)
Lymphs Abs: 2 10*3/uL (ref 0.7–4.0)
MCHC: 32.6 g/dL (ref 30.0–36.0)
MCV: 90.1 fl (ref 78.0–100.0)
Monocytes Absolute: 0.5 10*3/uL (ref 0.1–1.0)
Monocytes Relative: 6 % (ref 3.0–12.0)
Neutro Abs: 5 10*3/uL (ref 1.4–7.7)
Neutrophils Relative %: 64.3 % (ref 43.0–77.0)
Platelets: 453 10*3/uL — ABNORMAL HIGH (ref 150.0–400.0)
RBC: 4.57 Mil/uL (ref 3.87–5.11)
RDW: 13.6 % (ref 11.5–15.5)
WBC: 7.8 10*3/uL (ref 4.0–10.5)

## 2024-02-19 LAB — POC URINALSYSI DIPSTICK (AUTOMATED)
Bilirubin, UA: NEGATIVE
Blood, UA: NEGATIVE
Glucose, UA: NEGATIVE
Ketones, UA: NEGATIVE
Nitrite, UA: NEGATIVE
Protein, UA: POSITIVE — AB
Spec Grav, UA: 1.02 (ref 1.010–1.025)
Urobilinogen, UA: 0.2 U/dL
pH, UA: 6 (ref 5.0–8.0)

## 2024-02-19 MED ORDER — GABAPENTIN 300 MG PO CAPS: 300.0000 mg | ORAL_CAPSULE | Freq: Three times a day (TID) | ORAL | 2 refills | Status: DC

## 2024-02-19 NOTE — Patient Instructions (Signed)
 Lab today  Urinalysis today   We sill re check thyroid for next physical   Continue gabapentin three times daily   If the weakness dose not improve let us know  Take care of yourself

## 2024-02-19 NOTE — Assessment & Plan Note (Addendum)
 Watching  Under care of hematology  Plt was 569 last time  Reviewed last heme note Continues as 81 mg daily   No headache or syncope

## 2024-02-19 NOTE — Assessment & Plan Note (Addendum)
 This is improved Happened in setting of worsened menopausal symptoms Improved with increase in gabapentin dose  Had one fall but no headache or syncope (in setting of high plt count)  Today urinalysis notes small leuk-will send for culture

## 2024-02-19 NOTE — Progress Notes (Signed)
 Subjective:    Patient ID: [name], female    DOB: Jan 26, 1951, 73 y.o.   MRN: [id number]  HPI  Wt Readings from Last 3 Encounters:  02/19/24 180 lb 6 oz (81.8 kg)  12/31/23 184 lb 8 oz (83.7 kg)  10/31/23 184 lb 4 oz (83.6 kg)   30.02 kg/m  Vitals:   02/19/24 0905  BP: 110/78  Pulse: 83  Temp: 98.3 F (36.8 C)  SpO2: 97%    Pt presents for follow up of hot flashes   Also dec GFR   Also hypothyroid    Menopause symptoms worsened and saw Dr Ermalene Searing  Severe hot flashes Bella Kennedy and hot flashes   Went up on on gabapentin to tid Feeling better  Occational sweats  Anxiety is better now also   Is fairly weak  Fell twice last week  Less weak now  Big bruise on her leg -is getting better    Lab Results  Component Value Date   NA 139 02/19/2024   K 4.8 02/19/2024   CO2 22 02/19/2024   GLUCOSE 103 (H) 02/19/2024   BUN 34 (H) 02/19/2024   CREATININE 1.15 02/19/2024   CALCIUM 9.6 02/19/2024   GFR 47.35 (L) 02/19/2024   GFRNONAA 47 (L) 10/16/2023    ? If got dehydrated from  Is drinking lots of fluids   No urinary symptoms at all   Her nephrologist retired / will be getting a new one    Lab Results  Component Value Date   WBC 7.8 02/19/2024   HGB 13.4 02/19/2024   HCT 41.2 02/19/2024   MCV 90.1 02/19/2024   PLT 453.0 (H) 02/19/2024  Sees hematology for platelets      Lab Results  Component Value Date   ALT 14 02/11/2024   AST 20 02/11/2024   ALKPHOS 72 02/11/2024   BILITOT 0.6 02/11/2024   Lab Results  Component Value Date   TSH 7.84 (H) 02/11/2024  Unsure if she may have missed a dose  Is good about  Takes it correctly   Urinalysis today Results for orders placed or performed in visit on 02/19/24  CBC with Differential/Platelet   Collection Time: 02/19/24  9:51 AM  Result Value Ref Range   WBC 7.8 4.0 - 10.5 K/uL   RBC 4.57 3.87 - 5.11 Mil/uL   Hemoglobin 13.4 12.0 - 15.0 g/dL   HCT 40.9 81.1 - 91.4 %   MCV 90.1 78.0 -  100.0 fl   MCHC 32.6 30.0 - 36.0 g/dL   RDW 78.2 95.6 - 21.3 %   Platelets 453.0 (H) 150.0 - 400.0 K/uL   Neutrophils Relative % 64.3 43.0 - 77.0 %   Lymphocytes Relative 25.0 12.0 - 46.0 %   Monocytes Relative 6.0 3.0 - 12.0 %   Eosinophils Relative 3.6 0.0 - 5.0 %   Basophils Relative 1.1 0.0 - 3.0 %   Neutro Abs 5.0 1.4 - 7.7 K/uL   Lymphs Abs 2.0 0.7 - 4.0 K/uL   Monocytes Absolute 0.5 0.1 - 1.0 K/uL   Eosinophils Absolute 0.3 0.0 - 0.7 K/uL   Basophils Absolute 0.1 0.0 - 0.1 K/uL  Basic metabolic panel   Collection Time: 02/19/24  9:51 AM  Result Value Ref Range   Sodium 139 135 - 145 mEq/L   Potassium 4.8 3.5 - 5.1 mEq/L   Chloride 108 96 - 112 mEq/L   CO2 22 19 - 32 mEq/L   Glucose, Bld 103 (H) 70 - 99  mg/dL   BUN 34 (H) 6 - 23 mg/dL   Creatinine, Ser 6.21 0.40 - 1.20 mg/dL   GFR 30.86 (L) >57.84 mL/min   Calcium 9.6 8.4 - 10.5 mg/dL  POCT Urinalysis Dipstick (Automated)   Collection Time: 02/19/24 10:44 AM  Result Value Ref Range   Color, UA Yellow    Clarity, UA Clear    Glucose, UA Negative Negative   Bilirubin, UA Negative    Ketones, UA Negative    Spec Grav, UA 1.020 1.010 - 1.025   Blood, UA Negative    pH, UA 6.0 5.0 - 8.0   Protein, UA Positive (A) Negative   Urobilinogen, UA 0.2 0.2 or 1.0 E.U./dL   Nitrite, UA Negative    Leukocytes, UA Small (1+) (A) Negative    Culture pending      Patient Active Problem List   Diagnosis Date Noted   General weakness 02/19/2024   Snoring 10/31/2023   Chronic cough 10/31/2023   Osteopenia 10/19/2023   Hot flashes 10/17/2023   Graves disease 10/17/2023   Night sweats 08/21/2023   Stage 3b chronic kidney disease (HCC) 05/12/2023   Hemorrhoids 10/23/2018   Renal insufficiency 10/23/2018   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    Estrogen deficiency 04/15/2018   Bladder prolapse, female, 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   Dyspepsia 04/14/2017   Colon cancer screening 04/09/2017   Prediabetes 03/30/2017   Medicare  annual wellness visit, initial 04/04/2016   Other screening mammogram 12/31/2011   Post-menopausal 12/31/2011   Routine general medical examination at a health care facility 12/23/2011   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   Hypothyroidism 11/08/2008   HSV (herpes simplex virus) infection 09/23/2007   Hypertriglyceridemia 09/23/2007   Essential hypertension 09/23/2007   Allergic rhinitis 09/23/2007   Asthma 09/23/2007   HYPOKALEMIA 04/16/2007   Thrombocytosis (HCC) 04/09/2007   Past Medical History:  Diagnosis Date   Allergic rhinitis    Allergy    Anemia    b12 def.pt. unaware   Asthma    controlled w/ singulair   Herpes simplex without mention of complication    controlled w/ meds (no current fever blisters)   History of hiatal hernia    History of kidney stones    HLD (hyperlipidemia)    borderline-  no meds   HTN (hypertension)    echo- 2006   Hyperthyroidism hx ptu 2006   takes synthroid; had Graves disease, was treated for that- now thyroid doesn't work   Leukocytosis, unspecified hx of 10 yrs ago   no problem since   Migraine    Mixed incontinence    urge and stress incontinence   Renal cyst right    pt is unsure of this, has had kidney stones   Ureteral calculi right    s/p laser  litho w/ stone extraction 01-28-11   Past Surgical History:  Procedure Laterality Date   BIOPSY  10/16/2018   Procedure: BIOPSY;  Surgeon: Rachael Fee, MD;  Location: [facility]COPY;  Service: Endoscopy;;   BREAST REDUCTION SURGERY  1994   CERVICAL FUSION  2000   fusion c4-6   CERVICAL FUSION  2005   fusion c6-7 and plate removal O9-6   COLONOSCOPY     CYSTOSCOPY W/ RETROGRADES  10/21/2011   Procedure: CYSTOSCOPY WITH RETROGRADE PYELOGRAM;  Surgeon: Kathi Ludwig, MD;  Location: [facility];  Service: Urology;  Laterality: Right;  CYSTOSCOPY RIGHT RETROGRADE, PYELOGRAM  URETEROSCOPY WITH HOLMIUM LASER AND STONE EXTRACTION  CYSTOSCOPY/RETROGRADE/URETEROSCOPY/STONE EXTRACTION WITH BASKET  01/28/2011   right    CYSTOSCOPY/URETEROSCOPY/HOLMIUM LASER/STENT PLACEMENT Right 11/16/2021   Procedure: CYSTOSCOPY/RETROGRADE/URETEROSCOPY/HOLMIUM LASER/STENT PLACEMENT;  Surgeon: Jannifer Hick, MD;  Location: [facility] ORS;  Service: Urology;  Laterality: Right;   ESOPHAGOGASTRODUODENOSCOPY (EGD) WITH PROPOFOL N/A 10/16/2018   Procedure: ESOPHAGOGASTRODUODENOSCOPY (EGD) WITH PROPOFOL;  Surgeon: Rachael Fee, MD;  Location: [facility] ENDOSCOPY;  Service: Endoscopy;  Laterality: N/A;   MOHS SURGERY  04/2023   REDUCTION MAMMAPLASTY Bilateral 1995   TONSILLECTOMY  1960   VAGINAL HYSTERECTOMY  1987   fibroids/anemia   Social History   Tobacco Use   Smoking status: Never   Smokeless tobacco: Never  Vaping Use   Vaping status: Never Used  Substance Use Topics   Alcohol use: No    Alcohol/week: 0.0 standard drinks of alcohol   Drug use: No   Family History  Problem Relation Age of Onset   Osteoporosis Mother    Diabetes Mother    Heart disease Father    Heart failure Father    Diabetes Other        Grandfather   Coronary artery disease Other        Grandmother   Breast cancer Neg Hx    Colon cancer Neg Hx    Esophageal cancer Neg Hx    Stomach cancer Neg Hx    Rectal cancer Neg Hx    Thyroid disease Neg Hx    Allergies  Allergen Reactions   Contrast Media [Iodinated Contrast Media] Anaphylaxis   Shellfish-Derived Products Anaphylaxis   Septra [Sulfamethoxazole-Trimethoprim] Nausea Only   Current Outpatient Medications on File Prior to Visit  Medication Sig Dispense Refill   acetaminophen (TYLENOL) 500 MG tablet Take 1,000 mg by mouth every 6 (six) hours as needed for moderate pain.     amLODipine (NORVASC) 10 MG tablet TAKE 1 TABLET BY MOUTH EVERY DAY 90 tablet 2   Calcium Carb-Cholecalciferol (CALCIUM 500/D PO) Take 1 tablet by mouth daily.     Cyanocobalamin (B-12 PO) Take 1 capsule by mouth daily.      fenofibrate (TRICOR) 145 MG tablet Take 1 tablet (145 mg total) by mouth daily. 90 tablet 2   levothyroxine (SYNTHROID) 88 MCG tablet Take 1 tablet (88 mcg total) by mouth daily before breakfast. 90 tablet 0   mirabegron ER (MYRBETRIQ) 50 MG TB24 tablet Take 1 tablet (50 mg total) by mouth daily. 90 tablet 1   montelukast (SINGULAIR) 10 MG tablet Take 1 tablet (10 mg total) by mouth daily. 90 tablet 1   Multiple Vitamin (MULTIVITAMIN) tablet Take 1 tablet by mouth daily.     propranolol ER (INDERAL LA) 120 MG 24 hr capsule Take 1 capsule (120 mg total) by mouth daily. 90 capsule 1   SUMAtriptan (IMITREX) 100 MG tablet TAKE 1 TABLET FOR HEADACHE MAY REPEAT ONCE IN 2 HOURS IF NEEDED. MAXIMUM OF 2    TABLETS IN ONE DAY. (Patient taking differently: Take 100 mg by mouth as needed for migraine or headache. TAKE 100 mg TABLET FOR HEADACHE MAY REPEAT ONCE IN 2 HOURS IF NEEDED. MAXIMUM OF 200 mg    TABLETS IN ONE DAY.) 27 tablet 3   No current facility-administered medications on file prior to visit.    Review of Systems  Constitutional:  Negative for activity change, appetite change, fatigue, fever and unexpected weight change.       Fatigue Generalized weakness  Is improved from last visit   HENT:  Negative for congestion, ear pain,  rhinorrhea, sinus pressure and sore throat.   Eyes:  Negative for pain, redness and visual disturbance.  Respiratory:  Negative for cough, shortness of breath and wheezing.   Cardiovascular:  Negative for chest pain and palpitations.  Gastrointestinal:  Negative for abdominal pain, blood in stool, constipation and diarrhea.  Endocrine: Positive for heat intolerance. Negative for polydipsia and polyuria.       Sweats are improved   Genitourinary:  Negative for dysuria, frequency and urgency.  Musculoskeletal:  Negative for arthralgias, back pain and myalgias.  Skin:  Negative for pallor and rash.  Allergic/Immunologic: Negative for environmental allergies.   Neurological:  Negative for dizziness, syncope and headaches.       Weak all over at times Not focal   Hematological:  Negative for adenopathy. Does not bruise/bleed easily.  Psychiatric/Behavioral:  Negative for decreased concentration and dysphoric mood. The patient is not nervous/anxious.        Objective:   Physical Exam Constitutional:      General: She is not in acute distress.    Appearance: Normal appearance. She is well-developed. She is obese. She is not ill-appearing or diaphoretic.  HENT:     Head: Normocephalic and atraumatic.  Eyes:     Conjunctiva/sclera: Conjunctivae normal.     Pupils: Pupils are equal, round, and reactive to light.  Neck:     Thyroid: No thyromegaly.     Vascular: No carotid bruit or JVD.  Cardiovascular:     Rate and Rhythm: Normal rate and regular rhythm.     Heart sounds: Normal heart sounds.     No gallop.  Pulmonary:     Effort: Pulmonary effort is normal. No respiratory distress.     Breath sounds: Normal breath sounds. No wheezing or rales.  Abdominal:     General: There is no distension or abdominal bruit.     Palpations: Abdomen is soft.  Musculoskeletal:     Cervical back: Normal range of motion and neck supple.     Right lower leg: No edema.     Left lower leg: No edema.  Lymphadenopathy:     Cervical: No cervical adenopathy.  Skin:    General: Skin is warm and dry.     Coloration: Skin is not pale.     Findings: No rash.  Neurological:     Mental Status: She is alert.     Cranial Nerves: No cranial nerve deficit.     Motor: No tremor or abnormal muscle tone.     Coordination: Coordination normal.     Deep Tendon Reflexes: Reflexes are normal and symmetric. Reflexes normal.     Comments: No focal weakness  No tremor at time of exam   Psychiatric:        Mood and Affect: Mood normal.           Assessment & Plan:   Problem List Items Addressed This Visit       Cardiovascular and Mediastinum   Hot flashes -  Primary   Had a visit with Dr Ermalene Searing with increase in symptoms  We went up on gabapentin 300 mg to tid and things improved a lot  Better today  Stable vitals Less weak  Will continue to monitor         Endocrine   Hypothyroidism   Tsh is elevated Lab Results  Component Value Date   TSH 7.84 (H) 02/11/2024   However in light of increased sweats -hesitant to increase dose of levothyroxine  Currently takes 88 mcg  Will re check at next visit She cannot r/o missing a dose Stressed importance of compliance         Genitourinary   Stage 3b chronic kidney disease (HCC)   Last bmet noted increase K and increase cr/ lower gfr On urinalysis today some leuk  Culture pending  Encouraged better hydration  Also sweating less  Bmet repeated today      Relevant Orders   POCT Urinalysis Dipstick (Automated) (Completed)   CBC with Differential/Platelet (Completed)   Basic metabolic panel (Completed)   Urine Culture     Hematopoietic and Hemostatic   Thrombocytosis (HCC)   Watching  Under care of hematology  Plt was 569 last time  Reviewed last heme note Continues as 81 mg daily   No headache or syncope       Relevant Orders   CBC with Differential/Platelet (Completed)     Other   General weakness   This is improved Happened in setting of worsened menopausal symptoms Improved with increase in gabapentin dose  Had one fall but no headache or syncope (in setting of high plt count)  Today urinalysis notes small leuk-will send for culture       Relevant Orders   POCT Urinalysis Dipstick (Automated) (Completed)   CBC with Differential/Platelet (Completed)   Other Visit Diagnoses       Abnormal urinalysis       Relevant Orders   Urine Culture

## 2024-02-19 NOTE — Assessment & Plan Note (Signed)
 Had a visit with Dr Ermalene Searing with increase in symptoms  We went up on gabapentin 300 mg to tid and things improved a lot  Better today  Stable vitals Less weak  Will continue to monitor

## 2024-02-19 NOTE — Assessment & Plan Note (Signed)
 Last bmet noted increase K and increase cr/ lower gfr On urinalysis today some leuk  Culture pending  Encouraged better hydration  Also sweating less  Bmet repeated today

## 2024-02-19 NOTE — Assessment & Plan Note (Signed)
 Tsh is elevated Lab Results  Component Value Date   TSH 7.84 (H) 02/11/2024   However in light of increased sweats -hesitant to increase dose of levothyroxine  Currently takes 88 mcg  Will re check at next visit She cannot r/o missing a dose Stressed importance of compliance

## 2024-02-20 LAB — URINE CULTURE
MICRO NUMBER:: 16197581
SPECIMEN QUALITY:: ADEQUATE

## 2024-03-19 ENCOUNTER — Other Ambulatory Visit: Payer: Self-pay | Admitting: Family Medicine

## 2024-03-19 DIAGNOSIS — Z1231 Encounter for screening mammogram for malignant neoplasm of breast: Secondary | ICD-10-CM

## 2024-03-21 ENCOUNTER — Other Ambulatory Visit: Payer: Self-pay | Admitting: Family Medicine

## 2024-03-26 ENCOUNTER — Other Ambulatory Visit: Payer: Self-pay | Admitting: Family Medicine

## 2024-03-27 ENCOUNTER — Other Ambulatory Visit: Payer: Self-pay | Admitting: Family Medicine

## 2024-04-08 ENCOUNTER — Other Ambulatory Visit: Payer: Self-pay | Admitting: Family Medicine

## 2024-04-09 NOTE — Progress Notes (Unsigned)
 [facility] OFFICE PROGRESS NOTE  Tower, Manley Seeds, MD [address] [place] [address]  DIAGNOSIS: Thrombocytosis, mutation testing showed [id number] which can put her at risk for leukemia or MDS in the future   PRIOR THERAPY: None   CURRENT THERAPY: Observation   INTERVAL HISTORY: [name] 73 y.o. female returns to the clinic today for a follow up visit. The patient was seen in the clinic to establish care in October 2024. She was referred for thrombocytosis which had been occurring since at least 2008 (earliest lab records). Therefore, we recommended Jak 2 mutation testing to assess for myeloproliferative disorder. At her last appointment, she was endorsing feeling well except worsening hot flashes and memory concerns.   She has been off hormone replacement therapy for several years due to a kidney stone complication and subsequent loss of one kidney.   She saw her PCP for hot flashes and short term memory issues. She is being trailed on gabapentin . Her hot flashes resolved with gabapentin  300 mg. This has helped her significantly.    She denies any recent infections, unusual lymph node enlargement, fever, nausea, vomiting, abdominal pain, or fullness. She also denies any abnormal bleeding or bruising.   She is taking aspirin. She denies fever. Her BP is high but she has not taken her BP meds today.   She denies recent steroids or hormone use.  She denies history of DVT, MI, PE, or CVA. She is here for evaluation and repeat blood work. [name]   MEDICAL HISTORY: Past Medical History:  Diagnosis Date   Allergic rhinitis    Allergy    Anemia    b12 def.pt. unaware   Asthma    controlled w/ singulair    Herpes simplex without mention of complication    controlled w/ meds (no current fever blisters)   History of hiatal hernia    History of kidney stones    HLD (hyperlipidemia)    borderline-  no meds   HTN (hypertension)    echo- 2006   Hyperthyroidism hx ptu  2006   takes synthroid ; had Graves disease, was treated for that- now thyroid  doesn't work   Leukocytosis, unspecified hx of 10 yrs ago   no problem since   Migraine    Mixed incontinence    urge and stress incontinence   Renal cyst right    pt is unsure of this, has had kidney stones   Ureteral calculi right    s/p laser  litho w/ stone extraction 01-28-11    ALLERGIES:  is allergic to contrast media [iodinated contrast media], shellfish-derived products, and septra  [sulfamethoxazole -trimethoprim ].  MEDICATIONS:  Current Outpatient Medications  Medication Sig Dispense Refill   acetaminophen  (TYLENOL ) 500 MG tablet Take 1,000 mg by mouth every 6 (six) hours as needed for moderate pain.     amLODipine  (NORVASC ) 10 MG tablet TAKE 1 TABLET BY MOUTH EVERY DAY 90 tablet 2   aspirin EC 81 MG tablet Take 81 mg by mouth daily. Swallow whole.     Calcium  Carb-Cholecalciferol (CALCIUM  500/D PO) Take 1 tablet by mouth daily.     Cyanocobalamin  (B-12 PO) Take 1 capsule by mouth daily.     fenofibrate  (TRICOR ) 145 MG tablet Take 1 tablet (145 mg total) by mouth daily. 90 tablet 2   gabapentin  (NEURONTIN ) 300 MG capsule Take 1 capsule (300 mg total) by mouth 3 (three) times daily. 90 capsule 2   levothyroxine  (SYNTHROID ) 88 MCG tablet TAKE 1 TABLET  BY MOUTH DAILY  BEFORE BREAKFAST 90 tablet 0   mirabegron  ER (MYRBETRIQ ) 50 MG TB24 tablet Take 1 tablet (50 mg total) by mouth daily. 90 tablet 1   montelukast  (SINGULAIR ) 10 MG tablet TAKE 1 TABLET BY MOUTH DAILY 90 tablet 0   Multiple Vitamin (MULTIVITAMIN) tablet Take 1 tablet by mouth daily.     propranolol  ER (INDERAL  LA) 120 MG 24 hr capsule TAKE 1 CAPSULE BY MOUTH DAILY 90 capsule 0   SUMAtriptan  (IMITREX ) 100 MG tablet TAKE 1 TABLET FOR HEADACHE MAY REPEAT ONCE IN 2 HOURS IF NEEDED. MAXIMUM OF 2    TABLETS IN ONE DAY. (Patient taking differently: Take 100 mg by mouth as needed for migraine or headache. TAKE 100 mg TABLET FOR HEADACHE MAY REPEAT  ONCE IN 2 HOURS IF NEEDED. MAXIMUM OF 200 mg    TABLETS IN ONE DAY.) 27 tablet 3   No current facility-administered medications for this visit.    SURGICAL HISTORY:  Past Surgical History:  Procedure Laterality Date   BIOPSY  10/16/2018   Procedure: BIOPSY;  Surgeon: Janel Medford, MD;  Location: [facility] ENDOSCOPY;  Service: Endoscopy;;   BREAST REDUCTION SURGERY  1994   CERVICAL FUSION  2000   fusion c4-6   CERVICAL FUSION  2005   fusion c6-7 and plate removal W0-9   COLONOSCOPY     CYSTOSCOPY W/ RETROGRADES  10/21/2011   Procedure: CYSTOSCOPY WITH RETROGRADE PYELOGRAM;  Surgeon: Edmund Gouge, MD;  Location: [facility];  Service: Urology;  Laterality: Right;  CYSTOSCOPY RIGHT RETROGRADE, PYELOGRAM  URETEROSCOPY WITH HOLMIUM LASER AND STONE EXTRACTION   CYSTOSCOPY/RETROGRADE/URETEROSCOPY/STONE EXTRACTION WITH BASKET  01/28/2011   right    CYSTOSCOPY/URETEROSCOPY/HOLMIUM LASER/STENT PLACEMENT Right 11/16/2021   Procedure: CYSTOSCOPY/RETROGRADE/URETEROSCOPY/HOLMIUM LASER/STENT PLACEMENT;  Surgeon: Lahoma Pigg, MD;  Location: [facility] ORS;  Service: Urology;  Laterality: Right;   ESOPHAGOGASTRODUODENOSCOPY (EGD) WITH PROPOFOL  N/A 10/16/2018   Procedure: ESOPHAGOGASTRODUODENOSCOPY (EGD) WITH PROPOFOL ;  Surgeon: Janel Medford, MD;  Location: [facility] ENDOSCOPY;  Service: Endoscopy;  Laterality: N/A;   MOHS SURGERY  04/2023   REDUCTION MAMMAPLASTY Bilateral 1995   TONSILLECTOMY  1960   VAGINAL HYSTERECTOMY  1987   fibroids/anemia    REVIEW OF SYSTEMS:   Review of Systems  Constitutional: Negative for appetite change, chills, fever and unexpected weight change.  HENT: Negative for mouth sores, nosebleeds, sore throat and trouble swallowing.   Eyes: Negative for eye problems and icterus.  Respiratory: Negative for cough, hemoptysis, shortness of breath and wheezing.   Cardiovascular: Negative for chest pain and leg swelling.  Gastrointestinal: Negative for abdominal  pain, constipation, diarrhea, nausea and vomiting.  Genitourinary: Negative for bladder incontinence, difficulty urinating, dysuria, frequency and hematuria.   Musculoskeletal: Negative for back pain, gait problem, neck pain and neck stiffness.  Skin: Negative for itching and rash.  Neurological: Negative for dizziness, extremity weakness, gait problem, headaches, light-headedness and seizures.  Hematological: Negative for adenopathy. Does not bruise/bleed easily.  Psychiatric/Behavioral: Positive for anxious mood. Positive for memory concerns. negative for confusion, depression and sleep disturbance.    PHYSICAL EXAMINATION:  Blood pressure (!) 146/107, pulse 81, temperature 97.9 F (36.6 C), temperature source Temporal, resp. rate 14, weight 180 lb 8 oz (81.9 kg), SpO2 98%.  ECOG PERFORMANCE STATUS: 1  Physical Exam  Constitutional: Oriented to person, place, and time and well-developed, well-nourished, and in no distress.  HENT:  Head: Normocephalic and atraumatic.  Mouth/Throat: Oropharynx is clear and moist. No oropharyngeal exudate.  Eyes: Conjunctivae  are normal. Right eye exhibits no discharge. Left eye exhibits no discharge. No scleral icterus.  Neck: Normal range of motion. Neck supple.  Cardiovascular: Normal rate, regular rhythm, normal heart sounds and intact distal pulses.   Pulmonary/Chest: Effort normal and breath sounds normal. No respiratory distress. No wheezes. No rales.  Abdominal: Soft. Bowel sounds are normal. Exhibits no distension and no mass. There is no tenderness.  Musculoskeletal: Normal range of motion. Exhibits no edema.  Lymphadenopathy:    No cervical adenopathy.  Neurological: Alert and oriented to person, place, and time. Exhibits normal muscle tone. Gait normal. Coordination normal.  Skin: Skin is warm and dry. No rash noted. Not diaphoretic. No erythema. No pallor.  Psychiatric: Mood, memory and judgment normal.  Vitals reviewed.  LABORATORY  DATA: Lab Results  Component Value Date   WBC 8.9 04/14/2024   HGB 13.6 04/14/2024   HCT 41.4 04/14/2024   MCV 88.5 04/14/2024   PLT 426 (H) 04/14/2024      Chemistry      Component Value Date/Time   NA 140 04/14/2024 0820   K 4.3 04/14/2024 0820   CL 106 04/14/2024 0820   CO2 27 04/14/2024 0820   BUN 20 04/14/2024 0820   CREATININE 1.11 (H) 04/14/2024 0820   CREATININE 1.89 (H) 10/23/2018 1626      Component Value Date/Time   CALCIUM  9.2 04/14/2024 0820   ALKPHOS 58 04/14/2024 0820   AST 17 04/14/2024 0820   ALT 11 04/14/2024 0820   BILITOT 0.5 04/14/2024 0820       RADIOGRAPHIC STUDIES:  No results found.   ASSESSMENT/PLAN:  This is a very pleasant 73 year old female referred to the clinic for thrombocytosis She had molecular studies that showed DDX 41 has been associated with leukemia and MDS.    Dr. Marguerita Shih previously would not recommend starting her on any medication such as Hydrea for her elevated platelet count at this time unless her platelet count continues to elevate in the future.  However given that she is at high risk for leukemia or MDS, which she does not have any evidence of at this time, Dr. Marguerita Shih does recommend close surveillance and follow-up with the patient.    Her CBC is otherwise normal with slightly elevated platelet count today which is improved compared to prior but still elevated at 426k today.    She will continue on observation with repeat labs in 6 months.   She will continue taking a baby aspirin.   The gabapentin  her PCP prescribed has helped her hot flashes significantly.    The patient was advised to call immediately if she has any concerning symptoms in the interval. The patient voices understanding of current disease status and treatment options and is in agreement with the current care plan. All questions were answered. The patient knows to call the clinic with any problems, questions or concerns. We can certainly see the  patient much sooner if necessary   Orders Placed This Encounter  Procedures   CBC with Differential (Cancer Center Only)    Standing Status:   Future    Expected Date:   10/15/2024    Expiration Date:   04/14/2025   CMP (Cancer Center only)    Standing Status:   Future    Expected Date:   10/15/2024    Expiration Date:   04/14/2025    The total time spent in the appointment was 20-29 minutes  Salimah Martinovich L Joss Mcdill, PA-C 04/14/24

## 2024-04-10 ENCOUNTER — Other Ambulatory Visit: Payer: Self-pay | Admitting: Family Medicine

## 2024-04-14 ENCOUNTER — Inpatient Hospital Stay (HOSPITAL_BASED_OUTPATIENT_CLINIC_OR_DEPARTMENT_OTHER): Payer: Medicare Other | Admitting: Physician Assistant

## 2024-04-14 ENCOUNTER — Inpatient Hospital Stay: Payer: Medicare Other | Attending: Physician Assistant

## 2024-04-14 ENCOUNTER — Encounter (HOSPITAL_COMMUNITY): Payer: Self-pay

## 2024-04-14 VITALS — BP 144/98 | HR 81 | Temp 97.9°F | Resp 14 | Wt 180.5 lb

## 2024-04-14 DIAGNOSIS — D75839 Thrombocytosis, unspecified: Secondary | ICD-10-CM

## 2024-04-14 DIAGNOSIS — Z79899 Other long term (current) drug therapy: Secondary | ICD-10-CM | POA: Insufficient documentation

## 2024-04-14 LAB — CMP (CANCER CENTER ONLY)
ALT: 11 U/L (ref 0–44)
AST: 17 U/L (ref 15–41)
Albumin: 4.3 g/dL (ref 3.5–5.0)
Alkaline Phosphatase: 58 U/L (ref 38–126)
Anion gap: 7 (ref 5–15)
BUN: 20 mg/dL (ref 8–23)
CO2: 27 mmol/L (ref 22–32)
Calcium: 9.2 mg/dL (ref 8.9–10.3)
Chloride: 106 mmol/L (ref 98–111)
Creatinine: 1.11 mg/dL — ABNORMAL HIGH (ref 0.44–1.00)
GFR, Estimated: 52 mL/min — ABNORMAL LOW (ref >60)
Glucose, Bld: 127 mg/dL — ABNORMAL HIGH (ref 70–99)
Potassium: 4.3 mmol/L (ref 3.5–5.1)
Sodium: 140 mmol/L (ref 135–145)
Total Bilirubin: 0.5 mg/dL (ref 0.0–1.2)
Total Protein: 7 g/dL (ref 6.5–8.1)

## 2024-04-14 LAB — CBC WITH DIFFERENTIAL (CANCER CENTER ONLY)
Abs Immature Granulocytes: 0.03 10*3/uL (ref 0.00–0.07)
Basophils Absolute: 0.1 10*3/uL (ref 0.0–0.1)
Basophils Relative: 1 %
Eosinophils Absolute: 0.3 10*3/uL (ref 0.0–0.5)
Eosinophils Relative: 3 %
HCT: 41.4 % (ref 36.0–46.0)
Hemoglobin: 13.6 g/dL (ref 12.0–15.0)
Immature Granulocytes: 0 %
Lymphocytes Relative: 18 %
Lymphs Abs: 1.6 10*3/uL (ref 0.7–4.0)
MCH: 29.1 pg (ref 26.0–34.0)
MCHC: 32.9 g/dL (ref 30.0–36.0)
MCV: 88.5 fL (ref 80.0–100.0)
Monocytes Absolute: 0.5 10*3/uL (ref 0.1–1.0)
Monocytes Relative: 5 %
Neutro Abs: 6.5 10*3/uL (ref 1.7–7.7)
Neutrophils Relative %: 73 %
Platelet Count: 426 10*3/uL — ABNORMAL HIGH (ref 150–400)
RBC: 4.68 MIL/uL (ref 3.87–5.11)
RDW: 12.7 % (ref 11.5–15.5)
WBC Count: 8.9 10*3/uL (ref 4.0–10.5)
nRBC: 0 % (ref 0.0–0.2)

## 2024-04-21 ENCOUNTER — Ambulatory Visit
Admission: RE | Admit: 2024-04-21 | Discharge: 2024-04-21 | Disposition: A | Discharge status: Home / Self Care | Source: Ambulatory Visit | Attending: Family Medicine | Admitting: Family Medicine

## 2024-04-21 DIAGNOSIS — Z1231 Encounter for screening mammogram for malignant neoplasm of breast: Secondary | ICD-10-CM | POA: Diagnosis not present

## 2024-04-25 ENCOUNTER — Ambulatory Visit: Payer: Self-pay | Admitting: Family Medicine

## 2024-04-26 ENCOUNTER — Telehealth: Payer: Self-pay | Admitting: Family Medicine

## 2024-04-26 DIAGNOSIS — D75839 Thrombocytosis, unspecified: Secondary | ICD-10-CM

## 2024-04-26 DIAGNOSIS — E781 Pure hyperglyceridemia: Secondary | ICD-10-CM

## 2024-04-26 DIAGNOSIS — N1832 Chronic kidney disease, stage 3b: Secondary | ICD-10-CM

## 2024-04-26 DIAGNOSIS — R7303 Prediabetes: Secondary | ICD-10-CM

## 2024-04-26 DIAGNOSIS — E039 Hypothyroidism, unspecified: Secondary | ICD-10-CM

## 2024-04-26 DIAGNOSIS — I10 Essential (primary) hypertension: Secondary | ICD-10-CM

## 2024-04-26 NOTE — Telephone Encounter (Signed)
-----   Message from [name] sent at 04/23/2024  3:47 PM EDT ----- Regarding: Lab orders for, [name], 5.29.25 Patient is scheduled for CPX labs, please order future labs, Thanks , Anselmo Kings

## 2024-05-04 DIAGNOSIS — H524 Presbyopia: Secondary | ICD-10-CM | POA: Diagnosis not present

## 2024-05-06 ENCOUNTER — Ambulatory Visit: Payer: Self-pay | Admitting: Family Medicine

## 2024-05-06 ENCOUNTER — Other Ambulatory Visit (INDEPENDENT_AMBULATORY_CARE_PROVIDER_SITE_OTHER): Payer: Medicare Other

## 2024-05-06 DIAGNOSIS — N1832 Chronic kidney disease, stage 3b: Secondary | ICD-10-CM | POA: Diagnosis not present

## 2024-05-06 DIAGNOSIS — I10 Essential (primary) hypertension: Secondary | ICD-10-CM | POA: Diagnosis not present

## 2024-05-06 DIAGNOSIS — D75839 Thrombocytosis, unspecified: Secondary | ICD-10-CM

## 2024-05-06 DIAGNOSIS — E039 Hypothyroidism, unspecified: Secondary | ICD-10-CM | POA: Diagnosis not present

## 2024-05-06 DIAGNOSIS — E781 Pure hyperglyceridemia: Secondary | ICD-10-CM

## 2024-05-06 DIAGNOSIS — R7303 Prediabetes: Secondary | ICD-10-CM

## 2024-05-06 LAB — CBC WITH DIFFERENTIAL/PLATELET
Basophils Absolute: 0.1 10*3/uL (ref 0.0–0.1)
Basophils Relative: 1.1 % (ref 0.0–3.0)
Eosinophils Absolute: 0.4 10*3/uL (ref 0.0–0.7)
Eosinophils Relative: 4.4 % (ref 0.0–5.0)
HCT: 42.2 % (ref 36.0–46.0)
Hemoglobin: 14 g/dL (ref 12.0–15.0)
Lymphocytes Relative: 29.1 % (ref 12.0–46.0)
Lymphs Abs: 2.5 10*3/uL (ref 0.7–4.0)
MCHC: 33.1 g/dL (ref 30.0–36.0)
MCV: 87.2 fl (ref 78.0–100.0)
Monocytes Absolute: 0.5 10*3/uL (ref 0.1–1.0)
Monocytes Relative: 5.5 % (ref 3.0–12.0)
Neutro Abs: 5 10*3/uL (ref 1.4–7.7)
Neutrophils Relative %: 59.9 % (ref 43.0–77.0)
Platelets: 449 10*3/uL — ABNORMAL HIGH (ref 150.0–400.0)
RBC: 4.84 Mil/uL (ref 3.87–5.11)
RDW: 12.8 % (ref 11.5–15.5)
WBC: 8.4 10*3/uL (ref 4.0–10.5)

## 2024-05-06 LAB — TSH: TSH: 7.57 u[IU]/mL — ABNORMAL HIGH (ref 0.35–5.50)

## 2024-05-06 LAB — COMPREHENSIVE METABOLIC PANEL WITH GFR
ALT: 12 U/L (ref 0–35)
AST: 16 U/L (ref 0–37)
Albumin: 4.4 g/dL (ref 3.5–5.2)
Alkaline Phosphatase: 74 U/L (ref 39–117)
BUN: 19 mg/dL (ref 6–23)
CO2: 26 meq/L (ref 19–32)
Calcium: 9.6 mg/dL (ref 8.4–10.5)
Chloride: 105 meq/L (ref 96–112)
Creatinine, Ser: 1 mg/dL (ref 0.40–1.20)
GFR: 55.91 mL/min — ABNORMAL LOW (ref >60.00)
Glucose, Bld: 107 mg/dL — ABNORMAL HIGH (ref 70–99)
Potassium: 4 meq/L (ref 3.5–5.1)
Sodium: 140 meq/L (ref 135–145)
Total Bilirubin: 0.7 mg/dL (ref 0.2–1.2)
Total Protein: 6.8 g/dL (ref 6.0–8.3)

## 2024-05-06 LAB — LIPID PANEL
Cholesterol: 221 mg/dL — ABNORMAL HIGH (ref 0–200)
HDL: 61.6 mg/dL (ref >39.00)
LDL Cholesterol: 83 mg/dL (ref 0–99)
NonHDL: 159.68
Total CHOL/HDL Ratio: 4
Triglycerides: 381 mg/dL — ABNORMAL HIGH (ref 0.0–149.0)
VLDL: 76.2 mg/dL — ABNORMAL HIGH (ref 0.0–40.0)

## 2024-05-06 LAB — HEMOGLOBIN A1C: Hgb A1c MFr Bld: 5.8 % (ref 4.6–6.5)

## 2024-05-06 LAB — VITAMIN D 25 HYDROXY (VIT D DEFICIENCY, FRACTURES): VITD: 52.64 ng/mL (ref 30.00–100.00)

## 2024-05-11 ENCOUNTER — Ambulatory Visit (INDEPENDENT_AMBULATORY_CARE_PROVIDER_SITE_OTHER): Payer: Medicare Other

## 2024-05-11 VITALS — Ht 65.0 in | Wt 180.0 lb

## 2024-05-11 DIAGNOSIS — Z0000 Encounter for general adult medical examination without abnormal findings: Secondary | ICD-10-CM | POA: Diagnosis not present

## 2024-05-11 NOTE — Progress Notes (Signed)
 Subjective:   [name] is a 73 y.o. who presents for a Medicare Wellness preventive visit.  As a reminder, Annual Wellness Visits don't include a physical exam, and some assessments may be limited, especially if this visit is performed virtually. We may recommend an in-person follow-up visit with your provider if needed.  Visit Complete: Virtual I connected with  [name] on 05/11/24 by a audio enabled telemedicine application and verified that I am speaking with the correct person using two identifiers.  Patient Location: Home  Provider Location: Office/Clinic  I discussed the limitations of evaluation and management by telemedicine. The patient expressed understanding and agreed to proceed.  Vital Signs: Because this visit was a virtual/telehealth visit, some criteria may be missing or patient reported. Any vitals not documented were not able to be obtained and vitals that have been documented are patient reported.  VideoDeclined- This patient declined Librarian, academic. Therefore the visit was completed with audio only.  Persons Participating in Visit: Patient.  AWV Questionnaire: Yes: Patient Medicare AWV questionnaire was completed by the patient on 05/09/24; I have confirmed that all information answered by patient is correct and no changes since this date.  Cardiac Risk Factors include: advanced age (>28men, >83 women);dyslipidemia;hypertension     Objective:     Today's Vitals   05/11/24 1419  Weight: 180 lb (81.6 kg)  Height: 5\' 5"  (1.651 m)   Body mass index is 29.95 kg/m.     05/28/2023   11:33 AM 04/29/2022    8:25 AM 11/16/2021   10:20 AM 10/24/2021   10:11 AM 04/27/2021    9:51 AM 04/26/2020    9:46 AM 04/14/2019   10:41 AM  Advanced Directives  Does Patient Have a Medical Advance Directive? Yes Yes Yes Yes Yes Yes Yes  Type of Estate agent of [place];Living will Healthcare Power of  [place];Living will Healthcare Power of [place];Living will Healthcare Power of [place];Living will Healthcare Power of [place];Living will Healthcare Power of [place];Living will Healthcare Power of [place];Living will  Does patient want to make changes to medical advance directive?   No - Patient declined No - Patient declined     Copy of Healthcare Power of Attorney in Chart? No - copy requested No - copy requested No - copy requested Yes - validated most recent copy scanned in chart (See row information) No - copy requested Yes - validated most recent copy scanned in chart (See row information) Yes - validated most recent copy scanned in chart (See row information)    Current Medications (verified) Outpatient Encounter Medications as of 05/11/2024  Medication Sig   acetaminophen  (TYLENOL ) 500 MG tablet Take 1,000 mg by mouth every 6 (six) hours as needed for moderate pain.   amLODipine  (NORVASC ) 10 MG tablet TAKE 1 TABLET BY MOUTH EVERY DAY   aspirin EC 81 MG tablet Take 81 mg by mouth daily. Swallow whole.   Calcium  Carb-Cholecalciferol (CALCIUM  500/D PO) Take 1 tablet by mouth daily.   Cyanocobalamin  (B-12 PO) Take 1 capsule by mouth daily.   fenofibrate  (TRICOR ) 145 MG tablet Take 1 tablet (145 mg total) by mouth daily.   gabapentin  (NEURONTIN ) 300 MG capsule Take 1 capsule (300 mg total) by mouth 3 (three) times daily.   levothyroxine  (SYNTHROID ) 88 MCG tablet TAKE 1 TABLET BY MOUTH DAILY  BEFORE BREAKFAST   mirabegron  ER (MYRBETRIQ ) 50 MG TB24 tablet Take 1 tablet (50 mg total) by mouth daily.   montelukast  (  SINGULAIR ) 10 MG tablet TAKE 1 TABLET BY MOUTH DAILY   Multiple Vitamin (MULTIVITAMIN) tablet Take 1 tablet by mouth daily.   propranolol  ER (INDERAL  LA) 120 MG 24 hr capsule TAKE 1 CAPSULE BY MOUTH DAILY   SUMAtriptan  (IMITREX ) 100 MG tablet TAKE 1 TABLET FOR HEADACHE MAY REPEAT ONCE IN 2 HOURS IF NEEDED. MAXIMUM OF 2    TABLETS IN ONE DAY. (Patient taking differently: Take 100  mg by mouth as needed for migraine or headache. TAKE 100 mg TABLET FOR HEADACHE MAY REPEAT ONCE IN 2 HOURS IF NEEDED. MAXIMUM OF 200 mg    TABLETS IN ONE DAY.)   No facility-administered encounter medications on file as of 05/11/2024.    Allergies (verified) Contrast media [iodinated contrast media], Shellfish-derived products, and Septra  [sulfamethoxazole -trimethoprim ]   History: Past Medical History:  Diagnosis Date   Allergic rhinitis    Allergy    Anemia    b12 def.pt. unaware   Asthma    controlled w/ singulair    Cancer (HCC) April 2024   Skin Cancer - Nose   Herpes simplex without mention of complication    controlled w/ meds (no current fever blisters)   History of hiatal hernia    History of kidney stones    HLD (hyperlipidemia)    borderline-  no meds   HTN (hypertension)    echo- 2006   Hyperthyroidism hx ptu 2006   takes synthroid ; had Graves disease, was treated for that- now thyroid  doesn't work   Leukocytosis, unspecified hx of 10 yrs ago   no problem since   Migraine    Mixed incontinence    urge and stress incontinence   Neuromuscular disorder (HCC)    Renal cyst right    pt is unsure of this, has had kidney stones   Ureteral calculi right    s/p laser  litho w/ stone extraction 01-28-11   Past Surgical History:  Procedure Laterality Date   BIOPSY  10/16/2018   Procedure: BIOPSY;  Surgeon: Janel Medford, MD;  Location: [facility] ENDOSCOPY;  Service: Endoscopy;;   BREAST REDUCTION SURGERY  1994   CERVICAL FUSION  2000   fusion c4-6   CERVICAL FUSION  2005   fusion c6-7 and plate removal W0-9   COLONOSCOPY     CYSTOSCOPY W/ RETROGRADES  10/21/2011   Procedure: CYSTOSCOPY WITH RETROGRADE PYELOGRAM;  Surgeon: Edmund Gouge, MD;  Location: [facility] [place];  Service: Urology;  Laterality: Right;  CYSTOSCOPY RIGHT RETROGRADE, PYELOGRAM  URETEROSCOPY WITH HOLMIUM LASER AND STONE EXTRACTION   CYSTOSCOPY/RETROGRADE/URETEROSCOPY/STONE EXTRACTION  WITH BASKET  01/28/2011   right    CYSTOSCOPY/URETEROSCOPY/HOLMIUM LASER/STENT PLACEMENT Right 11/16/2021   Procedure: CYSTOSCOPY/RETROGRADE/URETEROSCOPY/HOLMIUM LASER/STENT PLACEMENT;  Surgeon: Lahoma Pigg, MD;  Location: [facility] ORS;  Service: Urology;  Laterality: Right;   ESOPHAGOGASTRODUODENOSCOPY (EGD) WITH PROPOFOL  N/A 10/16/2018   Procedure: ESOPHAGOGASTRODUODENOSCOPY (EGD) WITH PROPOFOL ;  Surgeon: Janel Medford, MD;  Location: [facility] ENDOSCOPY;  Service: Endoscopy;  Laterality: N/A;   MOHS SURGERY  04/2023   REDUCTION MAMMAPLASTY Bilateral 1995   SPINE SURGERY  2001   2005   TONSILLECTOMY  1960   VAGINAL HYSTERECTOMY  1987   fibroids/anemia   Family History  Problem Relation Age of [place]   Osteoporosis Mother    Diabetes Mother    Heart disease Father    Heart failure Father    Diabetes Other        Grandfather   Coronary artery disease Other  Grandmother   Breast cancer Neg Hx    Colon cancer Neg Hx    Esophageal cancer Neg Hx    Stomach cancer Neg Hx    Rectal cancer Neg Hx    Thyroid  disease Neg Hx    Social History   Socioeconomic History   Marital status: Married    Spouse name: Dufm Gibbon   Number of children: 1   Years of education: Not on file   Highest education level: Associate degree: occupational, Scientist, product/process development, or vocational program  Occupational History   Occupation: Press photographer: [employer]  Tobacco Use   Smoking status: Never   Smokeless tobacco: Never  Vaping Use   Vaping status: Never Used  Substance and Sexual Activity   Alcohol use: No    Alcohol/week: 0.0 standard drinks of alcohol   Drug use: No   Sexual activity: Yes  Other Topics Concern   Not on file  Social History Narrative   Married  51 yrs 2023.   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   Social Drivers of Health   Financial Resource Strain: Low Risk  (05/11/2024)   Overall Financial Resource Strain (CARDIA)    Difficulty of Paying Living  Expenses: Not hard at all  Food Insecurity: No Food Insecurity (05/11/2024)   Hunger Vital Sign    Worried About Running Out of Food in the Last Year: Never true    Ran Out of Food in the Last Year: Never true  Transportation Needs: No Transportation Needs (05/11/2024)   PRAPARE - Administrator, Civil Service (Medical): No    Lack of Transportation (Non-Medical): No  Physical Activity: Sufficiently Active (05/11/2024)   Exercise Vital Sign    Days of Exercise per Week: 5 days    Minutes of Exercise per Session: 30 min  Stress: No Stress Concern Present (05/11/2024)   [employer] of Occupational Health - Occupational Stress Questionnaire    Feeling of Stress : Not at all  Social Connections: Moderately Isolated (05/11/2024)   Social Connection and Isolation Panel [NHANES]    Frequency of Communication with Friends and Family: Twice a week    Frequency of Social Gatherings with Friends and Family: Once a week    Attends Religious Services: Never    Database administrator or Organizations: No    Attends Engineer, structural: Never    Marital Status: Married    Tobacco Counseling Counseling given: Not Answered    Clinical Intake:  Pre-visit preparation completed: Yes  Pain : No/denies pain     BMI - recorded: 29.95 Nutritional Status: BMI 25 -29 Overweight Nutritional Risks: None Diabetes: No  Lab Results  Component Value Date   HGBA1C 5.8 05/06/2024   HGBA1C 5.7 05/06/2023   HGBA1C 5.8 05/02/2022     How often do you need to have someone help you when you read instructions, pamphlets, or other written materials from your doctor or pharmacy?: 1 - Never  Interpreter Needed?: No      Activities of Daily Living     05/09/2024    8:55 AM 05/28/2023   11:34 AM  In your present state of health, do you have any difficulty performing the following activities:  Hearing? 0 0  Vision? 0 0  Difficulty concentrating or making decisions? 0 0  Walking or  climbing stairs? 0 0  Dressing or bathing? 0 0  Doing errands, shopping? 0 0  Preparing Food  and eating ? N N  Using the Toilet? N N  In the past six months, have you accidently leaked urine? N N  Do you have problems with loss of bowel control? N N  Managing your Medications? N N  Managing your Finances? N N  Housekeeping or managing your Housekeeping? N N    Patient Care Team: Tower, Manley Seeds, MD as PCP - General Adams, Carmelia China, [facility] (Inactive) as Pharmacist (Pharmacist) Nelva Bang, OD as Referring Physician (Optometry)  I have updated your Care Teams any recent Medical Services you may have received from other providers in the past year.     Assessment:    This is a routine wellness examination for Lashya.  Hearing/Vision screen No results found.   Goals Addressed   None    Depression Screen     05/11/2024    2:23 PM 02/11/2024   11:38 AM 12/31/2023   12:44 PM 10/31/2023    8:54 AM 08/19/2023   11:47 AM 05/28/2023   11:33 AM 05/12/2023    7:58 AM  PHQ 2/9 Scores  PHQ - 2 Score 0 2 0 0 0 0 0  PHQ- 9 Score  7 0 10 3      Fall Risk     05/09/2024    8:55 AM 02/11/2024   11:38 AM 12/31/2023   12:44 PM 10/31/2023    8:54 AM 08/19/2023   11:47 AM  Fall Risk   Falls in the past year? 0 1 0 0 0  Number falls in past yr: 0 1 0 0 0  Injury with Fall? 0 1 0 0 0  Risk for fall due to : No Fall Risks History of fall(s) No Fall Risks No Fall Risks No Fall Risks  Follow up Education provided;Falls prevention discussed Falls evaluation completed Falls evaluation completed Falls evaluation completed Falls evaluation completed    MEDICARE RISK AT HOME:  Medicare Risk at Home Any stairs in or around the home?: (Patient-Rptd) Yes If so, are there any without handrails?: (Patient-Rptd) Yes Home free of loose throw rugs in walkways, pet beds, electrical cords, etc?: (Patient-Rptd) Yes Adequate lighting in your home to reduce risk of falls?: (Patient-Rptd) Yes Life alert?:  (Patient-Rptd) No Use of a cane, walker or w/c?: (Patient-Rptd) No Grab bars in the bathroom?: (Patient-Rptd) No Shower chair or bench in shower?: (Patient-Rptd) Yes Elevated toilet seat or a handicapped toilet?: (Patient-Rptd) No  TIMED UP AND GO:  Was the test performed?  No  Cognitive Function: 6CIT completed    04/27/2021   10:00 AM 04/26/2020    9:50 AM 04/14/2019   10:51 AM 04/08/2018   10:00 AM 04/04/2017    9:32 AM  MMSE - Mini Mental State Exam  Orientation to time 5 5 5 5 5   Orientation to Place 5 5 5 5 5   Registration 3 3 3 3 3   Attention/ Calculation 5 5 0 0 0  Recall 3 3 3 3 3   Language- name 2 objects   0 0 0  Language- repeat 1 1 1 1 1   Language- follow 3 step command   0 3 3  Language- read & follow direction   0 0 0  Write a sentence   0 0 0  Copy design   0 0 0  Total score   17 20 20         05/11/2024    2:25 PM 05/28/2023   11:35 AM 04/29/2022    8:26 AM  6CIT Screen  What Year? 0 points 0 points 0 points  What month? 0 points 0 points 0 points  What time? 0 points 0 points 0 points  Count back from 20 0 points 0 points 0 points  Months in reverse 0 points 0 points 0 points  Repeat phrase 0 points 0 points 0 points  Total Score 0 points 0 points 0 points    Immunizations Immunization History  Administered Date(s) Administered   Fluad Quad(high Dose 65+) 08/26/2022   Influenza Split 08/10/2011   Influenza Whole 10/10/2007, 08/17/2008, 08/31/2009, 08/09/2010   Influenza, High Dose Seasonal PF 07/18/2017, 07/09/2018, 08/06/2019, 08/16/2020, 08/16/2020, 08/17/2020, 07/13/2021, 08/19/2023   Influenza,inj,Quad PF,6+ Mos 08/04/2015, 09/02/2016   Influenza-Unspecified 08/09/2014, 08/26/2022, 08/19/2023   PFIZER Comirnaty(Gray Top)Covid-19 Tri-Sucrose Vaccine 03/09/2021, 08/26/2022   PFIZER(Purple Top)SARS-COV-2 Vaccination 01/17/2020, 02/09/2020, 09/07/2020, 09/14/2021   PNEUMOCOCCAL CONJUGATE-20 05/24/2021   Pfizer Covid-19 Vaccine Bivalent Booster 34yrs  & up 09/14/2021, 08/26/2022, 08/19/2023   Pfizer(Comirnaty)Fall Seasonal Vaccine 12 years and older 08/26/2022, 08/19/2023   Pneumococcal Conjugate-13 11/28/2015   Pneumococcal Polysaccharide-23 08/25/2003, 10/10/2007   Pneumococcal-Unspecified 11/27/2016   Respiratory Syncytial Virus Vaccine,Recomb Aduvanted(Arexvy) 10/11/2022   Td 10/21/2006   Tdap 03/20/2015   Unspecified SARS-COV-2 Vaccination 04/24/2022   Zoster Recombinant(Shingrix) 09/04/2018, 12/09/2018, 12/10/2018   Zoster, Live 03/25/2012    Screening Tests Health Maintenance  Topic Date Due   COVID-19 Vaccine (9 - 2024-25 season) 11/01/2025 (Originally 10/14/2023)   INFLUENZA VACCINE  07/09/2024   DTaP/Tdap/Td (3 - Td or Tdap) 03/19/2025   MAMMOGRAM  04/21/2025   Medicare Annual Wellness (AWV)  05/11/2025   Colonoscopy  05/19/2028   Pneumonia Vaccine 46+ Years old  Completed   DEXA SCAN  Completed   Hepatitis C Screening  Completed   Zoster Vaccines- Shingrix  Completed   HPV VACCINES  Aged Out   Meningococcal B Vaccine  Aged Out    Health Maintenance  There are no preventive care reminders to display for this patient.  Health Maintenance Items Addressed: None needed at this time  Additional Screening:  Vision Screening: Recommended annual ophthalmology exams for early detection of glaucoma and other disorders of the eye. Would you like a referral to an eye doctor? No    Dental Screening: Recommended annual dental exams for proper oral hygiene  Community Resource Referral / Chronic Care Management: CRR required this visit?  No   CCM required this visit?  No   Plan:    I have personally reviewed and noted the following in the patient's chart:   Medical and social history Use of alcohol, tobacco or illicit drugs  Current medications and supplements including opioid prescriptions. Patient is not currently taking opioid prescriptions. Functional ability and status Nutritional status Physical  activity Advanced directives List of other physicians Hospitalizations, surgeries, and ER visits in previous 12 months Vitals Screenings to include cognitive, depression, and falls Referrals and appointments  In addition, I have reviewed and discussed with patient certain preventive protocols, quality metrics, and best practice recommendations. A written personalized care plan for preventive services as well as general preventive health recommendations were provided to patient.   Nerissa Bannister, LPN   4/0/9811   After Visit Summary: (MyChart) Due to this being a telephonic visit, the after visit summary with patients personalized plan was offered to patient via MyChart   Notes: Nothing significant to report at this time.

## 2024-05-11 NOTE — Patient Instructions (Signed)
 [name] , Thank you for taking time out of your busy schedule to complete your Annual Wellness Visit with me. I enjoyed our conversation and look forward to speaking with you again next year. I, as well as your care team,  appreciate your ongoing commitment to your health goals. Please review the following plan we discussed and let me know if I can assist you in the future. Your Game plan/ To Do List     Follow up Visits: Next Medicare AWV with our clinical staff: 05/12/25 @ 2:20pm televisit   Have you seen your provider in the last 6 months (3 months if uncontrolled diabetes)? Yes Next Office Visit with your provider: 05/13/24  Clinician Recommendations:  Aim for 30 minutes of exercise or brisk walking, 6-8 glasses of water, and 5 servings of fruits and vegetables each day.       This is a list of the screening recommended for you and due dates:  Health Maintenance  Topic Date Due   COVID-19 Vaccine (9 - 2024-25 season) 11/01/2025*   Flu Shot  07/09/2024   DTaP/Tdap/Td vaccine (3 - Td or Tdap) 03/19/2025   Mammogram  04/21/2025   Medicare Annual Wellness Visit  05/11/2025   Colon Cancer Screening  05/19/2028   Pneumonia Vaccine  Completed   DEXA scan (bone density measurement)  Completed   Hepatitis C Screening  Completed   Zoster (Shingles) Vaccine  Completed   HPV Vaccine  Aged Out   Meningitis B Vaccine  Aged Out  *Topic was postponed. The date shown is not the original due date.    Advanced directives: (In Chart) A copy of your advanced directives are scanned into your chart should your provider ever need it. Advance Care Planning is important because it:  [x]  Makes sure you receive the medical care that is consistent with your values, goals, and preferences  [x]  It provides guidance to your family and loved ones and reduces their decisional burden about whether or not they are making the right decisions based on your wishes.  Follow the link provided in your after visit  summary or read over the paperwork we have mailed to you to help you started getting your Advance Directives in place. If you need assistance in completing these, please reach out to us  so that we can help you!

## 2024-05-13 ENCOUNTER — Ambulatory Visit (INDEPENDENT_AMBULATORY_CARE_PROVIDER_SITE_OTHER): Payer: Medicare Other | Admitting: Family Medicine

## 2024-05-13 ENCOUNTER — Encounter: Payer: Self-pay | Admitting: Family Medicine

## 2024-05-13 VITALS — BP 138/80 | HR 80 | Temp 98.6°F | Ht 64.75 in | Wt 175.1 lb

## 2024-05-13 DIAGNOSIS — Z1211 Encounter for screening for malignant neoplasm of colon: Secondary | ICD-10-CM

## 2024-05-13 DIAGNOSIS — N1832 Chronic kidney disease, stage 3b: Secondary | ICD-10-CM | POA: Diagnosis not present

## 2024-05-13 DIAGNOSIS — R7303 Prediabetes: Secondary | ICD-10-CM

## 2024-05-13 DIAGNOSIS — M8580 Other specified disorders of bone density and structure, unspecified site: Secondary | ICD-10-CM | POA: Diagnosis not present

## 2024-05-13 DIAGNOSIS — R232 Flushing: Secondary | ICD-10-CM

## 2024-05-13 DIAGNOSIS — E781 Pure hyperglyceridemia: Secondary | ICD-10-CM | POA: Diagnosis not present

## 2024-05-13 DIAGNOSIS — Z0000 Encounter for general adult medical examination without abnormal findings: Secondary | ICD-10-CM

## 2024-05-13 DIAGNOSIS — I10 Essential (primary) hypertension: Secondary | ICD-10-CM

## 2024-05-13 DIAGNOSIS — E039 Hypothyroidism, unspecified: Secondary | ICD-10-CM

## 2024-05-13 DIAGNOSIS — D75839 Thrombocytosis, unspecified: Secondary | ICD-10-CM

## 2024-05-13 MED ORDER — FENOFIBRATE 160 MG PO TABS: 160.0000 mg | ORAL_TABLET | Freq: Every day | ORAL | 3 refills | Status: AC

## 2024-05-13 MED ORDER — LEVOTHYROXINE SODIUM 100 MCG PO TABS: 100.0000 ug | ORAL_TABLET | Freq: Every day | ORAL | 0 refills | Status: DC

## 2024-05-13 NOTE — Assessment & Plan Note (Signed)
 Disc goals for lipids and reasons to control them Rev last labs with pt Rev low sat fat diet in detail Despite fenofibrate  145 trig are up to 381   Will increase dose to 160   Also encouraged decrease in fat and sugar in diet (tricky as pt has lost sense of taste except for sweets and eats them)  Re check lab 6 weeks

## 2024-05-13 NOTE — Assessment & Plan Note (Signed)
 Colonoscopy 05/2018 with 10 y recall (if indicated at age)

## 2024-05-13 NOTE — Patient Instructions (Addendum)
 Go up on levothyroxine  to 100 mcg daily  If any problems or side effects, let us  know   Let's re check tsh in about 6 weeks   Try to get most of your carbohydrates from produce (with the exception of white potatoes) and whole grains Eat less bread/pasta/rice/snack foods/cereals/sweets and other items from the middle of the grocery store (processed carbs)  Go up on fenofibrate  to 160 mg daily  If any problems let us  know  We will re check cholesterol in 6 weeks also   I recommend covid shot yearly or more if recommended in season - at pharmacy Also flu shot every fall  RSV shot one time - at pharmacy   Keep taking care of yourself

## 2024-05-13 NOTE — Assessment & Plan Note (Signed)
 bp in fair control at this time  BP Readings from Last 1 Encounters:  05/13/24 138/80   No changes needed Most recent labs reviewed  Disc lifstyle change with low sodium diet and exercise  Blood pressure is always lower at home than here   Propranolol  ER 120 mg daily  Amlodipine  5 mg daily

## 2024-05-13 NOTE — Assessment & Plan Note (Signed)
 Monitored by hematology Last plt was 449  No symptoms  Monitoring for possible MDS in future- will continue heme care

## 2024-05-13 NOTE — Assessment & Plan Note (Signed)
 Dexa 10/2023 No falls or fractures D level in normal range Exercising more   Discussed fall prevention, supplements and exercise for bone density   Plan on 2 y dexa

## 2024-05-13 NOTE — Assessment & Plan Note (Signed)
 Reviewed health habits including diet and exercise and skin cancer prevention Reviewed appropriate screening tests for age  Also reviewed health mt list, fam hx and immunization status , as well as social and family history   See HPI Labs reviewed and ordered Health Maintenance  Topic Date Due   COVID-19 Vaccine (9 - 2024-25 season) 11/01/2025*   Flu Shot  07/09/2024   DTaP/Tdap/Td vaccine (3 - Td or Tdap) 03/19/2025   Mammogram  04/21/2025   Medicare Annual Wellness Visit  05/11/2025   Colon Cancer Screening  05/19/2028   Pneumonia Vaccine  Completed   DEXA scan (bone density measurement)  Completed   Hepatitis C Screening  Completed   Zoster (Shingles) Vaccine  Completed   HPV Vaccine  Aged Out   Meningitis B Vaccine  Aged Out  *Topic was postponed. The date shown is not the original due date.    Discussed fall prevention, supplements and exercise for bone density  PHQ 0

## 2024-05-13 NOTE — Progress Notes (Signed)
 Subjective:    Patient ID: [name], female    DOB: November 22, 1951, 73 y.o.   MRN: [id number]  HPI  Here for health maintenance exam and to review chronic medical problems   Wt Readings from Last 3 Encounters:  05/13/24 175 lb 2 oz (79.4 kg)  05/11/24 180 lb (81.6 kg)  04/14/24 180 lb 8 oz (81.9 kg)   29.37 kg/m  Vitals:   05/13/24 0918 05/13/24 0945  BP: (!) 142/78 138/80  Pulse: 80   Temp: 98.6 F (37 C)   SpO2: 97%     Immunization History  Administered Date(s) Administered   Fluad Quad(high Dose 65+) 08/26/2022   Influenza Split 08/10/2011   Influenza Whole 10/10/2007, 08/17/2008, 08/31/2009, 08/09/2010   Influenza, High Dose Seasonal PF 07/18/2017, 07/09/2018, 08/06/2019, 08/16/2020, 08/16/2020, 08/17/2020, 07/13/2021, 08/19/2023   Influenza,inj,Quad PF,6+ Mos 08/04/2015, 09/02/2016   Influenza-Unspecified 08/09/2014, 08/26/2022, 08/19/2023   PFIZER Comirnaty(Gray Top)Covid-19 Tri-Sucrose Vaccine 03/09/2021, 08/26/2022   PFIZER(Purple Top)SARS-COV-2 Vaccination 01/17/2020, 02/09/2020, 09/07/2020, 09/14/2021   PNEUMOCOCCAL CONJUGATE-20 05/24/2021   Pfizer Covid-19 Vaccine Bivalent Booster 48yrs & up 09/14/2021, 08/26/2022, 08/19/2023   Pfizer(Comirnaty)Fall Seasonal Vaccine 12 years and older 08/26/2022, 08/19/2023   Pneumococcal Conjugate-13 11/28/2015   Pneumococcal Polysaccharide-23 08/25/2003, 10/10/2007   Pneumococcal-Unspecified 11/27/2016   Respiratory Syncytial Virus Vaccine,Recomb Aduvanted(Arexvy) 10/11/2022   Td 10/21/2006   Tdap 03/20/2015   Unspecified SARS-COV-2 Vaccination 04/24/2022   Zoster Recombinant(Shingrix) 09/04/2018, 12/09/2018, 12/10/2018   Zoster, Live 03/25/2012    There are no preventive care reminders to display for this patient.   Mammogram 04/2024  Self breast exam- no lumps   Gyn health No problems    Colon cancer screening   colonoscopy 05/2018 with 10 y recall   Bone health  Dexa 10/2023  osteopenia  Falls-none   Fractures-none  Supplements  Last vitamin D  Lab Results  Component Value Date   VD25OH 52.64 05/06/2024    Exercise  Walking  Uses rebounder inside  Also strength training at home   Trying to eat better and weight is down 5 lb     Mood    05/13/2024    9:25 AM 05/11/2024    2:23 PM 02/11/2024   11:38 AM 12/31/2023   12:44 PM 10/31/2023    8:54 AM  Depression screen PHQ 2/9  Decreased Interest 0 0 2 0 0  Down, Depressed, Hopeless 0 0 0 0 0  PHQ - 2 Score 0 0 2 0 0  Altered sleeping 0  0 0 0  Tired, decreased energy 0  2 0 3  Change in appetite 0  2 0 3  Feeling bad or failure about yourself  0  0 0 0  Trouble concentrating 0  1 0 3  Moving slowly or fidgety/restless 0  0 0 1  Suicidal thoughts 0  0 0 0  PHQ-9 Score 0  7 0 10  Difficult doing work/chores Not difficult at all  Not difficult at all Not difficult at all Very difficult   HTN bp is stable today  No cp or palpitations or headaches or edema  No side effects to medicines  BP Readings from Last 3 Encounters:  05/13/24 138/80  04/14/24 (!) 144/98  02/19/24 110/78    Propranolol  ER 120 mg daily  Amlodipine  5 mg daily   Runs lower at home  Some white coat syndrome   Sees nephrology for ckd  Lab Results  Component Value Date   NA 140 05/06/2024   K 4.0 05/06/2024  CO2 26 05/06/2024   GLUCOSE 107 (H) 05/06/2024   BUN 19 05/06/2024   CREATININE 1.00 05/06/2024   CALCIUM  9.6 05/06/2024   GFR 55.91 (L) 05/06/2024   GFRNONAA 52 (L) 04/14/2024   Doing well with water intake  Lab Results  Component Value Date   WBC 8.4 05/06/2024   HGB 14.0 05/06/2024   HCT 42.2 05/06/2024   MCV 87.2 05/06/2024   PLT 449.0 (H) 05/06/2024   Under care of hematology for thrombocytosis -being monitored/ovs for MDS    Pulse Readings from Last 3 Encounters:  05/13/24 80  04/14/24 81  02/19/24 83   Hypothyroidism  Pt has no clinical changes No change in energy level/ hair or skin/ edema and no tremor Lab Results   Component Value Date   TSH 7.57 (H) 05/06/2024    Similar to last visit  At that time we held off on increase levothyroxine  due to her sweats Sweats are better   Is interested in increase the levothyroxine    Takes 88 mcg daily now    Hyperlipidemia Lab Results  Component Value Date   CHOL 221 (H) 05/06/2024   CHOL 189 09/03/2023   CHOL 189 06/16/2023   Lab Results  Component Value Date   HDL 61.60 05/06/2024   HDL 67.30 09/03/2023   HDL 63.80 06/16/2023   Lab Results  Component Value Date   LDLCALC 83 05/06/2024   LDLCALC 68 09/03/2023   LDLCALC 68 06/01/2020   Lab Results  Component Value Date   TRIG 381.0 (H) 05/06/2024   TRIG 270.0 (H) 09/03/2023   TRIG 235.0 (H) 06/16/2023   Lab Results  Component Value Date   CHOLHDL 4 05/06/2024   CHOLHDL 3 09/03/2023   CHOLHDL 3 06/16/2023   Lab Results  Component Value Date   LDLDIRECT 88.0 06/16/2023   LDLDIRECT 102.0 05/06/2023   LDLDIRECT 104.0 05/02/2022   Triglycerides are up tricor  145 mg daily No missed doses  Sugar intake may affect this   Prediabetes Lab Results  Component Value Date   HGBA1C 5.8 05/06/2024   HGBA1C 5.7 05/06/2023   HGBA1C 5.8 05/02/2022   Is big on sweets (especially with loss of taste from covid)   Gabapentin  for menopausal night sweats  Had to go up on it  Tolerates well  Much much better       Patient Active Problem List   Diagnosis Date Noted   Snoring 10/31/2023   Chronic cough 10/31/2023   Osteopenia 10/19/2023   Hot flashes 10/17/2023   Graves disease 10/17/2023   Stage 3b chronic kidney disease (HCC) 05/12/2023   Hemorrhoids 10/23/2018   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    Estrogen deficiency 04/15/2018   Bladder prolapse, female, 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   Colon cancer screening 04/09/2017   Prediabetes 03/30/2017   Medicare annual wellness visit, initial 04/04/2016   Other screening mammogram 12/31/2011   Post-menopausal 12/31/2011   Routine general  medical examination at a health care facility 12/23/2011   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   Hypothyroidism 11/08/2008   HSV (herpes simplex virus) infection 09/23/2007   Hypertriglyceridemia 09/23/2007   Essential hypertension 09/23/2007   Allergic rhinitis 09/23/2007   Asthma 09/23/2007   HYPOKALEMIA 04/16/2007   Thrombocytosis (HCC) 04/09/2007   Past Medical History:  Diagnosis Date   Allergic rhinitis    Allergy    Anemia    b12 def.pt. unaware   Asthma    controlled w/ singulair    Cancer [facility]) April 2024  Skin Cancer - Nose   Herpes simplex without mention of complication    controlled w/ meds (no current fever blisters)   History of hiatal hernia    History of kidney stones    HLD (hyperlipidemia)    borderline-  no meds   HTN (hypertension)    echo- 2006   Hyperthyroidism hx ptu 2006   takes synthroid ; had Graves disease, was treated for that- now thyroid  doesn't work   Leukocytosis, unspecified hx of 10 yrs ago   no problem since   Migraine    Mixed incontinence    urge and stress incontinence   Neuromuscular disorder (HCC)    Renal cyst right    pt is unsure of this, has had kidney stones   Ureteral calculi right    s/p laser  litho w/ stone extraction 01-28-11   Past Surgical History:  Procedure Laterality Date   BIOPSY  10/16/2018   Procedure: BIOPSY;  Surgeon: Janel Medford, MD;  Location: [facility] ENDOSCOPY;  Service: Endoscopy;;   BREAST REDUCTION SURGERY  1994   CERVICAL FUSION  2000   fusion c4-6   CERVICAL FUSION  2005   fusion c6-7 and plate removal W0-9   COLONOSCOPY     CYSTOSCOPY W/ RETROGRADES  10/21/2011   Procedure: CYSTOSCOPY WITH RETROGRADE PYELOGRAM;  Surgeon: Edmund Gouge, MD;  Location: [facility] [place];  Service: Urology;  Laterality: Right;  CYSTOSCOPY RIGHT RETROGRADE, PYELOGRAM  URETEROSCOPY WITH HOLMIUM LASER AND STONE EXTRACTION   CYSTOSCOPY/RETROGRADE/URETEROSCOPY/STONE EXTRACTION WITH BASKET   01/28/2011   right    CYSTOSCOPY/URETEROSCOPY/HOLMIUM LASER/STENT PLACEMENT Right 11/16/2021   Procedure: CYSTOSCOPY/RETROGRADE/URETEROSCOPY/HOLMIUM LASER/STENT PLACEMENT;  Surgeon: Lahoma Pigg, MD;  Location: [facility] ORS;  Service: Urology;  Laterality: Right;   ESOPHAGOGASTRODUODENOSCOPY (EGD) WITH PROPOFOL  N/A 10/16/2018   Procedure: ESOPHAGOGASTRODUODENOSCOPY (EGD) WITH PROPOFOL ;  Surgeon: Janel Medford, MD;  Location: [facility] ENDOSCOPY;  Service: Endoscopy;  Laterality: N/A;   MOHS SURGERY  04/2023   REDUCTION MAMMAPLASTY Bilateral 1995   SPINE SURGERY  2001   2005   TONSILLECTOMY  1960   VAGINAL HYSTERECTOMY  1987   fibroids/anemia   Social History   Tobacco Use   Smoking status: Never   Smokeless tobacco: Never  Vaping Use   Vaping status: Never Used  Substance Use Topics   Alcohol use: No   Drug use: No   Family History  Problem Relation Age of Onset   Osteoporosis Mother    Diabetes Mother    Heart disease Father    Heart failure Father    Diabetes Other        Grandfather   Coronary artery disease Other        Grandmother   Breast cancer Neg Hx    Colon cancer Neg Hx    Esophageal cancer Neg Hx    Stomach cancer Neg Hx    Rectal cancer Neg Hx    Thyroid  disease Neg Hx    Allergies  Allergen Reactions   Contrast Media [Iodinated Contrast Media] Anaphylaxis   Shellfish-Derived Products Anaphylaxis   Septra  [Sulfamethoxazole -Trimethoprim ] Nausea Only   Current Outpatient Medications on File Prior to Visit  Medication Sig Dispense Refill   acetaminophen  (TYLENOL ) 500 MG tablet Take 1,000 mg by mouth every 6 (six) hours as needed for moderate pain.     amLODipine  (NORVASC ) 10 MG tablet TAKE 1 TABLET BY MOUTH EVERY DAY 90 tablet 2   aspirin EC 81 MG tablet Take 81 mg by mouth daily. Swallow  whole.     Calcium  Carb-Cholecalciferol (CALCIUM  500/D PO) Take 1 tablet by mouth daily.     Cyanocobalamin  (B-12 PO) Take 1 capsule by mouth daily.     gabapentin   (NEURONTIN ) 300 MG capsule Take 1 capsule (300 mg total) by mouth 3 (three) times daily. 90 capsule 2   mirabegron  ER (MYRBETRIQ ) 50 MG TB24 tablet Take 1 tablet (50 mg total) by mouth daily. 90 tablet 1   montelukast  (SINGULAIR ) 10 MG tablet TAKE 1 TABLET BY MOUTH DAILY 90 tablet 0   Multiple Vitamin (MULTIVITAMIN) tablet Take 1 tablet by mouth daily.     propranolol  ER (INDERAL  LA) 120 MG 24 hr capsule TAKE 1 CAPSULE BY MOUTH DAILY 90 capsule 0   SUMAtriptan  (IMITREX ) 100 MG tablet TAKE 1 TABLET FOR HEADACHE MAY REPEAT ONCE IN 2 HOURS IF NEEDED. MAXIMUM OF 2    TABLETS IN ONE DAY. (Patient taking differently: Take 100 mg by mouth as needed for migraine or headache. TAKE 100 mg TABLET FOR HEADACHE MAY REPEAT ONCE IN 2 HOURS IF NEEDED. MAXIMUM OF 200 mg    TABLETS IN ONE DAY.) 27 tablet 3   No current facility-administered medications on file prior to visit.    Review of Systems  Constitutional:  Positive for fatigue. Negative for activity change, appetite change, fever and unexpected weight change.  HENT:  Negative for congestion, ear pain, rhinorrhea, sinus pressure and sore throat.   Eyes:  Negative for pain, redness and visual disturbance.  Respiratory:  Negative for cough, shortness of breath and wheezing.   Cardiovascular:  Negative for chest pain and palpitations.  Gastrointestinal:  Negative for abdominal pain, blood in stool, constipation and diarrhea.  Endocrine: Negative for polydipsia and polyuria.  Genitourinary:  Negative for dysuria, frequency and urgency.  Musculoskeletal:  Negative for arthralgias, back pain and myalgias.  Skin:  Negative for pallor and rash.  Allergic/Immunologic: Negative for environmental allergies.  Neurological:  Negative for dizziness, syncope and headaches.  Hematological:  Negative for adenopathy. Does not bruise/bleed easily.  Psychiatric/Behavioral:  Negative for decreased concentration and dysphoric mood. The patient is not nervous/anxious.         Objective:   Physical Exam Constitutional:      General: She is not in acute distress.    Appearance: Normal appearance. She is well-developed. She is not ill-appearing or diaphoretic.     Comments: Overweight   HENT:     Head: Normocephalic and atraumatic.     Right Ear: Tympanic membrane, ear canal and external ear normal.     Left Ear: Tympanic membrane, ear canal and external ear normal.     Nose: Nose normal. No congestion.     Mouth/Throat:     Mouth: Mucous membranes are moist.     Pharynx: Oropharynx is clear. No posterior oropharyngeal erythema.  Eyes:     General: No scleral icterus.    Extraocular Movements: Extraocular movements intact.     Conjunctiva/sclera: Conjunctivae normal.     Pupils: Pupils are equal, round, and reactive to light.  Neck:     Thyroid : No thyromegaly.     Vascular: No carotid bruit or JVD.  Cardiovascular:     Rate and Rhythm: Normal rate and regular rhythm.     Pulses: Normal pulses.     Heart sounds: Normal heart sounds.     No gallop.  Pulmonary:     Effort: Pulmonary effort is normal. No respiratory distress.     Breath sounds: Normal breath  sounds. No wheezing.     Comments: Good air exch Chest:     Chest wall: No tenderness.  Abdominal:     General: Bowel sounds are normal. There is no distension or abdominal bruit.     Palpations: Abdomen is soft. There is no mass.     Tenderness: There is no abdominal tenderness.     Hernia: No hernia is present.  Genitourinary:    Comments: Breast exam: No mass, nodules, thickening, tenderness, bulging, retraction, inflamation, nipple discharge or skin changes noted.  No axillary or clavicular LA.     Musculoskeletal:        General: No tenderness. Normal range of motion.     Cervical back: Normal range of motion and neck supple. No rigidity. No muscular tenderness.     Right lower leg: No edema.     Left lower leg: No edema.     Comments: No kyphosis   Lymphadenopathy:     Cervical: No  cervical adenopathy.  Skin:    General: Skin is warm and dry.     Coloration: Skin is not pale.     Findings: No erythema or rash.     Comments: Very fair  Some scattered lentigines and small brown nevi  Neurological:     Mental Status: She is alert. Mental status is at baseline.     Cranial Nerves: No cranial nerve deficit.     Motor: No abnormal muscle tone.     Coordination: Coordination normal.     Gait: Gait normal.     Deep Tendon Reflexes: Reflexes are normal and symmetric. Reflexes normal.  Psychiatric:        Mood and Affect: Mood normal.        Cognition and Memory: Cognition and memory normal.           Assessment & Plan:   Problem List Items Addressed This Visit       Cardiovascular and Mediastinum   Hot flashes   Much improved with gabapentin  300 mg tid  Tolerating well  Will continue to monitor       Relevant Medications   fenofibrate  160 MG tablet   Essential hypertension   bp in fair control at this time  BP Readings from Last 1 Encounters:  05/13/24 138/80   No changes needed Most recent labs reviewed  Disc lifstyle change with low sodium diet and exercise  Blood pressure is always lower at home than here   Propranolol  ER 120 mg daily  Amlodipine  5 mg daily         Relevant Medications   fenofibrate  160 MG tablet     Endocrine   Hypothyroidism   Lab Results  Component Value Date   TSH 7.57 (H) 05/06/2024   No longer has menopausal sweats and would like to increase thyroid  dose Will increase levothyroxine  from 88 to 100 mcg daily  Re check tsh in 6 weeks       Relevant Medications   levothyroxine  (SYNTHROID ) 100 MCG tablet   Other Relevant Orders   TSH     Musculoskeletal and Integument   Osteopenia   Dexa 10/2023 No falls or fractures D level in normal range Exercising more   Discussed fall prevention, supplements and exercise for bone density   Plan on 2 y dexa         Genitourinary   Stage 3b chronic kidney  disease (HCC)   Sees nephrology  GFR is improved at 55.91  Good water  intake  Will continue to monitor         Hematopoietic and Hemostatic   Thrombocytosis (HCC)   Monitored by hematology Last plt was 449  No symptoms  Monitoring for possible MDS in future- will continue heme care         Other   Routine general medical examination at a health care facility - Primary   Reviewed health habits including diet and exercise and skin cancer prevention Reviewed appropriate screening tests for age  Also reviewed health mt list, fam hx and immunization status , as well as social and family history   See HPI Labs reviewed and ordered Health Maintenance  Topic Date Due   COVID-19 Vaccine (9 - 2024-25 season) 11/01/2025*   Flu Shot  07/09/2024   DTaP/Tdap/Td vaccine (3 - Td or Tdap) 03/19/2025   Mammogram  04/21/2025   Medicare Annual Wellness Visit  05/11/2025   Colon Cancer Screening  05/19/2028   Pneumonia Vaccine  Completed   DEXA scan (bone density measurement)  Completed   Hepatitis C Screening  Completed   Zoster (Shingles) Vaccine  Completed   HPV Vaccine  Aged Out   Meningitis B Vaccine  Aged Out  *Topic was postponed. The date shown is not the original due date.    Discussed fall prevention, supplements and exercise for bone density  PHQ 0       Prediabetes   Lab Results  Component Value Date   HGBA1C 5.8 05/06/2024   HGBA1C 5.7 05/06/2023   HGBA1C 5.8 05/02/2022   disc imp of low glycemic diet and wt loss to prevent DM2  Pt lost sense of taste with covid and can only taste sweet things-this is a challenge Encouraged to eat fruit instead of sweets when able      Hypertriglyceridemia   Disc goals for lipids and reasons to control them Rev last labs with pt Rev low sat fat diet in detail Despite fenofibrate  145 trig are up to 381   Will increase dose to 160   Also encouraged decrease in fat and sugar in diet (tricky as pt has lost sense of taste except  for sweets and eats them)  Re check lab 6 weeks       Relevant Medications   fenofibrate  160 MG tablet   Other Relevant Orders   Lipid panel   ALT   AST   Colon cancer screening   Colonoscopy 05/2018 with 10 y recall (if indicated at age)

## 2024-05-13 NOTE — Assessment & Plan Note (Signed)
 Much improved with gabapentin  300 mg tid  Tolerating well  Will continue to monitor

## 2024-05-13 NOTE — Assessment & Plan Note (Signed)
 Lab Results  Component Value Date   TSH 7.57 (H) 05/06/2024   No longer has menopausal sweats and would like to increase thyroid  dose Will increase levothyroxine  from 88 to 100 mcg daily  Re check tsh in 6 weeks

## 2024-05-13 NOTE — Assessment & Plan Note (Signed)
 Sees nephrology  GFR is improved at 55.91  Good water intake  Will continue to monitor

## 2024-05-13 NOTE — Assessment & Plan Note (Signed)
 Lab Results  Component Value Date   HGBA1C 5.8 05/06/2024   HGBA1C 5.7 05/06/2023   HGBA1C 5.8 05/02/2022   disc imp of low glycemic diet and wt loss to prevent DM2  Pt lost sense of taste with covid and can only taste sweet things-this is a challenge Encouraged to eat fruit instead of sweets when able

## 2024-05-17 ENCOUNTER — Other Ambulatory Visit: Payer: Self-pay | Admitting: Family Medicine

## 2024-05-18 NOTE — Telephone Encounter (Signed)
 Refill asking for Rx to be sent to mail order pharmacy. Last filled to local pharmacy on 02/19/24 #90 caps/ 2 refills  CPE was on 05/13/24, please advise

## 2024-06-09 ENCOUNTER — Other Ambulatory Visit: Payer: Self-pay | Admitting: Family Medicine

## 2024-06-18 ENCOUNTER — Encounter: Payer: Self-pay | Admitting: Family Medicine

## 2024-06-18 MED ORDER — MONTELUKAST SODIUM 10 MG PO TABS: 10.0000 mg | ORAL_TABLET | Freq: Every day | ORAL | 2 refills | Status: DC

## 2024-06-18 MED ORDER — PROPRANOLOL HCL ER 120 MG PO CP24: 120.0000 mg | ORAL_CAPSULE | Freq: Every day | ORAL | 2 refills | Status: DC

## 2024-06-18 MED ORDER — MIRABEGRON ER 50 MG PO TB24: 50.0000 mg | ORAL_TABLET | Freq: Every day | ORAL | 2 refills | Status: DC

## 2024-06-24 ENCOUNTER — Ambulatory Visit: Payer: Self-pay | Admitting: Family Medicine

## 2024-06-24 ENCOUNTER — Other Ambulatory Visit (INDEPENDENT_AMBULATORY_CARE_PROVIDER_SITE_OTHER)

## 2024-06-24 DIAGNOSIS — E781 Pure hyperglyceridemia: Secondary | ICD-10-CM

## 2024-06-24 DIAGNOSIS — E039 Hypothyroidism, unspecified: Secondary | ICD-10-CM

## 2024-06-24 LAB — LIPID PANEL
Cholesterol: 180 mg/dL (ref 0–200)
HDL: 63 mg/dL (ref >39.00)
LDL Cholesterol: 86 mg/dL (ref 0–99)
NonHDL: 117.32
Total CHOL/HDL Ratio: 3
Triglycerides: 158 mg/dL — ABNORMAL HIGH (ref 0.0–149.0)
VLDL: 31.6 mg/dL (ref 0.0–40.0)

## 2024-06-24 LAB — ALT: ALT: 10 U/L (ref 0–35)

## 2024-06-24 LAB — AST: AST: 14 U/L (ref 0–37)

## 2024-06-24 LAB — TSH: TSH: 4.39 u[IU]/mL (ref 0.35–5.50)

## 2024-07-07 ENCOUNTER — Other Ambulatory Visit: Payer: Self-pay | Admitting: Family Medicine

## 2024-07-22 ENCOUNTER — Other Ambulatory Visit: Payer: Self-pay | Admitting: Family Medicine

## 2024-07-23 DIAGNOSIS — N200 Calculus of kidney: Secondary | ICD-10-CM | POA: Diagnosis not present

## 2024-07-23 DIAGNOSIS — I129 Hypertensive chronic kidney disease with stage 1 through stage 4 chronic kidney disease, or unspecified chronic kidney disease: Secondary | ICD-10-CM | POA: Diagnosis not present

## 2024-07-23 DIAGNOSIS — N1832 Chronic kidney disease, stage 3b: Secondary | ICD-10-CM | POA: Diagnosis not present

## 2024-07-24 LAB — LAB REPORT - SCANNED
Creatinine, POC: 54.2 mg/dL
EGFR: 55

## 2024-08-04 ENCOUNTER — Other Ambulatory Visit: Payer: Self-pay | Admitting: Family Medicine

## 2024-08-04 ENCOUNTER — Encounter: Payer: Self-pay | Admitting: Family Medicine

## 2024-08-18 DIAGNOSIS — H6123 Impacted cerumen, bilateral: Secondary | ICD-10-CM | POA: Diagnosis not present

## 2024-08-18 DIAGNOSIS — H903 Sensorineural hearing loss, bilateral: Secondary | ICD-10-CM | POA: Diagnosis not present

## 2024-08-19 ENCOUNTER — Encounter: Payer: Self-pay | Admitting: Family Medicine

## 2024-08-19 DIAGNOSIS — Z23 Encounter for immunization: Secondary | ICD-10-CM

## 2024-08-19 MED ORDER — COVID-19 MRNA VAC-TRIS(PFIZER) 30 MCG/0.3ML IM SUSY: 0.3000 mL | PREFILLED_SYRINGE | Freq: Once | INTRAMUSCULAR | 0 refills | Status: AC

## 2024-08-19 NOTE — Telephone Encounter (Signed)
 Updated flu and PNA vaccines in pt's chart.   Pended Covid rx and fwd to Dr Randeen to authorize.

## 2024-09-02 DIAGNOSIS — R3121 Asymptomatic microscopic hematuria: Secondary | ICD-10-CM | POA: Diagnosis not present

## 2024-09-02 DIAGNOSIS — N261 Atrophy of kidney (terminal): Secondary | ICD-10-CM | POA: Diagnosis not present

## 2024-09-02 DIAGNOSIS — N1330 Unspecified hydronephrosis: Secondary | ICD-10-CM | POA: Diagnosis not present

## 2024-09-02 DIAGNOSIS — Z87442 Personal history of urinary calculi: Secondary | ICD-10-CM | POA: Diagnosis not present

## 2024-09-15 ENCOUNTER — Encounter: Payer: Self-pay | Admitting: Family Medicine

## 2024-10-07 NOTE — Progress Notes (Signed)
 [facility] OFFICE PROGRESS NOTE  Tower, Laine LABOR, MD [address] 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 female returns to the clinic today for a follow up visit. The patient was seen in the clinic to establish care in October 2024. She was referred for thrombocytosis which had been occurring since at least 2008 (earliest lab records). Therefore, we recommended Jak 2 mutation testing to assess for myeloproliferative disorder. At her last appointment, she was endorsing feeling well except worsening hot flashes and memory concerns.   She has been off hormone replacement therapy for several years due to a kidney stone complication and subsequent loss of one kidney.    She saw her PCP for hot flashes and short term memory issues. She is being trailed on gabapentin . Her hot flashes resolved with gabapentin  300 mg. This has helped her significantly.    She denies any recent infections, unusual lymph node enlargement, fever, nausea, vomiting, abdominal pain, or fullness. She also denies any abnormal bleeding or bruising.   She is taking aspirin. She denies fever. She denies recent steroids or hormone use.  She denies history of DVT, MI, PE, or CVA. She is here for evaluation and repeat blood work. SABRA   MEDICAL HISTORY: Past Medical History:  Diagnosis Date   Allergic rhinitis    Allergy    Anemia    b12 def.pt. unaware   Asthma    controlled w/ singulair    Cancer [facility]) April 2024   Skin Cancer - Nose   Herpes simplex without mention of complication    controlled w/ meds (no current fever blisters)   History of hiatal hernia    History of kidney stones    HLD (hyperlipidemia)    borderline-  no meds   HTN (hypertension)    echo- 2006   Hyperthyroidism hx ptu 2006    takes synthroid ; had Graves disease, was treated for that- now thyroid  doesn't work   Leukocytosis, unspecified hx of 10 yrs ago   no problem since   Migraine    Mixed incontinence    urge and stress incontinence   Neuromuscular disorder (HCC)    Renal cyst right    pt is unsure of this, has had kidney stones   Ureteral calculi right    s/p laser  litho w/ stone extraction 01-28-11    ALLERGIES:  is allergic to contrast media [iodinated contrast media], shellfish protein-containing drug products, and septra  [sulfamethoxazole -trimethoprim ].  MEDICATIONS:  Current Outpatient Medications  Medication Sig Dispense Refill   acetaminophen  (TYLENOL ) 500 MG tablet Take 1,000 mg by mouth every 6 (six) hours as needed for moderate pain.     amLODipine  (NORVASC ) 10 MG tablet TAKE 1 TABLET BY MOUTH EVERY DAY 90 tablet 2   aspirin EC 81 MG tablet Take 81 mg by mouth daily. Swallow whole.     Calcium  Carb-Cholecalciferol (CALCIUM  500/D PO) Take 1 tablet by mouth daily.     Cyanocobalamin  (B-12 PO) Take 1 capsule by mouth daily.     fenofibrate  160 MG tablet Take 1 tablet (160 mg total) by mouth daily. 90 tablet 3   gabapentin  (NEURONTIN ) 300 MG capsule Take 1 capsule (300 mg total) by mouth 3 (three) times daily. 270 capsule 1   levothyroxine  (SYNTHROID ) 100 MCG  tablet Take 1 tablet (100 mcg total) by mouth daily before breakfast. 90 tablet 1   mirabegron  ER (MYRBETRIQ ) 50 MG TB24 tablet Take 1 tablet (50 mg total) by mouth daily. 90 tablet 2   montelukast  (SINGULAIR ) 10 MG tablet Take 1 tablet (10 mg total) by mouth daily. 90 tablet 2   Multiple Vitamin (MULTIVITAMIN) tablet Take 1 tablet by mouth daily.     propranolol  ER (INDERAL  LA) 120 MG 24 hr capsule Take 1 capsule (120 mg total) by mouth daily. 90 capsule 2   SUMAtriptan  (IMITREX ) 100 MG tablet TAKE 1 TABLET FOR HEADACHE MAY REPEAT ONCE IN 2 HOURS IF NEEDED. MAXIMUM OF 2    TABLETS IN ONE DAY. (Patient taking differently: Take 100 mg by mouth  as needed for migraine or headache. TAKE 100 mg TABLET FOR HEADACHE MAY REPEAT ONCE IN 2 HOURS IF NEEDED. MAXIMUM OF 200 mg    TABLETS IN ONE DAY.) 27 tablet 3   No current facility-administered medications for this visit.    SURGICAL HISTORY:  Past Surgical History:  Procedure Laterality Date   BIOPSY  10/16/2018   Procedure: BIOPSY;  Surgeon: Teressa Toribio SQUIBB, MD;  Location: [facility] ENDOSCOPY;  Service: Endoscopy;;   BREAST REDUCTION SURGERY  1994   CERVICAL FUSION  2000   fusion c4-6   CERVICAL FUSION  2005   fusion c6-7 and plate removal r5-3   COLONOSCOPY     CYSTOSCOPY W/ RETROGRADES  10/21/2011   Procedure: CYSTOSCOPY WITH RETROGRADE PYELOGRAM;  Surgeon: Arlena LILLETTE Gal, MD;  Location: [facility];  Service: Urology;  Laterality: Right;  CYSTOSCOPY RIGHT RETROGRADE, PYELOGRAM  URETEROSCOPY WITH HOLMIUM LASER AND STONE EXTRACTION   CYSTOSCOPY/RETROGRADE/URETEROSCOPY/STONE EXTRACTION WITH BASKET  01/28/2011   right    CYSTOSCOPY/URETEROSCOPY/HOLMIUM LASER/STENT PLACEMENT Right 11/16/2021   Procedure: CYSTOSCOPY/RETROGRADE/URETEROSCOPY/HOLMIUM LASER/STENT PLACEMENT;  Surgeon: Selma Donnice SAUNDERS, MD;  Location: [facility] ORS;  Service: Urology;  Laterality: Right;   ESOPHAGOGASTRODUODENOSCOPY (EGD) WITH PROPOFOL  N/A 10/16/2018   Procedure: ESOPHAGOGASTRODUODENOSCOPY (EGD) WITH PROPOFOL ;  Surgeon: Teressa Toribio SQUIBB, MD;  Location: [facility] ENDOSCOPY;  Service: Endoscopy;  Laterality: N/A;   MOHS SURGERY  04/2023   REDUCTION MAMMAPLASTY Bilateral 1995   SPINE SURGERY  2001   2005   TONSILLECTOMY  1960   VAGINAL HYSTERECTOMY  1987   fibroids/anemia    REVIEW OF SYSTEMS:   Review of Systems  Constitutional: Negative for appetite change, chills, fatigue, fever and unexpected weight change.  HENT:   Negative for mouth sores, nosebleeds, sore throat and trouble swallowing.   Eyes: Negative for eye problems and icterus.  Respiratory: Negative for cough, hemoptysis, shortness of breath  and wheezing.   Cardiovascular: Negative for chest pain and leg swelling.  Gastrointestinal: Negative for abdominal pain, constipation, diarrhea, nausea and vomiting.  Genitourinary: Negative for bladder incontinence, difficulty urinating, dysuria, frequency and hematuria.   Musculoskeletal: Negative for back pain, gait problem, neck pain and neck stiffness.  Skin: Negative for itching and rash.  Neurological: Negative for dizziness, extremity weakness, gait problem, headaches, light-headedness and seizures.  Hematological: Negative for adenopathy. Does not bruise/bleed easily.  Psychiatric/Behavioral: Negative for confusion, depression and sleep disturbance. The patient is not nervous/anxious.     PHYSICAL EXAMINATION:  There were no vitals taken for this visit.  ECOG PERFORMANCE STATUS: 0  Physical Exam  Constitutional: Oriented to person, place, and time and well-developed, well-nourished, and in no distress.  HENT:  Head: Normocephalic and atraumatic.  Mouth/Throat: Oropharynx is clear and moist. No oropharyngeal  exudate.  Eyes: Conjunctivae are normal. Right eye exhibits no discharge. Left eye exhibits no discharge. No scleral icterus.  Neck: Normal range of motion. Neck supple.  Cardiovascular: Normal rate, regular rhythm, normal heart sounds and intact distal pulses.   Pulmonary/Chest: Effort normal and breath sounds normal. No respiratory distress. No wheezes. No rales.  Abdominal: Soft. Bowel sounds are normal. Exhibits no distension and no mass. There is no tenderness.  Musculoskeletal: Normal range of motion. Exhibits no edema.  Lymphadenopathy:    No cervical adenopathy.  Neurological: Alert and oriented to person, place, and time. Exhibits normal muscle tone. Gait normal. Coordination normal.  Skin: Skin is warm and dry. No rash noted. Not diaphoretic. No erythema. No pallor.  Psychiatric: Mood, memory and judgment normal.  Vitals reviewed.  LABORATORY DATA: Lab  Results  Component Value Date   WBC 8.4 05/06/2024   HGB 14.0 05/06/2024   HCT 42.2 05/06/2024   MCV 87.2 05/06/2024   PLT 449.0 (H) 05/06/2024      Chemistry      Component Value Date/Time   NA 140 05/06/2024 0728   K 4.0 05/06/2024 0728   CL 105 05/06/2024 0728   CO2 26 05/06/2024 0728   BUN 19 05/06/2024 0728   CREATININE 1.00 05/06/2024 0728   CREATININE 1.11 (H) 04/14/2024 0820   CREATININE 1.89 (H) 10/23/2018 1626      Component Value Date/Time   CALCIUM  9.6 05/06/2024 0728   ALKPHOS 74 05/06/2024 0728   AST 14 06/24/2024 0830   AST 17 04/14/2024 0820   ALT 10 06/24/2024 0830   ALT 11 04/14/2024 0820   BILITOT 0.7 05/06/2024 0728   BILITOT 0.5 04/14/2024 0820       RADIOGRAPHIC STUDIES:  No results found.   ASSESSMENT/PLAN:  This is a very pleasant 72 year old female referred to the clinic for thrombocytosis She had molecular studies that showed DDX 41 has been associated with leukemia and MDS.    Dr. Sherrod previously would not recommend starting her on any medication such as Hydrea for her elevated platelet count at this time unless her platelet count continues to elevate in the future.   However given that she is at high risk for leukemia or MDS, which she does not have any evidence of at this time, Dr. Sherrod does recommend close surveillance and follow-up with the patient.     Her CBC is otherwise normal besides the thrombocytosis. She has elevated at 512k today. I reviewed with Dr. Sherrod.    She will continue on observation with repeat labs in 6 months.    She will continue taking a baby aspirin.    The patient was wondering why she needs to come every 6 months. Discussed with her mutation of DDX41 can increase risk of MDS and Leukemia. I reviewed what labs would be concerning for conversion that we would need to see her back sooner for.   Her creatinine is slightly elevated today. She will hydrate well at home.    The patient was advised to  call immediately if she has any concerning symptoms in the interval. The patient voices understanding of current disease status and treatment options and is in agreement with the current care plan. All questions were answered. The patient knows to call the clinic with any problems, questions or concerns. We can certainly see the patient much sooner if necessary   No orders of the defined types were placed in this encounter.   The total time spent in  the appointment was 20-29 minutes  Frederika Hukill L Brevin Mcfadden, PA-C 10/07/24

## 2024-10-10 ENCOUNTER — Other Ambulatory Visit: Payer: Self-pay | Admitting: Family Medicine

## 2024-10-13 ENCOUNTER — Inpatient Hospital Stay: Attending: Physician Assistant

## 2024-10-13 ENCOUNTER — Inpatient Hospital Stay: Admitting: Physician Assistant

## 2024-10-13 ENCOUNTER — Encounter: Payer: Self-pay | Admitting: Family Medicine

## 2024-10-13 VITALS — BP 114/88 | HR 67 | Temp 97.9°F | Resp 18 | Ht 64.75 in | Wt 168.7 lb

## 2024-10-13 DIAGNOSIS — N951 Menopausal and female climacteric states: Secondary | ICD-10-CM | POA: Diagnosis not present

## 2024-10-13 DIAGNOSIS — D75839 Thrombocytosis, unspecified: Secondary | ICD-10-CM | POA: Diagnosis not present

## 2024-10-13 DIAGNOSIS — Z7982 Long term (current) use of aspirin: Secondary | ICD-10-CM | POA: Insufficient documentation

## 2024-10-13 DIAGNOSIS — Z79899 Other long term (current) drug therapy: Secondary | ICD-10-CM | POA: Insufficient documentation

## 2024-10-13 LAB — CBC WITH DIFFERENTIAL (CANCER CENTER ONLY)
Abs Immature Granulocytes: 0.02 K/uL (ref 0.00–0.07)
Basophils Absolute: 0.1 K/uL (ref 0.0–0.1)
Basophils Relative: 2 %
Eosinophils Absolute: 0.2 K/uL (ref 0.0–0.5)
Eosinophils Relative: 4 %
HCT: 41.7 % (ref 36.0–46.0)
Hemoglobin: 13.4 g/dL (ref 12.0–15.0)
Immature Granulocytes: 0 %
Lymphocytes Relative: 40 %
Lymphs Abs: 2.5 K/uL (ref 0.7–4.0)
MCH: 28.8 pg (ref 26.0–34.0)
MCHC: 32.1 g/dL (ref 30.0–36.0)
MCV: 89.5 fL (ref 80.0–100.0)
Monocytes Absolute: 0.5 K/uL (ref 0.1–1.0)
Monocytes Relative: 7 %
Neutro Abs: 3 K/uL (ref 1.7–7.7)
Neutrophils Relative %: 47 %
Platelet Count: 512 K/uL — ABNORMAL HIGH (ref 150–400)
RBC: 4.66 MIL/uL (ref 3.87–5.11)
RDW: 12.4 % (ref 11.5–15.5)
WBC Count: 6.4 K/uL (ref 4.0–10.5)
nRBC: 0 % (ref 0.0–0.2)

## 2024-10-13 LAB — CMP (CANCER CENTER ONLY)
ALT: 8 U/L (ref 0–44)
AST: 17 U/L (ref 15–41)
Albumin: 4.2 g/dL (ref 3.5–5.0)
Alkaline Phosphatase: 50 U/L (ref 38–126)
Anion gap: 7 (ref 5–15)
BUN: 20 mg/dL (ref 8–23)
CO2: 27 mmol/L (ref 22–32)
Calcium: 9.2 mg/dL (ref 8.9–10.3)
Chloride: 106 mmol/L (ref 98–111)
Creatinine: 1.37 mg/dL — ABNORMAL HIGH (ref 0.44–1.00)
GFR, Estimated: 41 mL/min — ABNORMAL LOW (ref >60)
Glucose, Bld: 101 mg/dL — ABNORMAL HIGH (ref 70–99)
Potassium: 4.8 mmol/L (ref 3.5–5.1)
Sodium: 140 mmol/L (ref 135–145)
Total Bilirubin: 0.6 mg/dL (ref 0.0–1.2)
Total Protein: 7.1 g/dL (ref 6.5–8.1)

## 2024-10-26 ENCOUNTER — Other Ambulatory Visit: Payer: Self-pay | Admitting: Family Medicine

## 2024-10-28 ENCOUNTER — Encounter: Payer: Self-pay | Admitting: Family Medicine

## 2024-12-14 ENCOUNTER — Other Ambulatory Visit: Payer: Self-pay | Admitting: Family Medicine

## 2024-12-29 ENCOUNTER — Encounter: Payer: Self-pay | Admitting: Family Medicine

## 2024-12-29 DIAGNOSIS — H6123 Impacted cerumen, bilateral: Secondary | ICD-10-CM

## 2025-01-03 DIAGNOSIS — H6120 Impacted cerumen, unspecified ear: Secondary | ICD-10-CM | POA: Insufficient documentation

## 2025-04-13 ENCOUNTER — Inpatient Hospital Stay

## 2025-04-13 ENCOUNTER — Inpatient Hospital Stay: Admitting: Internal Medicine

## 2025-05-09 ENCOUNTER — Other Ambulatory Visit

## 2025-05-12 ENCOUNTER — Ambulatory Visit

## 2025-05-13 ENCOUNTER — Ambulatory Visit

## 2025-05-16 ENCOUNTER — Encounter: Admitting: Family Medicine
